# Patient Record
Sex: Female | Born: 1935 | Race: White | Hispanic: No | Marital: Married | Smoking: Former smoker
Health system: Southern US, Community
[De-identification: ages and names within clinical notes are randomized; demographics above are authoritative.]

## PROBLEM LIST (undated history)

## (undated) DIAGNOSIS — J302 Other seasonal allergic rhinitis: Secondary | ICD-10-CM

## (undated) DIAGNOSIS — Z9289 Personal history of other medical treatment: Secondary | ICD-10-CM

## (undated) DIAGNOSIS — F32A Depression, unspecified: Secondary | ICD-10-CM

## (undated) DIAGNOSIS — E785 Hyperlipidemia, unspecified: Secondary | ICD-10-CM

## (undated) DIAGNOSIS — I209 Angina pectoris, unspecified: Secondary | ICD-10-CM

## (undated) DIAGNOSIS — R002 Palpitations: Secondary | ICD-10-CM

## (undated) DIAGNOSIS — M25519 Pain in unspecified shoulder: Secondary | ICD-10-CM

## (undated) DIAGNOSIS — F419 Anxiety disorder, unspecified: Secondary | ICD-10-CM

## (undated) DIAGNOSIS — G8929 Other chronic pain: Secondary | ICD-10-CM

## (undated) DIAGNOSIS — F329 Major depressive disorder, single episode, unspecified: Secondary | ICD-10-CM

## (undated) DIAGNOSIS — I10 Essential (primary) hypertension: Secondary | ICD-10-CM

## (undated) DIAGNOSIS — R55 Syncope and collapse: Secondary | ICD-10-CM

## (undated) DIAGNOSIS — G309 Alzheimer's disease, unspecified: Secondary | ICD-10-CM

## (undated) DIAGNOSIS — S42309A Unspecified fracture of shaft of humerus, unspecified arm, initial encounter for closed fracture: Secondary | ICD-10-CM

## (undated) DIAGNOSIS — F0280 Dementia in other diseases classified elsewhere without behavioral disturbance: Secondary | ICD-10-CM

## (undated) DIAGNOSIS — R413 Other amnesia: Secondary | ICD-10-CM

## (undated) DIAGNOSIS — I509 Heart failure, unspecified: Secondary | ICD-10-CM

## (undated) DIAGNOSIS — M1990 Unspecified osteoarthritis, unspecified site: Secondary | ICD-10-CM

## (undated) DIAGNOSIS — N200 Calculus of kidney: Secondary | ICD-10-CM

## (undated) DIAGNOSIS — I499 Cardiac arrhythmia, unspecified: Secondary | ICD-10-CM

## (undated) DIAGNOSIS — Z7901 Long term (current) use of anticoagulants: Secondary | ICD-10-CM

## (undated) DIAGNOSIS — I4891 Unspecified atrial fibrillation: Secondary | ICD-10-CM

## (undated) DIAGNOSIS — R319 Hematuria, unspecified: Secondary | ICD-10-CM

## (undated) DIAGNOSIS — M8580 Other specified disorders of bone density and structure, unspecified site: Secondary | ICD-10-CM

## (undated) HISTORY — DX: Dementia in other diseases classified elsewhere without behavioral disturbance: F02.80

## (undated) HISTORY — DX: Other amnesia: R41.3

## (undated) HISTORY — PX: BLADDER SUSPENSION: SHX72

## (undated) HISTORY — DX: Long term (current) use of anticoagulants: Z79.01

## (undated) HISTORY — DX: Other chronic pain: G89.29

## (undated) HISTORY — DX: Unspecified atrial fibrillation: I48.91

## (undated) HISTORY — DX: Pain in unspecified shoulder: M25.519

## (undated) HISTORY — DX: Hyperlipidemia, unspecified: E78.5

## (undated) HISTORY — PX: URETERAL STENT PLACEMENT: SHX822

## (undated) HISTORY — DX: Palpitations: R00.2

## (undated) HISTORY — PX: APPENDECTOMY: SHX54

## (undated) HISTORY — DX: Alzheimer's disease, unspecified: G30.9

## (undated) HISTORY — DX: Depression, unspecified: F32.A

## (undated) HISTORY — DX: Major depressive disorder, single episode, unspecified: F32.9

---

## 1998-05-31 ENCOUNTER — Other Ambulatory Visit: Admission: RE | Admit: 1998-05-31 | Discharge: 1998-05-31 | Payer: Self-pay | Admitting: Cardiology

## 1998-09-27 ENCOUNTER — Ambulatory Visit (HOSPITAL_COMMUNITY): Admission: RE | Admit: 1998-09-27 | Discharge: 1998-09-27 | Payer: Self-pay | Admitting: Gastroenterology

## 1999-05-30 ENCOUNTER — Other Ambulatory Visit: Admission: RE | Admit: 1999-05-30 | Discharge: 1999-05-30 | Payer: Self-pay | Admitting: Obstetrics & Gynecology

## 1999-06-02 ENCOUNTER — Other Ambulatory Visit: Admission: RE | Admit: 1999-06-02 | Discharge: 1999-06-02 | Payer: Self-pay | Admitting: Urology

## 2000-07-20 HISTORY — PX: MENISCUS REPAIR: SHX5179

## 2000-07-29 ENCOUNTER — Ambulatory Visit (HOSPITAL_BASED_OUTPATIENT_CLINIC_OR_DEPARTMENT_OTHER): Admission: RE | Admit: 2000-07-29 | Discharge: 2000-07-29 | Payer: Self-pay | Admitting: Orthopedic Surgery

## 2000-08-30 ENCOUNTER — Other Ambulatory Visit: Admission: RE | Admit: 2000-08-30 | Discharge: 2000-08-30 | Payer: Self-pay | Admitting: Obstetrics & Gynecology

## 2001-10-03 ENCOUNTER — Encounter: Payer: Self-pay | Admitting: Obstetrics & Gynecology

## 2001-10-03 ENCOUNTER — Ambulatory Visit (HOSPITAL_COMMUNITY): Admission: RE | Admit: 2001-10-03 | Discharge: 2001-10-03 | Payer: Self-pay | Admitting: Obstetrics & Gynecology

## 2001-10-10 ENCOUNTER — Emergency Department (HOSPITAL_COMMUNITY): Admission: EM | Admit: 2001-10-10 | Discharge: 2001-10-10 | Payer: Self-pay | Admitting: *Deleted

## 2002-10-29 ENCOUNTER — Other Ambulatory Visit: Admission: RE | Admit: 2002-10-29 | Discharge: 2002-10-29 | Payer: Self-pay | Admitting: Obstetrics & Gynecology

## 2003-04-07 ENCOUNTER — Ambulatory Visit (HOSPITAL_COMMUNITY): Admission: RE | Admit: 2003-04-07 | Discharge: 2003-04-07 | Payer: Self-pay | Admitting: Family Medicine

## 2003-04-07 ENCOUNTER — Encounter: Payer: Self-pay | Admitting: Family Medicine

## 2004-11-19 HISTORY — PX: CARDIOVERSION: SHX1299

## 2005-01-24 ENCOUNTER — Encounter: Admission: RE | Admit: 2005-01-24 | Discharge: 2005-01-24 | Payer: Self-pay | Admitting: Cardiology

## 2005-02-02 ENCOUNTER — Ambulatory Visit (HOSPITAL_COMMUNITY): Admission: RE | Admit: 2005-02-02 | Discharge: 2005-02-02 | Payer: Self-pay | Admitting: Cardiology

## 2005-11-19 HISTORY — PX: OTHER SURGICAL HISTORY: SHX169

## 2007-05-12 ENCOUNTER — Encounter: Admission: RE | Admit: 2007-05-12 | Discharge: 2007-05-12 | Payer: Self-pay | Admitting: Urology

## 2007-05-26 ENCOUNTER — Ambulatory Visit (HOSPITAL_COMMUNITY): Admission: RE | Admit: 2007-05-26 | Discharge: 2007-05-26 | Payer: Self-pay | Admitting: Urology

## 2007-05-26 ENCOUNTER — Encounter (INDEPENDENT_AMBULATORY_CARE_PROVIDER_SITE_OTHER): Payer: Self-pay | Admitting: Diagnostic Radiology

## 2007-09-22 ENCOUNTER — Emergency Department (HOSPITAL_COMMUNITY): Admission: EM | Admit: 2007-09-22 | Discharge: 2007-09-23 | Payer: Self-pay | Admitting: Emergency Medicine

## 2007-10-20 HISTORY — PX: TOTAL SHOULDER ARTHROPLASTY: SHX126

## 2007-10-27 ENCOUNTER — Inpatient Hospital Stay (HOSPITAL_COMMUNITY): Admission: RE | Admit: 2007-10-27 | Discharge: 2007-11-06 | Payer: Self-pay | Admitting: Orthopedic Surgery

## 2007-10-30 ENCOUNTER — Ambulatory Visit: Payer: Self-pay | Admitting: Physical Medicine & Rehabilitation

## 2007-11-03 ENCOUNTER — Ambulatory Visit: Payer: Self-pay | Admitting: *Deleted

## 2007-11-03 ENCOUNTER — Encounter (INDEPENDENT_AMBULATORY_CARE_PROVIDER_SITE_OTHER): Payer: Self-pay | Admitting: Orthopedic Surgery

## 2007-11-20 ENCOUNTER — Observation Stay (HOSPITAL_COMMUNITY): Admission: EM | Admit: 2007-11-20 | Discharge: 2007-11-22 | Payer: Self-pay | Admitting: *Deleted

## 2008-02-05 ENCOUNTER — Emergency Department (HOSPITAL_COMMUNITY): Admission: EM | Admit: 2008-02-05 | Discharge: 2008-02-05 | Payer: Self-pay | Admitting: Emergency Medicine

## 2008-04-22 IMAGING — CR DG SHOULDER 2+V PORT*R*
1 series · 1 of 1 positions shown · non-contrast
Comparison: 09/22/07.

CLINICAL DATA: Fracture/
 PORTABLE RIGHT SHOULDER ? 1 VIEW ? 10/27/07:

[AP]
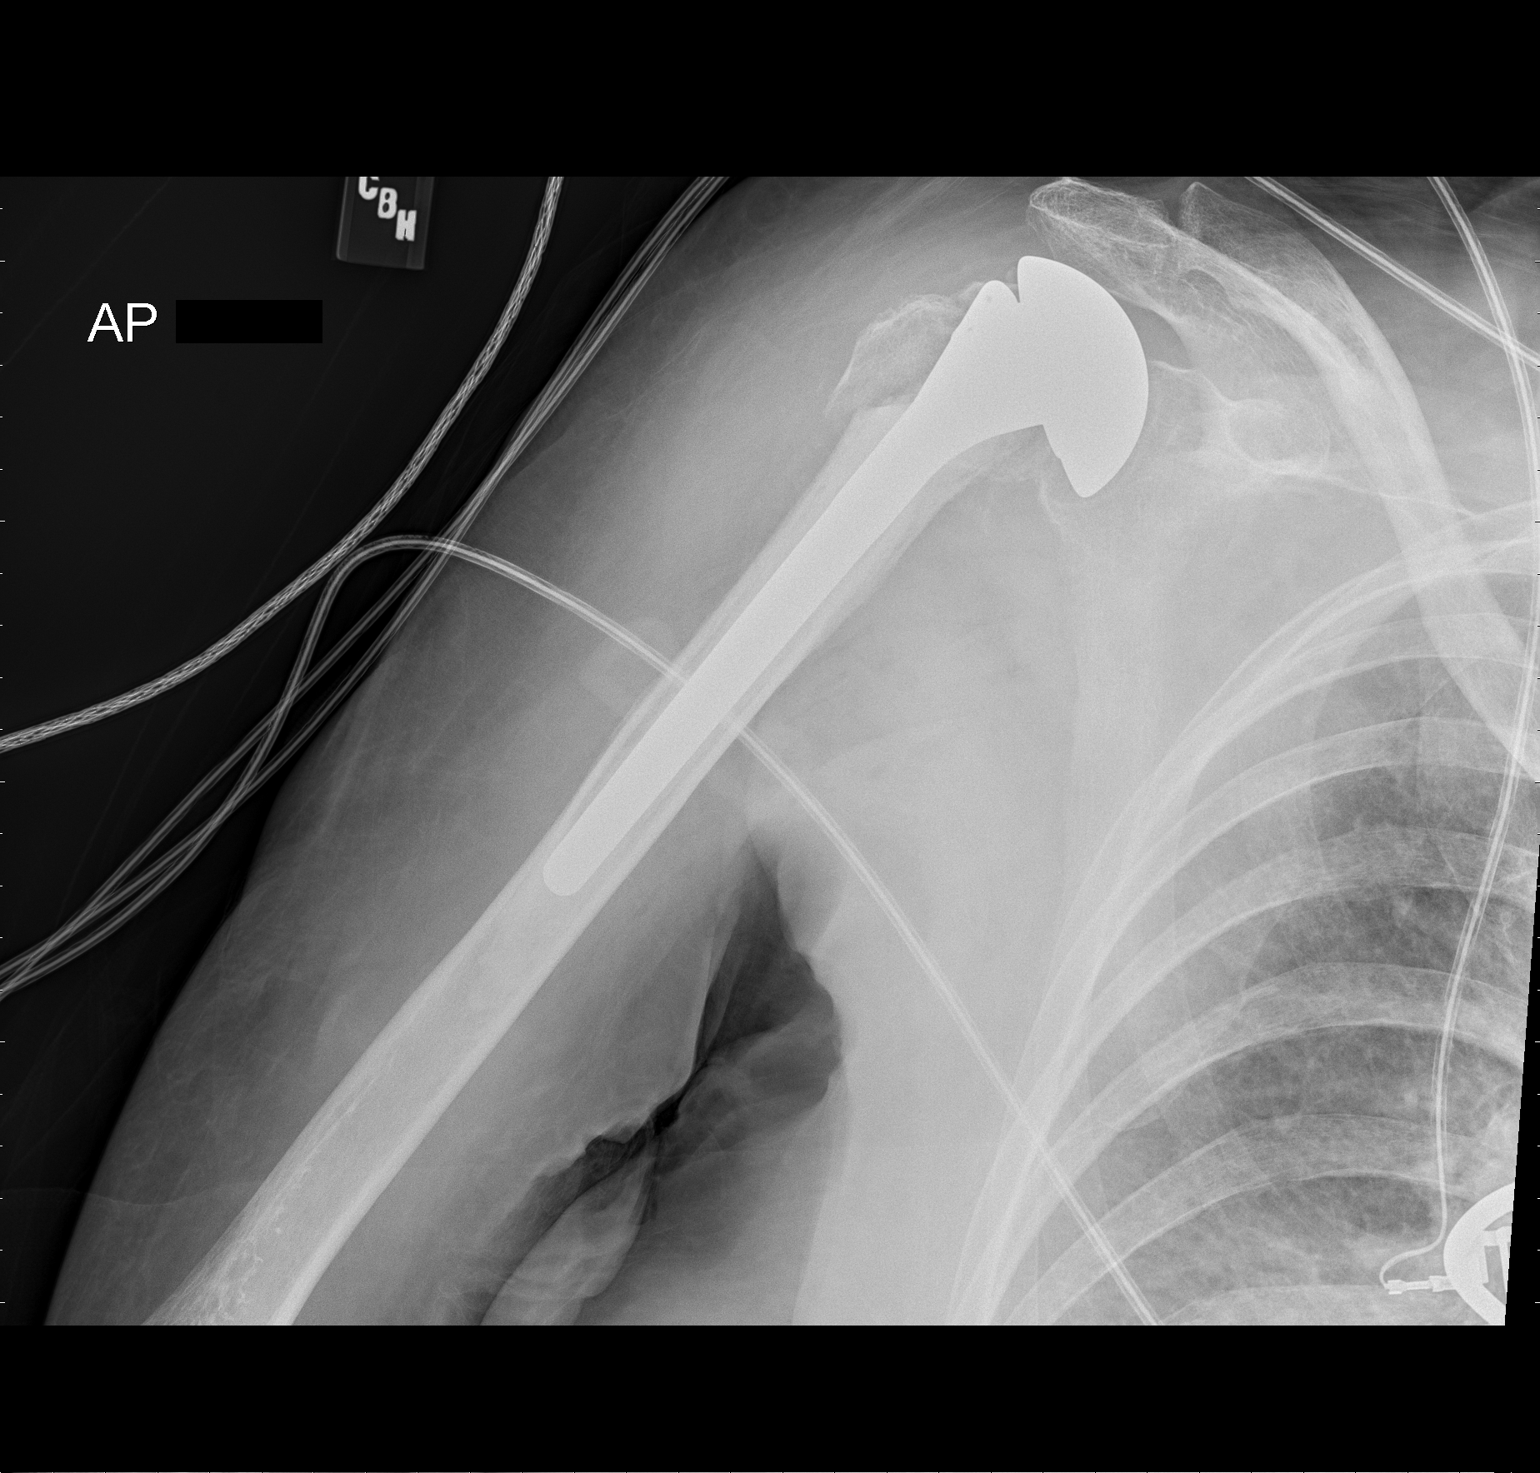

[1 of 1 positions shown; findings below may reference images not displayed]

FINDINGS: The patient has a new right shoulder arthroplasty.  No periprosthetic fracture is identified with fracture of the greater tuberosity again noted.  The device appears located on this AP view.  No evidence of post-procedural complication.
IMPRESSION: Right shoulder arthroplasty in place.

## 2009-11-19 HISTORY — PX: KNEE ARTHROSCOPY: SHX127

## 2010-06-28 ENCOUNTER — Ambulatory Visit: Payer: Self-pay | Admitting: Cardiology

## 2010-07-26 ENCOUNTER — Ambulatory Visit: Payer: Self-pay | Admitting: Cardiology

## 2010-08-28 ENCOUNTER — Ambulatory Visit: Payer: Self-pay | Admitting: Cardiology

## 2010-09-27 ENCOUNTER — Ambulatory Visit: Payer: Self-pay | Admitting: Cardiology

## 2010-10-26 ENCOUNTER — Ambulatory Visit: Payer: Self-pay | Admitting: Cardiology

## 2010-11-24 ENCOUNTER — Ambulatory Visit: Payer: Self-pay | Admitting: Cardiology

## 2010-12-21 ENCOUNTER — Encounter (INDEPENDENT_AMBULATORY_CARE_PROVIDER_SITE_OTHER): Payer: Medicare Other

## 2010-12-21 ENCOUNTER — Encounter: Payer: Self-pay | Admitting: Cardiology

## 2010-12-21 DIAGNOSIS — Z7901 Long term (current) use of anticoagulants: Secondary | ICD-10-CM

## 2010-12-21 DIAGNOSIS — I4891 Unspecified atrial fibrillation: Secondary | ICD-10-CM

## 2010-12-27 NOTE — Medication Information (Signed)
Summary: INR/2  Anticoagulant Therapy  Managed by: Bethena Midget, RN, BSN Referring MD: Patty Sermons MD Supervising MD: Jens Som MD, Arlys John Indication 1: Atrial Fibrillation Lab Used: [facility] [place] Site: [address] INR POC 3.0 INR RANGE 2.0-3.0  Dietary changes: no    Health status changes: no    Bleeding/hemorrhagic complications: no    Recent/future hospitalizations: no    Any changes in medication regimen? no    Recent/future dental: no  Any missed doses?: no       Is patient compliant with meds? yes      Comments: last INR at [facility] on 1/15 was 2.7. Pt takes 1 pill daily except 1/2 pill on 3 days.   Anticoagulation Management History:      The patient is taking warfarin and comes in today for a routine follow up visit.  Positive risk factors for bleeding include an age of 75 years or older.  The bleeding index is 'intermediate risk'.  Positive CHADS2 values include Age > 30 years old.  Anticoagulation responsible provider: Jens Som MD, Arlys John.  INR POC: 3.0.  Cuvette Lot#: [id number].  Exp: 11/2011.    Anticoagulation Management Assessment/Plan:      The next INR is due 01/18/2011.  Anticoagulation instructions were given to patient/daughter.  Results were reviewed/authorized by Bethena Midget, RN, BSN.  She was notified by Bethena Midget, RN, BSN.         Current Anticoagulation Instructions: INR 3.0 Continue 5mg s everyday except 2.5mg s on Tuesdays, Thursdays and Saturdays. Recheck in 4 weeks. Pt will call us if she wants to come here for checks or s/c at regular location at [facility]

## 2011-01-24 ENCOUNTER — Ambulatory Visit (INDEPENDENT_AMBULATORY_CARE_PROVIDER_SITE_OTHER): Payer: Medicare Other | Admitting: Cardiology

## 2011-01-24 DIAGNOSIS — Z7901 Long term (current) use of anticoagulants: Secondary | ICD-10-CM

## 2011-01-24 DIAGNOSIS — R079 Chest pain, unspecified: Secondary | ICD-10-CM

## 2011-01-24 DIAGNOSIS — I4891 Unspecified atrial fibrillation: Secondary | ICD-10-CM

## 2011-02-26 ENCOUNTER — Ambulatory Visit (INDEPENDENT_AMBULATORY_CARE_PROVIDER_SITE_OTHER): Payer: Medicare Other | Admitting: *Deleted

## 2011-02-26 DIAGNOSIS — I4891 Unspecified atrial fibrillation: Secondary | ICD-10-CM

## 2011-02-26 DIAGNOSIS — E7800 Pure hypercholesterolemia, unspecified: Secondary | ICD-10-CM

## 2011-02-26 DIAGNOSIS — Z7901 Long term (current) use of anticoagulants: Secondary | ICD-10-CM | POA: Insufficient documentation

## 2011-02-26 LAB — BASIC METABOLIC PANEL
BUN: 19 mg/dL (ref 6–23)
Creatinine, Ser: 0.9 mg/dL (ref 0.4–1.2)
GFR: 61.64 mL/min (ref >60.00)

## 2011-02-26 LAB — HEPATIC FUNCTION PANEL
ALT: 18 U/L (ref 0–35)
Albumin: 4.1 g/dL (ref 3.5–5.2)
Alkaline Phosphatase: 51 U/L (ref 39–117)
Total Protein: 6.7 g/dL (ref 6.0–8.3)

## 2011-02-26 LAB — LIPID PANEL
HDL: 74.2 mg/dL (ref >39.00)
LDL Cholesterol: 79 mg/dL (ref 0–99)
VLDL: 12.6 mg/dL (ref 0.0–40.0)

## 2011-02-28 ENCOUNTER — Telehealth: Payer: Self-pay | Admitting: *Deleted

## 2011-02-28 NOTE — Telephone Encounter (Signed)
Labs reported 

## 2011-03-09 ENCOUNTER — Ambulatory Visit (INDEPENDENT_AMBULATORY_CARE_PROVIDER_SITE_OTHER): Payer: Medicare Other | Admitting: *Deleted

## 2011-03-09 DIAGNOSIS — I4891 Unspecified atrial fibrillation: Secondary | ICD-10-CM

## 2011-03-09 DIAGNOSIS — Z7901 Long term (current) use of anticoagulants: Secondary | ICD-10-CM

## 2011-03-09 LAB — POCT INR: INR: 2.3

## 2011-03-22 ENCOUNTER — Encounter: Payer: Self-pay | Admitting: *Deleted

## 2011-03-23 ENCOUNTER — Encounter: Payer: Self-pay | Admitting: Cardiology

## 2011-03-23 ENCOUNTER — Ambulatory Visit (INDEPENDENT_AMBULATORY_CARE_PROVIDER_SITE_OTHER): Payer: Medicare Other | Admitting: Cardiology

## 2011-03-23 ENCOUNTER — Ambulatory Visit (INDEPENDENT_AMBULATORY_CARE_PROVIDER_SITE_OTHER): Payer: Medicare Other | Admitting: *Deleted

## 2011-03-23 VITALS — BP 112/70 | HR 80 | Wt 149.0 lb

## 2011-03-23 DIAGNOSIS — E7800 Pure hypercholesterolemia, unspecified: Secondary | ICD-10-CM | POA: Insufficient documentation

## 2011-03-23 DIAGNOSIS — I4510 Unspecified right bundle-branch block: Secondary | ICD-10-CM

## 2011-03-23 DIAGNOSIS — I4891 Unspecified atrial fibrillation: Secondary | ICD-10-CM

## 2011-03-23 DIAGNOSIS — R5381 Other malaise: Secondary | ICD-10-CM

## 2011-03-23 DIAGNOSIS — R5383 Other fatigue: Secondary | ICD-10-CM | POA: Insufficient documentation

## 2011-03-23 DIAGNOSIS — Z7901 Long term (current) use of anticoagulants: Secondary | ICD-10-CM

## 2011-03-23 LAB — CBC WITH DIFFERENTIAL/PLATELET
Eosinophils Absolute: 0.1 10*3/uL (ref 0.0–0.7)
Hemoglobin: 14.4 g/dL (ref 12.0–15.0)
Lymphocytes Relative: 28.9 % (ref 12.0–46.0)
Lymphs Abs: 1.4 10*3/uL (ref 0.7–4.0)
Monocytes Absolute: 0.5 10*3/uL (ref 0.1–1.0)
Monocytes Relative: 9.8 % (ref 3.0–12.0)
Neutro Abs: 2.9 10*3/uL (ref 1.4–7.7)
Neutrophils Relative %: 59.4 % (ref 43.0–77.0)
Platelets: 141 10*3/uL — ABNORMAL LOW (ref 150.0–400.0)
RBC: 4.62 Mil/uL (ref 3.87–5.11)
WBC: 4.9 10*3/uL (ref 4.5–10.5)

## 2011-03-23 LAB — TSH: TSH: 2.26 u[IU]/mL (ref 0.35–5.50)

## 2011-03-23 NOTE — Progress Notes (Signed)
Penny [name] Date of Birth:  [birth year] [facility] [address]   [address] [place], [place]  [address] [phone]           Fax   [phone]  History of Present Illness: This pleasant 77 year old woman is seen for a scheduled followup office visit.  We actually move her appointment up early because she was concerned about her symptoms of malaise and fatigue.  She has not had any localizing cause for the malaise and fatigue.  Her recent blood chemistries were okay but we did not check thyroid or CBC at that time and we will check those today for completeness.  She does not have a history of ischemic heart disease and she had a normal adenosine nuclear stress test on 01/28/08.  She had a two-dimensional echocardiogram on 04/13/09 which showed normal left ventricular systolic function with an ejection fraction of 55-60% and biatrial enlargement and mild aortic sclerosis and mild mitral regurgitation and moderate tricuspid regurgitation with normal pulmonary artery pressure.  Current Outpatient Prescriptions  Medication Sig Dispense Refill  . alendronate (FOSAMAX) 70 MG tablet Take 70 mg by mouth every 7 (seven) days. Take with a full glass of water on an empty stomach.       . Ascorbic Acid (VITAMIN C) 500 MG tablet Take 500 mg by mouth daily.        . calcium carbonate (OS-CAL) 600 MG TABS Take 600 mg by mouth 2 (two) times daily with a meal.        . Cholecalciferol (VITAMIN D) 2000 UNITS tablet Take 4,000 Units by mouth daily.       . diazepam (VALIUM) 5 MG tablet Take 5 mg by mouth as needed.        . meclizine (ANTIVERT) 25 MG tablet Take 25 mg by mouth as needed.        . metoprolol succinate (TOPROL-XL) 25 MG 24 hr tablet Take 12.5 mg by mouth daily.        . multivitamin (THERAGRAN) per tablet Take 1 tablet by mouth daily.        . Omega-3 Fatty Acids (FISH OIL PO) Take 400 mg by mouth daily.        [name] warfarin (COUMADIN) 5 MG tablet Take 5 mg by mouth. As  directed by the [facility]       . rosuvastatin (CRESTOR) 10 MG tablet Take 5 mg by mouth daily.          Allergies  Allergen Reactions  . Digoxin And Related   . Zocor (Simvastatin)   . Sulfa Antibiotics Rash    Patient Active Problem List  Diagnoses  . Atrial fibrillation  . Long term current use of anticoagulant  . Malaise and fatigue  . Right bundle branch block  . Hypercholesterolemia    History  Smoking status  . Former Smoker  Smokeless tobacco  . Not on file    History  Alcohol Use No    Family History  Problem Relation Age of Onset  . Heart disease Mother     Review of Systems: Constitutional: no fever chills diaphoresis or fatigue or change in weight.  Head and neck: no hearing loss, no epistaxis, no photophobia or visual disturbance. Respiratory: No cough, shortness of breath or wheezing. Cardiovascular: No chest pain peripheral edema, palpitations. Gastrointestinal: No abdominal distention, no abdominal pain, no change in bowel habits hematochezia or melena. Genitourinary: No dysuria, no frequency, no urgency, no nocturia.  Musculoskeletal:No arthralgias, no back pain, no gait disturbance or myalgias. Neurological: No dizziness, no headaches, no numbness, no seizures, no syncope, no weakness, no tremors. Hematologic: No lymphadenopathy, no easy bruising. Psychiatric: No confusion, no hallucinations, no sleep disturbance.    Physical Exam: Filed Vitals:   03/23/11 1103  BP: 112/70  Pulse: 80  The general appearance reveals a pleasant elderly woman in no acute distress.Pupils equal and reactive.   Extraocular Movements are full.  There is no scleral icterus.  The mouth and pharynx are normal.  The neck is supple.  The carotids reveal no bruits.  The jugular venous pressure is normal.  The thyroid is not enlarged.  There is no lymphadenopathy.The chest is clear to percussion and auscultation. There are no rales or rhonchi. Expansion of the chest is  symmetrical.  Heart reveals a soft apical systolic murmur.  The rhythm is irregular in atrial fibrillation.The abdomen is soft and nontender. Bowel sounds are normal. The liver and spleen are not enlarged. There Are no abdominal masses. There are no bruits.The pedal pulses are good.  There is no phlebitis or edema.  There is no cyanosis or clubbing.Strength is normal and symmetrical in all extremities.  There is no lateralizing weakness.  There are no sensory deficits.No muscle weakness or atrophy.  No muscle tenderness.The skin is warm and dry.  There is no rash.  EKG shows atrial fibrillation with controlled ventricular response and an old right bundle-branch block.  There are no ischemic changes.  Assessment / Plan: Continue present medication.  Await results of lab work.She will return in one month and in 2 months for protime and in 3 months for office visit and protime.

## 2011-03-23 NOTE — Assessment & Plan Note (Signed)
This patient has a long history of atrial fibrillation and is on Coumadin.  She has not been having any side effects from the Coumadin.  Her INR today is 2.2.  She has had some Exertional dyspnea and occasional left-sidedChest pain.  She does not have known coronary disease.  She had a normal nuclear stress test on 01/28/08.

## 2011-03-23 NOTE — Assessment & Plan Note (Signed)
The patient has been having leg cramps down around her ankle is bilaterally.  At the present time she is off her Crestor to see if the leg cramps will improve.  She has been off the Crestor for several weeks and so for there's been no real change.

## 2011-03-23 NOTE — Assessment & Plan Note (Signed)
Patient complains of lack of energy.  She feels tired and exhausted.  She has not been aware of any hematochezia or melena or anything to suggest anemia.  She's not having any fever or chills or night sweats per she does have occasional hot flashes.

## 2011-03-27 ENCOUNTER — Telehealth: Payer: Self-pay | Admitting: *Deleted

## 2011-03-27 NOTE — Telephone Encounter (Signed)
Lab results reported.

## 2011-04-03 NOTE — Op Note (Signed)
Penny [name], RAAP NO.:  [id number]   MEDICAL RECORD NO.:  [id number]          PATIENT TYPE:  INP   LOCATION:  5008                         FACILITY:  [facility]   PHYSICIAN:  Almedia Balls. [name], M.D. DATE OF BIRTH:  1929/07/24   DATE OF PROCEDURE:  10/27/2007  DATE OF DISCHARGE:                               OPERATIVE REPORT   PREOPERATIVE DIAGNOSIS:  Right shoulder displaced proximal humerus  fracture.   POSTOPERATIVE DIAGNOSES:  1. Right shoulder displaced proximal humerus fracture.  2. Chronic rotator cuff tear.   PROCEDURE PERFORMED:  Right shoulder hemiarthroplasty using DePuy Global  FX system with right shoulder rotator cuff repair.   ATTENDING SURGEON:  Penny [name], M.D.   ASSISTANT:  Konrad Felix Dixon, [place]   ANESTHESIA:  General anesthesia plus interscalene block anesthesia was  used.   ESTIMATED BLOOD LOSS:  200 mL.   FLUID REPLACEMENT:  1200 mL crystalloid and 500 mL Hespan.   INSTRUMENT COUNTS:  Correct.   COMPLICATIONS:  None.   ANTIBIOTICS:  Perioperative antibiotics were given.   INDICATIONS:  The patient is a 77 year old female with a history of fall  injuring her right shoulder.  The patient was injured about a month ago.  The patient initially had acceptable reduction of her proximal humerus  fracture.  The patient's over successive weeks lost reduction on the  proximal humerus, presents now with a displaced fracture requiring  operative reconstruction of her right shoulder having discussed with the  patient the need to reconstitute her proximal humeral anatomy of the  right shoulder.  She was consented for either an open reduction internal  fixation utilizing a nail plate device or a right shoulder  hemiarthroplasty.  Informed consent was obtained.   DESCRIPTION OF PROCEDURE:  After an adequate level of anesthesia  achieved, the patient was positioned in the modified beach-chair  position.  Grossly the patient's right shoulder  showed displacement of  her fracture with the humeral shaft anterior and tenting the skin.  Unfortunately even with the patient asleep, it appeared that this was a  fixed deformity and could not be manipulated with the patient asleep and  paralyzed.  It felt as if everything was moving as a unit.  Thus this  was likely healed at this point.  We then sterilely prepped and draped  the right shoulder to the wrist.  After sterile prep and drape we  entered the shoulder through an anterior deltopectoral approach.  We  started at the coracoid process and extended distally to the anterior  humeral shaft using a 10 blade scalpel.  Dissection carried sharply down  through subcutaneous tissue.  Cephalic vein was identified and taken  laterally with the deltoid muscle.  The pectoralis was taken medially.  The upper 0.5 cm pectoralis was released.  We encountered the anterior  humeral shaft, almost immediately and had to peel the deltoid off that  shaft as it had healed there.  There was also attritional ruptured noted  of the biceps tendon.  There was quite a bit of scarring and bleeding  noted during the approach.  Bleeding was controlled with Bovie  electrocautery.  The cephalic vein had also been scarred as well to the  distal humeral fragment.  During the removal of the deltoid off of the  humeral shaft, there was a fairly large branch of the cephalic vein that  was compromised and we had to go ahead and suture ligate the cephalic  vein proximally and distally.  At this point we took the conjoined  tendon medially.  We identified the proximal humeral fragment was healed  to the backside of the humeral shaft.  This had to be osteotomized using  a Cobb elevator and it was noted that following the osteotomy process,  the bone was quite soft and the shell of bone remaining for the proximal  humerus appeared to be quite thin.  My concern was that the purchase  from the SMP DePuy nail plate would be  poor in the proximal fragment  given the patient's 77 years of age and one month chronicity with this  injury it was felt that open reduction internal fixation would not be a  good option for [place] and we elected to proceed at this time with  hemiarthroplasty.  At this point we went ahead and identified the  proximal portion the biceps that had been attritionally torn and we  divided the soft tissue overlying the biceps groove.  We went ahead and  then osteotomized the lesser tuberosity off and then identified the  humeral head which was flipped 108 degrees and angled posteriorly,  placed a Crego elevator over the back of the articular cartilage to  identify the rotator cuff tendon insertion.  There appeared to be a  fairly large longitudinal split rotator cuff tear with some loss of  tendon in the supraspinatus area.  The infraspinatus and teres minor  appeared to be well attached to the posterior portion of the greater  tuberosity.  We went ahead and osteotomized the greater tuberosity.  We  basically had two pieces.  We had a small anterior piece with a split  longitudinal tear which was chronic in nature and then we had the  posterior piece with the infraspinatus and teres minor attached and went  ahead and performed a side-to-side rotator cuff repair using a #2  FiberWire suture effectively creating one large greater tuberosity piece  and rotator cuff tendon attachment.  We removed the humeral head and  sized to a size 44.  At this point we had a good look at the glenoid  face which appeared to be in good shape.  There was a posterior labral  tear which was fairly extensive and we went ahead and debrided that  sharply using a needle point Bovie careful to stay away from the  articular cartilage.  We then went ahead and did a biceps tenodesis of  the remaining 1 cm or so of biceps which was left attached to the  superior labrum.  Again this appears to have essentially torn because of   its lying over the top of the sharp end of the fractured humerus.  With  the glenoid in good shape and the remaining labrum stable, we went ahead  and looked for some extra posterior bone fragments which were found  attached to the posterior capsule, removed those sharply using Bovie and  at this point were ready to prepare the humerus.  We went ahead and used  hand reaming and reamed up to a size 10 with the Global FX system.  I  stuck the 12 reamer in.  This would not go down more than half way.  Thus we selected the 10 body and 44 x 18 head and I went ahead and  inserted that in the humeral shaft.  The patient felt fairly short with  contracture of the soft tissues but we were able to identify the  appropriate height for the humeral prosthesis which was going to be  three laser marks to the biceps groove bone with the anterior fin line  with that biceps groove.  Once we had done that we removed trial  components.  We did go ahead and irrigate the humeral shaft and placed a  dried sponge in the humeral shaft.  We next went ahead and selected our  real components and then placed total of three drill holes in the  humeral shaft.  One in the biceps floor the biceps groove and one medial  and one lateral to the biceps groove and placed two #2 FiberWire sutures  into this hole such that we had four suture strands coming out through  the holes in a mattress suture inside the humeral shaft.  These will be  used for humeral shaft to the tuberosity fixation.  We then placed  another suture around the prosthesis through the medial fin and then  went ahead and mixed our cement.  This was vacuum mixed DePuy one cement  and it was then mixed finally in the bowl and then rolled into a doughy  consistency and then placed down by hand the humeral shaft and then  packed in to pressurize.  We then inserted the humeral component with  the anterior fin aligned with the biceps groove to the third laser mark   and allowed the cement to harden.  Once this was hardened, we went ahead  and attached our humeral head component and impacted that into place.  This was 44 x 18 head and at this point we went ahead and placed our  available bone graft from the humeral head around the proximal humerus  both medial and lateral to the fin.  We placed the around-the-world  suture.  It was through the medial fin of the prosthesis, posterior to  the greater tuberosity and medial to the lesser tuberosity.  We had two  mattress sutures with modified W stitches in the lesser and greater  tuberosities respectively.  These were with the suture strands coming  out over the top of the bone portion, the tuberosity portion and the  sutures through the tendon.  We then repaired tuberosity to tuberosity  with the sutures bringing the tuberosities over the top of the bone  graft area.  We then went ahead and at this point tied our around-the-  world stitch and we placed our mattress sutures that were through the  shaft of the humerus we brought those sutures up through the biceps to  tenodese that and then also placed those sutures using a free needle  through the rotator cuff tendon and the subscap tendon.  We oversewed  the most distal portion of the rotator interval right where the  supraspinatus and subscapularis intersected to further stabilize the  area.  Once we had completed our repair, the entire tuberosity shaft  head construct moved as a unit with no impingement.  We thoroughly  irrigated and then closed with a layered closure using Vicryl and  subcuticular Monocryl.  Steri-Strips applied followed by a sterile  dressing.  The patient placed in  shoulder sling, having tolerated  surgery well.      Almedia Balls. [name], M.D.  Electronically Signed     SRN/MEDQ  D:  10/29/2007  T:  10/30/2007  Job:  [id number]   cc:   Molly Maduro A. Thurston Hole, M.D.

## 2011-04-03 NOTE — Discharge Summary (Signed)
Penny [name], Penny [name] NO.:  [id number]   MEDICAL RECORD NO.:  [id number]          PATIENT TYPE:  INP   LOCATION:  5008                         FACILITY:  [facility]   PHYSICIAN:  Almedia Balls. [name], M.D. DATE OF BIRTH:  1929-08-22   DATE OF ADMISSION:  10/27/2007  DATE OF DISCHARGE:  11/03/2007                               DISCHARGE SUMMARY   ADMISSION DIAGNOSIS:  Right proximal humerus fracture.   DISCHARGE DIAGNOSIS:  Right proximal humerus fracture status post  hemiarthroplasty, RSD, subtle wound infection to right elbow bursa.   BRIEF HISTORY:  The patient is a 77 year old female who sustained a fall  about 3-4 weeks prior to admission.  I was trying to treat her proximal  humerus fracture conservatively, but she had worsening symptoms and thus  elected to have surgical management.   PROCEDURE:  The patient had a right shoulder hemiarthroplasty on  October 27, 2007 by Dr. Malon Kindle.  Assistant was [employer].  No complications.  Estimated blood loss was minimal.   HOSPITAL COURSE:  The patient was admitted on October 27, 2007 for the  above-stated procedure which she tolerated well.  After adequate time in  the post-anesthesia care unit, she was transferred up to 5000.  The  patient was complaining about moderate pain with swelling to the right  upper extremity.  It was noted that she basically had some increase in  swelling and some signs of probable early RSD to that right upper  extremity.  We did discuss this was Dr. Oneita Kras, who is well  known for his treatment for RSD in the community, and he does agree that  this was going on prior to her admission and had been going on for  probably several weeks before surgery.  Due to her Coumadin, currently  she is not a candidate for stellate blocks and we are trying to treat it  with clonidine.  Unfortunately, clonidine is lower her blood pressure,  causing difficulty managing the signs and  symptoms.  The patient was  very slow to go with physical therapy.  She was able to complete some  activities, but thus a rehab bed was initiated for transfer.  The  patient and her family were in agreement with this plan.  She was kept  over the weekend for continued PT and OT, and to hopefully to get her to  a rehab facility today on November 03, 2007.  Currently, she is stable  and labs were on labs were within normal limits.   DISCHARGE/PLAN:  The patient will be transferred to rehab or a short-  term skilled nursing facility on November 03, 2007.   CONDITION:  Her condition is guarded.   DIET:  Regular.   ALLERGIES:  SULFA.   DISCHARGE MEDICATIONS:  1. Amiodarone 200 mg p.o. daily,  2. Ascorbic acid 250 mg p.o. daily.  3. Clonidine 0.1 mg 24-hour patch, but we are holding currently,  4. Lexapro 10 mg p.o. daily.  5. Ferrous sulfate 325 mg p.o. t.i.d.  6. Hydrocortisone topical applied t.i.d. to affected area.  7. Robaxin 500 mg p.o. q.8h.  8. Toprol 25 mg p.o. daily.  9. Zocor 20 mg p.o. daily.  10.Coumadin.  11.Valium 10 mg p.o. q.h.s. p.r.n.  12.Vicodin 1-2 tabs q.4-6h. p.r.n. pain.   FOLLOWUP:  The patient will follow back up with Dr. Malon Kindle in 2  weeks.      Thomas B. Durwin Nora, P.A.      Almedia Balls. [name], M.D.  Electronically Signed    TBD/MEDQ  D:  11/03/2007  T:  11/03/2007  Job:  [id number]

## 2011-04-03 NOTE — Consult Note (Signed)
NAME:  Penny [name], CLAVEL NO.:  [id number]   MEDICAL RECORD NO.:  [id number]          PATIENT TYPE:  INP   LOCATION:  2012                         FACILITY:  [facility]   PHYSICIAN:  Kari Baars, M.D.  DATE OF BIRTH:  1929-11-02   DATE OF CONSULTATION:  DATE OF DISCHARGE:                                 CONSULTATION   REFERRING PHYSICIANS:  Georga Hacking, M.D. and Cassell Clement,  M.D.   REASON FOR CONSULTATION:  Syncope with multiple new medications.  Please  assist with medications.   HISTORY OF PRESENT ILLNESS:  The patient is a 77 year old white female  with a history of atrial fibrillation on chronic anticoagulation, recent  nephrolithiasis treatment, and recent right shoulder fracture who  presented to the emergency department after slumping down while sitting  in a chair with loss of consciousness.  The patient has had a  complicated history recently including treatment for nephrolithiasis in  August 2008 at [facility].  At that  time she underwent bilateral ureteral stents which were removed 2 weeks  later.  She also had lithotripsy.  She had some postoperative fatigue  but recovered well from that until she fell at the beginning of December  and suffered a right shoulder fracture.  She states that she stripped on  the blacktop and had a mechanical fall and had right proximal humeral  fracture in 4 places.  This was treated conservatively initially by Dr.  Thurston Hole with persistent pain and development of RSV.  She was referred to  Dr. [name] for evaluation and underwent right hemiarthroplasty of the  EMS on October 27, 2007.  Postoperatively, she had excessive sedation  and difficulty awakening from the anesthesia.  Her hospitalization was  prolonged and complicated by a right olecranon pressure ulcer.  This was  treated with antibiotics.  She was discharged home and has been living  with her daughters.  She has been  more lethargic since discharge but has  been doing okay at home, eating and drinking okay.  Yesterday while  sitting in a chair, she slumped over and lost consciousness for about 2  minutes.  This was a witnessed event with no apparent seizure activity  or urinary or bowel incontinence.  Her daughter states that she may have  had some drooping of her left face.  She checked her pulse at that time  and it was irregular as it always is with rate in the 50s or 60s.   She awoke afterwards and was lethargic plus had improved overnight.  She  has had multiple recent medication changes including a decreased dose of  Toprol during her hospitalization.  Digoxin was recently added as well.  She has been on Neurontin for her right arm RSV which has been titrated  up to 300 mg t.i.d.  Her daughter states that she had a similar episode  last week at her other daughter's house which may have occurred right  after taking Neurontin.  In addition, she has had right upper extremity  sympathetic block on 2 occasions over the  past 3 weeks.  Her Coumadin  has been on hold for this procedure and she has been receiving Lovenox.   REVIEW OF SYSTEMS:  All systems reviewed on the patient are negative  except in the history of present illness the following exceptions:  Mild  headache.  No urinary symptoms, fever, chills or sweats, diarrhea.  Appetite is okay.  Appears no chest pain.   PAST MEDICAL HISTORY:  1. Atrial fibrillation with anticoagulation.  2. Nephrolithiasis status post bilateral ureteral stents (August 2008)      removed after 2 weeks.  3. Status post lithotripsy in August 2008 at [facility].  4. Hyperlipidemia.  5. Recent right proximal humeral fracture (December 2008) status post      right hemiarthroplasty (October 27, 2007) and right upper extremity      sympathetic block x2 in the past 3 weeks due to Reflex Sympathetic      Dystrophy .  6. Right olecranon pressure ulcer.  7.  Renal mass.  8. Postoperative anemia undergoing transfusion.  9. Status post total abdominal hysterectomy, bilateral salpingo-      oophorectomy in 1997.  10.Status post apneic events.  11.Status post knee surgery in 25-Apr-2000.   MEDICATIONS:  1. Amiodarone 200 mg, recently titrated to 100 mg daily.  2. Ascorbic acid 250 mg daily.  3. Lexapro 10 mg daily.  4. Toprol XL recently decreased from 25 to 12.5 mg daily.  5. Zocor 20 mg daily.  6. Coumadin on hold.  7. Lovenox 40 mg b.i.d..  8. Neurontin 300 mg t.i.d. recently increased.  9. Digoxin 0.125 mg daily recently started.   ALLERGIES/>  NO KNOWN DRUG ALLERGIES.   SOCIAL HISTORY:  She is married with 3 children and 5 grandchildren.  She is cared for by her daughter with whom she is staying.  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   FAMILY HISTORY:  Father died following a fall in 04/25/92.  Mother died of  heart disease in her 33s.   PHYSICAL EXAMINATION:  VITAL SIGNS:  Temperature 97.3, pulse 77,  respirations 18, oxygen saturation 96% on room air, blood pressure  95/66.  GENERAL:  She is fatigued, pale appearing in no acute distress.  HEENT:  Oropharynx is moist.  NECK:  Supple without lymphadenopathy, jugular venous distension or  carotid bruits.  HEART:  Irregularly irregular with no murmurs.  LUNGS:  Clear to auscultation bilaterally.  ABDOMEN:  Soft, nondistended, nontender with normoactive bowel sounds.  EXTREMITIES:  Right arm is in a sling with some mild acrocyanosis of the  hand.  She does have right hand tremor at rest.  Her right ankle is in a  brace.  She has a healed ulcer over her right olecranon bursa with  crusting and no erythema.  NEUROLOGIC:  No focal deficits.  Cranial nerves are intact.  No facial  droops are seen.  Right hand tremors as above.   LABORATORY STUDIES:  A CBC shows a white count of 6.3, hemoglobin 13.9,  platelets 169,000.  BMET significant for  sodium 141, calcium 4.2,  chloride 108, bicarbonate 22, BUN 10, creatinine 0.8, glucose 97.  Liver  function tests are normal.  TSH 2.9.  INR 1.  Also her Coumadin and  digoxin levels are 0.5 and urinalysis does show positive nitrites.   STUDIES:  Head CT shows atrophy with chronic microvascular changes and  no definite  acute abnormality .  The EKG shows atrial  fibrillation with  right bundle branch block, left anterior fascicular block (bifascicular  block) .   ASSESSMENT/FINDINGS:  __________ .      Kari Baars, M.D.  Electronically Signed     WS/MEDQ  D:  11/21/2007  T:  11/21/2007  Job:  [id number]   cc:   Cassell Clement, M.D.

## 2011-04-03 NOTE — Discharge Summary (Signed)
NAMEROSALITA, Penny [name] NO.:  [id number]   MEDICAL RECORD NO.:  [id number]          PATIENT TYPE:  INP   LOCATION:  2012                         FACILITY:  [facility]   PHYSICIAN:  Georga Hacking, M.D.DATE OF BIRTH:  1929-09-25   DATE OF ADMISSION:  11/20/2007  DATE OF DISCHARGE:  11/22/2007                               DISCHARGE SUMMARY   FINAL DIAGNOSES:  1. Unresponsiveness, probably medication related.      a.     Neurontin discontinued this admission.  2. Atrial fibrillation with slow response - amiodarone and Lanoxin      discontinued this admission.  3. Possible urinary tract infection treated with Cipro.  4. Recent shoulder injury with reflex sympathetic dystrophy.  5. Chronic anticoagulation therapy, currently on Lovenox.  6. History of kidney stones.  7. Hyperlipidemia under treatment.   CONSULTATIONS:  Kari Baars, M.D.   HISTORY:  A 77 year old female who has previous chronic atrial  fibrillation under rate control.  She had a right shoulder fracture  repaired surgically recently and has developed a reflex sympathetic  dystrophy.  She has been taken off of Coumadin and placed on Lovenox and  has been receiving sympathetic injections by Dr. Oneita Kras.  She  has also been started on Neurontin and has had this titrated up.  She  became more lethargic recently, has been staying between her two  daughters including one daughter who is a Company secretary.  She became  unresponsive today after taking her medications, was sitting and slumped  over and was assisted to the floor by her daughter.  She was  unresponsive and unable to speak.  Daughter did check her pulse and felt  her pulse was regular at the time but was unable to check blood pressure  and called 9-1-1.  She gradually became responsive but was still  lethargic when she was seen in the emergency room.  She denied any other  cardiac symptoms.  Please see the previously dictated history and  physical for remainder of the details.   HOSPITAL COURSE:  Laboratory data on admission showed a normal CBC,  normal PT and normal PTT.  D-dimer was 0.76 which was mildly high.  Sodium 141, potassium 4.2, chloride 108, CO2 22, glucose 97, BUN 10,  creatinine 0.79.  Cardiac enzymes were normal.  TSH was 0.9.  EKG showed  atrial fibrillation with controlled response.  Head CT was unremarkable.   The patient was admitted and she had no evidence of orthostasis.  Her  Neurontin was discontinued.  Dr. Clelia Croft saw her in consultation, wondered  whether she might have a urinary tract infection and she had multiple  organisms and treated with Cipro because of a recent urologic procedure.  He also suspected polypharmacy as a cause.  Dr. Elease Hashimoto saw her in Dr.  Yevonne Pax absence the next morning and stopped amiodarone.  She  continued to have a somewhat slow response and also stopped her digoxin.  She was seen by PT and OT who felt that she did not need further  inpatient services and she was discharged in  improved condition.  She is  to stop her Neurontin and digoxin.  She is also to not take pain  medicines or Valium on a regular basis, but may use them as needed.   DISCHARGE MEDICATIONS:  Her medications on discharge include extended  release metoprolol 25 mg daily, Lexapro 10 mg daily, Lovenox 40 mg twice  daily, Valium 5 mg as needed, hydrocodone as needed.   FOLLOWUP:  She is instructed to call Dr. Patty Sermons for followup in 1-2  weeks.  She is to see Dr. Clelia Croft in 2 weeks.  She is to take Cipro 500 mg  twice daily to complete a 3 day course.      Georga Hacking, M.D.  Electronically Signed     WST/MEDQ  D:  11/22/2007  T:  11/22/2007  Job:  [id number]   cc:   Cassell Clement, M.D.  Kari Baars, M.D.  Almedia Balls. [name], M.D.

## 2011-04-03 NOTE — H&P (Signed)
Penny [name], DULA NO.:  [id number]   MEDICAL RECORD NO.:  [id number]          PATIENT TYPE:  INP   LOCATION:  1825                         FACILITY:  [facility]   PHYSICIAN:  Georga Hacking, M.D.DATE OF BIRTH:  Oct 22, 1929   DATE OF ADMISSION:  11/20/2007  DATE OF DISCHARGE:                              HISTORY & PHYSICAL   REASON FOR ADMISSION:  Unresponsive.   HISTORY:  This 77 year old female has a previous history of chronic  atrial fibrillation.  She has a history of having had a cardioversion  several years ago, but went back into atrial fibrillation this summer.  She has been maintained on chronic atrial fibrillation with rate control  on Coumadin.  She had a right shoulder fracture and recently had repair  of the right shoulder by Dr. Malon Kindle.  She evidently had  significant lethargy following this and was slow to wake following  surgery and required a prolonged hospital stay.  She has evidently  developed reflex sympathetic dystrophy and has been treated by Dr. Oneita Kras.  She has been taken off Coumadin and been maintained on  Lovenox.  She has recently received sympathetic injections and had a  previous injection done just this past Tuesday.  She has been somewhat  more lethargic recently.  She has been staying between her two daughters  including one daughter, Augusto Gamble, who is a Company secretary.   She became unresponsive today after taking her medicines this morning  and she was sitting and then slumped over, and was assisted to the floor  by her daughter.  She was unresponsive, unable to speak.  Daughter did  check her pulse and felt that her pulse was regular at the time, was  unable to check the blood pressure, called 911.  Over a period of  several minutes, the patient gradually became responsive, but is still  somewhat lethargic.  She had recently been started on Neurontin the past  2 weeks and has had escalating doses of it.  She has never  had syncope  previously.  When she broke her shoulder, she tripped and fell on a  curb.  She has no history of coronary artery disease or chest pain and  denies any shortness of breath.   PAST HISTORY:  Negative for ulcers, diabetes or hypertension.  She does  have a previous history of hyperlipidemia that is under treatment  currently.   PAST SURGICAL HISTORY:  She has had a right total knee replacement,  hysterectomy.  She has had bladder tack up and has also had bilateral  ureteral stents for stones.   ALLERGIES:  Sulfa.   CURRENT MEDICATIONS:  Neurontin 300 t.i.d., digoxin 0.125 daily,  metoprolol 25 mg daily, amiodarone 100 mg daily,  Lexapro 10 mg daily.  She previously was on Valium 5 q.h.s., hydrocodone acetaminophen 375 mg  daily as well as Darvocet, but has not been using any of these recently.   SOCIAL HISTORY:  She is a nonsmoker, nondrinker.  Married, currently  staying with one of her daughters.   FAMILY HISTORY:  Noncontributory.  REVIEW OF SYSTEMS:  Her weight has been stable.  She has a previous  history of cataract surgery.  She says that she tends to keep her eyes  closed a lot.  She has no difficulty hearing.  She has not had any  difficulty swallowing.  Has chronic constipation.  No ulcers or GI  bleeding or dyspepsia.  She has had previous kidney stones and has had  some urinary frequency in the past.  She has had some mild chronic  arthritis.  No history of stroke or TIA.  Other than as noted above, the  remainder of review of systems is unremarkable.   PHYSICAL EXAMINATION:  GENERAL:  She is an elderly woman who is mildly  lethargic, lying on the stretcher with her eyes partially closed.  VITAL SIGNS:  Blood pressure 130/60, pulse 65 and irregular.  SKIN:  Warm and dry.  There is ecchymosis present around her shoulder as  well as her left fourth finger.  ENT:  EOMI.  PERLA.  Sinus clear.  Fundi not examined.  Pharynx  negative.  NECK:  Supple without  masses, JVD, thyromegaly, or bruits.  LUNGS:  Clear to A&P.  CARDIAC:  Normal S1 and S2.  No S3 or murmur.  ABDOMEN:  Soft, nontender.  EXTREMITIES:  Her right foot is in a splint.  There is trace peripheral  edema noted.   DATABASE:  Laboratory data shows normal CBC and chemistry panel.  EKG  shows a right bundle branch block and atrial fibrillation.   IMPRESSION:  1. Unresponsive.  It is atypical for cardiac causes.  It could be due      to low blood pressure or could be medication-induced.  2. Recent diagnosis of reflex sympathetic dystrophy receiving      sympathetic blocks as well as Neurontin.  3. Previous right shoulder surgery.  4. Atrial fibrillation, chronic, rate controlled.   RECOMMENDATIONS:  She will have orthostatic blood pressures.  She will  be admitted and will stop her Neurontin for the time being.  I will get  an internal medicine consult and ask Dr. [name] to reevaluate her.  She  will also have a PT and an OT consult.  Dr. Patty Sermons will follow.      Georga Hacking, M.D.  Electronically Signed     WST/MEDQ  D:  11/20/2007  T:  11/20/2007  Job:  [id number]   cc:   Almedia Balls. [name], M.D.  Cassell Clement, M.D.  Kari Baars, M.D.

## 2011-04-03 NOTE — Discharge Summary (Signed)
Penny [name], [name] NO.:  [id number]   MEDICAL RECORD NO.:  [id number]          PATIENT TYPE:  INP   LOCATION:  5008                         FACILITY:  [facility]   PHYSICIAN:  Almedia Balls. [name], M.D. DATE OF BIRTH:  08-07-1929   DATE OF ADMISSION:  10/27/2007  DATE OF DISCHARGE:  11/06/2007                               DISCHARGE SUMMARY   ADMISSION DIAGNOSES:  1. Right shoulder proximal humerus fracture, displaced.  2. Pressure ulcer right olecranon.  3. Pressure sore right olecranon process.   DISCHARGE DIAGNOSES:  1. Right shoulder displaced proximal humerus fracture.  2. Reflex sympathetic dystrophy right upper extremity  3. Pressure ulcer right olecranon.   PROCEDURE PERFORMED:  Right shoulder hemiarthroplasty using [employer]  FX system that was performed on October 27, 2007 by Dr. [name].   HISTORY OF PRESENT ILLNESS:  The patient is a 77 year old female who is  about a month out from a right proximal humerus fracture.  This is a  displaced fracture which was treated initially with closed treatment and  no surgery.  Unfortunately over the past several weeks, the fractures  displaced requiring operative treatment.  Dr. Hadassah Pais, who had  management the patient nonoperatively for 1 month, requested my  assistance in caring for this fracture.  The patient's pain was getting  worse and her function was worsening despite conservative means.  She  now presents for operative treatment. For further details on the  patient's past medical history and physical examination, please see the  medical record.   HOSPITAL COURSE:  The patient admitted to the orthopedic surgery service  and taken to surgery where on October 27, 2007 she had uncomplicated  right shoulder hemiarthroplasty.  Postoperatively the patient was noted  to have some redness around her olecranon process pressure sore.  This  was treated with IV antibiotics, IV Ancef until her date of  discharge  with resolution of her erythema and swelling. It was also treated  locally with triple antibiotic ointment and a Mepilex dressing which be  done until the wound is fully sealed. Additionally the patient was noted  to be developing worsening RSD symptoms in the right upper extremity  which were noticed 3 days after surgery. She did have a right extremity  Doppler ultrasound to rule out a blood clot.  She also has a diagnosis  of atrial fibrillation and is followed by Dr. Ronny Flurry for the A  fib and was placed on Coumadin.  However, the Coumadin was stopped prior  to discharge pending right upper extremity sympathetic blockade which  will be done by Dr. Oneita Kras. I have discussed this with her  anesthesiology and the hospital and they are unable to perform the  sympathetic blockade in the hospital.  Thus the patient was discharged  to home under the care of her family.  She was seen by PT and OT and up  and moving well about the hallways prior to leaving. Again she has a  fairly significant right upper extremity RSD which will be treated with  outpatient blocks.  I will  be managing her dystrophy as an outpatient.  We will be seeing her back in the office in 1 week for follow-up check.  She is discharged home on her preadmission medications as well as pain  medication and muscle relaxer.  She will be  applying the triple antibiotic ointment to the right elbow with a  Mepilex dressing daily. She does not have to do any dressing changes for  the right shoulder and can get that wet in the shower. I will be seeing  her back in the office in a week.  She will also be getting home health  PT and OT for assistance.      Almedia Balls. [name], M.D.  Electronically Signed     SRN/MEDQ  D:  11/05/2007  T:  11/06/2007  Job:  [id number]

## 2011-04-06 ENCOUNTER — Encounter: Payer: Medicare Other | Admitting: *Deleted

## 2011-04-06 NOTE — Op Note (Signed)
NAMECARLOTTA, Penny [name] NO.:  [id number]   MEDICAL RECORD NO.:  [id number]          PATIENT TYPE:  OIB   LOCATION:  2852                         FACILITY:  [facility]   PHYSICIAN:  Cassell Clement, M.D. DATE OF BIRTH:  Aug 07, 1929   DATE OF PROCEDURE:  02/02/2005  DATE OF DISCHARGE:                                 OPERATIVE REPORT   OPERATION:  DC cardioversion.   HISTORY:  This 77 year old woman has a history of recent onset of atrial  fibrillation.  She has failed to convert on outpatient measures and is  brought in for outpatient cardioversion at this time.   After intravenous Pentothal 100 mg IV anesthesia by Dr. Diamantina Monks,  the  patient was given a single 100 joule shock using the anterior posterior pads  and converted promptly to normal sinus rhythm.  She tolerated the procedure  well and there were no immediate post anesthetic complications.      TB/MEDQ  D:  02/02/2005  T:  02/02/2005  Job:  [id number]   cc:   Maren Beach, M.D.  [address] [address]  [place]  [place] [address]  Fax: [phone]

## 2011-04-06 NOTE — Op Note (Signed)
[place]  Patient:    Penny [name], Penny [name]                     MRN: [id number] Proc. Date: 07/29/00 Adm. Date:  [id number] Attending:  Twana First                           Operative Report  PREOPERATIVE DIAGNOSIS:  Left knee medial meniscus tear.  POSTOPERATIVE DIAGNOSES: 1. Left knee medial and lateral meniscal tears. 2. Left knee tricompartmental grade 3 chondromalacia.  PROCEDURE: 1. Left knee examination under anesthesia followed by arthroscopic partial    medial and lateral meniscectomies. 2. Left knee tricompartmental chondroplasty.  SURGEON:  Elana Alm. Thurston Hole, M.D.  ASSISTANT:  Arlys John D. Petrarca, P.A.-C.  ANESTHESIA:  Local and MAC.  OPERATIVE TIME:  30 minutes.  COMPLICATIONS:  None.  INDICATIONS FOR PROCEDURE:  Ms. Overbaugh is a 77 year old woman who has had six months of increasing left knee pain with signs and symptoms consistent with medial meniscus tear, that has failed conservative care and is now to undergo arthroscopy.  DESCRIPTION OF PROCEDURE:  Ms. Sobieski was brought to the operating room on July 29, 2000, after a block had been placed in the holding room.  She was placed on the operating table in the supine position.  Her left knee was examined under anesthesia, range of motion at 0-125 degrees, 1-2+ crepitation, , a stable ligamentous exam with normal patellar tracking.  The left leg was prepped with the use of sterile Betadine and draped using sterile technique. Originally through an inferolateral portal, the arthroscope with the pump attached was placed in through an inferomedial portal, and an arthroscopic probe was placed.  On initial inspection of the medial compartment, she was found to have 50-75% grade 3 chondromalacia of the medial femoral condyle and medial tibial plateau, which was thoroughly debrided down to a stable base and a stable rim.  Medial meniscus tear noted, posterior medial horn, of  which 30-40% was resected back to a stable rim.  Intercondylar notch inspected. Anterior and posterior cruciate ligaments were found to be normal.  Lateral compartment inspected.  Articular cartilage of the lateral femoral condyle showed a 30% grade 3 chondral defect, which was debrided.  The lateral tibial plateau showed mild grade 1 and 2 changes.  The lateral meniscus was probed, and she had a posterolateral corner tear, of which 30% was resected back to a stable rim.  Patellofemoral joint showed grade 3 chondromalacia over 75% of the femoral groove and patella, which was thoroughly debrided.  The patella tracked normally.  Moderate synovitis in the medial and lateral gutters was debrided. Otherwise, they were free of pathology.  After this was done, it was felt that all pathology had been satisfactorily addressed.  The instruments were removed.  The portals were closed with 3-0 nylon suture and injected with 0.25% Marcaine with epinephrine and 5 mg of morphine.  A sterile dressing was applied and the patient awakened and taken to the recovery room in stable condition.  FOLLOW-UP CARE:  Ms. Kram will be followed as an outpatient on Vicodin and Celebrex.  She will be back in the office in a week for sutures out and follow-up. DD:  07/29/00 TD:  07/29/00 Job: [id number]

## 2011-04-10 ENCOUNTER — Ambulatory Visit (INDEPENDENT_AMBULATORY_CARE_PROVIDER_SITE_OTHER): Payer: Medicare Other | Admitting: *Deleted

## 2011-04-10 DIAGNOSIS — Z7901 Long term (current) use of anticoagulants: Secondary | ICD-10-CM

## 2011-04-10 DIAGNOSIS — I4891 Unspecified atrial fibrillation: Secondary | ICD-10-CM

## 2011-04-13 ENCOUNTER — Telehealth: Payer: Self-pay | Admitting: Cardiology

## 2011-04-13 NOTE — Telephone Encounter (Signed)
Advised ok to take together per Dr. Patty Sermons.

## 2011-04-13 NOTE — Telephone Encounter (Signed)
PT HAD ANOTHER MD PUT HER ON CYMBALTA, PT IS ON COUMADIN. CALL BACK AT [phone].

## 2011-04-24 ENCOUNTER — Encounter: Payer: Medicare Other | Admitting: *Deleted

## 2011-04-25 ENCOUNTER — Other Ambulatory Visit: Payer: Self-pay | Admitting: Cardiology

## 2011-04-25 DIAGNOSIS — I4891 Unspecified atrial fibrillation: Secondary | ICD-10-CM

## 2011-04-25 MED ORDER — WARFARIN SODIUM 5 MG PO TABS: 5.0000 mg | ORAL_TABLET | Freq: Every day | ORAL | Status: DC

## 2011-04-25 NOTE — Telephone Encounter (Signed)
On the personal side and does not want to tell me why she  Wants Penny [name] to call her back!

## 2011-04-25 NOTE — Telephone Encounter (Signed)
Refilled coumadin per pt request

## 2011-05-01 ENCOUNTER — Encounter: Payer: Medicare Other | Admitting: *Deleted

## 2011-05-04 ENCOUNTER — Ambulatory Visit (INDEPENDENT_AMBULATORY_CARE_PROVIDER_SITE_OTHER): Payer: Medicare Other | Admitting: *Deleted

## 2011-05-04 DIAGNOSIS — I4891 Unspecified atrial fibrillation: Secondary | ICD-10-CM

## 2011-05-04 DIAGNOSIS — Z7901 Long term (current) use of anticoagulants: Secondary | ICD-10-CM

## 2011-05-07 ENCOUNTER — Other Ambulatory Visit: Payer: Self-pay | Admitting: Cardiology

## 2011-05-07 DIAGNOSIS — F419 Anxiety disorder, unspecified: Secondary | ICD-10-CM

## 2011-05-07 NOTE — Telephone Encounter (Signed)
Per patient request

## 2011-05-07 NOTE — Telephone Encounter (Signed)
Patient needs refill of Diazapam script [phone].

## 2011-05-12 MED ORDER — DIAZEPAM 5 MG PO TABS: 5.0000 mg | ORAL_TABLET | ORAL | Status: DC | PRN

## 2011-05-22 ENCOUNTER — Encounter: Payer: Medicare Other | Admitting: *Deleted

## 2011-05-28 ENCOUNTER — Other Ambulatory Visit: Payer: Self-pay | Admitting: Cardiology

## 2011-05-29 ENCOUNTER — Other Ambulatory Visit: Payer: Self-pay | Admitting: *Deleted

## 2011-05-29 MED ORDER — ROSUVASTATIN CALCIUM 10 MG PO TABS: 5.0000 mg | ORAL_TABLET | Freq: Every day | ORAL | Status: DC

## 2011-05-29 NOTE — Telephone Encounter (Signed)
Fax received from pharmacy. Refill completed. Jodette Gearl Baratta RN  

## 2011-05-31 ENCOUNTER — Ambulatory Visit (INDEPENDENT_AMBULATORY_CARE_PROVIDER_SITE_OTHER): Payer: Medicare Other | Admitting: *Deleted

## 2011-05-31 DIAGNOSIS — I4891 Unspecified atrial fibrillation: Secondary | ICD-10-CM

## 2011-05-31 DIAGNOSIS — Z7901 Long term (current) use of anticoagulants: Secondary | ICD-10-CM

## 2011-06-11 ENCOUNTER — Ambulatory Visit (INDEPENDENT_AMBULATORY_CARE_PROVIDER_SITE_OTHER): Payer: Medicare Other | Admitting: *Deleted

## 2011-06-11 DIAGNOSIS — I4891 Unspecified atrial fibrillation: Secondary | ICD-10-CM

## 2011-06-11 DIAGNOSIS — Z7901 Long term (current) use of anticoagulants: Secondary | ICD-10-CM

## 2011-06-11 LAB — POCT INR: INR: 2.3

## 2011-06-19 ENCOUNTER — Telehealth: Payer: Self-pay | Admitting: Cardiology

## 2011-06-19 NOTE — Telephone Encounter (Signed)
recvd call from [facility] at dr. Clelia Croft office requesting last ov and coumadin notes, they will start following patient coumadin.  Please fax to [phone]. Attn Dr Martha Clan.

## 2011-06-22 ENCOUNTER — Telehealth: Payer: Self-pay | Admitting: Cardiology

## 2011-06-22 NOTE — Telephone Encounter (Signed)
Resent

## 2011-06-22 NOTE — Telephone Encounter (Signed)
recvd fax from Selena Batten at [facility] requesting page 2 and 3 of 06/11/2011 anti coag visit.

## 2011-06-22 NOTE — Telephone Encounter (Signed)
Verified with Natasha Bence, gave request to Amy, no chart found, waiting to hear back

## 2011-06-25 NOTE — Telephone Encounter (Signed)
This information was faxed to PCP office.

## 2011-06-27 ENCOUNTER — Encounter: Payer: Medicare Other | Admitting: *Deleted

## 2011-06-27 ENCOUNTER — Encounter: Payer: Self-pay | Admitting: Cardiology

## 2011-06-27 ENCOUNTER — Ambulatory Visit (INDEPENDENT_AMBULATORY_CARE_PROVIDER_SITE_OTHER): Payer: Medicare Other | Admitting: Cardiology

## 2011-06-27 DIAGNOSIS — Z7901 Long term (current) use of anticoagulants: Secondary | ICD-10-CM

## 2011-06-27 DIAGNOSIS — I4891 Unspecified atrial fibrillation: Secondary | ICD-10-CM

## 2011-06-27 DIAGNOSIS — R42 Dizziness and giddiness: Secondary | ICD-10-CM

## 2011-06-27 NOTE — Assessment & Plan Note (Signed)
The patient has had no new symptoms referable to her atrial fibrillation.  She denies any symptoms to suggest TIA or stroke.  She's not having any symptoms of congestive heart failure

## 2011-06-27 NOTE — Assessment & Plan Note (Signed)
Patient denies any symptoms of the hematochezia or melena or blood loss from her anticoagulation.

## 2011-06-27 NOTE — Progress Notes (Signed)
Ashok Cordia Jia Date of Birth:  25-Feb-1929 [facility] / [facility] [address]   [address] [place], [place]  [address] [phone]           Fax   [phone]  HPI: This pleasant 77 year old woman is seen for a scheduled followup office visit.  She has a history of established atrial fibrillation.  He has been on long-term Coumadin.  Her last echocardiogram was 04/13/09 showing normal left ventricular systolic function and no wall motion abnormalities and biatrial enlargement.  She has mild mitral regurgitation and normal pulmonary artery pressure.  She does not have any history of ischemic heart disease.  She had a normal nuclear stress test on 01/28/08.  She does have a known right bundle-branch block on her EKG.  Her ejection fraction by nuclear scan was 61%.  Recently she's been experiencing some occasional dizziness.  No chest pain or angina.  No symptoms of congestive heart failure.  Current Outpatient Prescriptions  Medication Sig Dispense Refill  . Ascorbic Acid (VITAMIN C) 500 MG tablet Take 500 mg by mouth daily.        . calcium carbonate (OS-CAL) 600 MG TABS Take 600 mg by mouth 2 (two) times daily with a meal.        . Cholecalciferol (VITAMIN D) 2000 UNITS tablet Take 4,000 Units by mouth daily.       . diazepam (VALIUM) 5 MG tablet Take 1 tablet (5 mg total) by mouth as needed.  30 tablet  5  . meclizine (ANTIVERT) 25 MG tablet Take 25 mg by mouth as needed.        . metoprolol succinate (TOPROL-XL) 25 MG 24 hr tablet Take 12.5 mg by mouth daily.        . multivitamin (THERAGRAN) per tablet Take 1 tablet by mouth daily.        . Omega-3 Fatty Acids (FISH OIL PO) Take 400 mg by mouth daily.        . rosuvastatin (CRESTOR) 10 MG tablet Take 0.5 tablets (5 mg total) by mouth daily. Or as directed by physician  90 tablet  1  . warfarin (COUMADIN) 5 MG tablet Take 1 tablet (5 mg total) by mouth daily. As directed by the Coumadin Clinic  90 tablet  3    Allergies    Allergen Reactions  . Digoxin And Related   . Zocor (Simvastatin)   . Sulfa Antibiotics Rash    Patient Active Problem List  Diagnoses  . Atrial fibrillation  . Long term current use of anticoagulant  . Malaise and fatigue  . Right bundle branch block  . Hypercholesterolemia    History  Smoking status  . Former Smoker  Smokeless tobacco  . Not on file    History  Alcohol Use No    Family History  Problem Relation Age of Onset  . Heart disease Mother     Review of Systems: The patient denies any heat or cold intolerance.  No weight gain or weight loss.  The patient denies headaches or blurry vision.  There is no cough or sputum production.  The patient denies dizziness.  There is no hematuria or hematochezia.  The patient denies any muscle aches or arthritis.  The patient denies any rash.  The patient denies frequent falling or instability.  There is no history of depression or anxiety.  All other systems were reviewed and are negative.   Physical Exam: Filed Vitals:   06/27/11 1124  BP:  100/65  Pulse: 80  The general appearance feels a well-developed well-nourished woman in no distress.Pupils equal and reactive.   Extraocular Movements are full.  There is no scleral icterus.  The mouth and pharynx are normal.  The neck is supple.  The carotids reveal no bruits.  The jugular venous pressure is normal.  The thyroid is not enlarged.  There is no lymphadenopathy.  The chest is clear to percussion and auscultation.  Heart reveals no murmur gallop rub or click.  The abdomen is soft and nontender.  The extremities show no phlebitis or edema.  Neurologic exam is physiologic.  The patient does have limitation of motion of her right shoulder from previous surgical repair.        Assessment / Plan: Continue same medication.  Increase dietary salt to help support blood pressure better and decrease her occasional symptoms of dizziness.  Recheck in 3 months for followup office  visit

## 2011-07-09 ENCOUNTER — Encounter: Payer: Self-pay | Admitting: *Deleted

## 2011-07-10 ENCOUNTER — Emergency Department (HOSPITAL_COMMUNITY): Payer: Medicare Other

## 2011-07-10 ENCOUNTER — Inpatient Hospital Stay (HOSPITAL_COMMUNITY)
Admission: EM | Admit: 2011-07-10 | Discharge: 2011-07-11 | DRG: 690 | Disposition: A | Discharge status: Home / Self Care | Payer: Medicare Other | Source: Ambulatory Visit | Attending: Internal Medicine | Admitting: Internal Medicine

## 2011-07-10 DIAGNOSIS — I4891 Unspecified atrial fibrillation: Secondary | ICD-10-CM | POA: Diagnosis present

## 2011-07-10 DIAGNOSIS — R5381 Other malaise: Secondary | ICD-10-CM | POA: Diagnosis present

## 2011-07-10 DIAGNOSIS — Z882 Allergy status to sulfonamides status: Secondary | ICD-10-CM

## 2011-07-10 DIAGNOSIS — E7800 Pure hypercholesterolemia, unspecified: Secondary | ICD-10-CM | POA: Diagnosis present

## 2011-07-10 DIAGNOSIS — R0789 Other chest pain: Secondary | ICD-10-CM | POA: Diagnosis present

## 2011-07-10 DIAGNOSIS — N289 Disorder of kidney and ureter, unspecified: Secondary | ICD-10-CM | POA: Diagnosis present

## 2011-07-10 DIAGNOSIS — Z79899 Other long term (current) drug therapy: Secondary | ICD-10-CM

## 2011-07-10 DIAGNOSIS — E785 Hyperlipidemia, unspecified: Secondary | ICD-10-CM | POA: Diagnosis present

## 2011-07-10 DIAGNOSIS — F329 Major depressive disorder, single episode, unspecified: Secondary | ICD-10-CM | POA: Diagnosis present

## 2011-07-10 DIAGNOSIS — H8110 Benign paroxysmal vertigo, unspecified ear: Secondary | ICD-10-CM | POA: Diagnosis present

## 2011-07-10 DIAGNOSIS — I10 Essential (primary) hypertension: Secondary | ICD-10-CM | POA: Diagnosis present

## 2011-07-10 DIAGNOSIS — I4510 Unspecified right bundle-branch block: Secondary | ICD-10-CM | POA: Diagnosis present

## 2011-07-10 DIAGNOSIS — M810 Age-related osteoporosis without current pathological fracture: Secondary | ICD-10-CM | POA: Diagnosis present

## 2011-07-10 DIAGNOSIS — R350 Frequency of micturition: Secondary | ICD-10-CM | POA: Diagnosis present

## 2011-07-10 DIAGNOSIS — J309 Allergic rhinitis, unspecified: Secondary | ICD-10-CM | POA: Diagnosis present

## 2011-07-10 DIAGNOSIS — G3184 Mild cognitive impairment, so stated: Secondary | ICD-10-CM | POA: Diagnosis present

## 2011-07-10 DIAGNOSIS — Z96659 Presence of unspecified artificial knee joint: Secondary | ICD-10-CM

## 2011-07-10 DIAGNOSIS — Z7901 Long term (current) use of anticoagulants: Secondary | ICD-10-CM

## 2011-07-10 DIAGNOSIS — I517 Cardiomegaly: Secondary | ICD-10-CM | POA: Diagnosis present

## 2011-07-10 DIAGNOSIS — N390 Urinary tract infection, site not specified: Principal | ICD-10-CM | POA: Diagnosis present

## 2011-07-10 LAB — URINE MICROSCOPIC-ADD ON

## 2011-07-10 LAB — DIFFERENTIAL
Basophils Absolute: 0 10*3/uL (ref 0.0–0.1)
Basophils Relative: 0 % (ref 0–1)
Eosinophils Absolute: 0 10*3/uL (ref 0.0–0.7)
Eosinophils Relative: 0 % (ref 0–5)
Lymphocytes Relative: 11 % — ABNORMAL LOW (ref 12–46)
Monocytes Absolute: 0.6 10*3/uL (ref 0.1–1.0)

## 2011-07-10 LAB — GLUCOSE, CAPILLARY: Glucose-Capillary: 81 mg/dL (ref 70–99)

## 2011-07-10 LAB — URINALYSIS, ROUTINE W REFLEX MICROSCOPIC
Leukocytes, UA: NEGATIVE
Nitrite: NEGATIVE
Specific Gravity, Urine: 1.013 (ref 1.005–1.030)
Urobilinogen, UA: 1 mg/dL (ref 0.0–1.0)
pH: 7.5 (ref 5.0–8.0)

## 2011-07-10 LAB — CBC
MCHC: 35 g/dL (ref 30.0–36.0)
Platelets: 131 10*3/uL — ABNORMAL LOW (ref 150–400)
RDW: 13.5 % (ref 11.5–15.5)
WBC: 9 10*3/uL (ref 4.0–10.5)

## 2011-07-10 LAB — POCT I-STAT TROPONIN I: Troponin i, poc: 0.01 ng/mL (ref 0.00–0.08)

## 2011-07-10 LAB — POCT I-STAT, CHEM 8
BUN: 18 mg/dL (ref 6–23)
Calcium, Ion: 1.21 mmol/L (ref 1.12–1.32)
HCT: 39 % (ref 36.0–46.0)
Hemoglobin: 13.3 g/dL (ref 12.0–15.0)
Sodium: 144 mEq/L (ref 135–145)
TCO2: 28 mmol/L (ref 0–100)

## 2011-07-10 LAB — PROTIME-INR
INR: 1.79 — ABNORMAL HIGH (ref 0.00–1.49)
Prothrombin Time: 21.1 seconds — ABNORMAL HIGH (ref 11.6–15.2)

## 2011-07-11 ENCOUNTER — Other Ambulatory Visit (HOSPITAL_COMMUNITY): Payer: Self-pay

## 2011-07-11 ENCOUNTER — Observation Stay (HOSPITAL_COMMUNITY): Payer: Medicare Other

## 2011-07-11 LAB — COMPREHENSIVE METABOLIC PANEL
ALT: 125 U/L — ABNORMAL HIGH (ref 0–35)
AST: 322 U/L — ABNORMAL HIGH (ref 0–37)
Alkaline Phosphatase: 95 U/L (ref 39–117)
Calcium: 9.4 mg/dL (ref 8.4–10.5)
GFR calc Af Amer: >60 mL/min (ref >60)
Glucose, Bld: 79 mg/dL (ref 70–99)
Potassium: 3 mEq/L — ABNORMAL LOW (ref 3.5–5.1)
Sodium: 143 mEq/L (ref 135–145)
Total Protein: 5.9 g/dL — ABNORMAL LOW (ref 6.0–8.3)

## 2011-07-11 LAB — CBC
HCT: 35.9 % — ABNORMAL LOW (ref 36.0–46.0)
MCV: 89.5 fL (ref 78.0–100.0)
Platelets: 141 10*3/uL — ABNORMAL LOW (ref 150–400)
RBC: 4.01 MIL/uL (ref 3.87–5.11)
WBC: 4.9 10*3/uL (ref 4.0–10.5)

## 2011-07-11 LAB — CARDIAC PANEL(CRET KIN+CKTOT+MB+TROPI)
CK, MB: 2.4 ng/mL (ref 0.3–4.0)
CK, MB: 2.4 ng/mL (ref 0.3–4.0)
Relative Index: INVALID (ref 0.0–2.5)
Relative Index: INVALID (ref 0.0–2.5)
Relative Index: INVALID (ref 0.0–2.5)
Total CK: 56 U/L (ref 7–177)
Troponin I: <0.3 ng/mL (ref <0.30)

## 2011-07-11 LAB — PROTIME-INR: INR: 2.19 — ABNORMAL HIGH (ref 0.00–1.49)

## 2011-07-11 MED ORDER — IOHEXOL 300 MG/ML  SOLN: 80.0000 mL | Freq: Once | INTRAMUSCULAR | Status: DC | PRN

## 2011-07-11 MED ORDER — IOHEXOL 300 MG/ML  SOLN
80.0000 mL | Freq: Once | INTRAMUSCULAR | Status: AC | PRN
  Administered 2011-07-11: 80 mL via INTRAVENOUS

## 2011-07-17 LAB — CULTURE, BLOOD (ROUTINE X 2)
Culture  Setup Time: 201208221011
Culture: NO GROWTH

## 2011-07-19 ENCOUNTER — Encounter: Payer: Self-pay | Admitting: Cardiology

## 2011-07-19 ENCOUNTER — Ambulatory Visit (INDEPENDENT_AMBULATORY_CARE_PROVIDER_SITE_OTHER): Payer: Medicare Other | Admitting: Cardiology

## 2011-07-19 VITALS — BP 110/68 | HR 92 | Wt 139.0 lb

## 2011-07-19 DIAGNOSIS — R5381 Other malaise: Secondary | ICD-10-CM

## 2011-07-19 DIAGNOSIS — Z7901 Long term (current) use of anticoagulants: Secondary | ICD-10-CM

## 2011-07-19 DIAGNOSIS — I499 Cardiac arrhythmia, unspecified: Secondary | ICD-10-CM

## 2011-07-19 DIAGNOSIS — I4891 Unspecified atrial fibrillation: Secondary | ICD-10-CM

## 2011-07-19 NOTE — Patient Instructions (Signed)
We will order 2D echo. 

## 2011-07-19 NOTE — Assessment & Plan Note (Signed)
The patient has been on long-term Coumadin.  She has not been experiencing any evidence of occult blood loss.  Her hemoglobin is normal

## 2011-07-19 NOTE — Progress Notes (Signed)
Penny [name] Date of Birth:  07/28/1929 [facility] / [facility] [address]   [address] [place], [place]  [address] [phone]           Fax   [phone]  History of Present Illness: This 77 year old Caucasian female is seen as a work in office visit.  Her husband and her daughter who is also a Engineer, civil (consulting) at [employer] came with her today.  The patient had asked to be seen because of concern about her heart not feeling right.  She had recently been admitted to  on 07/11/11 with shaking chills but no fever and was found to have a urinary tract infection which was treated.  On admission her liver function studies were markedly elevated and her Crestor which she had been on for several years was stopped.  Followup blood work done at Dr. Alver Fisher office earlier this week showed return of liver function studies normal.  The patient did not have any symptoms to suggest gallbladder colic at the time of her presentation to [facility].  Her cardiac enzymes did not show any evidence of myocardial injury.  The patient has a history of chronic atrial fibrillation and has been on long-term Coumadin she also has a history of known right bundle-branch block which is also chronic.  The patient has had some problems with her memory and she is not clear on a lot of the details of her recent illnesses.  Current Outpatient Prescriptions  Medication Sig Dispense Refill  . Ascorbic Acid (VITAMIN C) 500 MG tablet Take 500 mg by mouth daily.        . calcium carbonate (OS-CAL) 600 MG TABS Take 600 mg by mouth 2 (two) times daily with a meal.        . Cholecalciferol (VITAMIN D) 2000 UNITS tablet Take 4,000 Units by mouth daily.       . Cyanocobalamin (VITAMIN B12 PO) Take by mouth. Taking 500 daily       . diazepam (VALIUM) 5 MG tablet Take 1 tablet (5 mg total) by mouth as needed.  30 tablet  5  . docusate sodium (COLACE) 100 MG capsule Take 100 mg by mouth 2 (two) times daily. As needed       .  donepezil (ARICEPT) 10 MG tablet Take 10 mg by mouth at bedtime as needed.        . loratadine (CLARITIN) 10 MG tablet Take 10 mg by mouth daily. As needed       . meclizine (ANTIVERT) 25 MG tablet Take 25 mg by mouth as needed.        . metoprolol succinate (TOPROL-XL) 25 MG 24 hr tablet Take 12.5 mg by mouth daily.        . multivitamin (THERAGRAN) per tablet Take 1 tablet by mouth daily.        . Omega-3 Fatty Acids (FISH OIL PO) Take 400 mg by mouth daily. Taking 1400 daily      . rosuvastatin (CRESTOR) 10 MG tablet Take 0.5 tablets (5 mg total) by mouth daily. Or as directed by physician  90 tablet  1  . warfarin (COUMADIN) 5 MG tablet Take 1 tablet (5 mg total) by mouth daily. As directed by the Coumadin Clinic  90 tablet  3    Allergies  Allergen Reactions  . Digoxin And Related   . Zocor (Simvastatin)   . Sulfa Antibiotics Rash    Patient Active Problem List  Diagnoses  . Atrial  fibrillation  . Long term current use of anticoagulant  . Malaise and fatigue  . Right bundle branch block  . Hypercholesterolemia  . Dizziness - light-headed    History  Smoking status  . Former Smoker  Smokeless tobacco  . Not on file    History  Alcohol Use No    Family History  Problem Relation Age of Onset  . Heart disease Mother     Review of Systems: Constitutional: no fever chills diaphoresis or fatigue or change in weight.  Head and neck: no hearing loss, no epistaxis, no photophobia or visual disturbance. Respiratory: No cough, shortness of breath or wheezing. Cardiovascular: No chest pain peripheral edema, palpitations. Gastrointestinal: No abdominal distention, no abdominal pain, no change in bowel habits hematochezia or melena. Genitourinary: No dysuria, no frequency, no urgency, no nocturia. Musculoskeletal:No arthralgias, no back pain, no gait disturbance or myalgias. Neurological: No dizziness, no headaches, no numbness, no seizures, no syncope, no weakness, no  tremors. Hematologic: No lymphadenopathy, no easy bruising. Psychiatric: No confusion, no hallucinations, no sleep disturbance.    Physical Exam: Filed Vitals:   07/19/11 1520  BP: 110/68  Pulse: 92  The general appearance reveals an elderly woman in no acute distress.  The skin is warm and dry.Pupils equal and reactive.   Extraocular Movements are full.  There is no scleral icterus.  The mouth and pharynx are normal.  The neck is supple.  The carotids reveal no bruits.  The jugular venous pressure is normal.  The thyroid is not enlarged.  There is no lymphadenopathy.  The chest is clear to percussion and auscultation. There are no rales or rhonchi. Expansion of the chest is symmetrical.  The precordium is quiet.  The first heart sound is normal.  The second heart sound is physiologically split.  There is no murmur gallop rub or click.  There is no abnormal lift or heave.The rhythm is irregular  The abdomen is soft and nontender. Bowel sounds are normal. The liver and spleen are not enlarged. There Are no abdominal masses. There are no bruits. The pedal pulses are good.  There is no phlebitis or edema.  There is no cyanosis or clubbing.  Strength is normal and symmetrical in all extremities.  There is no lateralizing weakness.  There are no sensory deficits.She does have some decline in recent memory    Assessment / Plan: At this point we will make any change in her medications.  She has been started back on her Crestor by Dr. Clelia Croft who will be rechecking her blood work again in September.  We are going to schedule an up-to-dateOf her echocardiogram.Recheck here at her regular visit or sooner p.r.n.

## 2011-07-19 NOTE — Assessment & Plan Note (Signed)
Today her heart rhythm by EKG is atrial fibrillation with a controlled ventricular response and a heart rate of 92.  She is on a small dose of Toprol 12.5 mg daily.  Because she has been complaining so much about malaise and fatigue and no energy I am inclined not to increase her blocker dose any further.  One could consider adding low dose digoxin to her regimen but in the past she had not done well on digoxin because of excessive bradycardia.

## 2011-07-19 NOTE — Assessment & Plan Note (Signed)
The patient has been complaining of malaise and fatigue and no energy.  Dr. Clelia Croft has worked this up.  The patient is not anemic.  Her hemoglobin at the time of her admission to [facility] last week was normal.  The family was asking whether her thyroid had been checked and I told Them that I was fairly sure that Dr. Clelia Croft would have checked at as part of his evaluation of her fatigue

## 2011-07-20 ENCOUNTER — Ambulatory Visit (HOSPITAL_COMMUNITY): Payer: Medicare Other | Attending: Cardiology | Admitting: Radiology

## 2011-07-20 DIAGNOSIS — R5383 Other fatigue: Secondary | ICD-10-CM | POA: Insufficient documentation

## 2011-07-20 DIAGNOSIS — I080 Rheumatic disorders of both mitral and aortic valves: Secondary | ICD-10-CM | POA: Insufficient documentation

## 2011-07-20 DIAGNOSIS — I379 Nonrheumatic pulmonary valve disorder, unspecified: Secondary | ICD-10-CM | POA: Insufficient documentation

## 2011-07-20 DIAGNOSIS — I079 Rheumatic tricuspid valve disease, unspecified: Secondary | ICD-10-CM | POA: Insufficient documentation

## 2011-07-20 DIAGNOSIS — I4891 Unspecified atrial fibrillation: Secondary | ICD-10-CM

## 2011-07-20 DIAGNOSIS — R42 Dizziness and giddiness: Secondary | ICD-10-CM | POA: Insufficient documentation

## 2011-07-20 DIAGNOSIS — R5381 Other malaise: Secondary | ICD-10-CM | POA: Insufficient documentation

## 2011-07-23 NOTE — H&P (Signed)
[name], [name]:  [id number]  MEDICAL RECORD NO.:  [id number]  LOCATION:  04-12-2028                         FACILITY:  [facility]  PHYSICIAN:  Penny Pounds, MD       DATE OF BIRTH:  Aug 18, 1929  DATE OF ADMISSION:  07/10/2011 DATE OF DISCHARGE:                             HISTORY & PHYSICAL   PRIMARY CARE PROVIDER:  Dr. Clelia Croft.  PRIMARY CARDIOLOGIST:  Cassell Clement, MD  CHIEF COMPLAINT:  Chest pain x2, freezing chills, and rigors.  HISTORY OF PRESENT ILLNESS:  This is an 77 year old female with complain of chest pain x1 yesterday lasting a few seconds in the middle of chest associated with cold, clammy, diaphoresis and nausea.  Chest pain recurred today with the same symptoms and she took an aspirin and vomited x1.  She had no hematemesis.  She called 911 and was brought to the ER.  Evaluation in the ER was normal.  Blood pressure was normal. Temp and saturations were normal.  Chest pain is gone and she reports feeling back to her baseline and wants to go home.  She actually is currently under several blankets.  Dr. Ethelda Chick called and asked me to admit her because she had shaking chills and rigors and was very ill, almost as if she is bacteremic.  She is certainly not septic.  IV fluids have been started and workup is ensued.  PAST MEDICAL HISTORY: 1. Allergic rhinitis. 2. AFib, on chronic Coumadin therapy. 3. Chronic fatigue. 4. Leg cramps. 5. Mild cognitive decline. 6. Hypernatremia. 7. Benign paroxysmal positional vertigo. 8. History major depression. 9. Hypertension. 10.Hyperlipidemia. 11.Osteoporosis. 12.History of shingles. 13.History of left kidney mass.  Biopsy negative in 04/13/07, stable on     CT. 14.History of right nephrolithiasis. 15.History of right shoulder fracture. 16.History of right total knee replacement. 17.History of hysterectomy. 18.History of bladder tack operation. 19.History of ureteral stents for stones. 20.Status  post lithotripsy in August 2008 at [facility]. 21.History of proximal humeral fracture, status post right     hemiarthroplasty in 13-Apr-2007. 22.History of right upper extremity sympathetic block x2 in the past     due to reflex sympathetic dystrophy. 23.History of right olecranon pressure ulcer.  SOCIAL HISTORY:  She is married.  Her husband is currently undergoing treatment for bladder cancer.  She has got three children and five grandchildren.  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  ALLERGIES:  SULFA.  MEDICATION LIST: 1. Centrum Silver 1 daily. 2. Toprol-XL 50 mg 1/2 in the morning. 3. Coumadin 5 mg 4 times per week, 2.5 mg 3 times per week. 4. Crestor 10 mg 1/2 daily. 5. Fosamax 70 mg weekly. 6. Benazepril 10 daily. 7. Vitamin D3 1000 daily. 8. Vitamin C daily. 9. Fish oil 1400 daily. 10.Meclizine 25 p.r.n. 11.Cymbalta 60 daily.  FAMILY HISTORY:  Father died following a fall in Apr 12, 1992.  Mother died of heart disease in her 1s.  REVIEW OF SYSTEMS:  Recent thick phlegm, seen Dr. Corinda Gubler and constant sinus complaints.  Anorexia for the last 2-3 days.  Nausea.  Please see HPI for details.  No other symptoms noted  or reported and full review of systems was obtained.  PHYSICAL EXAMINATION:  VITAL SIGNS:  Blood pressure 112/55 up to 121/67, heart rate 88-100, respiratory rate 18-22, saturating 96-99% on room air, and temperature 97.4-98.5. GENERAL:  Alert and oriented with some minimal confusion.  Oropharynx is dry. NECK:  No JVD. ABDOMEN:  Soft. CARDIAC:  Irregularly irregular. PULMONARY:  Clear to auscultation bilaterally. EXTREMITIES:  No edema.  EKG shows AFib, right bundle-branch block.  Chest x-ray, cardiomyopathy without congestive heart failure or pneumonia, chronic left lower lobe scar, 2-cm sclerotic focus of the manubrium.  Sodium 144, potassium 3.5, chloride 107, bicarb 28, BUN 18, creatinine 1.0,  and glucose 101.  White count 9.0, hemoglobin 13.7, and platelet count 131.  INR 1.79, troponin- I 0.01.  Urinalysis is negative dipstick, but 3-6 white cells, 3-6 red cells, and many bacteria on microscopy.  ASSESSMENT:  This is an elderly lady being admitted with a probable urinary tract infection, although the urine is not overtly positive with significant chills and rigors.  We are going to rule out bacteremia, but she also has chest pain and we are going to rule out myocardial infarction.  PLAN: 1. 23 hours of observation has been ordered. 2. IV fluids. 3. Antibiotics will include Rocephin. 4. Full cultures including urine and blood will be obtained. 5. Rule out MI with serial enzymes. 6. History of urine, bladder, and ureteral stent issues.  We will go     ahead and order renal ultrasound tomorrow. 7. We will get DVT prophylaxis.  Rule out myocardial infarction and     dose Lovenox and once we get her INR 2-3 will be in good shape. 8. Dr. Sherryll Burger to address this, whether we are going to do a CT scan of     her manubrium and will have a low threshold to consult Dr.     Patty Sermons if needed for further cardiac workup.  Outpatient workup     per Dr. Patty Sermons in the past was in 2010 with an echocardiogram     revealing EF 55-60% and moderate tricuspid regurgitation, but the     rest of the echocardiogram was relatively normal with normal     pulmonary pressures. 9. For her atrial fibrillation, she is rate controlled and certainly     not rhythm controlled.  She had been on the amiodarone and digoxin     in the past.  She is not currently on these medications.  She is     only on metoprolol.  She is also on Coumadin and we will attempt to     get her INR back to 2-3. 10.For her hypercholesterolemia, she will continue on Crestor. 11.For her history of malaise and fatigue, she is certainly not anemic     and Dr. Sherryll Burger and Dr. Patty Sermons have been attempting to work this up     as an  outpatient. 12.Mild cognitive decline.  She remains on Aricept and tolerating it     well. 13.Lower extremity muscle aches, no improvement once she stopped     Crestor and therefore she is back on it.     Penny Pounds, MD     JMR/MEDQ  D:  07/10/2011  T:  07/11/2011  Job:  [id number]  cc:   Dr. Ruthe Mannan, M.D.  Electronically Signed by Creola Corn MD on 07/23/2011 09:59:08 AM

## 2011-08-02 ENCOUNTER — Telehealth: Payer: Self-pay | Admitting: *Deleted

## 2011-08-02 DIAGNOSIS — R0989 Other specified symptoms and signs involving the circulatory and respiratory systems: Secondary | ICD-10-CM

## 2011-08-02 MED ORDER — LISINOPRIL 5 MG PO TABS: 5.0000 mg | ORAL_TABLET | Freq: Every day | ORAL | Status: DC

## 2011-08-02 NOTE — Telephone Encounter (Signed)
Message copied by Burnell Blanks on Thu Aug 02, 2011 12:41 PM ------      Message from: Cassell Clement      Created: Sun Jul 29, 2011  8:46 AM       Please report.  The echocardiogram shows that the ventricular ejection fraction is mildly depressed in the range of 45-50%.  To help strengthen her left ventricular function.  I want to add lisinopril 5 mg, one tablet daily, and she should return in one week for a followup basal metabolic panel.

## 2011-08-02 NOTE — Telephone Encounter (Signed)
Advised of results and sent to Dr Clelia Croft

## 2011-08-02 NOTE — Progress Notes (Signed)
Advised of results

## 2011-08-08 LAB — PROTIME-INR: Prothrombin Time: 13.5

## 2011-08-08 LAB — CULTURE, BLOOD (ROUTINE X 2): Culture: NO GROWTH

## 2011-08-08 LAB — URINALYSIS, ROUTINE W REFLEX MICROSCOPIC
Bilirubin Urine: NEGATIVE
Hgb urine dipstick: NEGATIVE
Ketones, ur: NEGATIVE
Nitrite: POSITIVE — AB
Specific Gravity, Urine: 1.018
pH: 6.5

## 2011-08-08 LAB — COMPREHENSIVE METABOLIC PANEL
AST: 27
BUN: 10
CO2: 22
Calcium: 9.2
Chloride: 108
Creatinine, Ser: 0.79
GFR calc non Af Amer: >60
Glucose, Bld: 97
Total Bilirubin: 0.9

## 2011-08-08 LAB — URINE MICROSCOPIC-ADD ON

## 2011-08-08 LAB — URINE CULTURE: Colony Count: >=100000

## 2011-08-08 LAB — CBC
HCT: 37.5
Platelets: 157
RBC: 4.11
RBC: 4.53
WBC: 4.6
WBC: 6.3

## 2011-08-08 LAB — CORTISOL: Cortisol, Plasma: 7.8

## 2011-08-08 LAB — I-STAT 8, (EC8 V) (CONVERTED LAB)
Acid-Base Excess: 1
BUN: 14
Bicarbonate: 28.1 — ABNORMAL HIGH
Chloride: 110
HCT: 45
Hemoglobin: 15.3 — ABNORMAL HIGH
Operator id: 151321
Sodium: 140
pCO2, Ven: 52.5 — ABNORMAL HIGH

## 2011-08-08 LAB — CARDIAC PANEL(CRET KIN+CKTOT+MB+TROPI)
CK, MB: 1.2
Relative Index: INVALID
Troponin I: 0.03

## 2011-08-08 LAB — DIFFERENTIAL
Eosinophils Absolute: 0.1
Eosinophils Relative: 1
Lymphocytes Relative: 16
Lymphs Abs: 1
Monocytes Relative: 12

## 2011-08-08 LAB — APTT: aPTT: 37

## 2011-08-08 LAB — BASIC METABOLIC PANEL
BUN: 8
CO2: 26
Chloride: 106
Potassium: 3.9

## 2011-08-08 LAB — D-DIMER, QUANTITATIVE: D-Dimer, Quant: 0.76 — ABNORMAL HIGH

## 2011-08-08 LAB — POCT I-STAT CREATININE: Creatinine, Ser: 0.9

## 2011-08-10 ENCOUNTER — Ambulatory Visit (INDEPENDENT_AMBULATORY_CARE_PROVIDER_SITE_OTHER): Payer: Medicare Other | Admitting: *Deleted

## 2011-08-10 DIAGNOSIS — I4891 Unspecified atrial fibrillation: Secondary | ICD-10-CM

## 2011-08-10 LAB — BASIC METABOLIC PANEL
BUN: 17 mg/dL (ref 6–23)
CO2: 27 mEq/L (ref 19–32)
Glucose, Bld: 101 mg/dL — ABNORMAL HIGH (ref 70–99)
Potassium: 3.7 mEq/L (ref 3.5–5.1)
Sodium: 145 mEq/L (ref 135–145)

## 2011-08-13 LAB — URINALYSIS, ROUTINE W REFLEX MICROSCOPIC
Bilirubin Urine: NEGATIVE
Hgb urine dipstick: NEGATIVE
Specific Gravity, Urine: 1.013
Urobilinogen, UA: 1

## 2011-08-13 LAB — POCT CARDIAC MARKERS
CKMB, poc: <1 — ABNORMAL LOW
Troponin i, poc: <0.05

## 2011-08-13 LAB — BASIC METABOLIC PANEL
BUN: 15
CO2: 28
Chloride: 105
Glucose, Bld: 89
Potassium: 4.3

## 2011-08-13 LAB — DIFFERENTIAL
Basophils Relative: 1
Eosinophils Absolute: 0
Eosinophils Relative: 1
Lymphs Abs: 1.4

## 2011-08-13 LAB — PROTIME-INR: Prothrombin Time: 17.9 — ABNORMAL HIGH

## 2011-08-13 LAB — CBC
HCT: 43.6
MCHC: 34.7
MCV: 88.6
Platelets: 179
RDW: 13.7

## 2011-08-13 LAB — URINE MICROSCOPIC-ADD ON

## 2011-08-15 ENCOUNTER — Telehealth: Payer: Self-pay | Admitting: *Deleted

## 2011-08-15 ENCOUNTER — Telehealth: Payer: Self-pay | Admitting: Cardiology

## 2011-08-15 NOTE — Telephone Encounter (Signed)
Pt is leaving for the afternoon.  Has questions about medication and labs. Please call her back.  Leave message on the answering machine.  Does she need to continue taking Lisinopril 5 mg and continue to have lab work done?

## 2011-08-15 NOTE — Telephone Encounter (Signed)
Spoke with patient re-medication

## 2011-08-15 NOTE — Telephone Encounter (Signed)
Message copied by Eugenia Pancoast on Wed Aug 15, 2011  1:54 PM ------      Message from: Cassell Clement      Created: Sun Aug 12, 2011  8:28 PM       The potassium is normal now.  Continue present Rx.

## 2011-08-15 NOTE — Telephone Encounter (Signed)
Advised labs ok

## 2011-08-15 NOTE — Telephone Encounter (Signed)
Please call her back she spoke with you about ten minutes

## 2011-08-19 NOTE — Discharge Summary (Signed)
NAMEPRINCELLA, JASKIEWICZ NO.:  [id number]  MEDICAL RECORD NO.:  [id number]  LOCATION:  2029                         FACILITY:  [facility]  PHYSICIAN:  Kari Baars, M.D.  DATE OF BIRTH:  1929-10-18  DATE OF ADMISSION:  07/10/2011 DATE OF DISCHARGE:  07/11/2011                              DISCHARGE SUMMARY   DISCHARGE DIAGNOSES: 1. Atypical chest pain. 2. Urinary tract infection. 3. Abnormal chest x-ray with manubrium/thoracic sclerosis, CT scan     negative. 4. Elevated liver tests. 5. Atrial fibrillation, on anticoagulation. 6. Anxiety disorder. 7. Hypertension. 8. Hyperlipidemia. 9. Osteoporosis. 10.History of shingles. 11.History of left kidney mass with a negative biopsy in 2008. 12.History of nephrolithiasis. 13.Right shoulder fracture status post right hemiarthroplasty (2008). 14.Status post right total knee replacement. 15.Status post hysterectomy. 16.Status post bladder tack. 17.History of lithotripsy (2008). 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 19.History of right olecranon pressure ulcer.  DISCHARGE MEDICATIONS: 1. Vitamin D 1000 units daily. 2. Multivitamin daily. 3. Fish oil 1000 mg daily. 4. Metoprolol 50 mg one half daily. 5. Coumadin 5 mg half tablet on Tuesday, Thursday, and Friday and one     on Monday, Wednesday, Saturday, and Sunday. 6. Crestor 10 mg at bedtime - she was instructed to hold this due to     elevated liver tests. 7. Aricept 10 mg at bedtime. 8. Zoloft 50 mg daily. 9. Cipro 500 mg b.i.d. x7 days.  HOSPITAL PROCEDURES: 1. Chest x-ray (July 10, 2011) cardiomegaly without CHF or     pneumonia.  Chronic left lower lobe scarring.  2-cm sclerotic focus     over the manubrium and thoracic spine, bone lesion not excluded.     Consider CT. 2. CT of the chest with contrast (July 11, 2011) density over the     mid upper chest on recent chest x-ray corresponds to     methylmethacrylate apparently placed in analysis institution for T4     vertebroplasty. 3. Abdominal ultrasound (July 11, 2011) focal gallbladder wall     thickening without evidence for pericholecystic fluid, stones, or     sonographic Murphy's sign may be related to adenomyomatosis.     Otherwise, normal.  HISTORY OF PRESENT ILLNESS:  For full details, please see dictated history and physical by Dr. Timothy Lasso.  Briefly, Ms. Hitch is an 77 year old white female with a history of hypertension, hyperlipidemia, and anxiety who presented to the emergency department with a complaint of chest pain.  She described this is a bleeding pain in the middle of her chest associated with a cold clammy diaphoresis and nausea.  She had recurrent symptoms associated with vomiting and was brought to the emergency department where EKG and cardiac enzymes were negative.  She remained chest pain free in the emergency department.  While there, she developed some shaking chills and rigors and so we were called for admission.  HOSPITAL COURSE:  The patient was admitted to a medical bed.  Urinalysis was consistent with a urinary tract infection, so she was placed empirically on Rocephin, which was changed to Cipro prior to discharge. She had negative cardiac  enzymes x3 ruling out myocardial infarction. She was tender over the sternum and given her abnormalities on chest x- ray, a CT was performed to rule out any bony process.  This was negative.  In addition, liver function tests on the morning after admission came back significantly elevated with a total bilirubin of 1.1, AST 322, ALT 125, alk phos 95.  Given this, an abdominal ultrasound was performed to rule out GI sources.  This did show some mild thickening of the gallbladder, but no apparent cholecystitis.  In addition, she had no tenderness over her right upper quadrant and so cholecystitis was considered less likely.  Her Crestor was held and  the liver tests will be monitored as an outpatient.  The patient remained chest pain free throughout the remainder of her hospitalization.  She had no further fever or chills.  Urine and blood cultures were obtained and were negative at the time of discharge, but still preliminary.  The patient was feeling better and was ready for discharge home.  DISCHARGE LABS:  CBC shows a white count of 4.9, hemoglobin 12.3, platelets 141, INR 2.19.  BMET significant for sodium 143, potassium 3.0, chloride 109, bicarb 26, BUN 16, creatinine 0.71, glucose 79, total bilirubin 1.1, alk phos 95, AST 322, ALT 125, albumin 3.3.  Cardiac enzymes negative x3.  Blood and urine cultures pending.  DISCHARGE INSTRUCTIONS:  The patient was instructed to call if she has increasing abdominal pain, chest pain, fever, chills, or sweats.  HOSPITAL FOLLOWUP:  She will follow up with Dr. Clelia Croft in 1 week at [facility].  INR will need to be checked at that time.  CONDITION ON DISCHARGE:  Improved.  DISPOSITION:  To home.  DISCHARGE DIET:  Cardiac prudent.     Kari Baars, M.D.     WS/MEDQ  D:  08/01/2011  T:  08/01/2011  Job:  [id number]  Electronically Signed by W. Buren Kos M.D. on 08/19/2011 12:41:25 PM

## 2011-08-24 LAB — BASIC METABOLIC PANEL
BUN: 14
Calcium: 9.5
Creatinine, Ser: 0.82
GFR calc non Af Amer: >60
Glucose, Bld: 85

## 2011-08-24 LAB — PROTIME-INR
INR: 1.5
INR: 2 — ABNORMAL HIGH
INR: 2.7 — ABNORMAL HIGH
Prothrombin Time: 18.4 — ABNORMAL HIGH
Prothrombin Time: 20.6 — ABNORMAL HIGH
Prothrombin Time: 23.6 — ABNORMAL HIGH
Prothrombin Time: 29.8 — ABNORMAL HIGH

## 2011-08-24 LAB — CBC
HCT: 33.9 — ABNORMAL LOW
Platelets: 295
RDW: 15.5
WBC: 6.6

## 2011-08-27 LAB — BASIC METABOLIC PANEL
BUN: 10
BUN: 11
BUN: 13
BUN: 15
BUN: 17
CO2: 26
CO2: 27
Calcium: 8.5
Calcium: 8.6
Calcium: 8.7
Chloride: 105
Chloride: 105
Creatinine, Ser: 0.65
Creatinine, Ser: 0.81
Creatinine, Ser: 0.87
Creatinine, Ser: 0.87
GFR calc Af Amer: >60
GFR calc non Af Amer: >60
GFR calc non Af Amer: >60
GFR calc non Af Amer: >60
Glucose, Bld: 115 — ABNORMAL HIGH
Glucose, Bld: 84
Glucose, Bld: 99
Potassium: 3.8
Potassium: 4
Sodium: 140

## 2011-08-27 LAB — CBC
HCT: 39.8
Hemoglobin: 11.1 — ABNORMAL LOW
MCHC: 33.7
MCHC: 33.8
MCHC: 34
MCV: 91
MCV: 91.1
MCV: 91.6
MCV: 92
Platelets: 154
Platelets: 156
Platelets: 197
RBC: 2.71 — ABNORMAL LOW
RDW: 15.5
RDW: 15.6 — ABNORMAL HIGH
RDW: 15.7 — ABNORMAL HIGH
WBC: 5.2

## 2011-08-27 LAB — CROSSMATCH

## 2011-08-27 LAB — PROTIME-INR
INR: 1.5
INR: 2.2 — ABNORMAL HIGH
Prothrombin Time: 16.9 — ABNORMAL HIGH
Prothrombin Time: 18.6 — ABNORMAL HIGH
Prothrombin Time: 23.7 — ABNORMAL HIGH
Prothrombin Time: 24.8 — ABNORMAL HIGH
Prothrombin Time: 25.9 — ABNORMAL HIGH
Prothrombin Time: 28.4 — ABNORMAL HIGH

## 2011-08-27 LAB — DIFFERENTIAL
Basophils Absolute: 0
Basophils Relative: 0
Eosinophils Absolute: 0.1 — ABNORMAL LOW
Eosinophils Relative: 2
Monocytes Absolute: 0.5
Neutro Abs: 4.7

## 2011-08-27 LAB — ABO/RH: ABO/RH(D): O POS

## 2011-08-27 LAB — APTT: aPTT: 31

## 2011-08-28 LAB — SAMPLE TO BLOOD BANK

## 2011-08-28 LAB — PROTIME-INR: INR: 3.5 — ABNORMAL HIGH

## 2011-08-30 ENCOUNTER — Telehealth: Payer: Self-pay | Admitting: Cardiology

## 2011-08-30 NOTE — Telephone Encounter (Signed)
Pt calling wanting to talk to nurse about recent medication pt was pt on. Please return pt call to discuss further.

## 2011-08-30 NOTE — Telephone Encounter (Signed)
Patient phoned wanting to know if she needed to continue Lisinopril that was recently prescribed.  Advised to continue all medications Rx'd by  Dr. Patty Sermons until seen in November and will discuss then.  Monitor blood pressure and if systolic below 100 call office

## 2011-09-04 LAB — CBC
Hemoglobin: 13.4
RBC: 4.76
WBC: 4.6

## 2011-09-04 LAB — PROTIME-INR
INR: 1.2
Prothrombin Time: 15.8 — ABNORMAL HIGH

## 2011-09-25 ENCOUNTER — Ambulatory Visit (INDEPENDENT_AMBULATORY_CARE_PROVIDER_SITE_OTHER): Payer: Medicare Other | Admitting: Cardiology

## 2011-09-25 ENCOUNTER — Telehealth: Payer: Self-pay | Admitting: Cardiology

## 2011-09-25 ENCOUNTER — Encounter: Payer: Self-pay | Admitting: Cardiology

## 2011-09-25 VITALS — BP 110/70 | HR 90 | Ht 65.5 in | Wt 134.8 lb

## 2011-09-25 DIAGNOSIS — F419 Anxiety disorder, unspecified: Secondary | ICD-10-CM

## 2011-09-25 DIAGNOSIS — E7800 Pure hypercholesterolemia, unspecified: Secondary | ICD-10-CM

## 2011-09-25 DIAGNOSIS — K4090 Unilateral inguinal hernia, without obstruction or gangrene, not specified as recurrent: Secondary | ICD-10-CM

## 2011-09-25 DIAGNOSIS — I4891 Unspecified atrial fibrillation: Secondary | ICD-10-CM

## 2011-09-25 MED ORDER — DIAZEPAM 5 MG PO TABS: 5.0000 mg | ORAL_TABLET | ORAL | Status: DC | PRN

## 2011-09-25 NOTE — Telephone Encounter (Signed)
Pt calling re visit today

## 2011-09-25 NOTE — Assessment & Plan Note (Signed)
The patient remains on Crestor 10 mg daily for hypercholesterolemia.  She is not experiencing any myalgias or side effects from the Crestor

## 2011-09-25 NOTE — Telephone Encounter (Signed)
Wanted to know if she needed to continue Lisinopril, advised yes

## 2011-09-25 NOTE — Assessment & Plan Note (Signed)
The patient has noted a bulge in the right inguinal area.  When she stands up or when she coughs the bulge becomes more prominent.  Examination reveals an egg-sized right inguinal hernia.  It is nontender.  We will observe it.  She is not interested in any surgery at this point.  If he becomes symptomatic we will need to revisit the idea of possible surgery.

## 2011-09-25 NOTE — Progress Notes (Signed)
Penny [name] Date of Birth:  Dec 16, 1928 [facility] [address]   [address] [place], [place]  [address] [phone]           Fax   [phone]  History of Present Illness: This pleasant 77 year old woman is seen for a scheduled followup office visit.  She has a past history of established atrial fibrillation.  He also has a history of right bundle branch block and hypercholesterolemia.  His last visit she has been doing well.  Not been experiencing any chest pain or shortness of breath.  She's had no thromboembolic episodes.  She's had no symptoms of CHF.  Current Outpatient Prescriptions  Medication Sig Dispense Refill  . Ascorbic Acid (VITAMIN C) 500 MG tablet Take 500 mg by mouth daily.        . calcium carbonate (OS-CAL) 600 MG TABS Take 600 mg by mouth 2 (two) times daily with a meal.        . diazepam (VALIUM) 5 MG tablet Take 1 tablet (5 mg total) by mouth as needed.  30 tablet  5  . docusate sodium (COLACE) 100 MG capsule Take 100 mg by mouth 2 (two) times daily. As needed       . donepezil (ARICEPT) 10 MG tablet Take 10 mg by mouth at bedtime as needed.        [name] lisinopril (PRINIVIL,ZESTRIL) 5 MG tablet Take 1 tablet (5 mg total) by mouth daily.  30 tablet  11  . loratadine (CLARITIN) 10 MG tablet Take 10 mg by mouth daily. As needed       . metoprolol succinate (TOPROL-XL) 25 MG 24 hr tablet Take 12.5 mg by mouth daily.        . multivitamin (THERAGRAN) per tablet Take 1 tablet by mouth daily.        . Omega-3 Fatty Acids (FISH OIL PO) Take 400 mg by mouth daily. Taking 1400 daily      . rosuvastatin (CRESTOR) 10 MG tablet Take 0.5 tablets (5 mg total) by mouth daily. Or as directed by physician  90 tablet  1  . warfarin (COUMADIN) 5 MG tablet Take 1 tablet (5 mg total) by mouth daily. As directed by the [facility]  90 tablet  3    Allergies  Allergen Reactions  . Digoxin And Related   . Zocor (Simvastatin)   . Sulfa Antibiotics Rash     Patient Active Problem List  Diagnoses  . Atrial fibrillation  . Long term current use of anticoagulant  . Malaise and fatigue  . Right bundle branch block  . Hypercholesterolemia  . Dizziness - light-headed    History  Smoking status  . Former Smoker  Smokeless tobacco  . Not on file    History  Alcohol Use No    Family History  Problem Relation Age of Onset  . Heart disease Mother     Review of Systems: Constitutional: no fever chills diaphoresis or fatigue or change in weight.  Head and neck: no hearing loss, no epistaxis, no photophobia or visual disturbance. Respiratory: No cough, shortness of breath or wheezing. Cardiovascular: No chest pain peripheral edema, palpitations. Gastrointestinal: No abdominal distention, no abdominal pain, no change in bowel habits hematochezia or melena. Genitourinary: No dysuria, no frequency, no urgency, no nocturia. Musculoskeletal:No arthralgias, no back pain, no gait disturbance or myalgias. Neurological: No dizziness, no headaches, no numbness, no seizures, no syncope, no weakness, no tremors. Hematologic: No lymphadenopathy, no easy  bruising. Psychiatric: No confusion, no hallucinations, no sleep disturbance.    Physical Exam: Filed Vitals:   09/25/11 0956  BP: 110/70  Pulse: 90   Gen. appearance reveals a well-developed well-nourished woman in no distress.Pupils equal and reactive.   Extraocular Movements are full.  There is no scleral icterus.  The mouth and pharynx are normal.  The neck is supple.  The carotids reveal no bruits.  The jugular venous pressure is normal.  The thyroid is not enlarged.  There is no lymphadenopathy.  The chest is clear to percussion and auscultation. There are no rales or rhonchi. Expansion of the chest is symmetrical.  The precordium is quiet.  The first heart sound is normal.  The second heart sound is physiologically split.  There is no murmur gallop rub or click.  There is no abnormal  lift or heave.  Rhythm is irregular in atrial fibrillation. The abdomen is soft and nontender. Bowel sounds are normal. The liver and spleen are not enlarged. There Are no abdominal masses. There are no bruits.  There is a small egg-shaped educible right inguinal hernia prominent when she stands up and when she coughs. The pedal pulses are good.  There is no phlebitis or edema.  There is no cyanosis or clubbing. Strength is normal and symmetrical in all extremities.  There is no lateralizing weakness.  There are no sensory deficits.  The skin is warm and dry.  There is no rash.       Assessment / Plan: Continue same medication.  Recheck in 3 months for office visit and EKG

## 2011-09-25 NOTE — Patient Instructions (Signed)
Refilled Valium to Penny [name] physician recommends that you continue on your current medications as directed. Please refer to the Current Medication list given to you today. Your physician recommends that you schedule a follow-up appointment in: 3 months with Lawson Fiscal or Dr Patty Sermons

## 2011-09-25 NOTE — Assessment & Plan Note (Signed)
The patient has established atrial fibrillation.  She's not been aware of any racing of her heart.  She is on Coumadin and this is managed through her primary care physician Dr. Clelia Croft.

## 2011-12-05 ENCOUNTER — Ambulatory Visit (INDEPENDENT_AMBULATORY_CARE_PROVIDER_SITE_OTHER): Payer: Medicare Other | Admitting: Cardiology

## 2011-12-05 ENCOUNTER — Encounter: Payer: Self-pay | Admitting: Cardiology

## 2011-12-05 VITALS — BP 100/70 | HR 66 | Ht 65.0 in | Wt 128.0 lb

## 2011-12-05 DIAGNOSIS — R42 Dizziness and giddiness: Secondary | ICD-10-CM

## 2011-12-05 DIAGNOSIS — E7800 Pure hypercholesterolemia, unspecified: Secondary | ICD-10-CM

## 2011-12-05 DIAGNOSIS — I4891 Unspecified atrial fibrillation: Secondary | ICD-10-CM

## 2011-12-05 MED ORDER — DIGOXIN 125 MCG PO TABS: ORAL_TABLET | ORAL | Status: DC

## 2011-12-05 NOTE — Assessment & Plan Note (Signed)
The patient has a tendency toward low blood pressure.  We are not able to push her beta blockers hard because of this.  We will add digoxin for additional rate slowing and continued low-dose beta blocker.

## 2011-12-05 NOTE — Assessment & Plan Note (Signed)
The patient complains of rapid heart action.  I checked her pulse myself at rest and it was 85.  Several years ago she was on digoxin for several months.  It was subsequently stopped up it would be worth trying again in low dose to see if it would help her sense of palpitations

## 2011-12-05 NOTE — Patient Instructions (Signed)
Will start digoxin .125 mg daily except none on Wednesday and Sunday Your physician wants you to follow-up in: 3 months You will receive a reminder letter in the mail two months in advance. If you don't receive a letter, please call our office to schedule the follow-up appointment.

## 2011-12-05 NOTE — Assessment & Plan Note (Signed)
The patient has a history of hypercholesterolemia.  She is not on statin therapy is on omega-3 fatty acids.  Her lipids were checked by her primary care provider Dr. Clelia Croft

## 2011-12-05 NOTE — Progress Notes (Signed)
Penny [name] Date of Birth:  03-29-1929 [name] [address] [place], [place]  [address] [phone]         Fax   [phone]  History of Present Illness: This pleasant 78 year old woman is seen for a scheduled followup office visit.  As a history of established atrial fibrillation.  She has a history of known right bundle branch block and a history of hypercholesterolemia.  She has been showing some evidence of decreased memory recently.  She complains of lack of energy.  She also complains that she feels that her pulse was racing even when she first wakes up in the morning.  Current Outpatient Prescriptions  Medication Sig Dispense Refill  . Ascorbic Acid (VITAMIN C) 500 MG tablet Take 500 mg by mouth daily.        . calcium carbonate (OS-CAL) 600 MG TABS Take 600 mg by mouth 2 (two) times daily with a meal.        . Cholecalciferol (VITAMIN D PO) Take 2,000 Units by mouth daily.      . Cyanocobalamin (VITAMIN B 12 PO) Take by mouth daily.      . diazepam (VALIUM) 5 MG tablet Take 1 tablet (5 mg total) by mouth as needed.  90 tablet  1  . docusate sodium (COLACE) 100 MG capsule Take 100 mg by mouth 2 (two) times daily. As needed       . donepezil (ARICEPT) 10 MG tablet Take 10 mg by mouth at bedtime as needed.        [name] lisinopril (PRINIVIL,ZESTRIL) 5 MG tablet Take 1 tablet (5 mg total) by mouth daily.  30 tablet  11  . meclizine (ANTIVERT) 25 MG tablet Take 25 mg by mouth 3 (three) times daily as needed. Taking as needed      . metoprolol succinate (TOPROL-XL) 25 MG 24 hr tablet Take 12.5 mg by mouth daily.        . multivitamin (THERAGRAN) per tablet Take 1 tablet by mouth daily.        . Omega-3 Fatty Acids (FISH OIL PO) Take 400 mg by mouth daily. Taking 1400 daily      . rosuvastatin (CRESTOR) 10 MG tablet Take 0.5 tablets (5 mg total) by mouth daily. Or as directed by physician  90 tablet  1  . warfarin (COUMADIN) 5 MG tablet Take 1 tablet (5 mg total)  by mouth daily. As directed by the [facility]  90 tablet  3  . digoxin (LANOXIN) 0.125 MG tablet Take 1 tablet daily except none on Wednesday and Sunday or as directed  30 tablet  11    Allergies  Allergen Reactions  . Zocor (Simvastatin)   . Sulfa Antibiotics Rash    Patient Active Problem List  Diagnoses  . Atrial fibrillation  . Long term current use of anticoagulant  . Malaise and fatigue  . Right bundle branch block  . Hypercholesterolemia  . Dizziness - light-headed  . Inguinal hernia    History  Smoking status  . Former Smoker  Smokeless tobacco  . Not on file    History  Alcohol Use No    Family History  Problem Relation Age of Onset  . Heart disease Mother     Review of Systems: Constitutional: no fever chills diaphoresis or fatigue or change in weight.  Head and neck: no hearing loss, no epistaxis, no photophobia or visual disturbance. Respiratory: No cough, shortness of breath or wheezing. Cardiovascular: No  chest pain peripheral edema, palpitations. Gastrointestinal: No abdominal distention, no abdominal pain, no change in bowel habits hematochezia or melena. Genitourinary: No dysuria, no frequency, no urgency, no nocturia. Musculoskeletal:No arthralgias, no back pain, no gait disturbance or myalgias. Neurological: No dizziness, no headaches, no numbness, no seizures, no syncope, no weakness, no tremors. Hematologic: No lymphadenopathy, no easy bruising. Psychiatric: No confusion, no hallucinations, no sleep disturbance.    Physical Exam: Filed Vitals:   12/05/11 1557  BP: 100/70  Pulse: 66   the general appearance reveals a well-developed well-nourished elderly woman in no distress.  She is somewhat confused about her medicines and her medical condition.  Her memory appears to be declining.Pupils equal and reactive.   Extraocular Movements are full.  There is no scleral icterus.  The mouth and pharynx are normal.  The neck is supple.  The  carotids reveal no bruits.  The jugular venous pressure is normal.  The thyroid is not enlarged.  There is no lymphadenopathy.  The chest is clear to percussion and auscultation. There are no rales or rhonchi. Expansion of the chest is symmetrical.  The precordium is quiet.  The first heart sound is normal.  The second heart sound is physiologically split.  There is no murmur gallop rub or click.  There is no abnormal lift or heave.  The pulse is irregular at 85 per  EKG  The abdomen is soft and nontender. Bowel sounds are normal. The liver and spleen are not enlarged. There Are no abdominal masses. There are no bruits.  The pedal pulses are good.  There is no phlebitis or edema.  There is no cyanosis or clubbing. Strength is normal and symmetrical in all extremities.  There is no lateralizing weakness.  There are no sensory deficits.     Assessment / Plan:  Continue same medication except add digoxin 0.125 mg daily 5 of 7 days skipping Wednesday and Sunday.  Recheck in 3 months for followup office visit and EKG

## 2011-12-26 ENCOUNTER — Ambulatory Visit: Payer: Self-pay | Admitting: Cardiology

## 2012-03-04 ENCOUNTER — Ambulatory Visit (INDEPENDENT_AMBULATORY_CARE_PROVIDER_SITE_OTHER): Payer: Medicare Other | Admitting: Cardiology

## 2012-03-04 ENCOUNTER — Encounter: Payer: Self-pay | Admitting: Cardiology

## 2012-03-04 VITALS — BP 110/70 | HR 89 | Ht 65.0 in | Wt 121.0 lb

## 2012-03-04 DIAGNOSIS — F419 Anxiety disorder, unspecified: Secondary | ICD-10-CM

## 2012-03-04 DIAGNOSIS — R634 Abnormal weight loss: Secondary | ICD-10-CM

## 2012-03-04 DIAGNOSIS — E7800 Pure hypercholesterolemia, unspecified: Secondary | ICD-10-CM

## 2012-03-04 DIAGNOSIS — I4891 Unspecified atrial fibrillation: Secondary | ICD-10-CM

## 2012-03-04 DIAGNOSIS — F411 Generalized anxiety disorder: Secondary | ICD-10-CM

## 2012-03-04 DIAGNOSIS — G4700 Insomnia, unspecified: Secondary | ICD-10-CM

## 2012-03-04 DIAGNOSIS — I4510 Unspecified right bundle-branch block: Secondary | ICD-10-CM

## 2012-03-04 MED ORDER — DIAZEPAM 5 MG PO TABS: 5.0000 mg | ORAL_TABLET | ORAL | Status: DC | PRN

## 2012-03-04 NOTE — Patient Instructions (Signed)
Your physician recommends that you continue on your current medications as directed. Please refer to the Current Medication list given to you today.  Your physician recommends that you schedule a follow-up appointment in: 4 months  

## 2012-03-04 NOTE — Assessment & Plan Note (Signed)
Her lipids are being monitored through Dr. Alver Fisher office.

## 2012-03-04 NOTE — Progress Notes (Signed)
Penny [name] Date of Birth:  November 20, 1928 [facility] [address] [place], [place]  [address] [phone]  Fax   [phone]  HPI: This pleasant 87 woman is seen for a scheduled followup office visit.  She has chronic atrial fibrillation.  He is on long-term Coumadin which is managed by Dr. Clelia Croft.  She has a history of an asymptomatic right bundle branch block.  Has had a history of some memory problems.  Since we last saw her she became a great-grandmother of twins. Current Outpatient Prescriptions  Medication Sig Dispense Refill  . Ascorbic Acid (VITAMIN C) 500 MG tablet Take 500 mg by mouth daily.        . calcium carbonate (OS-CAL) 600 MG TABS Take 600 mg by mouth 2 (two) times daily with a meal.        . Cholecalciferol (VITAMIN D PO) Take 2,000 Units by mouth daily.      . Cyanocobalamin (VITAMIN B 12 PO) Take by mouth daily.      . diazepam (VALIUM) 5 MG tablet Take 1 tablet (5 mg total) by mouth as needed.  90 tablet  1  . digoxin (LANOXIN) 0.125 MG tablet Take 1 tablet daily except none on Wednesday and Sunday or as directed  30 tablet  11  . docusate sodium (COLACE) 100 MG capsule Take 100 mg by mouth 2 (two) times daily. As needed       . donepezil (ARICEPT) 10 MG tablet Take 10 mg by mouth at bedtime as needed.        [name] lisinopril (PRINIVIL,ZESTRIL) 5 MG tablet Take 1 tablet (5 mg total) by mouth daily.  30 tablet  11  . meclizine (ANTIVERT) 25 MG tablet Take 25 mg by mouth 3 (three) times daily as needed. Taking as needed      . metoprolol succinate (TOPROL-XL) 25 MG 24 hr tablet Take 12.5 mg by mouth daily.        . multivitamin (THERAGRAN) per tablet Take 1 tablet by mouth daily.        . Omega-3 Fatty Acids (FISH OIL PO) Take 400 mg by mouth daily. Taking 1400 daily      . rosuvastatin (CRESTOR) 10 MG tablet Take 0.5 tablets (5 mg total) by mouth daily. Or as directed by physician  90 tablet  1  . warfarin (COUMADIN) 5 MG tablet Take 1 tablet (5 mg  total) by mouth daily. As directed by the [facility]  90 tablet  3    Allergies  Allergen Reactions  . Zocor (Simvastatin)   . Sulfa Antibiotics Rash    Patient Active Problem List  Diagnoses  . Atrial fibrillation  . Long term current use of anticoagulant  . Malaise and fatigue  . Right bundle branch block  . Hypercholesterolemia  . Dizziness - light-headed  . Inguinal hernia    History  Smoking status  . Former Smoker  Smokeless tobacco  . Not on file    History  Alcohol Use No    Family History  Problem Relation Age of Onset  . Heart disease Mother     Review of Systems: The patient denies any heat or cold intolerance.  No weight gain or weight loss.  The patient denies headaches or blurry vision.  There is no cough or sputum production.  The patient denies dizziness.  There is no hematuria or hematochezia.  The patient denies any muscle aches or arthritis.  The patient denies any rash.  The patient denies frequent falling or instability.  There is no history of depression or anxiety.  All other systems were reviewed and are negative.   Physical Exam: Filed Vitals:   03/04/12 1023  BP: 110/70  Pulse: 89   the general appearance reveals a well-developed well-nourished woman in no distress.  She does have some mild memory impairment and tends to repeat the same questions and statements.  Her husband was with her in the office today as usual. Pupils equal and reactive.   Extraocular Movements are full.  There is no scleral icterus.  The mouth and pharynx are normal.  The neck is supple.  The carotids reveal no bruits.  The jugular venous pressure is normal.  The thyroid is not enlarged.  There is no lymphadenopathy.  The chest is clear to percussion and auscultation. There are no rales or rhonchi. Expansion of the chest is symmetrical.  The precordium is quiet.  The first heart sound is normal.  The second heart sound is physiologically split.  There is no  murmur gallop rub or click.  There is no abnormal lift or heave.  It is irregular The abdomen is soft and nontender. Bowel sounds are normal. The liver and spleen are not enlarged. There Are no abdominal masses. There are no bruits.  The pedal pulses are good.  There is no phlebitis or edema.  There is no cyanosis or clubbing. The skin is warm and dry.  There is no rash.   EKG today shows atrial fibrillation with a ventricular response of 89 per minute.  She has low voltage QRS and right bundle branch block which is old.   Assessment / Plan:  Continue same medication.  Recheck in 4 months for followup office visit.  We refilled her Valium today.  She uses it before going to social functions and to church and other situations where she tends to get anxious.

## 2012-03-04 NOTE — Assessment & Plan Note (Signed)
The patient is a 7 pound since last visit.  She is not trying to lose weight.  She is clinically euthyroid.  Her husband will offer her some Ensure to keep her from losing more weight.  She denies any change in bowel habits etc.

## 2012-03-04 NOTE — Assessment & Plan Note (Signed)
Is not having any symptoms referable to her atrial fibrillation.  She is now on low-dose digoxin for additional rate control.  Her recent echocardiogram had shown an ejection fraction of 45-50% and she is now on lisinopril and appears to be tolerating it well.

## 2012-04-29 ENCOUNTER — Other Ambulatory Visit: Payer: Self-pay | Admitting: Cardiology

## 2012-04-30 ENCOUNTER — Telehealth: Payer: Self-pay | Admitting: Cardiology

## 2012-04-30 NOTE — Telephone Encounter (Signed)
Patient request return call at [phone] regarding coumadin

## 2012-04-30 NOTE — Telephone Encounter (Signed)
Discussed with patient and she will get from Dr Clelia Croft since he follows her protimes

## 2012-05-19 ENCOUNTER — Telehealth: Payer: Self-pay | Admitting: Cardiology

## 2012-05-19 ENCOUNTER — Encounter: Payer: Self-pay | Admitting: Cardiology

## 2012-05-19 ENCOUNTER — Telehealth: Payer: Self-pay | Admitting: *Deleted

## 2012-05-19 NOTE — Telephone Encounter (Signed)
Called pt with sooner appt. Due to DOD call from Dr. Clelia Croft to Dr. Ladona Ridgel today. Appt made for 05/28/12 with Dr. Patty Sermons. I did not cancel the 07/09/12 appt at this time. Mylo Red RN

## 2012-05-19 NOTE — Telephone Encounter (Signed)
Spoke with Dr Ladona Ridgel and he thinks ok to wait until next week

## 2012-05-19 NOTE — Telephone Encounter (Signed)
New Problem:    Patient called in wondering when she had her last EKG.  Please call back.

## 2012-05-19 NOTE — Telephone Encounter (Signed)
Faxed copy to Dr Clelia Croft, patient seeing him today for syncope over weekend. Feels ok today per patient

## 2012-05-28 ENCOUNTER — Encounter: Payer: Self-pay | Admitting: Cardiology

## 2012-05-28 ENCOUNTER — Ambulatory Visit (INDEPENDENT_AMBULATORY_CARE_PROVIDER_SITE_OTHER): Payer: Medicare Other | Admitting: Cardiology

## 2012-05-28 VITALS — BP 100/68 | HR 78 | Ht 65.0 in | Wt 121.0 lb

## 2012-05-28 DIAGNOSIS — F419 Anxiety disorder, unspecified: Secondary | ICD-10-CM

## 2012-05-28 DIAGNOSIS — F411 Generalized anxiety disorder: Secondary | ICD-10-CM

## 2012-05-28 DIAGNOSIS — R55 Syncope and collapse: Secondary | ICD-10-CM | POA: Insufficient documentation

## 2012-05-28 DIAGNOSIS — I4891 Unspecified atrial fibrillation: Secondary | ICD-10-CM

## 2012-05-28 MED ORDER — DIAZEPAM 5 MG PO TABS: 5.0000 mg | ORAL_TABLET | ORAL | Status: DC | PRN

## 2012-05-28 NOTE — Assessment & Plan Note (Signed)
Patient has not been aware of any chest pain.  She is not aware of any racing of her heart.  EKG today shows atrial fibrillation with a controlled ventricular rate of 77.

## 2012-05-28 NOTE — Assessment & Plan Note (Signed)
The patient continues to complain of being tired all the time.  She does have worsening dementia.  She tends to repeat herself over and over.  Her fatigue is probably multifactorial

## 2012-05-28 NOTE — Progress Notes (Signed)
Penny [name] Date of Birth:  01/14/1929 [facility] [address] [place], [place]  [address] [phone]         Fax   [phone]  History of Present Illness: This pleasant 78 year old woman is seen ahead of schedule because of a recent episode of syncope.  10 days ago while she was getting ready for church and washing her face she had sudden brief loss of consciousness.  There was no warning.  She did not seek medical attention that day.  He did suffer a minor bruise to her right forehead.  She went to see her primary care provider Dr. Clelia Croft the next day and the blood work was obtained.  Her blood pressure that day was low in the range of 94 systolic and for that reason her lisinopril was stopped.  Since then the patient has had no further episodes of dizziness or syncope.  Her blood pressure has been remaining low normal.  She complains of feeling weak and tired.  Lab work done at Dr. Alver Fisher office did not show any cause for her syncope.  She was not anemic.  Her electrolytes were stable.  Current Outpatient Prescriptions  Medication Sig Dispense Refill  . Ascorbic Acid (VITAMIN C) 500 MG tablet Take 500 mg by mouth daily.        . calcium carbonate (OS-CAL) 600 MG TABS Take 600 mg by mouth 2 (two) times daily with a meal.        . Cholecalciferol (VITAMIN D PO) Take 2,000 Units by mouth daily.      . Cyanocobalamin (VITAMIN B 12 PO) Take by mouth daily.      . diazepam (VALIUM) 5 MG tablet Take 1 tablet (5 mg total) by mouth as needed.  90 tablet  1  . digoxin (LANOXIN) 0.125 MG tablet Take 1 tablet daily except none on Wednesday and Sunday or as directed  30 tablet  11  . docusate sodium (COLACE) 100 MG capsule Take 100 mg by mouth 2 (two) times daily. As needed       . donepezil (ARICEPT) 10 MG tablet Take 10 mg by mouth at bedtime as needed.        . meclizine (ANTIVERT) 25 MG tablet Take 25 mg by mouth 3 (three) times daily as needed. Taking as needed      .  metoprolol succinate (TOPROL-XL) 25 MG 24 hr tablet Take 12.5 mg by mouth daily.        . multivitamin (THERAGRAN) per tablet Take 1 tablet by mouth daily.        . Omega-3 Fatty Acids (FISH OIL PO) Take 400 mg by mouth daily. Taking 1400 daily      . rosuvastatin (CRESTOR) 10 MG tablet Take 0.5 tablets (5 mg total) by mouth daily. Or as directed by physician  90 tablet  1  . warfarin (COUMADIN) 5 MG tablet Take 1 tablet (5 mg total) by mouth daily. As directed by the [facility]  90 tablet  3    Allergies  Allergen Reactions  . Zocor (Simvastatin)   . Sulfa Antibiotics Rash    Patient Active Problem List  Diagnosis  . Atrial fibrillation  . Long term current use of anticoagulant  . Malaise and fatigue  . Right bundle branch block  . Hypercholesterolemia  . Dizziness - light-headed  . Inguinal hernia  . Weight loss, unintentional  . Syncope and collapse    History  Smoking status  .  Former Smoker  Smokeless tobacco  . Not on file    History  Alcohol Use No    Family History  Problem Relation Age of Onset  . Heart disease Mother     Review of Systems: Constitutional: no fever chills diaphoresis or fatigue or change in weight.  Head and neck: no hearing loss, no epistaxis, no photophobia or visual disturbance. Respiratory: No cough, shortness of breath or wheezing. Cardiovascular: No chest pain peripheral edema, palpitations. Gastrointestinal: No abdominal distention, no abdominal pain, no change in bowel habits hematochezia or melena. Genitourinary: No dysuria, no frequency, no urgency, no nocturia. Musculoskeletal:No arthralgias, no back pain, no gait disturbance or myalgias. Neurological: No dizziness, no headaches, no numbness, no seizures, no syncope, no weakness, no tremors. Hematologic: No lymphadenopathy, no easy bruising. Psychiatric: No confusion, no hallucinations, no sleep disturbance.    Physical Exam: Filed Vitals:   05/28/12 1044  BP: 100/68   Pulse: 78   the general appearance reveals a well-developed well-nourished woman in no distress.  He has not lost any more weight since last visit.  Her weight is unchanged.Pupils equal and reactive.   Extraocular Movements are full.  There is no scleral icterus.  The mouth and pharynx are normal.  The neck is supple.  The carotids reveal no bruits.  The jugular venous pressure is normal.  The thyroid is not enlarged.  There is no lymphadenopathy.  The bruise on her forehead is resolving and there is no evidence of any laceration or swelling.The chest is clear to percussion and auscultation. There are no rales or rhonchi. Expansion of the chest is symmetrical.  Heart reveals an irregular rate without murmur gallop rub or click. The abdomen is soft and nontender. Bowel sounds are normal. The liver and spleen are not enlarged. There Are no abdominal masses. There are no bruits.  The pedal pulses are good.  There is no phlebitis or edema.  There is no cyanosis or clubbing. Strength is normal and symmetrical in all extremities.  There is no lateralizing weakness.  There are no sensory deficits.  Her repeating the same questions and statements is a manifestation of her dementia. The skin is warm and dry.  There is no rash.   EKG today shows atrial fibrillation with right bundle branch block and a controlled ventricular response.  No ischemic changes.     Assessment / Plan: She will continue her same medication.  I want her to drink more water and also to increase her dietary salt slightly so as to support a higher resting blood pressure.  If she has any further episodes of dizziness or syncope we will consider an event monitor.  Otherwise recheck her in one month for a followup office visit.

## 2012-05-28 NOTE — Patient Instructions (Addendum)
Increase your water intake  Increase your salt intake a little  If your symptoms recur, call back for possible 30 day monitor  Your physician recommends that you schedule a follow-up appointment in: 1 month

## 2012-05-28 NOTE — Assessment & Plan Note (Signed)
Her blood pressure here today was 100/68 sitting and I had her stand up and recheck it and there was no significant change.  We did not demonstrate any orthostatic drop today.  I suspect that her syncope may have been from the previous low blood pressure related to lisinopril which she is now off.

## 2012-06-04 ENCOUNTER — Other Ambulatory Visit: Payer: Self-pay | Admitting: Cardiology

## 2012-06-05 MED ORDER — ROSUVASTATIN CALCIUM 10 MG PO TABS: 5.0000 mg | ORAL_TABLET | Freq: Every day | ORAL | Status: DC

## 2012-06-05 NOTE — Telephone Encounter (Signed)
Refilled crestor 

## 2012-07-01 ENCOUNTER — Encounter: Payer: Self-pay | Admitting: Cardiology

## 2012-07-01 ENCOUNTER — Ambulatory Visit (INDEPENDENT_AMBULATORY_CARE_PROVIDER_SITE_OTHER): Payer: Medicare Other | Admitting: Cardiology

## 2012-07-01 VITALS — BP 110/68 | HR 87 | Ht 65.0 in | Wt 122.4 lb

## 2012-07-01 DIAGNOSIS — I4891 Unspecified atrial fibrillation: Secondary | ICD-10-CM

## 2012-07-01 DIAGNOSIS — I951 Orthostatic hypotension: Secondary | ICD-10-CM

## 2012-07-01 DIAGNOSIS — R634 Abnormal weight loss: Secondary | ICD-10-CM

## 2012-07-01 DIAGNOSIS — R55 Syncope and collapse: Secondary | ICD-10-CM

## 2012-07-01 NOTE — Assessment & Plan Note (Signed)
The patient has not been aware as much of malaise and fatigue this time.  She did have a recent full battery of blood work at Dr. Margart Sickles office in early July which did not show any cause 4 weakness such as anemia and her electrolytes at that time were also stable.

## 2012-07-01 NOTE — Patient Instructions (Addendum)
Your physician recommends that you continue on your current medications as directed. Please refer to the Current Medication list given to you today.  Your physician recommends that you schedule a follow-up appointment in: 4 month ov/ekg 

## 2012-07-01 NOTE — Assessment & Plan Note (Signed)
Her previous unintentional weight loss appears to have plateaued and in fact her weight is up 1 pound since last visit and her appetite is better.

## 2012-07-01 NOTE — Assessment & Plan Note (Signed)
No TIA symptoms. 

## 2012-07-01 NOTE — Assessment & Plan Note (Signed)
The patient has had no further episodes of severe dizziness or syncope.  I checked her standing blood pressure today myself in the left arm and it was 120/74.

## 2012-07-01 NOTE — Progress Notes (Signed)
Penny [name] Date of Birth:  1929/04/16 [facility] [address] [place], [place]  [address] [phone]         Fax   [phone]  History of Present Illness: This pleasant elderly woman is seen for a scheduled followup office visit.  She has a history of chronic atrial fibrillation. She also has a past history of syncope and collapse which occurred at about the beginning of July 2013.  She was found to have low blood pressure following that episode and her lisinopril was stopped.  Since then the patient has had no recurrent dizziness or syncope her blood pressure has been stable.  She denies any recent chest pain.  She has not been experiencing much palpitations or sensation of racing of her heart.  She has not had to take any sublingual nitroglycerin.  Her appetite is good and her weight is up 1 pound since last visit.  She is trying to get more exercise and has been doing some outdoor walking with her husband.  She has difficulty sleeping at night and takes a Valium at bedtime frequently.  Current Outpatient Prescriptions  Medication Sig Dispense Refill  . Ascorbic Acid (VITAMIN C) 500 MG tablet Take 500 mg by mouth daily.        . calcium carbonate (OS-CAL) 600 MG TABS Take 600 mg by mouth 2 (two) times daily with a meal.        . Cholecalciferol (VITAMIN D PO) Take 2,000 Units by mouth daily.      . CRESTOR 10 MG tablet TAKE ONE-HALF TABLET BY MOUTH EVERY DAY OR AS DIRECTED BY PHYSICAN  90 each  1  . Cyanocobalamin (VITAMIN B 12 PO) Take by mouth daily.      . diazepam (VALIUM) 5 MG tablet Take 1 tablet (5 mg total) by mouth as needed.  90 tablet  1  . digoxin (LANOXIN) 0.125 MG tablet Take 1 tablet daily except none on Wednesday and Sunday or as directed  30 tablet  11  . docusate sodium (COLACE) 100 MG capsule Take 100 mg by mouth 2 (two) times daily. As needed       . donepezil (ARICEPT) 10 MG tablet Take 10 mg by mouth at bedtime as needed.        . meclizine  (ANTIVERT) 25 MG tablet Take 25 mg by mouth 3 (three) times daily as needed. Taking as needed      . metoprolol succinate (TOPROL-XL) 25 MG 24 hr tablet Take 12.5 mg by mouth daily.        . multivitamin (THERAGRAN) per tablet Take 1 tablet by mouth daily.        . Omega-3 Fatty Acids (FISH OIL PO) Take 400 mg by mouth daily. Taking 1400 daily      . rosuvastatin (CRESTOR) 10 MG tablet Take 0.5 tablets (5 mg total) by mouth daily. Or as directed by physician  90 tablet  3  . warfarin (COUMADIN) 5 MG tablet Take 1 tablet (5 mg total) by mouth daily. As directed by the [facility]  90 tablet  3    Allergies  Allergen Reactions  . Zocor (Simvastatin)   . Sulfa Antibiotics Rash    Patient Active Problem List  Diagnosis  . Atrial fibrillation  . Long term current use of anticoagulant  . Malaise and fatigue  . Right bundle branch block  . Hypercholesterolemia  . Dizziness - light-headed  . Inguinal hernia  . Weight loss,  unintentional  . Syncope and collapse    History  Smoking status  . Former Smoker  Smokeless tobacco  . Not on file    History  Alcohol Use No    Family History  Problem Relation Age of Onset  . Heart disease Mother     Review of Systems: Constitutional: no fever chills diaphoresis or fatigue or change in weight.  Head and neck: no hearing loss, no epistaxis, no photophobia or visual disturbance. Respiratory: No cough, shortness of breath or wheezing. Cardiovascular: No chest pain peripheral edema, palpitations. Gastrointestinal: No abdominal distention, no abdominal pain, no change in bowel habits hematochezia or melena. Genitourinary: No dysuria, no frequency, no urgency, no nocturia. Musculoskeletal:No arthralgias, no back pain, no gait disturbance or myalgias. Neurological: No dizziness, no headaches, no numbness, no seizures, no syncope, no weakness, no tremors. Hematologic: No lymphadenopathy, no easy bruising. Psychiatric: No confusion, no  hallucinations, no sleep disturbance.    Physical Exam: Filed Vitals:   07/01/12 1438  BP: 110/68  Pulse: 87   the general appearance reveals a well-developed well-nourished elderly woman in no distress.  She does have dementia and tends to repeat herself during the office visit.Pupils equal and reactive.   Extraocular Movements are full.  There is no scleral icterus.  The mouth and pharynx are normal.  The neck is supple.  The carotids reveal no bruits.  The jugular venous pressure is normal.  The thyroid is not enlarged.  There is no lymphadenopathy.  The chest is clear to percussion and auscultation. There are no rales or rhonchi. Expansion of the chest is symmetrical.  Heart reveals an irregular rhythm and no murmur gallop rub or click. The abdomen is soft and nontender. Bowel sounds are normal. The liver and spleen are not enlarged. There Are no abdominal masses. There are no bruits.  The pedal pulses are good.  There is no phlebitis or edema.  There is no cyanosis or clubbing. Strength is normal and symmetrical in all extremities.  There is no lateralizing weakness.  There are no sensory deficits.  The skin is warm and dry.  There is no rash.    Assessment / Plan:  The patient is to continue same medication.  I encouraged her to continue to get outdoor walking exercise.  Recheck in 4 months for office visit and EKG.

## 2012-07-09 ENCOUNTER — Ambulatory Visit: Payer: Self-pay | Admitting: Cardiology

## 2012-08-12 ENCOUNTER — Other Ambulatory Visit: Payer: Self-pay | Admitting: Cardiology

## 2012-09-03 ENCOUNTER — Encounter: Payer: Self-pay | Admitting: Cardiology

## 2012-10-22 ENCOUNTER — Other Ambulatory Visit: Payer: Self-pay

## 2012-10-22 DIAGNOSIS — F419 Anxiety disorder, unspecified: Secondary | ICD-10-CM

## 2012-10-22 MED ORDER — DIAZEPAM 5 MG PO TABS: 5.0000 mg | ORAL_TABLET | ORAL | Status: DC | PRN

## 2012-10-28 ENCOUNTER — Ambulatory Visit (INDEPENDENT_AMBULATORY_CARE_PROVIDER_SITE_OTHER): Payer: Medicare Other | Admitting: Cardiology

## 2012-10-28 VITALS — BP 110/62 | HR 72 | Resp 18 | Ht 65.0 in | Wt 121.8 lb

## 2012-10-28 DIAGNOSIS — R55 Syncope and collapse: Secondary | ICD-10-CM

## 2012-10-28 DIAGNOSIS — I4891 Unspecified atrial fibrillation: Secondary | ICD-10-CM

## 2012-10-28 NOTE — Patient Instructions (Signed)
Your physician recommends that you continue on your current medications as directed. Please refer to the Current Medication list given to you today. Your physician wants you to follow-up in: 4 months You will receive a reminder letter in the mail two months in advance. If you don't receive a letter, please call our office to schedule the follow-up appointment.  

## 2012-10-28 NOTE — Assessment & Plan Note (Signed)
Patient denies any further episodes of dizziness syncope or collapsed

## 2012-10-28 NOTE — Progress Notes (Signed)
Penny [name] Date of Birth:  [birth year] [facility] [address] [place], [place]  [address] [phone]         Fax   [phone]  History of Present Illness: This pleasant elderly woman is seen for a scheduled followup office visit. She has a history of chronic atrial fibrillation. She also has a past history of syncope and collapse which occurred at about the beginning of July 2013. She was found to have low blood pressure following that episode and her lisinopril was stopped. Since then the patient has had no recurrent dizziness or syncope her blood pressure has been stable. She denies any recent chest pain. She has not been experiencing much palpitations or sensation of racing of her heart. She has not had to take any sublingual nitroglycerin. Her appetite is good and her weight is down 1 pound since last visit. She is trying to get more exercise and has been doing some outdoor walking with her husband. She has difficulty sleeping at night and takes a Valium at bedtime frequently.  She denies any new cardiac symptoms since last visit   Current Outpatient Prescriptions  Medication Sig Dispense Refill  . Ascorbic Acid (VITAMIN C) 500 MG tablet Take 500 mg by mouth daily.        . calcium carbonate (OS-CAL) 600 MG TABS Take 600 mg by mouth 2 (two) times daily with a meal.        . Cholecalciferol (VITAMIN D PO) Take 2,000 Units by mouth daily.      . Cyanocobalamin (VITAMIN B 12 PO) Take by mouth daily.      . diazepam (VALIUM) 5 MG tablet Take 1 tablet (5 mg total) by mouth as needed.  90 tablet  1  . digoxin (LANOXIN) 0.125 MG tablet Take 1 tablet daily except none on Wednesday and Sunday or as directed  30 tablet  11  . docusate sodium (COLACE) 100 MG capsule Take 100 mg by mouth 2 (two) times daily. As needed       . donepezil (ARICEPT) 10 MG tablet Take 10 mg by mouth at bedtime as needed.        . meclizine (ANTIVERT) 25 MG tablet Take 25 mg by mouth 3 (three)  times daily as needed. Taking as needed      . metoprolol succinate (TOPROL-XL) 25 MG 24 hr tablet Take 12.5 mg by mouth daily.        . multivitamin (THERAGRAN) per tablet Take 1 tablet by mouth daily.        . Omega-3 Fatty Acids (FISH OIL PO) Take 400 mg by mouth daily. Taking 1400 daily      . rosuvastatin (CRESTOR) 10 MG tablet Take 0.5 tablets (5 mg total) by mouth daily. Or as directed by physician  90 tablet  3  . warfarin (COUMADIN) 5 MG tablet Take 1 tablet (5 mg total) by mouth daily. As directed by the [facility]  90 tablet  3  . CRESTOR 10 MG tablet TAKE ONE-HALF TABLET BY MOUTH EVERY DAY OR AS DIRECTED BY PHYSICAN  90 each  1  . metoprolol tartrate (LOPRESSOR) 25 MG tablet TAKE ONE-HALF TABLET BY MOUTH EVERY DAY  90 tablet  2    Allergies  Allergen Reactions  . Zocor (Simvastatin)   . Sulfa Antibiotics Rash    Patient Active Problem List  Diagnosis  . Atrial fibrillation  . Long term current use of anticoagulant  . Malaise and fatigue  .  Right bundle branch block  . Hypercholesterolemia  . Dizziness - light-headed  . Inguinal hernia  . Weight loss, unintentional  . Syncope and collapse    History  Smoking status  . Former Smoker  Smokeless tobacco  . Not on file    History  Alcohol Use No    Family History  Problem Relation Age of Onset  . Heart disease Mother     Review of Systems: Constitutional: no fever chills diaphoresis or fatigue or change in weight.  Head and neck: no hearing loss, no epistaxis, no photophobia or visual disturbance. Respiratory: No cough, shortness of breath or wheezing. Cardiovascular: No chest pain peripheral edema, palpitations. Gastrointestinal: No abdominal distention, no abdominal pain, no change in bowel habits hematochezia or melena. Genitourinary: No dysuria, no frequency, no urgency, no nocturia. Musculoskeletal:No arthralgias, no back pain, no gait disturbance or myalgias. Neurological: No dizziness, no  headaches, no numbness, no seizures, no syncope, no weakness, no tremors. Hematologic: No lymphadenopathy, no easy bruising. Psychiatric: No confusion, no hallucinations, no sleep disturbance.    Physical Exam: Filed Vitals:   10/28/12 1417  BP: 110/62  Pulse: 72  Resp: 18   general appearance reveals a pleasant woman in no acute distress.  She does have some early dementia.The head and neck exam reveals pupils equal and reactive.  Extraocular movements are full.  There is no scleral icterus.  The mouth and pharynx are normal.  The neck is supple.  The carotids reveal no bruits.  The jugular venous pressure is normal.  The  thyroid is not enlarged.  There is no lymphadenopathy.  The chest is clear to percussion and auscultation.  There are no rales or rhonchi.  Expansion of the chest is symmetrical.  The rhythm is irregular in atrial fibrillation The precordium is quiet.  The first heart sound is normal.  The second heart sound is physiologically split.  There is no murmur gallop rub or click.  There is no abnormal lift or heave.  The abdomen is soft and nontender.  The bowel sounds are normal.  The liver and spleen are not enlarged.  There are no abdominal masses.  Does have a soft right inguinal hernia which is easily reducible and is nontender.  There are no abdominal bruits.  Extremities reveal good pedal pulses.  There is no phlebitis or edema.  There is no cyanosis or clubbing.  Strength is normal and symmetrical in all extremities.  There is no lateralizing weakness.  There are no sensory deficits.  The skin is warm and dry.  There is no rash.     Assessment / Plan: Continue on same medication.  Recheck in 4 months.

## 2012-10-28 NOTE — Assessment & Plan Note (Signed)
Energy level has improved since last visit

## 2012-10-28 NOTE — Assessment & Plan Note (Signed)
Patient has not had any awareness of increased palpitations.  No TIA symptoms.

## 2013-01-12 ENCOUNTER — Emergency Department (HOSPITAL_COMMUNITY): Payer: Medicare Other

## 2013-01-12 ENCOUNTER — Inpatient Hospital Stay (HOSPITAL_COMMUNITY)
Admission: EM | Admit: 2013-01-12 | Discharge: 2013-01-14 | DRG: 312 | Disposition: A | Discharge status: Home / Self Care | Payer: Medicare Other | Attending: Internal Medicine | Admitting: Internal Medicine

## 2013-01-12 ENCOUNTER — Encounter (HOSPITAL_COMMUNITY): Payer: Self-pay | Admitting: Emergency Medicine

## 2013-01-12 DIAGNOSIS — Z79899 Other long term (current) drug therapy: Secondary | ICD-10-CM

## 2013-01-12 DIAGNOSIS — Z96659 Presence of unspecified artificial knee joint: Secondary | ICD-10-CM

## 2013-01-12 DIAGNOSIS — Z96619 Presence of unspecified artificial shoulder joint: Secondary | ICD-10-CM

## 2013-01-12 DIAGNOSIS — N390 Urinary tract infection, site not specified: Secondary | ICD-10-CM | POA: Diagnosis present

## 2013-01-12 DIAGNOSIS — I498 Other specified cardiac arrhythmias: Secondary | ICD-10-CM | POA: Diagnosis present

## 2013-01-12 DIAGNOSIS — Z7901 Long term (current) use of anticoagulants: Secondary | ICD-10-CM

## 2013-01-12 DIAGNOSIS — E785 Hyperlipidemia, unspecified: Secondary | ICD-10-CM | POA: Diagnosis present

## 2013-01-12 DIAGNOSIS — F411 Generalized anxiety disorder: Secondary | ICD-10-CM | POA: Diagnosis present

## 2013-01-12 DIAGNOSIS — F329 Major depressive disorder, single episode, unspecified: Secondary | ICD-10-CM | POA: Diagnosis present

## 2013-01-12 DIAGNOSIS — I10 Essential (primary) hypertension: Secondary | ICD-10-CM | POA: Diagnosis present

## 2013-01-12 DIAGNOSIS — R42 Dizziness and giddiness: Secondary | ICD-10-CM

## 2013-01-12 DIAGNOSIS — I4891 Unspecified atrial fibrillation: Secondary | ICD-10-CM | POA: Diagnosis present

## 2013-01-12 DIAGNOSIS — B9620 Unspecified Escherichia coli [E. coli] as the cause of diseases classified elsewhere: Secondary | ICD-10-CM | POA: Diagnosis present

## 2013-01-12 DIAGNOSIS — M810 Age-related osteoporosis without current pathological fracture: Secondary | ICD-10-CM | POA: Diagnosis present

## 2013-01-12 DIAGNOSIS — F3289 Other specified depressive episodes: Secondary | ICD-10-CM | POA: Diagnosis present

## 2013-01-12 DIAGNOSIS — Z8249 Family history of ischemic heart disease and other diseases of the circulatory system: Secondary | ICD-10-CM

## 2013-01-12 DIAGNOSIS — Z87891 Personal history of nicotine dependence: Secondary | ICD-10-CM

## 2013-01-12 DIAGNOSIS — F0390 Unspecified dementia without behavioral disturbance: Secondary | ICD-10-CM | POA: Diagnosis present

## 2013-01-12 DIAGNOSIS — R001 Bradycardia, unspecified: Secondary | ICD-10-CM | POA: Diagnosis present

## 2013-01-12 DIAGNOSIS — Z882 Allergy status to sulfonamides status: Secondary | ICD-10-CM

## 2013-01-12 DIAGNOSIS — R55 Syncope and collapse: Principal | ICD-10-CM | POA: Diagnosis present

## 2013-01-12 LAB — CBC WITH DIFFERENTIAL/PLATELET
HCT: 38.3 % (ref 36.0–46.0)
Hemoglobin: 13.3 g/dL (ref 12.0–15.0)
Lymphocytes Relative: 9 % — ABNORMAL LOW (ref 12–46)
Monocytes Absolute: 0.6 10*3/uL (ref 0.1–1.0)
Monocytes Relative: 6 % (ref 3–12)
Neutro Abs: 9 10*3/uL — ABNORMAL HIGH (ref 1.7–7.7)
Neutrophils Relative %: 84 % — ABNORMAL HIGH (ref 43–77)
RBC: 4.42 MIL/uL (ref 3.87–5.11)
WBC: 10.7 10*3/uL — ABNORMAL HIGH (ref 4.0–10.5)

## 2013-01-12 LAB — URINALYSIS, ROUTINE W REFLEX MICROSCOPIC
Bilirubin Urine: NEGATIVE
Ketones, ur: NEGATIVE mg/dL
Nitrite: NEGATIVE
Specific Gravity, Urine: 1.014 (ref 1.005–1.030)
pH: 7 (ref 5.0–8.0)

## 2013-01-12 LAB — URINE MICROSCOPIC-ADD ON

## 2013-01-12 LAB — LACTIC ACID, PLASMA: Lactic Acid, Venous: 2.6 mmol/L — ABNORMAL HIGH (ref 0.5–2.2)

## 2013-01-12 LAB — APTT: aPTT: 39 seconds — ABNORMAL HIGH (ref 24–37)

## 2013-01-12 LAB — PROTIME-INR: INR: 1.75 — ABNORMAL HIGH (ref 0.00–1.49)

## 2013-01-12 NOTE — ED Notes (Addendum)
Patient at [facility], had a brief syncopal episode, then had nausea and multiple vomiting episodes.  Patient still complaining of nausea, diaphoretic with EMS.  VS stable enroute to ED.  Patient is having some abdominal pains at this time.  Zofran given by EMS.

## 2013-01-12 NOTE — ED Notes (Addendum)
Patient is laying in bed under a bear hugger warmer due to her low temperature.  Family is present with patient.  Patient's family stated the reason for her visit was due to her "passing out" right after eating supper.  According to family patient has a history of falling and fainting spells.  Pt stated this past Saturday she fell in the bathroom and hit the right side of her head. No bruising is noted.  Patient is alert and oriented and able to answer questions.

## 2013-01-13 ENCOUNTER — Emergency Department (HOSPITAL_COMMUNITY): Payer: Medicare Other

## 2013-01-13 ENCOUNTER — Encounter (HOSPITAL_COMMUNITY): Payer: Self-pay | Admitting: Emergency Medicine

## 2013-01-13 DIAGNOSIS — N390 Urinary tract infection, site not specified: Secondary | ICD-10-CM | POA: Diagnosis present

## 2013-01-13 DIAGNOSIS — I059 Rheumatic mitral valve disease, unspecified: Secondary | ICD-10-CM

## 2013-01-13 DIAGNOSIS — R42 Dizziness and giddiness: Secondary | ICD-10-CM

## 2013-01-13 DIAGNOSIS — R55 Syncope and collapse: Principal | ICD-10-CM

## 2013-01-13 DIAGNOSIS — I4891 Unspecified atrial fibrillation: Secondary | ICD-10-CM

## 2013-01-13 LAB — COMPREHENSIVE METABOLIC PANEL
AST: 23 U/L (ref 0–37)
Albumin: 3.1 g/dL — ABNORMAL LOW (ref 3.5–5.2)
Alkaline Phosphatase: 76 U/L (ref 39–117)
BUN: 13 mg/dL (ref 6–23)
CO2: 25 mEq/L (ref 19–32)
Chloride: 105 mEq/L (ref 96–112)
Creatinine, Ser: 0.76 mg/dL (ref 0.50–1.10)
GFR calc non Af Amer: 77 mL/min — ABNORMAL LOW (ref >90)
Potassium: 3.9 mEq/L (ref 3.5–5.1)
Total Bilirubin: 0.5 mg/dL (ref 0.3–1.2)

## 2013-01-13 LAB — TROPONIN I: Troponin I: <0.3 ng/mL (ref <0.30)

## 2013-01-13 LAB — GRAM STAIN

## 2013-01-13 MED ORDER — WARFARIN SODIUM 2.5 MG PO TABS
2.5000 mg | ORAL_TABLET | Freq: Every day | ORAL | Status: DC
  Filled 2013-01-13: qty 1

## 2013-01-13 MED ORDER — DEXTROSE 5 % IV SOLN
1.0000 g | Freq: Once | INTRAVENOUS | Status: AC
  Administered 2013-01-13: 1 g via INTRAVENOUS
  Filled 2013-01-13: qty 10

## 2013-01-13 MED ORDER — ASPIRIN 81 MG PO CHEW
CHEWABLE_TABLET | ORAL | Status: AC
  Filled 2013-01-13: qty 1

## 2013-01-13 MED ORDER — ACETAMINOPHEN 650 MG RE SUPP: 650.0000 mg | Freq: Four times a day (QID) | RECTAL | Status: DC | PRN

## 2013-01-13 MED ORDER — ACETAMINOPHEN 325 MG PO TABS: 650.0000 mg | ORAL_TABLET | Freq: Four times a day (QID) | ORAL | Status: DC | PRN

## 2013-01-13 MED ORDER — ATORVASTATIN CALCIUM 20 MG PO TABS
20.0000 mg | ORAL_TABLET | Freq: Every day | ORAL | Status: DC
  Administered 2013-01-13: 20 mg via ORAL
  Filled 2013-01-13 (×3): qty 1

## 2013-01-13 MED ORDER — SODIUM CHLORIDE 0.9 % IJ SOLN
3.0000 mL | Freq: Two times a day (BID) | INTRAMUSCULAR | Status: DC
  Administered 2013-01-13 – 2013-01-14 (×2): 3 mL via INTRAVENOUS

## 2013-01-13 MED ORDER — WARFARIN - PHARMACIST DOSING INPATIENT
Freq: Every day | Status: DC
  Administered 2013-01-13: 18:00:00

## 2013-01-13 MED ORDER — SODIUM CHLORIDE 0.9 % IV SOLN: INTRAVENOUS | Status: DC

## 2013-01-13 MED ORDER — ONDANSETRON HCL 4 MG PO TABS
4.0000 mg | ORAL_TABLET | Freq: Once | ORAL | Status: AC
  Administered 2013-01-13: 4 mg via ORAL
  Filled 2013-01-13: qty 1

## 2013-01-13 MED ORDER — ZOLPIDEM TARTRATE 5 MG PO TABS: 5.0000 mg | ORAL_TABLET | Freq: Every evening | ORAL | Status: DC | PRN

## 2013-01-13 MED ORDER — DIGOXIN 125 MCG PO TABS
0.1250 mg | ORAL_TABLET | ORAL | Status: DC
  Administered 2013-01-14: 0.125 mg via ORAL
  Filled 2013-01-13: qty 1

## 2013-01-13 MED ORDER — DONEPEZIL HCL 10 MG PO TABS
10.0000 mg | ORAL_TABLET | Freq: Every day | ORAL | Status: DC
  Administered 2013-01-13: 10 mg via ORAL
  Filled 2013-01-13 (×3): qty 1

## 2013-01-13 MED ORDER — PROMETHAZINE HCL 25 MG PO TABS: 12.5000 mg | ORAL_TABLET | Freq: Four times a day (QID) | ORAL | Status: DC | PRN

## 2013-01-13 MED ORDER — WARFARIN SODIUM 5 MG PO TABS
5.0000 mg | ORAL_TABLET | Freq: Once | ORAL | Status: AC
  Administered 2013-01-13: 5 mg via ORAL
  Filled 2013-01-13: qty 1

## 2013-01-13 MED ORDER — MULTIVITAMINS PO TABS: 1.0000 | ORAL_TABLET | Freq: Every day | ORAL | Status: DC

## 2013-01-13 MED ORDER — METOPROLOL TARTRATE 12.5 MG HALF TABLET
12.5000 mg | ORAL_TABLET | Freq: Every morning | ORAL | Status: DC
  Administered 2013-01-13 – 2013-01-14 (×2): 12.5 mg via ORAL
  Filled 2013-01-13 (×3): qty 1

## 2013-01-13 MED ORDER — BISACODYL 5 MG PO TBEC: 5.0000 mg | DELAYED_RELEASE_TABLET | Freq: Every day | ORAL | Status: DC | PRN

## 2013-01-13 MED ORDER — DOCUSATE SODIUM 100 MG PO CAPS: 100.0000 mg | ORAL_CAPSULE | Freq: Two times a day (BID) | ORAL | Status: DC | PRN

## 2013-01-13 MED ORDER — WARFARIN - PHYSICIAN DOSING INPATIENT: Freq: Every day | Status: DC

## 2013-01-13 MED ORDER — DEXTROSE 5 % IV SOLN
1.0000 g | INTRAVENOUS | Status: DC
  Administered 2013-01-13: 1 g via INTRAVENOUS
  Filled 2013-01-13 (×2): qty 10

## 2013-01-13 MED ORDER — ALUM & MAG HYDROXIDE-SIMETH 200-200-20 MG/5ML PO SUSP: 30.0000 mL | Freq: Four times a day (QID) | ORAL | Status: DC | PRN

## 2013-01-13 MED ORDER — DIAZEPAM 2 MG PO TABS
2.0000 mg | ORAL_TABLET | Freq: Once | ORAL | Status: AC
  Administered 2013-01-13: 2 mg via ORAL
  Filled 2013-01-13: qty 1

## 2013-01-13 MED ORDER — DIGOXIN 125 MCG PO TABS
0.1250 mg | ORAL_TABLET | ORAL | Status: DC
  Administered 2013-01-13: 0.125 mg via ORAL
  Filled 2013-01-13 (×2): qty 1

## 2013-01-13 MED ORDER — ASPIRIN EC 81 MG PO TBEC
81.0000 mg | DELAYED_RELEASE_TABLET | Freq: Every day | ORAL | Status: DC
  Administered 2013-01-13 – 2013-01-14 (×2): 81 mg via ORAL
  Filled 2013-01-13 (×3): qty 1

## 2013-01-13 MED ORDER — ADULT MULTIVITAMIN W/MINERALS CH
1.0000 | ORAL_TABLET | Freq: Every day | ORAL | Status: DC
  Administered 2013-01-13 – 2013-01-14 (×2): 1 via ORAL
  Filled 2013-01-13 (×2): qty 1

## 2013-01-13 MED ORDER — HYDROCODONE-ACETAMINOPHEN 5-325 MG PO TABS: 1.0000 | ORAL_TABLET | ORAL | Status: DC | PRN

## 2013-01-13 MED ORDER — SODIUM CHLORIDE 0.9 % IV SOLN
INTRAVENOUS | Status: DC
  Administered 2013-01-13 (×2): via INTRAVENOUS

## 2013-01-13 MED ORDER — DIAZEPAM 5 MG PO TABS
2.5000 mg | ORAL_TABLET | Freq: Two times a day (BID) | ORAL | Status: DC | PRN
  Administered 2013-01-13: 2.5 mg via ORAL
  Filled 2013-01-13: qty 1

## 2013-01-13 NOTE — Progress Notes (Signed)
Pt admitted to 4702 from ER, denies any pain or discomfort at this time, A fib on monitor, no distress noticed. Pt oriented to the unit and to her room and encouraged to call for assistance to get OOB to prevent any fall or injury while in the hospital. Daughter at the bedside.

## 2013-01-13 NOTE — Progress Notes (Signed)
Utilization Review Completed.   Adael Culbreath, RN, BSN Nurse Case Manager  [phone]  

## 2013-01-13 NOTE — Progress Notes (Signed)
VASCULAR LAB PRELIMINARY  PRELIMINARY  PRELIMINARY  PRELIMINARY  Carotid duplex has been completed.    Preliminary report: no significant ICA stenosis or plaque noted bilaterally.   Vertebral artery flow is antegrade.  Kristena Wilhelmi, RVT 01/13/2013, 3:22 PM

## 2013-01-13 NOTE — ED Provider Notes (Signed)
  I performed a history and physical examination of Penny [name] and discussed her management with Dr. Piedad Climes.  I agree with the history, physical, assessment, and plan of care, with the following exceptions: None  I saw the ECG, relevant labs and studies - I agree with the interpretation.   Elyse Jarvis, MD 01/13/13 [phone]

## 2013-01-13 NOTE — H&P (Signed)
PCP:   Kari Baars, MD   Chief Complaint:  Passed out  HPI: Penny [name] is an 79 year old white female with a history of atrial fibrillation on anticoagulation, mild dementia, and hypertension who presented to the emergency department with an episode of loss of consciousness. Patient states that she's been dealing with an upper respiratory infection for the past 3-4 days which was getting better. She was not taking any medications for this.  She went to dinner with her husband at [employer]. They were getting ready to leave after eating and she stated she felt dizzy. She continued to sit and then slumped over. Her husband states that she was unresponsive for a short period of time before returning to normal level of consciousness. There was no loss of bladder or bowel continence. No apparent seizure activity. She denies any chest pain, palpitations, nausea, or vomiting with this episode. She does have permanent atrial fibrillation and is on AV nodal agents for rate control but no other history of dysrhythmia or syncope. Her last echocardiogram in 8/12 showed an ejection fraction of 45-50%.  She is followed by Dr. Patty Sermons is her cardiologist.  Given the loss of consciousness, EMS was called she was brought to the emergency department where head CT was negative. EKG shows atrial fibrillation with intermittent pause.  Cardiac enzymes negative. Urinalysis significant for probable urinary tract infection.  Review of Systems:  Review of Systems - all systems reviewed with the patient are negative except in history of present illness. She denies any fever chills or sweats. No recent nausea, vomiting, diarrhea.  Her daughter feels that her memory is getting worse despite Aricept. She reports generalized fatigue.    Past Medical History: Atrial fibrillation, on anticoagulation.  Hypertension Hyperlipidemia Anxiety Disorder/Depression Mild Dementia Osteoporosis with history of right shoulder  fracture status post right hemiarthroplasty (2008) History of shingles History of left kidney mass with a negative biopsy in 2008 History of nephrolithiasis History of right olecranon pressure ulcer   Past Surgical History  Status post right total knee replacement.  Status post hysterectomy.  Status post bladder tack.  History of lithotripsy (2008).  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    Medications: Prior to Admission medications   Medication Sig Start Date End Date Taking? Authorizing Provider  Ascorbic Acid (VITAMIN C) 500 MG tablet Take 500 mg by mouth daily.     Yes Historical Provider, MD  cholecalciferol (VITAMIN D) 1000 UNITS tablet Take 2,000 Units by mouth daily.   Yes Historical Provider, MD  Cyanocobalamin (VITAMIN B 12 PO) Take 1 tablet by mouth daily.    Yes Historical Provider, MD  diazepam (VALIUM) 5 MG tablet Take 2.5 mg by mouth every 12 (twelve) hours as needed for anxiety or sleep.   Yes Historical Provider, MD  digoxin (LANOXIN) 0.125 MG tablet Take 0.125 mg by mouth See admin instructions. Every day except none on Sunday and Wednesday   Yes Historical Provider, MD  docusate sodium (COLACE) 100 MG capsule Take 100 mg by mouth 2 (two) times daily as needed for constipation.    Yes Historical Provider, MD  donepezil (ARICEPT) 10 MG tablet Take 10 mg by mouth at bedtime.    Yes Historical Provider, MD  meclizine (ANTIVERT) 25 MG tablet Take 25 mg by mouth 3 (three) times daily as needed for dizziness.    Yes Historical Provider, MD  metoprolol tartrate (LOPRESSOR) 25 MG tablet Take 12.5 mg by mouth every  morning.   Yes Historical Provider, MD  multivitamin [facility]) per tablet Take 1 tablet by mouth daily.     Yes Historical Provider, MD  Omega-3 Fatty Acids (FISH OIL PO) Take 1,400 mg by mouth daily.    Yes Historical Provider, MD  rosuvastatin (CRESTOR) 10 MG tablet Take 5 mg by mouth daily. 06/05/12  Yes Cassell Clement, MD  warfarin (COUMADIN) 2.5 MG tablet Take 2.5 mg by mouth daily.   Yes Historical Provider, MD    Allergies:   Allergies  Allergen Reactions  . Zocor (Simvastatin)   . Sulfa Antibiotics Rash    Social History: She is married. She has three children and five  grandchildren. 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 Mother died of  heart disease in her 59s.   Physical Exam: Filed Vitals:   01/13/13 0030 01/13/13 0200 01/13/13 0221 01/13/13 0313  BP: 106/52 98/64  113/71  Pulse: 82 86  81  Temp:   97.8 F (36.6 C) 97.5 F (36.4 C)  TempSrc:    Oral  Resp: 19 19  20   Height:    5\' 6"  (1.676 m)  Weight:    54.341 kg (119 lb 12.8 oz)  SpO2: 94% 96%  99%   General appearance: alert and no distress Head: Normocephalic, without obvious abnormality, atraumatic Eyes: conjunctivae/corneas clear. PERRL, EOM's intact.  Nose: Nares normal. Septum midline. Mucosa normal. No drainage or sinus tenderness. Throat: lips, mucosa, and tongue normal; teeth and gums normal Neck: no adenopathy, no carotid bruit, no JVD and thyroid not enlarged, symmetric, no tenderness/mass/nodules Resp: clear to auscultation bilaterally Cardio: irregularly irregular rhythm GI: soft, non-tender; bowel sounds normal; no masses,  no organomegaly Extremities: extremities normal, atraumatic, no cyanosis or edema; decreased range of motion right shoulder is chronic Pulses: 2+ and symmetric Lymph nodes: Cervical adenopathy: no cervical lymphadenopathy Neurologic: Alert and oriented X 3, normal strength and tone. .     Labs on Admission:   Recent Labs  01/12/13 [id number]  NA 142  K 3.9  CL 105  CO2 25  GLUCOSE 111*  BUN 13  CREATININE 0.76  CALCIUM 9.8    Recent Labs  01/12/13 Apr 13, 2302  AST 23  ALT 7  ALKPHOS 76  BILITOT 0.5  PROT 6.6  ALBUMIN 3.1*   No results found for this basename:  LIPASE, AMYLASE,  in the last 72 hours  Recent Labs  01/12/13 04/13/02  WBC 10.7*  NEUTROABS 9.0*  HGB 13.3  HCT 38.3  MCV 86.7  PLT 183    Recent Labs  01/12/13 2304 01/13/13 0140  TROPONINI <0.30 <0.30   Lab Results  Component Value Date   INR 1.75* 01/12/2013   INR 2.19* 07/11/2011   INR 1.79* 07/10/2011   Digoxin Level 0.7 Urinalysis- TNTC WBCs, 3-6 RBCs; Negative Nitrites  Radiological Exams on Admission: Dg Chest 1 View  01/12/2013  *RADIOLOGY REPORT*  Clinical Data: Syncope and hypothermia.  CHEST - 1 VIEW  Comparison: 07/10/2011 chest radiograph  Findings: Cardiomegaly identified. There is no evidence of focal airspace disease, pulmonary edema, suspicious pulmonary nodule/mass, pleural effusion, or pneumothorax. No acute bony abnormalities are identified. Right shoulder hemiarthroplasty again noted.  IMPRESSION: Cardiomegaly without evidence of acute cardiopulmonary disease.   Original Report Authenticated By: Harmon Pier, M.D.    Ct Head Wo Contrast  01/13/2013  *RADIOLOGY REPORT*  Clinical Data: Breech syncopal episode followed  by nausea and vomiting.  Fall.  CT HEAD WITHOUT CONTRAST  Technique:  Contiguous axial images were obtained from the base of the skull through the vertex without contrast.  Comparison: 11/20/2007  Findings: Diffuse cerebral atrophy.  Mild ventricular dilatation consistent with central atrophy.  Patchy low attenuation changes in the deep white matter consistent with small vessel ischemic change. No mass effect or midline shift.  No abnormal extra-axial fluid collections.  Gray-white matter junctions are distinct.  Basal cisterns are not effaced.  No evidence of acute intracranial hemorrhage.  Vascular calcifications.  No depressed skull fractures.  Small air-fluid levels in the maxillary antra bilaterally.  Mastoid air cells are not opacified.  IMPRESSION: No acute intracranial abnormalities.  Air-fluid levels demonstrated in the maxillary antra.   Original  Report Authenticated By: Burman Nieves, M.D.    EKG- personally reviewed- Afib, rate; 2 pauses noted Telemetry- Afib; no events  Assessment/Plan Principal Problem: 1. Syncope and collapse- we'll admit for 24-hour observation with telemetry to evaluate for dysrhythmia. Obtain orthostatics though she has already received some fluids. Her blood pressure was slightly low on admission and may be playing a role. She did have 2 pauses on her EKG which may signify some underlying risk of bradycardia/SSS.  Will discuss with cardiology. Will obtain a echocardiogram and carotid Dopplers. No additional neurologic evaluation warranted at this time.  We'll continue with gentle hydration   Active Problems: 2. Atrial fibrillation- continue metoprolol and digoxin for rate control. Coumadin per pharmacy. INR was slightly subtherapeutic and is managed by [facility] as outpatient. 3. UTI (urinary tract infection)- continue Rocephin for now. Will transition to oral Cipro prior to discharge 4. Dementia- continue Aricept. 5. Disposition- anticipate discharge tomorrow if stable with negative evaluation.   Labresha Mellor,W DOUGLAS 01/13/2013, 7:14 AM

## 2013-01-13 NOTE — ED Provider Notes (Signed)
History     CSN: [id number]  Arrival date & time 01/12/13  2052   First MD Initiated Contact with Patient 01/12/13 2105      Chief Complaint  Patient presents with  . Loss of Consciousness  . Nausea    HPI Penny [name] is a 79 y.o. female who presented to the ED for concern of a syncopal episode and AMS.  Patient with history of undiagnosed dementia and has been slowly deteriorating over last few years.  Today, patient eating at [employer] with husband and after eating, patient complained of "[place] feeling," and then didn't look well over next 5-10 minutes.  Then passed out.  Husband reports that she slumped over in her chair and went unresponsive.  She never actually fell to ground.  Then slowly responded over 5 minutes before coming to baseline.  EMS called and patient at baseline when they arrived.  No other symptoms prior.  No fevers.  No cough.  No changes in urination.  No other symptoms.  Past Medical History  Diagnosis Date  . Long-term (current) use of anticoagulants   . Chronic shoulder pain   . Heart palpitations     rare  . Memory problem   . Depression   . Atrial fibrillation   . Hyperlipidemia   . Dyslipidemia     Past Surgical History  Procedure Laterality Date  . Total knee arthroplasty      right  . Partial hysterectomy    . Ureteral stent placement      bilateral / for stenosis    Family History  Problem Relation Age of Onset  . Heart disease Mother     History  Substance Use Topics  . Smoking status: Former Games developer  . Smokeless tobacco: Not on file  . Alcohol Use: No    OB History   Grav Para Term Preterm Abortions TAB SAB Ect Mult Living                  Review of Systems  Constitutional: Negative for fever and chills.  HENT: Negative for congestion, rhinorrhea, neck pain and neck stiffness.   Respiratory: Negative for cough and shortness of breath.   Cardiovascular: Negative for chest pain.  Gastrointestinal: Negative for  nausea, vomiting, abdominal pain, diarrhea and abdominal distention.  Endocrine: Negative for polyuria.  Genitourinary: Negative for dysuria.  Skin: Negative for rash.  Neurological: Positive for syncope. Negative for tremors, seizures, speech difficulty, weakness, light-headedness, numbness and headaches.  Psychiatric/Behavioral: Negative.   All other systems reviewed and are negative.    Allergies  Zocor and Sulfa antibiotics  Home Medications   Current Outpatient Rx  Name  Route  Sig  Dispense  Refill  . Ascorbic Acid (VITAMIN C) 500 MG tablet   Oral   Take 500 mg by mouth daily.           . cholecalciferol (VITAMIN D) 1000 UNITS tablet   Oral   Take 2,000 Units by mouth daily.         . Cyanocobalamin (VITAMIN B 12 PO)   Oral   Take 1 tablet by mouth daily.          . diazepam (VALIUM) 5 MG tablet   Oral   Take 2.5 mg by mouth every 12 (twelve) hours as needed for anxiety or sleep.         [name] digoxin (LANOXIN) 0.125 MG tablet   Oral   Take 0.125 mg by mouth See  admin instructions. Every day except none on Sunday and Wednesday         . docusate sodium (COLACE) 100 MG capsule   Oral   Take 100 mg by mouth 2 (two) times daily as needed for constipation.          [name] donepezil (ARICEPT) 10 MG tablet   Oral   Take 10 mg by mouth at bedtime.          . meclizine (ANTIVERT) 25 MG tablet   Oral   Take 25 mg by mouth 3 (three) times daily as needed for dizziness.          . metoprolol tartrate (LOPRESSOR) 25 MG tablet   Oral   Take 12.5 mg by mouth every morning.         . multivitamin (THERAGRAN) per tablet   Oral   Take 1 tablet by mouth daily.           . Omega-3 Fatty Acids (FISH OIL PO)   Oral   Take 1,400 mg by mouth daily.          . rosuvastatin (CRESTOR) 10 MG tablet   Oral   Take 5 mg by mouth daily.         [name] warfarin (COUMADIN) 2.5 MG tablet   Oral   Take 2.5 mg by mouth daily.           BP 106/52  Pulse 82   Temp(Src) 98 F (36.7 C) (Oral)  Resp 19  SpO2 94%  Physical Exam  Nursing note and vitals reviewed. Constitutional: She is oriented to person, place, and time. She appears well-developed and well-nourished. No distress.  HENT:  Head: Normocephalic and atraumatic.  Right Ear: External ear normal.  Left Ear: External ear normal.  Nose: Nose normal.  Mouth/Throat: Oropharynx is clear and moist. No oropharyngeal exudate.  Eyes: EOM are normal. Pupils are equal, round, and reactive to light.  Neck: Normal range of motion. Neck supple. No tracheal deviation present.  Cardiovascular: Normal rate.   Pulmonary/Chest: Effort normal and breath sounds normal. No stridor. No respiratory distress. She has no wheezes. She has no rales.  Abdominal: Soft. She exhibits no distension. There is no tenderness. There is no rebound.  Musculoskeletal: Normal range of motion.  Neurological: She is alert and oriented to person, place, and time. She has normal strength and normal reflexes. No cranial nerve deficit or sensory deficit. She displays a negative Romberg sign. Coordination and gait normal. GCS eye subscore is 4. GCS verbal subscore is 4. GCS motor subscore is 6.  Skin: Skin is warm and dry. She is not diaphoretic.    ED Course  Procedures (including critical care time)  Labs Reviewed  CBC WITH DIFFERENTIAL - Abnormal; Notable for the following:    WBC 10.7 (*)    Neutrophils Relative 84 (*)    Neutro Abs 9.0 (*)    Lymphocytes Relative 9 (*)    All other components within normal limits  COMPREHENSIVE METABOLIC PANEL - Abnormal; Notable for the following:    Glucose, Bld 111 (*)    Albumin 3.1 (*)    GFR calc non Af Amer 77 (*)    GFR calc Af Amer 89 (*)    All other components within normal limits  LACTIC ACID, PLASMA - Abnormal; Notable for the following:    Lactic Acid, Venous 2.6 (*)    All other components within normal limits  URINALYSIS, ROUTINE W REFLEX MICROSCOPIC -  Abnormal;  Notable for the following:    APPearance TURBID (*)    Hgb urine dipstick SMALL (*)    Protein, ur 100 (*)    Leukocytes, UA LARGE (*)    All other components within normal limits  PROTIME-INR - Abnormal; Notable for the following:    Prothrombin Time 19.8 (*)    INR 1.75 (*)    All other components within normal limits  APTT - Abnormal; Notable for the following:    aPTT 39 (*)    All other components within normal limits  URINE MICROSCOPIC-ADD ON - Abnormal; Notable for the following:    Squamous Epithelial / LPF FEW (*)    Bacteria, UA MANY (*)    All other components within normal limits  GRAM STAIN  URINE CULTURE  CULTURE, BLOOD (ROUTINE X 2)  CULTURE, BLOOD (ROUTINE X 2)  TROPONIN I   Dg Chest 1 View  01/12/2013  *RADIOLOGY REPORT*  Clinical Data: Syncope and hypothermia.  CHEST - 1 VIEW  Comparison: 07/10/2011 chest radiograph  Findings: Cardiomegaly identified. There is no evidence of focal airspace disease, pulmonary edema, suspicious pulmonary nodule/mass, pleural effusion, or pneumothorax. No acute bony abnormalities are identified. Right shoulder hemiarthroplasty again noted.  IMPRESSION: Cardiomegaly without evidence of acute cardiopulmonary disease.   Original Report Authenticated By: Harmon Pier, M.D.    Ct Head Wo Contrast  01/13/2013  *RADIOLOGY REPORT*  Clinical Data: Breech syncopal episode followed by nausea and vomiting.  Fall.  CT HEAD WITHOUT CONTRAST  Technique:  Contiguous axial images were obtained from the base of the skull through the vertex without contrast.  Comparison: 11/20/2007  Findings: Diffuse cerebral atrophy.  Mild ventricular dilatation consistent with central atrophy.  Patchy low attenuation changes in the deep white matter consistent with small vessel ischemic change. No mass effect or midline shift.  No abnormal extra-axial fluid collections.  Gray-white matter junctions are distinct.  Basal cisterns are not effaced.  No evidence of acute  intracranial hemorrhage.  Vascular calcifications.  No depressed skull fractures.  Small air-fluid levels in the maxillary antra bilaterally.  Mastoid air cells are not opacified.  IMPRESSION: No acute intracranial abnormalities.  Air-fluid levels demonstrated in the maxillary antra.   Original Report Authenticated By: Burman Nieves, M.D.     Date: 01/13/2013  Rate: 67  Rhythm: atrial fibrillation  QRS Axis: normal  Intervals: normal  ST/T Wave abnormalities: normal  Conduction Disutrbances:right bundle branch block  Narrative Interpretation: atrial fibrillation, RBBB  Old EKG Reviewed: unchanged    1. Syncope   2. UTI (lower urinary tract infection)       MDM  Penny [name] is a 79 y.o. female with history of atrial fibrillation on coumadin and likely dementia who presented to the ED after syncopal episode.  Patient with normal neuro exam.  GCS 14 (disoriented).  Patient initially hypothermic with temp of 35.8 rectally.  Rewarming initiated and workup performed.  Workup positive for UTI.  Rocephin given.  Patient with history of kidney stones and ureteral stents, but no stents in currently and no evidence of stone on exam.  Patient with history of recent mechanical fall.  Head bleed ruled out given anticoagulation on coumadin with CT scan.  After all results in, PCP consulted for admission.  Patient admitted to telemetry in stable condition.  No other acute concerns while in ED.  Patient safe for tele upon transfer.      Arloa Koh, MD 01/13/13 [phone]

## 2013-01-13 NOTE — Progress Notes (Signed)
*  PRELIMINARY RESULTS* Echocardiogram 2D Echocardiogram has been performed.  Penny [name] 01/13/2013, 4:56 PM

## 2013-01-13 NOTE — Consult Note (Signed)
CARDIOLOGY CONSULT NOTE   Patient ID: Penny [name] MRN: [id number] DOB/AGE: 1929/01/08 79 y.o.  Admit date: 01/12/2013  Primary Physician   Kari Baars, MD Primary Cardiologist   Janee Ureste Reason for Consultation   Syncope  JYN:[id number] D Newburn is a 79 y.o. female with a history of CAD, Atrial Fibrillation, mild dementia and hypertension who presented to the ED on 2/24 after an episode of loss of consciousness. Mrs. Penny [name] and her husband were preparing to leave [employer] when she complained of feeling dizzy and lightheaded. Per her husband, she was sitting down when she began to experience these symptoms. He states that she then dropped her head and he had a difficult time getting her attention and then her eyes closed. Per her husband, this episode lasted approximately 10 minutes and EMS was called. She had returned to her baseline upon their arrival. According to her daughter, Penny [name] was hypothermic in the ED with a rectal temp of 35.8C and was in A-fib with a rate of 50's and normotensive. A head CT was performed as Penny [name] stated that she fell a few days ago and was negative. During this brief episode of LOC, she denies bladder/bowel incontinence, seizure activity, chest pain, palpitations, SOB, and nausea but endorsed some vomiting. Family states that a similar episode happened 1 year ago after she fell and hit her head. We have been asked to see the patient as pauses were noted on her EKG. Upon review of all available strips, no pauses > 2 seconds and no prolonged HR < 55 noted.   Past Medical History  Diagnosis Date  . Long-term (current) use of anticoagulants   . Chronic shoulder pain   . Heart palpitations     rare  . Memory problem   . Depression   . Atrial fibrillation   . Hyperlipidemia   . Dyslipidemia      Past Surgical History  Procedure Laterality Date  . Total knee arthroplasty      right  . Partial hysterectomy    . Ureteral stent placement     bilateral / for stenosis    Allergies  Allergen Reactions  . Zocor (Simvastatin)   . Sulfa Antibiotics Rash    I have reviewed the patient's current medications . aspirin EC  81 mg Oral Daily  . atorvastatin  20 mg Oral q1800  . cefTRIAXone (ROCEPHIN)  IV  1 g Intravenous Q24H  . digoxin  0.125 mg Oral Custom  . donepezil  10 mg Oral QHS  . metoprolol tartrate  12.5 mg Oral q morning - 10a  . multivitamin with minerals  1 tablet Oral Daily  . ondansetron  4 mg Oral Once  . sodium chloride  3 mL Intravenous Q12H  . warfarin  5 mg Oral ONCE-1800  . Warfarin - Pharmacist Dosing Inpatient   Does not apply q1800   . sodium chloride 75 mL/hr at 01/13/13 0356   acetaminophen, acetaminophen, alum & mag hydroxide-simeth, bisacodyl, diazepam, docusate sodium, HYDROcodone-acetaminophen, zolpidem  Prior to Admission medications   Medication Sig Start Date End Date Taking? Authorizing Provider  Ascorbic Acid (VITAMIN C) 500 MG tablet Take 500 mg by mouth daily.     Yes Historical Provider, MD  cholecalciferol (VITAMIN D) 1000 UNITS tablet Take 2,000 Units by mouth daily.   Yes Historical Provider, MD  Cyanocobalamin (VITAMIN B 12 PO) Take 1 tablet by mouth daily.    Yes Historical Provider, MD  diazepam (VALIUM) 5 MG tablet  Take 2.5 mg by mouth every 12 (twelve) hours as needed for anxiety or sleep.   Yes Historical Provider, MD  digoxin (LANOXIN) 0.125 MG tablet Take 0.125 mg by mouth See admin instructions. Every day except none on Sunday and Wednesday   Yes Historical Provider, MD  docusate sodium (COLACE) 100 MG capsule Take 100 mg by mouth 2 (two) times daily as needed for constipation.    Yes Historical Provider, MD  donepezil (ARICEPT) 10 MG tablet Take 10 mg by mouth at bedtime.    Yes Historical Provider, MD  meclizine (ANTIVERT) 25 MG tablet Take 25 mg by mouth 3 (three) times daily as needed for dizziness.    Yes Historical Provider, MD  metoprolol tartrate (LOPRESSOR) 25 MG tablet  Take 12.5 mg by mouth every morning.   Yes Historical Provider, MD  multivitamin (THERAGRAN) per tablet Take 1 tablet by mouth daily.     Yes Historical Provider, MD  Omega-3 Fatty Acids (FISH OIL PO) Take 1,400 mg by mouth daily.    Yes Historical Provider, MD  rosuvastatin (CRESTOR) 10 MG tablet Take 5 mg by mouth daily. 06/05/12  Yes  , MD  warfarin (COUMADIN) 2.5 MG tablet Take 2.5 mg by mouth daily.   Yes Historical Provider, MD     History   Social History  . Marital Status: Married    Spouse Name: N/A    Number of Children: N/A  . Years of Education: N/A   Occupational History  . Not on file.   Social History Main Topics  . Smoking status: Former Smoker  . Smokeless tobacco: Not on file  . Alcohol Use: No  . Drug Use: No  . Sexually Active: Not on file   Other Topics Concern  . Not on file   Social History Narrative   Social History::   She is married. She has three children and five     grandchildren. 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 Father Deceased     19 93 / after a fall  . Mother Deceased 28's    heart disease   Family History  Problem Relation Age of Onset  . Heart disease Mother      ROS:  Full 14 point review of systems complete and found to be negative unless listed above.  Physical Exam: Blood pressure 113/71, pulse 81, temperature 97.5 F (36.4 C), temperature source Oral, resp. rate 20, height 5\' 6"  (1.676 m), weight 119 lb 12.8 oz (54.341 kg), SpO2 99.00%.  General: Well developed, well nourished, female in no acute distress Head: Eyes PERRLA, No xanthomas.   Normocephalic and atraumatic, oropharynx without edema or exudate. Dentition: good Lungs: Clear, diminished in bilateral bases. Heart:  Heart irregular, S1, S2. No murmur, rubs or gallops. Pulses are 1+ extrem.   Neck: No carotid bruits. No lymphadenopathy.  JVD not  elevated. Abdomen: Bowel sounds present, abdomen soft and non-tender without masses or hernias noted. Msk:  No spine or cva tenderness. R. Arm slightly weaker than L. Arm 2/2 shoulder repair, No other weakness noted, no joint deformities or effusions. Extremities: No clubbing or cyanosis. No    edema.  Neuro: Alert and oriented X 3. No focal deficits noted. Psych:  Good affect, responds appropriately Skin: No rashes or lesions noted.  Labs:   Lab Results  Component Value Date  WBC 10.7* 01/12/2013   HGB 13.3 01/12/2013   HCT 38.3 01/12/2013   MCV 86.7 01/12/2013   PLT 183 01/12/2013    Recent Labs  01/12/13 2303  INR 1.75*     Recent Labs Lab 01/12/13 2303  NA 142  K 3.9  CL 105  CO2 25  BUN 13  CREATININE 0.76  CALCIUM 9.8  PROT 6.6  BILITOT 0.5  ALKPHOS 76  ALT 7  AST 23  GLUCOSE 111*     Recent Labs  01/12/13 2304 01/13/13 0140 01/13/13 0810  TROPONINI <0.30 <0.30 <0.30   Echo: 07/20/2011 - Left ventricle: The cavity size was normal. Wall thickness was normal. Systolic function was mildly reduced. The estimated ejection fraction was in the range of 45% to 50%. Diffuse hypokinesis. - Aortic valve: Trivial regurgitation. - Left atrium: The atrium was moderately dilated. - Right ventricle: The cavity size was moderately dilated. Systolic function was mildly reduced. - Right atrium: The atrium was moderately to severely dilated. - Tricuspid valve: Severe regurgitation.    ECG:  01/12/13- A. Fib with rate of 67. RBBB. Appears unchanged from 10/28/12 EKG  Radiology:  Dg Chest 1 View 01/12/2013  *RADIOLOGY REPORT*  Clinical Data: Syncope and hypothermia.  CHEST - 1 VIEW  Comparison: 07/10/2011 chest radiograph  Findings: Cardiomegaly identified. There is no evidence of focal airspace disease, pulmonary edema, suspicious pulmonary nodule/mass, pleural effusion, or pneumothorax. No acute bony abnormalities are identified. Right shoulder hemiarthroplasty  again noted.  IMPRESSION: Cardiomegaly without evidence of acute cardiopulmonary disease.   Original Report Authenticated By: Harmon Pier, M.D.    Ct Head Wo Contrast 01/13/2013  *RADIOLOGY REPORT*  Clinical Data: Breech syncopal episode followed by nausea and vomiting.  Fall.  CT HEAD WITHOUT CONTRAST  Technique:  Contiguous axial images were obtained from the base of the skull through the vertex without contrast.  Comparison: 11/20/2007  Findings: Diffuse cerebral atrophy.  Mild ventricular dilatation consistent with central atrophy.  Patchy low attenuation changes in the deep white matter consistent with small vessel ischemic change. No mass effect or midline shift.  No abnormal extra-axial fluid collections.  Gray-white matter junctions are distinct.  Basal cisterns are not effaced.  No evidence of acute intracranial hemorrhage.  Vascular calcifications.  No depressed skull fractures.  Small air-fluid levels in the maxillary antra bilaterally.  Mastoid air cells are not opacified.  IMPRESSION: No acute intracranial abnormalities.  Air-fluid levels demonstrated in the maxillary antra.   Original Report Authenticated By: Burman Nieves, M.D.     ASSESSMENT AND PLAN:   The patient was seen today by Dr Patty Sermons,  the patient evaluated and the data reviewed.   1. Atrial Fibrillation: Rate 58-86, normotensive. Continue home regimen of Digoxin and Metoprolol for rate control. INR was found to be slightly subtherapeutic at 1.75. Will defer to primary MD/pharmacy for Coumadin management.  ECHO from 07/02/11 showed an EF 45-50%. Carotid dopplers and ECHO ordered; Awaiting results. No pauses > 2 seconds noted on tele. Troponins negative x2 thus far.  2. UTI: Rocephin administered while in the ED. Management per primary team.  3. Dementia: Management per primary team. Per family, she has become progressively weaker and may be falling at home putting her at risk for bleeding with Coumadin therapy. This should be  discussed prior to discharge.   Principal Problem:   Syncope and collapse Active Problems:   Atrial fibrillation   UTI (urinary tract infection)   Signed: Theodore Demark, PA 01/13/2013, 10:55 AM Co-Sign  MD Patient was seen with Theodore Demark PA.  The patient is known to me.  The patient has long history of atrial fibrillation.  She has progressive dementia.  The etiology of her episode at the conclusion of last evening's meal at the [employer] is not clear.  Her husband stated that she was unresponsive for perhaps 10 or 15 minutes but then when EMS arrived she awoke with a start and wondered what was going on.  She was not hypotensive at that time but her heart rate was in the 50s.  Since admission to the hospital she has had no further episodes and telemetry has shown atrial fibrillation with a slow ventricular response but no significant pauses greater than 2 seconds.  She denies any chest pain or increased dyspnea.  Physical examination is unremarkable except for the dementia and the atrial fibrillation. Plan will be to check echocardiogram this admission.  We will recommend reducing her digoxin to just 3 days a week to allow a faster heart rate.  Following discharge we will arrange for her to have an event monitor at home to look further for causes of her syncopal episode.

## 2013-01-13 NOTE — Progress Notes (Signed)
ANTICOAGULATION CONSULT NOTE - Initial Consult  Pharmacy Consult for coumadin Indication: atrial fibrillation  Allergies  Allergen Reactions  . Zocor (Simvastatin)   . Sulfa Antibiotics Rash    Patient Measurements: Height: 5\' 6"  (167.6 cm) Weight: 119 lb 12.8 oz (54.341 kg) IBW/kg (Calculated) : 59.3  Vital Signs: Temp: 97.5 F (36.4 C) (02/25 0313) Temp src: Oral (02/25 0313) BP: 113/71 mmHg (02/25 0313) Pulse Rate: 81 (02/25 0313)  Labs:  Recent Labs  01/12/13 2303 01/12/13 2304 01/13/13 0140  HGB 13.3  --   --   HCT 38.3  --   --   PLT 183  --   --   APTT 39*  --   --   LABPROT 19.8*  --   --   INR 1.75*  --   --   CREATININE 0.76  --   --   TROPONINI  --  <0.30 <0.30    Estimated Creatinine Clearance: 47.3 ml/min (by C-G formula based on Cr of 0.76).   Medical History: Past Medical History  Diagnosis Date  . Long-term (current) use of anticoagulants   . Chronic shoulder pain   . Heart palpitations     rare  . Memory problem   . Depression   . Atrial fibrillation   . Hyperlipidemia   . Dyslipidemia     Medications:  Prescriptions prior to admission  Medication Sig Dispense Refill  . Ascorbic Acid (VITAMIN C) 500 MG tablet Take 500 mg by mouth daily.        . cholecalciferol (VITAMIN D) 1000 UNITS tablet Take 2,000 Units by mouth daily.      . Cyanocobalamin (VITAMIN B 12 PO) Take 1 tablet by mouth daily.       . diazepam (VALIUM) 5 MG tablet Take 2.5 mg by mouth every 12 (twelve) hours as needed for anxiety or sleep.      [name] digoxin (LANOXIN) 0.125 MG tablet Take 0.125 mg by mouth See admin instructions. Every day except none on Sunday and Wednesday      . docusate sodium (COLACE) 100 MG capsule Take 100 mg by mouth 2 (two) times daily as needed for constipation.       [name] donepezil (ARICEPT) 10 MG tablet Take 10 mg by mouth at bedtime.       . meclizine (ANTIVERT) 25 MG tablet Take 25 mg by mouth 3 (three) times daily as needed for dizziness.        . metoprolol tartrate (LOPRESSOR) 25 MG tablet Take 12.5 mg by mouth every morning.      . multivitamin (THERAGRAN) per tablet Take 1 tablet by mouth daily.        . Omega-3 Fatty Acids (FISH OIL PO) Take 1,400 mg by mouth daily.       . rosuvastatin (CRESTOR) 10 MG tablet Take 5 mg by mouth daily.      [name] warfarin (COUMADIN) 2.5 MG tablet Take 2.5 mg by mouth daily.       Scheduled:  . aspirin EC  81 mg Oral Daily  . atorvastatin  20 mg Oral q1800  . [COMPLETED] cefTRIAXone (ROCEPHIN)  IV  1 g Intravenous Once  . cefTRIAXone (ROCEPHIN)  IV  1 g Intravenous Q24H  . [COMPLETED] diazepam  2 mg Oral Once  . digoxin  0.125 mg Oral Custom  . donepezil  10 mg Oral QHS  . metoprolol tartrate  12.5 mg Oral q morning - 10a  . multivitamin with minerals  1  tablet Oral Daily  . ondansetron  4 mg Oral Once  . sodium chloride  3 mL Intravenous Q12H  . [DISCONTINUED] sodium chloride   Intravenous STAT  . [DISCONTINUED] multivitamin  1 tablet Oral Daily  . [DISCONTINUED] warfarin  2.5 mg Oral q1800  . [DISCONTINUED] Warfarin - Physician Dosing Inpatient   Does not apply q1800    Assessment: 79 y.o. female who presents with with an episode of loss of consciousness. Patient on coumadin PTA and admit INR (01/12/13) was 1.75.  Patient noted on antibiotics for UTI.  Home coumadin dose: 2.5mg /day  Goal of Therapy:  INR 2-3 Monitor platelets by anticoagulation protocol: Yes   Plan:  -Coumadin 5mg  x1 today -Daily PT/INR  Harland German, Pharm D 01/13/2013 8:43 AM

## 2013-01-14 ENCOUNTER — Other Ambulatory Visit: Payer: Self-pay | Admitting: *Deleted

## 2013-01-14 ENCOUNTER — Ambulatory Visit (INDEPENDENT_AMBULATORY_CARE_PROVIDER_SITE_OTHER): Payer: Medicare Other

## 2013-01-14 DIAGNOSIS — R001 Bradycardia, unspecified: Secondary | ICD-10-CM | POA: Diagnosis present

## 2013-01-14 DIAGNOSIS — R55 Syncope and collapse: Secondary | ICD-10-CM

## 2013-01-14 LAB — BASIC METABOLIC PANEL
BUN: 11 mg/dL (ref 6–23)
CO2: 22 mEq/L (ref 19–32)
Calcium: 7.9 mg/dL — ABNORMAL LOW (ref 8.4–10.5)
Creatinine, Ser: 0.64 mg/dL (ref 0.50–1.10)
Glucose, Bld: 80 mg/dL (ref 70–99)
Sodium: 142 mEq/L (ref 135–145)

## 2013-01-14 LAB — CBC
HCT: 31.2 % — ABNORMAL LOW (ref 36.0–46.0)
Hemoglobin: 10.4 g/dL — ABNORMAL LOW (ref 12.0–15.0)
MCH: 29.8 pg (ref 26.0–34.0)
MCV: 89.4 fL (ref 78.0–100.0)
RBC: 3.49 MIL/uL — ABNORMAL LOW (ref 3.87–5.11)

## 2013-01-14 MED ORDER — CIPROFLOXACIN HCL 250 MG PO TABS: 250.0000 mg | ORAL_TABLET | Freq: Two times a day (BID) | ORAL | Status: AC

## 2013-01-14 MED ORDER — POTASSIUM CHLORIDE CRYS ER 20 MEQ PO TBCR
40.0000 meq | EXTENDED_RELEASE_TABLET | Freq: Once | ORAL | Status: AC
  Administered 2013-01-14: 40 meq via ORAL
  Filled 2013-01-14: qty 2

## 2013-01-14 MED ORDER — DIGOXIN 125 MCG PO TABS: ORAL_TABLET | ORAL | Status: DC

## 2013-01-14 NOTE — Discharge Summary (Signed)
DISCHARGE SUMMARY  Penny [name]  MR#: [id number]  DOB:January 10, 1929  Date of Admission: 01/12/2013 Date of Discharge: 01/14/2013  Attending Physician:Ciji Boston,W DOUGLAS  Patient's YQM:VHQI,O Riley Lam, MD  Consults: Cassell Clement, MD- [facility]  Discharge Diagnoses: Principal Problem:   Syncope and collapse Active Problems:   Atrial fibrillation   UTI (urinary tract infection)   Bradycardia  Past Medical History: Atrial fibrillation, on anticoagulation.  Hypertension  Hyperlipidemia  Anxiety Disorder/Depression  Mild Dementia  Osteoporosis with history of right shoulder fracture status post right hemiarthroplasty (2008)  History of shingles  History of left kidney mass with a negative biopsy in 2008  History of nephrolithiasis  History of right olecranon pressure ulcer   Past Surgical History  Status post right total knee replacement.  Status post hysterectomy.  Status post bladder tack.  History of lithotripsy (2008).  History of right upper extremity sympathetic block due to reflex sympathetic dystrophy.   Discharge Medications:   Medication List    TAKE these medications       cholecalciferol 1000 UNITS tablet  Commonly known as:  VITAMIN D  Take 2,000 Units by mouth daily.     ciprofloxacin 250 MG tablet  Commonly known as:  CIPRO  Take 1 tablet (250 mg total) by mouth 2 (two) times daily.     diazepam 5 MG tablet  Commonly known as:  VALIUM  Take 2.5 mg by mouth every 12 (twelve) hours as needed for anxiety or sleep.     digoxin 0.125 MG tablet  Commonly known as:  LANOXIN  1 pill on Monday, Wednesday, and Friday     docusate sodium 100 MG capsule  Commonly known as:  COLACE  Take 100 mg by mouth 2 (two) times daily as needed for constipation.     donepezil 10 MG tablet  Commonly known as:  ARICEPT  Take 10 mg by mouth at bedtime.     FISH OIL PO  Take 1,400 mg by mouth daily.     meclizine 25 MG tablet  Commonly known as:  ANTIVERT   Take 25 mg by mouth 3 (three) times daily as needed for dizziness.     metoprolol tartrate 25 MG tablet  Commonly known as:  LOPRESSOR  Take 12.5 mg by mouth every morning.     multivitamin per tablet  Take 1 tablet by mouth daily.     rosuvastatin 10 MG tablet  Commonly known as:  CRESTOR  Take 5 mg by mouth daily.     VITAMIN B 12 PO  Take 1 tablet by mouth daily.     vitamin C 500 MG tablet  Commonly known as:  ASCORBIC ACID  Take 500 mg by mouth daily.     warfarin 2.5 MG tablet  Commonly known as:  COUMADIN  Take 2.5 mg by mouth daily.        Hospital Procedures: Dg Chest 1 View  01/12/2013  *RADIOLOGY REPORT*  Clinical Data: Syncope and hypothermia.  CHEST - 1 VIEW  Comparison: 07/10/2011 chest radiograph  Findings: Cardiomegaly identified. There is no evidence of focal airspace disease, pulmonary edema, suspicious pulmonary nodule/mass, pleural effusion, or pneumothorax. No acute bony abnormalities are identified. Right shoulder hemiarthroplasty again noted.  IMPRESSION: Cardiomegaly without evidence of acute cardiopulmonary disease.   Original Report Authenticated By: Harmon Pier, M.D.    Ct Head Wo Contrast  01/13/2013  *RADIOLOGY REPORT*  Clinical Data: Breech syncopal episode followed by nausea and vomiting.  Fall.  CT HEAD WITHOUT CONTRAST  Technique:  Contiguous axial images were obtained from the base of the skull through the vertex without contrast.  Comparison: 11/20/2007  Findings: Diffuse cerebral atrophy.  Mild ventricular dilatation consistent with central atrophy.  Patchy low attenuation changes in the deep white matter consistent with small vessel ischemic change. No mass effect or midline shift.  No abnormal extra-axial fluid collections.  Gray-white matter junctions are distinct.  Basal cisterns are not effaced.  No evidence of acute intracranial hemorrhage.  Vascular calcifications.  No depressed skull fractures.  Small air-fluid levels in the maxillary antra  bilaterally.  Mastoid air cells are not opacified.  IMPRESSION: No acute intracranial abnormalities.  Air-fluid levels demonstrated in the maxillary antra.   Original Report Authenticated By: Burman Nieves, M.D.    Echocardiogram (01/13/13)- - Left ventricle: The cavity size was normal. Wall thickness was normal. Systolic function was normal. The estimated ejection fraction was in the range of 55% to 60%. Wall motion was normal; there were no regional wall motion abnormalities. - Aortic valve: Trivial regurgitation. - Mitral valve: Mild regurgitation. - Left atrium: The atrium was mildly dilated. - Right ventricle: The cavity size was mildly dilated. - Right atrium: The atrium was severely dilated. - Tricuspid valve: Severe regurgitation. - Pulmonary arteries: Systolic pressure was mildly Increased.  Carotid Duplex (01/13/13)- no significant ICA stenosis or plaque noted bilaterally. Vertebral artery flow is antegrade  Telemetry- Afib with slow ventricular response (rates 40s-70); no other events   History of Present Illness: Penny [name] is an 79 year old white female with a history of atrial fibrillation on anticoagulation, mild dementia, and hypertension who presented to the emergency department with an episode of loss of consciousness. Patient states that she's been dealing with an upper respiratory infection for the past 3-4 days which was getting better. She was not taking any medications for this. She went to dinner with her husband at [employer]. They were getting ready to leave after eating and she stated she felt dizzy. She continued to sit and then slumped over. Her husband states that she was unresponsive for a short period of time before returning to normal level of consciousness. There was no loss of bladder or bowel continence. No apparent seizure activity. She denies any chest pain, palpitations, nausea, or vomiting with this episode. She does have permanent atrial  fibrillation and is on AV nodal agents for rate control but no other history of dysrhythmia or syncope. Her last echocardiogram in 8/12 showed an ejection fraction of 45-50%. She is followed by Dr. Patty Sermons is her cardiologist. Given the loss of consciousness, EMS was called she was brought to the emergency department where head CT was negative. EKG shows atrial fibrillation with intermittent pause. Cardiac enzymes negative. Urinalysis significant for probable urinary tract infection.   Hospital Course: Penny [name] was admitted to a telemetry bed.  She was placed on gentle IV fluid hydration and antibiotics for her urinary tract infection. An echocardiogram and carotid Dopplers were performed for further evaluation of underlying cause of syncope. These were unrevealing with results as above showing a preserved ejection fraction with no significant valvular abnormalities and no significant carotid artery stenosis. Cardiology was consulted given her known atrial fibrillation with slow ventricular response. Her heart rate on admission was in the 50s. The recommended decreasing her digoxin to Monday, Wednesday, and Friday. Telemetry did show rates as low as the 40s while at rest; however, she was asymptomatic with this.  She has not had any further episodes of lightheadedness, dizziness,  near syncope, or syncope. Cardiac enzymes were negative. At this time, pending final cardiology review she is stable for discharge home with close outpatient followup. Arrangements will be made to the cardiology office to have her wear an event monitor. She will continue on Cipro to complete one week of antibiotics for a urinary tract infection. Urine cultures are growing greater than 100,000 gram-negative rods with final identification pending at this time.  It is not clear that this had any role in her acute presentation and she has no urinary symptoms.    Day of Discharge Exam BP 104/56  Pulse 64  Temp(Src) 97.5 F (36.4  C) (Oral)  Resp 16  Ht 5\' 6"  (1.676 m)  Wt 55.475 kg (122 lb 4.8 oz)  BMI 19.75 kg/m2  SpO2 97%  Physical Exam: General appearance: alert and no distress Eyes: no scleral icterus Throat: oropharynx moist without erythema Resp: clear to auscultation bilaterally Cardio: irregularly irregular rhythm GI: soft, non-tender; bowel sounds normal; no masses,  no organomegaly Extremities: no clubbing, cyanosis or edema Neurologic: no focal deficits  Discharge Labs:  Recent Labs  01/12/13 2303 01/14/13 0610  NA 142 142  K 3.9 3.3*  CL 105 113*  CO2 25 22  GLUCOSE 111* 80  BUN 13 11  CREATININE 0.76 0.64  CALCIUM 9.8 7.9*    Recent Labs  01/12/13 2303  AST 23  ALT 7  ALKPHOS 76  BILITOT 0.5  PROT 6.6  ALBUMIN 3.1*    Recent Labs  01/12/13 2303 01/14/13 0610  WBC 10.7* 4.3  NEUTROABS 9.0*  --   HGB 13.3 10.4*  HCT 38.3 31.2*  MCV 86.7 89.4  PLT 183 137*   Lab Results  Component Value Date   INR 1.93* 01/14/2013   INR 1.75* 01/12/2013   INR 2.19* 07/11/2011    Recent Labs  01/13/13 0810 01/13/13 1713 01/13/13 1856  TROPONINI <0.30 <0.30 <0.30    Discharge instructions:     Discharge Orders   Future Appointments Provider Department Dept Phone   02/23/2013 10:45 AM Cassell Clement, MD [facility] Main Office [place]) [phone]   Future Orders Complete By Expires     Diet - low sodium heart healthy  As directed     Discharge instructions  As directed     Comments:      If you feel lightheaded sit or lie down in a safe place.  Fall Precautions.  Call if you have lightheadedness, dizziness, or shortness of breath.  Have INR checked at Dr. Yevonne Pax office within 1 week    Driving Restrictions  As directed     Comments:      Do not drive until follow-up with physician    Increase activity slowly  As directed        Disposition: to home with her husband  Follow-up Appts: Follow-up with Dr. Clelia Croft at [facility] in 1 week.   Follow-up with Dr. Patty Sermons for INR check and Event Monitor within 1 week.  Condition on Discharge: stable  Tests Needing Follow-up: urine/blood cultures  CODE STATUS: FULL CODE  Time with discharge activities: 35 minutes  Signed: Elion Hocker,W DOUGLAS 01/14/2013, 7:53 AM

## 2013-01-14 NOTE — Progress Notes (Signed)
   Subjective:  Patient feels well. Will be discharged today. Telemetry shows atrial fib with controlled VR.  No symptoms of dizziness.  Objective:  Vital Signs in the last 24 hours: Temp:  [97.5 F (36.4 C)-97.7 F (36.5 C)] 97.5 F (36.4 C) (02/26 0500) Pulse Rate:  [64-83] 64 (02/26 0500) Resp:  [16-20] 16 (02/26 0500) BP: (98-116)/(56-76) 104/56 mmHg (02/26 0500) SpO2:  [95 %-97 %] 97 % (02/26 0500) Weight:  [122 lb 4.8 oz (55.475 kg)] 122 lb 4.8 oz (55.475 kg) (02/26 0500)  Intake/Output from previous day: 02/25 0701 - 02/26 0700 In: 440 [P.O.:440] Out: 250 [Urine:250] Intake/Output from this shift: Total I/O In: 240 [P.O.:240] Out: -   . aspirin EC  81 mg Oral Daily  . atorvastatin  20 mg Oral q1800  . cefTRIAXone (ROCEPHIN)  IV  1 g Intravenous Q24H  . digoxin  0.125 mg Oral Custom  . donepezil  10 mg Oral QHS  . metoprolol tartrate  12.5 mg Oral q morning - 10a  . multivitamin with minerals  1 tablet Oral Daily  . potassium chloride  40 mEq Oral Once  . sodium chloride  3 mL Intravenous Q12H  . Warfarin - Pharmacist Dosing Inpatient   Does not apply q1800      Physical Exam: The patient appears to be in no distress. General: Well developed, well nourished, female in no acute distress  Head: Eyes PERRLA, No xanthomas. Normocephalic and atraumatic, oropharynx without edema or exudate. Dentition: good  Lungs: Clear, diminished in bilateral bases.  Heart: Heart irregular, S1, S2. No murmur, rubs or gallops. Pulses are 1+ extrem.  Neck: No carotid bruits. No lymphadenopathy. JVD not elevated.  Abdomen: Bowel sounds present, abdomen soft and non-tender without masses or hernias noted.  Msk: No spine or cva tenderness. R. Arm slightly weaker than L. Arm 2/2 shoulder repair, No other weakness noted, no joint deformities or effusions.  Extremities: No clubbing or cyanosis. No    Lab Results:  Recent Labs  01/12/13 2303 01/14/13 0610  WBC 10.7* 4.3  HGB 13.3  10.4*  PLT 183 137*    Recent Labs  01/12/13 2303 01/14/13 0610  NA 142 142  K 3.9 3.3*  CL 105 113*  CO2 25 22  GLUCOSE 111* 80  BUN 13 11  CREATININE 0.76 0.64    Recent Labs  01/13/13 1713 01/13/13 1856  TROPONINI <0.30 <0.30   Hepatic Function Panel  Recent Labs  01/12/13 2303  PROT 6.6  ALBUMIN 3.1*  AST 23  ALT 7  ALKPHOS 76  BILITOT 0.5   No results found for this basename: CHOL,  in the last 72 hours No results found for this basename: PROTIME,  in the last 72 hours  Imaging: Imaging results have been reviewed  Cardiac Studies: Telemetry shows AF with controlled VR Assessment/Plan:  1. Syncope uncertain etiology 2. Chronic atrial fib 3. Dementia  Plan: Agree with plans to discharge today.  She will stop by our office and Regis Bill LPN will place event monitor today. She will be seen back in our office one week for followup OV.   LOS: 2 days    Cassell Clement 01/14/2013, 10:03 AM

## 2013-01-14 NOTE — Progress Notes (Signed)
DC IV, DC Tele, DC Home Discharge instructions and home medications discussed with patient and patient's husband. Patient and husband denied any questions or concerns at this time. Patient leaving unit via wheelchair and appears in no acute distress. 

## 2013-01-15 LAB — URINE CULTURE

## 2013-01-15 NOTE — Progress Notes (Signed)
Regis Bill placed a 30 event monitor on patient

## 2013-01-19 LAB — CULTURE, BLOOD (ROUTINE X 2): Culture: NO GROWTH

## 2013-01-21 ENCOUNTER — Telehealth: Payer: Self-pay | Admitting: Cardiology

## 2013-01-21 NOTE — Telephone Encounter (Signed)
New problem    Aware that Penny [name] is off today   Patient was discharged on  last Wednesday from hospital have a couple of questions    1.how long will she be on the monitor  2. Coumadin check was done by Dr. Clelia Croft on yesterday .  Results was 1.4

## 2013-01-21 NOTE — Telephone Encounter (Signed)
Answered pt's questions.

## 2013-02-09 ENCOUNTER — Other Ambulatory Visit: Payer: Self-pay | Admitting: *Deleted

## 2013-02-09 DIAGNOSIS — F419 Anxiety disorder, unspecified: Secondary | ICD-10-CM

## 2013-02-09 MED ORDER — DIAZEPAM 5 MG PO TABS: 2.5000 mg | ORAL_TABLET | Freq: Two times a day (BID) | ORAL | Status: DC | PRN

## 2013-02-09 NOTE — Telephone Encounter (Signed)
Pt needs a refill on Diazepam. Will send to [facility] nurse

## 2013-02-18 ENCOUNTER — Telehealth: Payer: Self-pay | Admitting: *Deleted

## 2013-02-18 NOTE — Telephone Encounter (Signed)
Advised husband, afib but no significant arrythmia. Will discuss further at appointment next week

## 2013-02-23 ENCOUNTER — Ambulatory Visit (INDEPENDENT_AMBULATORY_CARE_PROVIDER_SITE_OTHER): Payer: Medicare Other | Admitting: Cardiology

## 2013-02-23 ENCOUNTER — Encounter: Payer: Self-pay | Admitting: Cardiology

## 2013-02-23 VITALS — BP 124/76 | HR 75 | Ht 65.5 in | Wt 118.8 lb

## 2013-02-23 DIAGNOSIS — R634 Abnormal weight loss: Secondary | ICD-10-CM

## 2013-02-23 DIAGNOSIS — I4891 Unspecified atrial fibrillation: Secondary | ICD-10-CM

## 2013-02-23 NOTE — Assessment & Plan Note (Signed)
The patient has lost another 3 pounds since last visit.  Her husband states she eats like a bird.  Her husband is trying to get her to drink a can of Ensure or boost daily and I encouraged her to do that.

## 2013-02-23 NOTE — Assessment & Plan Note (Signed)
The patient has not had any recent episodes of lightheadedness or dizziness

## 2013-02-23 NOTE — Progress Notes (Signed)
Penny [name] Date of Birth:  1929/06/07 [facility] [address] [place], [place]  [address] [phone]         Fax   [phone]  History of Present Illness: This pleasant elderly woman is seen for a scheduled followup office visit. She has a history of chronic atrial fibrillation. She also has a past history of syncope and collapse which occurred at about the beginning of July 2013. She was found to have low blood pressure following that episode and her lisinopril was stopped.  Subsequently the patient did well until 01/12/13 when she had another episode of syncope at a restaurant and was hospitalized briefly.  She subsequently wore a 30 day event monitor.  The event monitor did not show any cause for her syncope.  She was in constant atrial fibrillation with a controlled ventricular response and no excessive bradycardia or tachycardia was seen.  The patient reports that she has been feeling better.  Her daughter who is a Engineer, civil (consulting) also states that she thinks that her mother has been doing better since we cut her back on her digoxin to just a small dose of 0.125 Monday Wednesday and Friday only.  Current Outpatient Prescriptions  Medication Sig Dispense Refill  . Ascorbic Acid (VITAMIN C) 500 MG tablet Take 500 mg by mouth daily.        . cholecalciferol (VITAMIN D) 1000 UNITS tablet Take 2,000 Units by mouth daily.      . Cyanocobalamin (VITAMIN B 12 PO) Take 1 tablet by mouth daily.       . diazepam (VALIUM) 5 MG tablet Take 0.5 tablets (2.5 mg total) by mouth every 12 (twelve) hours as needed.  30 tablet  5  . digoxin (LANOXIN) 0.125 MG tablet 1 pill on Monday, Wednesday, and Friday  30 tablet  6  . docusate sodium (COLACE) 100 MG capsule Take 100 mg by mouth 2 (two) times daily as needed for constipation.       [name] donepezil (ARICEPT) 10 MG tablet Take 10 mg by mouth at bedtime.       . meclizine (ANTIVERT) 25 MG tablet Take 25 mg by mouth 3 (three) times daily as needed  for dizziness.       . metoprolol tartrate (LOPRESSOR) 25 MG tablet Take 12.5 mg by mouth every morning.      . multivitamin (THERAGRAN) per tablet Take 1 tablet by mouth daily.        . Omega-3 Fatty Acids (FISH OIL PO) Take 1,400 mg by mouth daily.       . rosuvastatin (CRESTOR) 10 MG tablet Take 5 mg by mouth daily.      [name] warfarin (COUMADIN) 2.5 MG tablet Take 2.5 mg by mouth daily.       No current facility-administered medications for this visit.    Allergies  Allergen Reactions  . Zocor (Simvastatin)   . Sulfa Antibiotics Rash    Patient Active Problem List  Diagnosis  . Atrial fibrillation  . Long term current use of anticoagulant  . Malaise and fatigue  . Right bundle branch block  . Hypercholesterolemia  . Dizziness - light-headed  . Inguinal hernia  . Weight loss, unintentional  . Syncope and collapse  . UTI (urinary tract infection)  . Bradycardia    History  Smoking status  . Former Smoker  Smokeless tobacco  . Not on file    History  Alcohol Use No    Family History  Problem  Relation Age of Onset  . Heart disease Mother     Review of Systems: Constitutional: no fever chills diaphoresis or fatigue or change in weight.  Head and neck: no hearing loss, no epistaxis, no photophobia or visual disturbance. Respiratory: No cough, shortness of breath or wheezing. Cardiovascular: No chest pain peripheral edema, palpitations. Gastrointestinal: No abdominal distention, no abdominal pain, no change in bowel habits hematochezia or melena. Genitourinary: No dysuria, no frequency, no urgency, no nocturia. Musculoskeletal:No arthralgias, no back pain, no gait disturbance or myalgias. Neurological: No dizziness, no headaches, no numbness, no seizures, no syncope, no weakness, no tremors. Hematologic: No lymphadenopathy, no easy bruising. Psychiatric: No confusion, no hallucinations, no sleep disturbance.    Physical Exam: Filed Vitals:   02/23/13 1114  BP:  124/76  Pulse: 75   the general appearance reveals a well-developed well-nourished woman in no distress.  She does have dementia and tends to repeat questions.The head and neck exam reveals pupils equal and reactive.  Extraocular movements are full.  There is no scleral icterus.  The mouth and pharynx are normal.  The neck is supple.  The carotids reveal no bruits.  The jugular venous pressure is normal.  The  thyroid is not enlarged.  There is no lymphadenopathy.  The chest is clear to percussion and auscultation.  There are no rales or rhonchi.  Expansion of the chest is symmetrical.  The precordium is quiet.  The pulse is irregularly irregular The first heart sound is normal.  The second heart sound is physiologically split.  There is no murmur gallop rub or click.  There is no abnormal lift or heave.  The abdomen is soft and nontender.  The bowel sounds are normal.  The liver and spleen are not enlarged.  There are no abdominal masses.  There are no abdominal bruits.  Extremities reveal good pedal pulses.  There is no phlebitis or edema.  There is no cyanosis or clubbing.  Strength is normal and symmetrical in all extremities.  The right arm and shoulder are weak following prior shoulder surgery.  There are no sensory deficits.  The skin is warm and dry.  There is no rash.  We reviewed the 30 day event monitor with the patient and her husband today.  It showed permanent atrial fibrillation but otherwise unremarkable.   Assessment / Plan: The patient is to continue her same medication.  Recheck in 4 months for followup office visit and EKG.

## 2013-02-23 NOTE — Assessment & Plan Note (Signed)
The patient is in chronic established permanent atrial fibrillation.  She is on warfarin managed by Dr. Clelia Croft.  She has not had any TIA or stroke symptoms

## 2013-02-23 NOTE — Patient Instructions (Addendum)
Your physician recommends that you continue on your current medications as directed. Please refer to the Current Medication list given to you today.  Your physician wants you to follow-up in: 4 month ov/ekg  You will receive a reminder letter in the mail two months in advance. If you don't receive a letter, please call our office to schedule the follow-up appointment.  

## 2013-03-10 ENCOUNTER — Encounter: Payer: Self-pay | Admitting: Cardiology

## 2013-03-11 ENCOUNTER — Encounter: Payer: Self-pay | Admitting: Cardiovascular Disease

## 2013-03-11 ENCOUNTER — Encounter: Payer: Self-pay | Admitting: Cardiology

## 2013-04-07 ENCOUNTER — Telehealth: Payer: Self-pay | Admitting: *Deleted

## 2013-04-07 NOTE — Telephone Encounter (Signed)
Left message to call back  

## 2013-04-07 NOTE — Telephone Encounter (Signed)
Patient left message on refill line for [facility] to call her back.   Micki Riley, CMA

## 2013-04-09 NOTE — Telephone Encounter (Signed)
Left another message, if she needs anything to call back. Unsure why patient called

## 2013-05-14 ENCOUNTER — Other Ambulatory Visit: Payer: Self-pay | Admitting: Urology

## 2013-05-14 ENCOUNTER — Other Ambulatory Visit: Payer: Self-pay | Admitting: Radiology

## 2013-05-14 DIAGNOSIS — N200 Calculus of kidney: Secondary | ICD-10-CM

## 2013-05-20 ENCOUNTER — Telehealth: Payer: Self-pay | Admitting: Cardiology

## 2013-05-20 NOTE — Telephone Encounter (Signed)
New problem  Pt husband said wife is going to have surgery and he wants to speak with you regarding her medication.

## 2013-05-20 NOTE — Telephone Encounter (Signed)
Left message to call back  

## 2013-05-21 NOTE — Telephone Encounter (Signed)
Follow-up:    Patient's husband called in returning your call.  Please call back.

## 2013-05-21 NOTE — Telephone Encounter (Signed)
Discussed with  Dr. Patty Sermons and ok to change. Advised husband

## 2013-05-21 NOTE — Telephone Encounter (Signed)
PCP wants to change her from Warfarin to Xarelto secondary to INR fluctuating. Will forward to  Dr. Patty Sermons for review

## 2013-05-26 ENCOUNTER — Encounter (HOSPITAL_COMMUNITY): Payer: Self-pay | Admitting: Pharmacy Technician

## 2013-05-26 NOTE — Progress Notes (Signed)
EKG 01/13/13 on EPIC, Chest x-ray 01/12/13 on EPIC

## 2013-05-26 NOTE — Patient Instructions (Addendum)
22 Penny [name]  05/26/2013   Your procedure is scheduled on: 06/04/13  Report to [facility] at 7:30 AM.  Call this number if you have problems the morning of surgery [phone]: [phone]   Remember:   Do not eat food or drink liquids After Midnight.     Take these medicines the morning of surgery with A SIP OF WATER: lopressor   Do not wear jewelry, make-up or nail polish.  Do not wear lotions, powders, or perfumes. You may wear deodorant.  Do not shave 48 hours prior to surgery. Men may shave face and neck.  Do not bring valuables to the hospital.  Contacts, dentures or bridgework may not be worn into surgery.  Leave suitcase in the car. After surgery it may be brought to your room.  For patients admitted to the hospital, checkout time is 11:00 AM the day of discharge.    Please read over the following fact sheets that you were given:  blood fact sheet Birdie Sons, RN  pre op nurse call if needed [phone]    FAILURE TO FOLLOW THESE INSTRUCTIONS MAY RESULT IN CANCELLATION OF YOUR SURGERY   Patient Signature: ___________________________________________

## 2013-05-27 ENCOUNTER — Encounter (HOSPITAL_COMMUNITY)
Admission: RE | Admit: 2013-05-27 | Discharge: 2013-05-27 | Disposition: A | Discharge status: Home / Self Care | Payer: Medicare Other | Source: Ambulatory Visit | Attending: Urology | Admitting: Urology

## 2013-05-27 ENCOUNTER — Encounter (HOSPITAL_COMMUNITY): Payer: Self-pay

## 2013-05-27 DIAGNOSIS — Z01812 Encounter for preprocedural laboratory examination: Secondary | ICD-10-CM | POA: Insufficient documentation

## 2013-05-27 DIAGNOSIS — N200 Calculus of kidney: Secondary | ICD-10-CM | POA: Insufficient documentation

## 2013-05-27 HISTORY — DX: Anxiety disorder, unspecified: F41.9

## 2013-05-27 HISTORY — DX: Other seasonal allergic rhinitis: J30.2

## 2013-05-27 LAB — CBC
HCT: 42.5 % (ref 36.0–46.0)
MCH: 29.2 pg (ref 26.0–34.0)
MCV: 91.4 fL (ref 78.0–100.0)
RBC: 4.65 MIL/uL (ref 3.87–5.11)
WBC: 5.1 10*3/uL (ref 4.0–10.5)

## 2013-05-27 LAB — BASIC METABOLIC PANEL
BUN: 24 mg/dL — ABNORMAL HIGH (ref 6–23)
CO2: 31 mEq/L (ref 19–32)
Chloride: 107 mEq/L (ref 96–112)
Creatinine, Ser: 0.92 mg/dL (ref 0.50–1.10)

## 2013-05-27 LAB — ABO/RH: ABO/RH(D): O POS

## 2013-05-28 ENCOUNTER — Encounter (HOSPITAL_COMMUNITY): Payer: Self-pay | Admitting: Pharmacy Technician

## 2013-06-01 ENCOUNTER — Other Ambulatory Visit: Payer: Self-pay | Admitting: Radiology

## 2013-06-03 ENCOUNTER — Other Ambulatory Visit: Payer: Self-pay | Admitting: Radiology

## 2013-06-04 ENCOUNTER — Encounter (HOSPITAL_COMMUNITY): Payer: Self-pay | Admitting: *Deleted

## 2013-06-04 ENCOUNTER — Ambulatory Visit (HOSPITAL_COMMUNITY): Payer: Medicare Other | Admitting: Anesthesiology

## 2013-06-04 ENCOUNTER — Inpatient Hospital Stay (HOSPITAL_COMMUNITY)
Admission: RE | Admit: 2013-06-04 | Discharge: 2013-06-07 | DRG: 661 | Disposition: A | Discharge status: Home / Self Care | Payer: Medicare Other | Source: Ambulatory Visit | Attending: Urology | Admitting: Urology

## 2013-06-04 ENCOUNTER — Encounter (HOSPITAL_COMMUNITY): Payer: Self-pay

## 2013-06-04 ENCOUNTER — Encounter (HOSPITAL_COMMUNITY): Payer: Self-pay | Admitting: Anesthesiology

## 2013-06-04 ENCOUNTER — Ambulatory Visit (HOSPITAL_COMMUNITY)
Admission: RE | Admit: 2013-06-04 | Discharge: 2013-06-04 | Disposition: A | Discharge status: Home / Self Care | Payer: Medicare Other | Source: Ambulatory Visit | Attending: Urology | Admitting: Urology

## 2013-06-04 ENCOUNTER — Encounter (HOSPITAL_COMMUNITY)
Admission: RE | Disposition: A | Discharge status: Home / Self Care | Payer: Self-pay | Source: Ambulatory Visit | Attending: Urology

## 2013-06-04 ENCOUNTER — Ambulatory Visit (HOSPITAL_COMMUNITY): Payer: Medicare Other

## 2013-06-04 VITALS — BP 120/57 | HR 67 | Temp 97.6°F | Resp 7 | Ht 65.0 in | Wt 120.0 lb

## 2013-06-04 DIAGNOSIS — N200 Calculus of kidney: Secondary | ICD-10-CM

## 2013-06-04 DIAGNOSIS — Z87442 Personal history of urinary calculi: Secondary | ICD-10-CM

## 2013-06-04 DIAGNOSIS — Z79899 Other long term (current) drug therapy: Secondary | ICD-10-CM

## 2013-06-04 DIAGNOSIS — I4891 Unspecified atrial fibrillation: Secondary | ICD-10-CM | POA: Diagnosis present

## 2013-06-04 DIAGNOSIS — F3289 Other specified depressive episodes: Secondary | ICD-10-CM | POA: Diagnosis present

## 2013-06-04 DIAGNOSIS — N390 Urinary tract infection, site not specified: Secondary | ICD-10-CM

## 2013-06-04 DIAGNOSIS — Z7901 Long term (current) use of anticoagulants: Secondary | ICD-10-CM

## 2013-06-04 DIAGNOSIS — E785 Hyperlipidemia, unspecified: Secondary | ICD-10-CM | POA: Diagnosis present

## 2013-06-04 DIAGNOSIS — F329 Major depressive disorder, single episode, unspecified: Secondary | ICD-10-CM | POA: Diagnosis present

## 2013-06-04 DIAGNOSIS — F411 Generalized anxiety disorder: Secondary | ICD-10-CM | POA: Diagnosis present

## 2013-06-04 HISTORY — PX: NEPHROLITHOTOMY: SHX5134

## 2013-06-04 LAB — PROTIME-INR
INR: 1.14 (ref 0.00–1.49)
Prothrombin Time: 14.4 seconds (ref 11.6–15.2)

## 2013-06-04 LAB — BASIC METABOLIC PANEL
BUN: 18 mg/dL (ref 6–23)
GFR calc Af Amer: >90 mL/min (ref >90)
GFR calc non Af Amer: 78 mL/min — ABNORMAL LOW (ref >90)
Potassium: 3.4 mEq/L — ABNORMAL LOW (ref 3.5–5.1)
Sodium: 140 mEq/L (ref 135–145)

## 2013-06-04 SURGERY — NEPHROLITHOTOMY PERCUTANEOUS
Anesthesia: General | Laterality: Right | Wound class: Clean Contaminated

## 2013-06-04 MED ORDER — FENTANYL CITRATE 0.05 MG/ML IJ SOLN
INTRAMUSCULAR | Status: AC
  Filled 2013-06-04: qty 6

## 2013-06-04 MED ORDER — ONDANSETRON HCL 4 MG/2ML IJ SOLN
4.0000 mg | Freq: Four times a day (QID) | INTRAMUSCULAR | Status: DC | PRN
  Administered 2013-06-04 – 2013-06-06 (×3): 4 mg via INTRAVENOUS
  Filled 2013-06-04 (×3): qty 2

## 2013-06-04 MED ORDER — CIPROFLOXACIN IN D5W 400 MG/200ML IV SOLN
INTRAVENOUS | Status: AC
  Filled 2013-06-04: qty 200

## 2013-06-04 MED ORDER — SODIUM CHLORIDE 0.9 % IV SOLN
INTRAVENOUS | Status: DC
  Administered 2013-06-04: 07:00:00 via INTRAVENOUS

## 2013-06-04 MED ORDER — OXYCODONE HCL 5 MG PO TABS
5.0000 mg | ORAL_TABLET | ORAL | Status: DC | PRN
  Administered 2013-06-05 – 2013-06-07 (×4): 5 mg via ORAL
  Filled 2013-06-04 (×4): qty 1

## 2013-06-04 MED ORDER — DEXTROSE-NACL 5-0.45 % IV SOLN
INTRAVENOUS | Status: DC
  Administered 2013-06-04 – 2013-06-06 (×5): via INTRAVENOUS

## 2013-06-04 MED ORDER — DEXAMETHASONE SODIUM PHOSPHATE 10 MG/ML IJ SOLN
INTRAMUSCULAR | Status: DC | PRN
  Administered 2013-06-04: 10 mg via INTRAVENOUS

## 2013-06-04 MED ORDER — MECLIZINE HCL 25 MG PO TABS
25.0000 mg | ORAL_TABLET | Freq: Three times a day (TID) | ORAL | Status: DC | PRN
  Filled 2013-06-04: qty 1

## 2013-06-04 MED ORDER — EPHEDRINE SULFATE 50 MG/ML IJ SOLN
INTRAMUSCULAR | Status: DC | PRN
  Administered 2013-06-04: 5 mg via INTRAVENOUS

## 2013-06-04 MED ORDER — DONEPEZIL HCL 10 MG PO TABS
10.0000 mg | ORAL_TABLET | Freq: Every day | ORAL | Status: DC
  Administered 2013-06-04 – 2013-06-06 (×3): 10 mg via ORAL
  Filled 2013-06-04 (×4): qty 1

## 2013-06-04 MED ORDER — CIPROFLOXACIN IN D5W 400 MG/200ML IV SOLN
400.0000 mg | INTRAVENOUS | Status: AC
  Administered 2013-06-04: 400 mg via INTRAVENOUS

## 2013-06-04 MED ORDER — FENTANYL CITRATE 0.05 MG/ML IJ SOLN: 25.0000 ug | INTRAMUSCULAR | Status: DC | PRN

## 2013-06-04 MED ORDER — IOHEXOL 300 MG/ML  SOLN
INTRAMUSCULAR | Status: DC | PRN
  Administered 2013-06-04: 30 mL

## 2013-06-04 MED ORDER — DIAZEPAM 5 MG PO TABS
2.5000 mg | ORAL_TABLET | Freq: Two times a day (BID) | ORAL | Status: DC | PRN
  Administered 2013-06-04: 2.5 mg via ORAL
  Administered 2013-06-05: 5 mg via ORAL
  Filled 2013-06-04 (×2): qty 1

## 2013-06-04 MED ORDER — DOCUSATE SODIUM 100 MG PO CAPS
100.0000 mg | ORAL_CAPSULE | Freq: Two times a day (BID) | ORAL | Status: DC
  Administered 2013-06-04 – 2013-06-07 (×6): 100 mg via ORAL
  Filled 2013-06-04 (×7): qty 1

## 2013-06-04 MED ORDER — CIPROFLOXACIN HCL 250 MG PO TABS: 250.0000 mg | ORAL_TABLET | Freq: Two times a day (BID) | ORAL | Status: DC

## 2013-06-04 MED ORDER — IOHEXOL 300 MG/ML  SOLN
INTRAMUSCULAR | Status: AC
  Filled 2013-06-04: qty 2

## 2013-06-04 MED ORDER — PHENYLEPHRINE HCL 10 MG/ML IJ SOLN
INTRAMUSCULAR | Status: DC | PRN
  Administered 2013-06-04: 80 ug via INTRAVENOUS

## 2013-06-04 MED ORDER — DIGOXIN 125 MCG PO TABS
0.1250 mg | ORAL_TABLET | ORAL | Status: DC
  Administered 2013-06-05: 0.125 mg via ORAL
  Filled 2013-06-04 (×2): qty 1

## 2013-06-04 MED ORDER — CEFAZOLIN SODIUM-DEXTROSE 2-3 GM-% IV SOLR
INTRAVENOUS | Status: AC
  Filled 2013-06-04: qty 50

## 2013-06-04 MED ORDER — MIDAZOLAM HCL 2 MG/2ML IJ SOLN
INTRAMUSCULAR | Status: AC
  Filled 2013-06-04: qty 6

## 2013-06-04 MED ORDER — MORPHINE SULFATE 2 MG/ML IJ SOLN
1.0000 mg | INTRAMUSCULAR | Status: DC | PRN
  Administered 2013-06-04: 1 mg via INTRAVENOUS
  Administered 2013-06-05 (×2): 2 mg via INTRAVENOUS
  Administered 2013-06-05: 1 mg via INTRAVENOUS
  Administered 2013-06-06 (×2): 2 mg via INTRAVENOUS
  Filled 2013-06-04 (×6): qty 1

## 2013-06-04 MED ORDER — ONDANSETRON HCL 4 MG/2ML IJ SOLN
INTRAMUSCULAR | Status: AC
  Filled 2013-06-04: qty 2

## 2013-06-04 MED ORDER — MORPHINE SULFATE 10 MG/ML IJ SOLN
1.0000 mg | INTRAMUSCULAR | Status: DC | PRN
  Administered 2013-06-04: 1 mg via INTRAVENOUS
  Filled 2013-06-04: qty 1

## 2013-06-04 MED ORDER — LACTATED RINGERS IV SOLN
INTRAVENOUS | Status: DC
  Administered 2013-06-04 (×2): via INTRAVENOUS

## 2013-06-04 MED ORDER — SODIUM CHLORIDE 0.9 % IR SOLN
Status: DC | PRN
  Administered 2013-06-04: 6000 mL

## 2013-06-04 MED ORDER — FENTANYL CITRATE 0.05 MG/ML IJ SOLN
INTRAMUSCULAR | Status: AC | PRN
  Administered 2013-06-04: 25 ug via INTRAVENOUS
  Administered 2013-06-04 (×2): 50 ug via INTRAVENOUS

## 2013-06-04 MED ORDER — IOHEXOL 300 MG/ML  SOLN
10.0000 mL | Freq: Once | INTRAMUSCULAR | Status: AC | PRN
  Administered 2013-06-04: 1 mL

## 2013-06-04 MED ORDER — ONDANSETRON HCL 4 MG/2ML IJ SOLN
4.0000 mg | Freq: Once | INTRAMUSCULAR | Status: AC
  Administered 2013-06-04: 4 mg via INTRAVENOUS

## 2013-06-04 MED ORDER — MIDAZOLAM HCL 2 MG/2ML IJ SOLN
INTRAMUSCULAR | Status: AC | PRN
  Administered 2013-06-04: 1 mg via INTRAVENOUS
  Administered 2013-06-04: 0.5 mg via INTRAVENOUS
  Administered 2013-06-04: 1 mg via INTRAVENOUS

## 2013-06-04 MED ORDER — LACTATED RINGERS IV SOLN: INTRAVENOUS | Status: DC

## 2013-06-04 MED ORDER — CEFAZOLIN SODIUM-DEXTROSE 2-3 GM-% IV SOLR
2.0000 g | INTRAVENOUS | Status: AC
  Administered 2013-06-04: 2 g via INTRAVENOUS

## 2013-06-04 MED ORDER — METOPROLOL TARTRATE 12.5 MG HALF TABLET
12.5000 mg | ORAL_TABLET | Freq: Once | ORAL | Status: AC
  Administered 2013-06-04: 12.5 mg via ORAL
  Filled 2013-06-04: qty 1

## 2013-06-04 MED ORDER — METOPROLOL TARTRATE 12.5 MG HALF TABLET
12.5000 mg | ORAL_TABLET | Freq: Every morning | ORAL | Status: DC
  Administered 2013-06-04 – 2013-06-07 (×3): 12.5 mg via ORAL
  Filled 2013-06-04 (×4): qty 1

## 2013-06-04 MED ORDER — PROPOFOL 10 MG/ML IV BOLUS
INTRAVENOUS | Status: DC | PRN
  Administered 2013-06-04: 40 mg via INTRAVENOUS

## 2013-06-04 MED ORDER — LIDOCAINE HCL (CARDIAC) 20 MG/ML IV SOLN
INTRAVENOUS | Status: DC | PRN
  Administered 2013-06-04: 100 mg via INTRAVENOUS

## 2013-06-04 MED ORDER — CIPROFLOXACIN HCL 500 MG PO TABS
500.0000 mg | ORAL_TABLET | Freq: Two times a day (BID) | ORAL | Status: DC
  Administered 2013-06-04 – 2013-06-07 (×6): 500 mg via ORAL
  Filled 2013-06-04 (×8): qty 1

## 2013-06-04 SURGICAL SUPPLY — 45 items
BAG URINE DRAINAGE (UROLOGICAL SUPPLIES) ×2 IMPLANT
BASKET ZERO TIP NITINOL 2.4FR (BASKET) IMPLANT
BENZOIN TINCTURE PRP APPL 2/3 (GAUZE/BANDAGES/DRESSINGS) ×4 IMPLANT
BLADE SURG 15 STRL LF DISP TIS (BLADE) ×1 IMPLANT
BLADE SURG 15 STRL SS (BLADE) ×1
CARTRIDGE STONEBREAK CO2 KIDNE (ELECTROSURGICAL) ×2 IMPLANT
CATH FOLEY 2W COUNCIL 20FR 5CC (CATHETERS) IMPLANT
CATH ROBINSON RED A/P 20FR (CATHETERS) IMPLANT
CATH ULTRATHANE 14FR (STENTS) ×2 IMPLANT
CATH X-FORCE N30 NEPHROSTOMY (TUBING) ×2 IMPLANT
CLOTH BEACON ORANGE TIMEOUT ST (SAFETY) ×2 IMPLANT
COVER SURGICAL LIGHT HANDLE (MISCELLANEOUS) ×2 IMPLANT
DRAPE C-ARM 42X120 X-RAY (DRAPES) ×2 IMPLANT
DRAPE CAMERA CLOSED 9X96 (DRAPES) ×2 IMPLANT
DRAPE LINGEMAN PERC (DRAPES) ×2 IMPLANT
DRAPE SURG IRRIG POUCH 19X23 (DRAPES) ×2 IMPLANT
DRSG TEGADERM 8X12 (GAUZE/BANDAGES/DRESSINGS) IMPLANT
GLOVE BIOGEL M 8.0 STRL (GLOVE) ×2 IMPLANT
GOWN STRL REIN XL XLG (GOWN DISPOSABLE) ×2 IMPLANT
GUIDEWIRE AMPLAZ .035X145 (WIRE) ×2 IMPLANT
KIT BASIN OR (CUSTOM PROCEDURE TRAY) ×2 IMPLANT
LASER FIBER DISP (UROLOGICAL SUPPLIES) IMPLANT
LASER FIBER DISP 1000U (UROLOGICAL SUPPLIES) IMPLANT
MANIFOLD NEPTUNE II (INSTRUMENTS) ×2 IMPLANT
NS IRRIG 1000ML POUR BTL (IV SOLUTION) IMPLANT
PACK BASIC VI WITH GOWN DISP (CUSTOM PROCEDURE TRAY) ×2 IMPLANT
PAD ABD 7.5X8 STRL (GAUZE/BANDAGES/DRESSINGS) ×4 IMPLANT
PROBE KIDNEY STONEBRKR 2.0X425 (ELECTROSURGICAL) ×2 IMPLANT
PROBE LITHOCLAST ULTRA 3.8X403 (UROLOGICAL SUPPLIES) IMPLANT
PROBE PNEUMATIC 1.0MMX570MM (UROLOGICAL SUPPLIES) ×2 IMPLANT
SET IRRIG Y TYPE TUR BLADDER L (SET/KITS/TRAYS/PACK) ×2 IMPLANT
SET WARMING FLUID IRRIGATION (MISCELLANEOUS) IMPLANT
SHEATH PEELAWAY SET 9 (SHEATH) ×2 IMPLANT
SPONGE GAUZE 4X4 12PLY (GAUZE/BANDAGES/DRESSINGS) ×2 IMPLANT
SPONGE LAP 4X18 X RAY DECT (DISPOSABLE) ×2 IMPLANT
STONE CATCHER W/TUBE ADAPTER (UROLOGICAL SUPPLIES) ×2 IMPLANT
SUT SILK 2 0 30  PSL (SUTURE) ×1
SUT SILK 2 0 30 PSL (SUTURE) ×1 IMPLANT
SYR 20CC LL (SYRINGE) ×4 IMPLANT
SYRINGE 10CC LL (SYRINGE) ×2 IMPLANT
TRAY FOLEY BAG SILVER LF 14FR (CATHETERS) ×2 IMPLANT
TRAY FOLEY CATH 14FRSI W/METER (CATHETERS) ×2 IMPLANT
TUBE CONNECTING VINYL 14FR 30C (MISCELLANEOUS) ×2 IMPLANT
TUBING CONNECTING 10 (TUBING) ×6 IMPLANT
WATER STERILE IRR 1500ML POUR (IV SOLUTION) IMPLANT

## 2013-06-04 NOTE — H&P (Signed)
  H&P  Chief Complaint: Kidney stone  History of Present Illness: Penny [name] is a 79 y.o. year old female with a symptomatic, enlarging right upper ureteral stone. 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     Past Medical History  Diagnosis Date  . Long-term (current) use of anticoagulants   . Chronic shoulder pain   . Heart palpitations     rare  . Memory problem   . Depression   . Atrial fibrillation   . Hyperlipidemia   . Dyslipidemia   . Seasonal allergies   . Anxiety   . History of kidney stones     Past Surgical History  Procedure Laterality Date  . Partial hysterectomy    . Ureteral stent placement      bilateral / for stenosis  . Removal of growth  2007    growth under kidney removed and possibly kindey stones removed  . Knee arthroscopy Right   . Total shoulder arthroplasty Right 2009    Home Medications:  No prescriptions prior to admission    Allergies:  Allergies  Allergen Reactions  . Zocor (Simvastatin)   . Sulfa Antibiotics Rash    Family History  Problem Relation Age of Onset  . Heart disease Mother     Social History:  reports that she quit smoking about 40 years ago. Her smoking use included Cigarettes. She smoked 0.00 packs per day for 5 years. She has never used smokeless tobacco. She reports that she does not drink alcohol or use illicit drugs.  ROS: A complete review of systems was performed.  All systems are negative except for pertinent findings as noted.  Physical Exam:  Vital signs in last 24 hours:   General:  Alert and oriented, No acute distress HEENT: Normocephalic, atraumatic Neck: No JVD or lymphadenopathy Cardiovascular: Regular rate and rhythm Lungs: Clear bilaterally Abdomen: Soft, nontender, nondistended, no abdominal masses Back: No CVA tenderness Extremities: No edema Neurologic: Grossly intact. She does have  memory loss/cognitive dysfunction.  Laboratory Data:  No results found for this or any previous visit (from the past 24 hour(s)). No results found for this or any previous visit (from the past 240 hour(s)). Creatinine: No results found for this basename: CREATININE,  in the last 168 hours  Radiologic Imaging: No results found.  Impression/Assessment:  19 mm stone in (lower duplicated) right upper ureter.  Plan:  Percutaneous right nephrolithotomy  Chelsea Aus 06/04/2013, 6:35 AM  Bertram Millard. Torry Istre MD

## 2013-06-04 NOTE — Anesthesia Preprocedure Evaluation (Addendum)
Anesthesia Evaluation  Patient identified by MRN, date of birth, ID band Patient awake    Reviewed: Allergy & Precautions, H&P , NPO status , Patient's Chart, lab work & pertinent test results  Airway Mallampati: II TM Distance: >3 FB Neck ROM: Full    Dental  (+) Teeth Intact and Dental Advisory Given   Pulmonary neg pulmonary ROS,  breath sounds clear to auscultation        Cardiovascular hypertension, Pt. on home beta blockers negative cardio ROS  + dysrhythmias (Chronic atrial fibrillation) Atrial Fibrillation Rhythm:Regular Rate:Normal     Neuro/Psych Anxiety Depression Hx of syncope; dementia with memory issues negative neurological ROS  negative psych ROS   GI/Hepatic negative GI ROS, Neg liver ROS,   Endo/Other  negative endocrine ROS  Renal/GU negative Renal ROSRight renal calculus  negative genitourinary   Musculoskeletal negative musculoskeletal ROS (+)   Abdominal   Peds negative pediatric ROS (+)  Hematology negative hematology ROS (+)   Anesthesia Other Findings   Reproductive/Obstetrics                         Anesthesia Physical Anesthesia Plan  ASA: III  Anesthesia Plan: General   Post-op Pain Management:    Induction: Intravenous  Airway Management Planned: Oral ETT  Additional Equipment:   Intra-op Plan:   Post-operative Plan: Extubation in OR  Informed Consent: I have reviewed the patients History and Physical, chart, labs and discussed the procedure including the risks, benefits and alternatives for the proposed anesthesia with the patient or authorized representative who has indicated his/her understanding and acceptance.   Dental advisory given  Plan Discussed with: CRNA  Anesthesia Plan Comments:         Anesthesia Quick Evaluation

## 2013-06-04 NOTE — Op Note (Signed)
Preoperative diagnosis: 19 mm right renal stone  Postoperative diagnosis: Same   Procedure: Percutaneous nephrolithotomy of 19 mm right renal stone    Surgeon: Bertram Millard. Ryosuke Ericksen, M.D.   Anesthesia: Gen.   Complications: None  Specimen(s): Stone fragments to family  Drain(s): 14 French Cook pigtail catheter, 18 French Foley catheter  Indications: 79 year-old female with a symptomatic 19 mm right UPJ stone. We have been following this, but it has shown progressive growth and now has hematuria with her anticoagulant. She presents for percutaneous management of this.    Technique and findings: The patient had Dr. Rachel Moulds placed the percutaneous access in her right kidney in the radiology suite. She was identified and properly marked in the holding area and received preoperative IV antibiotics. She was taken to the operating room where general anesthetic was administered with endotracheal apparatus. Foley catheter was placed, and then she was placed in the prone position on the operating table. All extremities were properly tucked and padded. The arms were tucked by her size rather than extended due to prior right shoulder surgery. Her right flank was prepped and draped. Proper timeout was performed.  I then advanced a guidewire through the Kumpe catheter which had been placed by Dr. Elmon Kirschner. The guidewire was advanced into her bladder fluoroscopically. I placed a second guidewire alongside this using a peel-away access sheath. I then used the working guidewire to dilate the patient's nephrostomy tract using a NephroMax balloon with fluoroscopic guidance. I used a 24 [place] access sheath with was placed fluoroscopically in the pelvis to access the stone. The stone was seen, but clipped up in the upper pole. I then grasped and brought up to the pelvis, where it was fragmented with the Stonebraker, and all fragments extracted. Even the tiny fragments were easily removed. Careful inspection with both  the scope and fluoroscopically revealed no evidence stone fragments remaining. I then removed the nephroscope, and over top of the working guidewire placed a 14 French Cook pigtail catheter. The guidewire was removed. Fluoroscopy was used to assist with placing the tip of the pigtail catheter, which was and curled with the string and locked. Nephrogram was then performed. This revealed no significant extravasation, as well as ready antegrade movement of the contrast down the ureter. At this point the sheath in the safety guidewire were removed. 2-0 silk was used to close the skin with vertical mattress sutures. The suture was also used to secure the [place] catheter to the skin.  Dressing the [facility] catheter was then placed to dependent drainage. The patient tolerated procedure well. Estimated blood loss was under 100 cc. He was taken to the PACU in stable condition. s were then placed.

## 2013-06-04 NOTE — Transfer of Care (Signed)
Immediate Anesthesia Transfer of Care Note  Patient: Penny [name]  Procedure(s) Performed: Procedure(s): NEPHROLITHOTOMY PERCUTANEOUS (Right)  Patient Location: PACU  Anesthesia Type:General  Level of Consciousness: sedated  Airway & Oxygen Therapy: Patient Spontanous Breathing and Patient connected to face mask oxygen  Post-op Assessment: Report given to PACU RN and Post -op Vital signs reviewed and stable  Post vital signs: Reviewed and stable  Complications: No apparent anesthesia complications

## 2013-06-04 NOTE — H&P (Signed)
Penny [name] is an 79 y.o. female.   Chief Complaint: right kidney stone HPI: Patient with history of hematuria and enlarging right UPJ stone in a duplicated right ureter presents today for right nephroureteral cath placement prior to nephrolithotomy.  Past Medical History  Diagnosis Date  . Long-term (current) use of anticoagulants   . Chronic shoulder pain   . Heart palpitations     rare  . Memory problem   . Depression   . Atrial fibrillation   . Hyperlipidemia   . Dyslipidemia   . Seasonal allergies   . Anxiety   . History of kidney stones     Past Surgical History  Procedure Laterality Date  . Partial hysterectomy    . Ureteral stent placement      bilateral / for stenosis  . Removal of growth  2007    growth under kidney removed and possibly kindey stones removed  . Knee arthroscopy Right   . Total shoulder arthroplasty Right 2009    Family History  Problem Relation Age of Onset  . Heart disease Mother    Social History:  reports that she quit smoking about 40 years ago. Her smoking use included Cigarettes. She smoked 0.00 packs per day for 5 years. She has never used smokeless tobacco. She reports that she does not drink alcohol or use illicit drugs.  Allergies:  Allergies  Allergen Reactions  . Zocor (Simvastatin)   . Sulfa Antibiotics Rash    Medications Prior to Admission  Medication Sig Dispense Refill  . diazepam (VALIUM) 5 MG tablet Take 2.5-5 mg by mouth every 12 (twelve) hours as needed for anxiety.      . digoxin (LANOXIN) 0.125 MG tablet Take 0.125 mg by mouth 3 (three) times a week. 1 pill on Monday, Wednesday, and Friday      . donepezil (ARICEPT) 10 MG tablet Take 10 mg by mouth at bedtime.       . metoprolol tartrate (LOPRESSOR) 25 MG tablet Take 12.5 mg by mouth every morning.      . rosuvastatin (CRESTOR) 5 MG tablet Take 5 mg by mouth at bedtime.      . Ascorbic Acid (VITAMIN C) 500 MG tablet Take 500 mg by mouth daily.        .  Cholecalciferol (VITAMIN D-3) 5000 UNITS TABS Take 5,000 Units by mouth daily.      . meclizine (ANTIVERT) 25 MG tablet Take 25 mg by mouth 3 (three) times daily as needed for dizziness.       . Multiple Vitamin (MULTIVITAMIN WITH MINERALS) TABS Take 1 tablet by mouth daily.      . Omega-3 Fatty Acids (FISH OIL PO) Take 1 capsule by mouth daily.       . Rivaroxaban (XARELTO) 20 MG TABS Take 20 mg by mouth daily with supper.        Results for orders placed during the hospital encounter of 06/04/13 (from the past 48 hour(s))  PROTIME-INR     Status: None   Collection Time    06/04/13  7:08 AM      Result Value Range   Prothrombin Time 14.4  11.6 - 15.2 seconds   INR 1.14  0.00 - 1.49   No results found.  Review of Systems  Constitutional: Negative for fever and chills.  Respiratory: Negative for cough and shortness of breath.   Cardiovascular: Negative for chest pain.  Gastrointestinal: Negative for nausea, vomiting and abdominal pain.  Genitourinary: Positive for  hematuria and flank pain.  Musculoskeletal: Positive for back pain.  Neurological: Negative for headaches.       Mild cognitive dysfunction  Endo/Heme/Allergies: Does not bruise/bleed easily.    Blood pressure 109/63, pulse 80, temperature 97.6 F (36.4 C), temperature source Oral, resp. rate 16, height 5\' 5"  (1.651 m), weight 120 lb (54.432 kg), SpO2 100.00%. Physical Exam  Constitutional: She is oriented to person, place, and time. She appears well-developed and well-nourished.  Cardiovascular:  irreg ireg  Respiratory: Effort normal and breath sounds normal.  GI: Soft. Bowel sounds are normal. There is no tenderness.  Mild rt CVA tenderness  Musculoskeletal: Normal range of motion. She exhibits no edema.  Neurological: She is alert and oriented to person, place, and time.     Assessment/Plan Pt with hx hematuria, enlarging rt UPJ stone in duplicated rt ureter. Plan is for rt nephroureteral cath placement prior  to nephrolithotomy. Details/risks of procedure d/w pt/family with their understanding and consent.  Kayleann Mccaffery,D KEVIN 06/04/2013, 8:15 AM

## 2013-06-04 NOTE — Anesthesia Postprocedure Evaluation (Signed)
Anesthesia Post Note  Patient: Penny [name]  Procedure(s) Performed: Procedure(s) (LRB): NEPHROLITHOTOMY PERCUTANEOUS (Right)  Anesthesia type: General  Patient location: PACU  Post pain: Pain level controlled  Post assessment: Post-op Vital signs reviewed  Last Vitals:  Filed Vitals:   06/04/13 1445  BP: 114/67  Pulse: 69  Temp: 36.4 C  Resp: 15    Post vital signs: Reviewed  Level of consciousness: sedated  Complications: No apparent anesthesia complications

## 2013-06-04 NOTE — Procedures (Signed)
Successful placement of right sided 5 Fr catheter for PNL access.  Tip of catheter is within the urinary bladder.  No immediate complications.    

## 2013-06-05 ENCOUNTER — Encounter (HOSPITAL_COMMUNITY): Payer: Self-pay | Admitting: Urology

## 2013-06-05 LAB — HEMOGLOBIN AND HEMATOCRIT, BLOOD: HCT: 33.2 % — ABNORMAL LOW (ref 36.0–46.0)

## 2013-06-05 MED ORDER — CIPROFLOXACIN HCL 500 MG PO TABS: 500.0000 mg | ORAL_TABLET | Freq: Two times a day (BID) | ORAL | Status: DC

## 2013-06-05 MED ORDER — HYDROCODONE-ACETAMINOPHEN 5-325 MG PO TABS: 1.0000 | ORAL_TABLET | ORAL | Status: DC | PRN

## 2013-06-05 NOTE — Progress Notes (Signed)
Assumed care of patient. Agree with previous RN assessment. Will continue to monitor. Vincenta Steffey L RN 

## 2013-06-05 NOTE — Progress Notes (Addendum)
Patient c/o pain in the right lower flank.  RN noticed upon an assessment of the right flank that there was a 10cm x 12 cm area of swollen tissue without discoloration. Patient did not want any pain medication at this time but the patient was repositioned in the bed and emotional support was offered to the patient. Will continue to monitor patient.

## 2013-06-05 NOTE — Progress Notes (Signed)
1 Day Post-Op Subjective: Patient reports that she is not having significant pain.  Objective: Vital signs in last 24 hours: Temp:  [93.9 F (34.4 C)-98.3 F (36.8 C)] 98.1 F (36.7 C) (07/18 0202) Pulse Rate:  [59-88] 77 (07/18 0202) Resp:  [4-16] 16 (07/18 0202) BP: (99-135)/(51-74) 113/58 mmHg (07/18 0202) SpO2:  [95 %-100 %] 97 % (07/18 0202) Weight:  [54.432 kg (120 lb)] 54.432 kg (120 lb) (07/17 1507)  Intake/Output from previous day: 07/17 0701 - 07/18 0700 In: 2271.3 [P.O.:120; I.V.:2131.3] Out: 655 [Urine:655] Intake/Output this shift: Total I/O In: 440 [P.O.:120; I.V.:300; Other:20] Out: 425 [Urine:425]  Physical Exam:  Constitutional: Vital signs reviewed. WD WN in NAD   Eyes: PERRL, No scleral icterus.   Cardiovascular: RRR Pulmonary/Chest: Normal effort Abdominal: Soft. Non-tender, non-distended, bowel sounds are normal, no masses, organomegaly, or guarding present.  Genitourinary: Extremities: No cyanosis or edema   Urine in the catheter bag is slightly pink. Her nephrostomy bag is red, but without clots. Lab Results:  Recent Labs  06/05/13 0510  HGB 11.0*  HCT 33.2*   BMET  Recent Labs  06/04/13 0810  NA 140  K 3.4*  CL 107  CO2 24  GLUCOSE 79  BUN 18  CREATININE 0.72  CALCIUM 9.3    Recent Labs  06/04/13 0708  INR 1.14   No results found for this basename: LABURIN,  in the last 72 hours Results for orders placed during the hospital encounter of 01/12/13  CULTURE, BLOOD (ROUTINE X 2)     Status: None   Collection Time    01/12/13 10:55 PM      Result Value Range Status   Specimen Description BLOOD RIGHT ARM   Final   Special Requests BOTTLES DRAWN AEROBIC AND ANAEROBIC 8CC   Final   Culture  Setup Time 01/13/2013 09:00   Final   Culture NO GROWTH 5 DAYS   Final   Report Status 01/19/2013 FINAL   Final  CULTURE, BLOOD (ROUTINE X 2)     Status: None   Collection Time    01/12/13 11:05 PM      Result Value Range Status   Specimen Description BLOOD LEFT ARM   Final   Special Requests BOTTLES DRAWN AEROBIC ONLY [facility]   Final   Culture  Setup Time 01/13/2013 09:01   Final   Culture NO GROWTH 5 DAYS   Final   Report Status 01/19/2013 FINAL   Final  URINE CULTURE     Status: None   Collection Time    01/12/13 11:27 PM      Result Value Range Status   Specimen Description URINE, CATHETERIZED   Final   Special Requests NONE   Final   Culture  Setup Time 01/13/2013 09:02   Final   Colony Count >=100,000 COLONIES/ML   Final   Culture KLEBSIELLA PNEUMONIAE   Final   Report Status 01/15/2013 FINAL   Final   Organism ID, Bacteria KLEBSIELLA PNEUMONIAE   Final  GRAM STAIN     Status: None   Collection Time    01/12/13 11:28 PM      Result Value Range Status   Specimen Description URINE, CATHETERIZED   Final   Special Requests NONE   Final   Gram Stain     Final   Value: CYTOSPIN SAMPLE     WBC PRESENT,BOTH PMN AND MONONUCLEAR     GRAM NEGATIVE RODS   Report Status 01/13/2013 FINAL   Final  Studies/Results: Ir US Guide Bx Asp/drain  06/04/2013   *RADIOLOGY REPORT*  Indication: Renal stones, access for right-sided percutaneous nephrolithotomy.  1.  FLUOROSCOPIC GUIDANCE FOR PUNCTURE OF THE RIGHT RENAL COLLECTING SYSTEM. 2. RIGHT PERCUTANEOUS NEPHROSTOMY TUBE PLACEMENT  Comparison: CT of the abdomen and pelvis - 04/06/2013  Intravenous medications: Fentanyl 125 mcg IV; Versed 2.5 mg IV; Ciprofloxacin 400 mg IV; The antibiotic was administered in an appropriate time frame prior to skin puncture.  Total Moderate Sedation Time: 35 minutes.  Contrast: 50 mL Omnipaque-300 (administered into the collecting system)  Fluoroscopy Time: 12 minutes, 6 seconds.  Complications: None immediate  Procedure:  Informed written consent was obtained from the patient after a discussion of the risks, benefits, and alternatives to treatment. The right flank region was prepped with Betadine in a sterile fashion, and a sterile drape was  applied covering the operative field.  A sterile gown and sterile gloves were used for the procedure.  A timeout was performed prior to the initiation of the procedure.  A pre procedural spot fluoroscopic image was obtained of the upper abdomen.  Ultrasound scanning performed of the kidney was negative for significant hydronephrosis.  As such, the stone overlying the expected location of the inferior aspect of the duplicated caudal right-sided renal moiety was targeted fluoroscopically with a 21 gauge Chiba needle.  Access to the superior aspect of the duplicated right-sided ureter was confirmed with advancement of a nine tract wire to the level of the urinary bladder. As such, the needle was exchanged for the inner 3 French catheter from an Accustick set and contrast injection confirmed access.   A small amount of air was injected into the collecting system to help delineate a posterior calyx.  A posterior inferior calyx was targeted with a second 21 gauge Chiba needle.  Access to the calyx was confirmed with advancement of a Nitrex wire into the collecting system.  An Accustick set was utilized to dilate the tract and was subsequently exchanged for a Kumpe catheter over a Bentson wire. The Kumpe catheter was advanced down the ureter and into the urinary bladder.  Postprocedural spot radiographs were obtained in various obliquities and the catheter was sutured to the skin.  The catheter was capped and a dressing was placed.  The patient tolerated the procedure well without immediate postprocedural complication.  Findings:  Pre procedural spot radiographic images demonstrates grossly unchanged appearance of the approximately 1.4 x 0.8 cm stone overlying the inferior aspect of the known duplicated caudal right- sided renal moiety.  Ultrasound scanning was negative for significant hydronephrosis and as such, the stone was targeted fluoroscopically allowing access to the collecting system.  With the collecting system  now opacified, a posterior inferior calyx was targeted fluoroscopically ultimately allowing placement of a Kumpe catheter through this calyx with tip terminating within the urinary bladder.  IMPRESSION:  1.  Successful fluoroscopic guided placement of a right sided 5 [place] Kumpe catheter to the level of the urinary bladder to be utilized during impending nephrolithotomy.  2. 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  Above findings discussed with Dr. Retta Diones at the time of procedure completion.   Original Report Authenticated By: Tacey Ruiz, MD   Dg C-arm 1-60 Min-no Report  06/04/2013   CLINICAL DATA: kidney stones   C-ARM 1-60 MINUTES  Fluoroscopy was utilized by the requesting physician.  No radiographic  interpretation.    Ir [place] Abts Cath Perc Right  06/04/2013   *RADIOLOGY REPORT*  Indication: Renal stones, access for right-sided percutaneous nephrolithotomy.  1.  FLUOROSCOPIC GUIDANCE FOR PUNCTURE OF THE RIGHT RENAL COLLECTING SYSTEM. 2. RIGHT PERCUTANEOUS NEPHROSTOMY TUBE PLACEMENT  Comparison: CT of the abdomen and pelvis - 04/06/2013  Intravenous medications: Fentanyl 125 mcg IV; Versed 2.5 mg IV; Ciprofloxacin 400 mg IV; The antibiotic was administered in an appropriate time frame prior to skin puncture.  Total Moderate Sedation Time: 35 minutes.  Contrast: 50 mL Omnipaque-300 (administered into the collecting system)  Fluoroscopy Time: 12 minutes, 6 seconds.  Complications: None immediate  Procedure:  Informed written consent was obtained from the patient after a discussion of the risks, benefits, and alternatives to treatment. The right flank region was prepped with Betadine in a sterile fashion, and a sterile drape was applied covering the operative field.  A sterile gown and sterile gloves were used for the procedure.  A timeout was performed prior to the initiation of the procedure.  A pre procedural spot fluoroscopic image  was obtained of the upper abdomen.  Ultrasound scanning performed of the kidney was negative for significant hydronephrosis.  As such, the stone overlying the expected location of the inferior aspect of the duplicated caudal right-sided renal moiety was targeted fluoroscopically with a 21 gauge Chiba needle.  Access to the superior aspect of the duplicated right-sided ureter was confirmed with advancement of a nine tract wire to the level of the urinary bladder. As such, the needle was exchanged for the inner 3 French catheter from an Accustick set and contrast injection confirmed access.   A small amount of air was injected into the collecting system to help delineate a posterior calyx.  A posterior inferior calyx was targeted with a second 21 gauge Chiba needle.  Access to the calyx was confirmed with advancement of a Nitrex wire into the collecting system.  An Accustick set was utilized to dilate the tract and was subsequently exchanged for a Kumpe catheter over a Bentson wire. The Kumpe catheter was advanced down the ureter and into the urinary bladder.  Postprocedural spot radiographs were obtained in various obliquities and the catheter was sutured to the skin.  The catheter was capped and a dressing was placed.  The patient tolerated the procedure well without immediate postprocedural complication.  Findings:  Pre procedural spot radiographic images demonstrates grossly unchanged appearance of the approximately 1.4 x 0.8 cm stone overlying the inferior aspect of the known duplicated caudal right- sided renal moiety.  Ultrasound scanning was negative for significant hydronephrosis and as such, the stone was targeted fluoroscopically allowing access to the collecting system.  With the collecting system now opacified, a posterior inferior calyx was targeted fluoroscopically ultimately allowing placement of a Kumpe catheter through this calyx with tip terminating within the urinary bladder.  IMPRESSION:  1.   Successful fluoroscopic guided placement of a right sided 5 [place] Kumpe catheter to the level of the urinary bladder to be utilized during impending nephrolithotomy.  2. 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  Above findings discussed with Dr. Retta Diones at the time of procedure completion.   Original Report Authenticated By: Tacey Ruiz, MD    Assessment/Plan:   Postoperative day #1 right percutaneous nephrolithotomy. She seems to be doing well. Catheter was discontinued, her nephrostomy tube was clamped. I will possibly remove it later on today if she tolerates it. Possible discharge later.   LOS: 1 day   Armend Hochstatter  M 06/05/2013, 6:43 AM

## 2013-06-06 ENCOUNTER — Inpatient Hospital Stay (HOSPITAL_COMMUNITY): Payer: Medicare Other

## 2013-06-06 LAB — CBC
HCT: 35.6 % — ABNORMAL LOW (ref 36.0–46.0)
Hemoglobin: 11.6 g/dL — ABNORMAL LOW (ref 12.0–15.0)
MCH: 30.1 pg (ref 26.0–34.0)
MCHC: 32.6 g/dL (ref 30.0–36.0)
MCV: 92.2 fL (ref 78.0–100.0)
Platelets: 108 10*3/uL — ABNORMAL LOW (ref 150–400)
RBC: 3.86 MIL/uL — ABNORMAL LOW (ref 3.87–5.11)
RDW: 13.7 % (ref 11.5–15.5)
WBC: 8.2 10*3/uL (ref 4.0–10.5)

## 2013-06-06 MED ORDER — MORPHINE BOLUS VIA INFUSION: 2.0000 mg | INTRAVENOUS | Status: DC | PRN

## 2013-06-06 NOTE — Progress Notes (Signed)
The painful right flank was a little erythemic. The area did not feel as dense this am when the patient's skin was palpated. Will continue to monitor patient.

## 2013-06-06 NOTE — Progress Notes (Signed)
Pt voided "small amount" that did not get collected in the collection hat.  Bladder scanned post void revealed high amount of .  Pt denies urge to void at this moment.  States she has been voiding small amounts every 30 to 60 minutes and feels frequent urge to void.  RE-instructed to attempt to collect urine in hat.  Hat is in commode.

## 2013-06-06 NOTE — Progress Notes (Signed)
Patient ID: Penny [name], female   DOB: 1929-11-04, 79 y.o.   MRN: [id number] 2 Days Post-Op Subjective: Patient is complaining of pain in her right flank region. It is moderately severe. It is not associated with any abdominal discomfort. She is not nauseated.  Objective: Vital signs in last 24 hours: Temp:  [98 F (36.7 C)-98.3 F (36.8 C)] 98 F (36.7 C) (07/18 2035) Pulse Rate:  [73-86] 86 (07/18 2035) Resp:  [16-18] 18 (07/18 2035) BP: (101-130)/(55-64) 130/64 mmHg (07/18 2035) SpO2:  [95 %-100 %] 100 % (07/18 2035)  Intake/Output from previous day: 07/18 0701 - 07/19 0700 In: 1680 [P.O.:480; I.V.:1200] Out: 250 [Urine:250] Intake/Output this shift:    Past Medical History  Diagnosis Date  . Long-term (current) use of anticoagulants   . Chronic shoulder pain   . Heart palpitations     rare  . Memory problem   . Depression   . Atrial fibrillation   . Hyperlipidemia   . Dyslipidemia   . Seasonal allergies   . Anxiety   . History of kidney stones    Current Facility-Administered Medications  Medication Dose Route Frequency Provider Last Rate Last Dose  . ciprofloxacin (CIPRO) tablet 500 mg  500 mg Oral BID Marcine Matar, MD   500 mg at 06/05/13 2031  . dextrose 5 %-0.45 % sodium chloride infusion   Intravenous Continuous Marcine Matar, MD 75 mL/hr at 06/06/13 0601    . diazepam (VALIUM) tablet 2.5-5 mg  2.5-5 mg Oral Q12H PRN Marcine Matar, MD   5 mg at 06/05/13 2139  . digoxin (LANOXIN) tablet 0.125 mg  0.125 mg Oral 3 times weekly Marcine Matar, MD   0.125 mg at 06/05/13 1191  . docusate sodium (COLACE) capsule 100 mg  100 mg Oral BID Marcine Matar, MD   100 mg at 06/05/13 2138  . donepezil (ARICEPT) tablet 10 mg  10 mg Oral QHS Marcine Matar, MD   10 mg at 06/05/13 2139  . meclizine (ANTIVERT) tablet 25 mg  25 mg Oral TID PRN Marcine Matar, MD      . metoprolol tartrate (LOPRESSOR) tablet 12.5 mg  12.5 mg Oral q morning - 10a Marcine Matar, MD   12.5 mg at 06/04/13 2234  . morphine 2 MG/ML injection 1-2 mg  1-2 mg Intravenous Q2H PRN Marcine Matar, MD   2 mg at 06/06/13 0601  . ondansetron (ZOFRAN) injection 4 mg  4 mg Intravenous Q6H PRN Marcine Matar, MD   4 mg at 06/04/13 2234  . oxyCODONE (Oxy IR/ROXICODONE) immediate release tablet 5 mg  5 mg Oral Q4H PRN Marcine Matar, MD   5 mg at 06/06/13 0256    Physical Exam:  General: Patient is in mild to moderate distress Lungs: Normal respiratory effort, chest expands symmetrically. GI: The abdomen is soft and nontender without mass. Right flank: Her nephrostomy site has a bag in place that has scant drainage. There appears to be tenderness and some discoloration in the dependent portion of the flank laterally consistent with possible hematoma/ecchymoses.    Lab Results:  Recent Labs  06/05/13 0510  HGB 11.0*  HCT 33.2*   BMET  Recent Labs  06/04/13 0810  NA 140  K 3.4*  CL 107  CO2 24  GLUCOSE 79  BUN 18  CREATININE 0.72  CALCIUM 9.3    Recent Labs  06/04/13 0708  INR 1.14   No results found for this basename: LABURIN,  in the last 72 hours Results  for orders placed during the hospital encounter of 01/12/13  CULTURE, BLOOD (ROUTINE X 2)     Status: None   Collection Time    01/12/13 10:55 PM      Result Value Range Status   Specimen Description BLOOD RIGHT ARM   Final   Special Requests BOTTLES DRAWN AEROBIC AND ANAEROBIC 8CC   Final   Culture  Setup Time 01/13/2013 09:00   Final   Culture NO GROWTH 5 DAYS   Final   Report Status 01/19/2013 FINAL   Final  CULTURE, BLOOD (ROUTINE X 2)     Status: None   Collection Time    01/12/13 11:05 PM      Result Value Range Status   Specimen Description BLOOD LEFT ARM   Final   Special Requests BOTTLES DRAWN AEROBIC ONLY [facility]   Final   Culture  Setup Time 01/13/2013 09:01   Final   Culture NO GROWTH 5 DAYS   Final   Report Status 01/19/2013 FINAL   Final  URINE CULTURE     Status:  None   Collection Time    01/12/13 11:27 PM      Result Value Range Status   Specimen Description URINE, CATHETERIZED   Final   Special Requests NONE   Final   Culture  Setup Time 01/13/2013 09:02   Final   Colony Count >=100,000 COLONIES/ML   Final   Culture KLEBSIELLA PNEUMONIAE   Final   Report Status 01/15/2013 FINAL   Final   Organism ID, Bacteria KLEBSIELLA PNEUMONIAE   Final  GRAM STAIN     Status: None   Collection Time    01/12/13 11:28 PM      Result Value Range Status   Specimen Description URINE, CATHETERIZED   Final   Special Requests NONE   Final   Gram Stain     Final   Value: CYTOSPIN SAMPLE     WBC PRESENT,BOTH PMN AND MONONUCLEAR     GRAM NEGATIVE RODS   Report Status 01/13/2013 FINAL   Final    Studies/Results: @RISRSLT24 @  Assessment/Plan: She has developed worsened right flank pain after her nephrostomy tube was removed yesterday. She does have what appears to be early ecchymoses involving her right flank as well. Her blood pressure is down slightly but she is not tachycardic. Her urine is bloody without clots. I am going to check a CBC and obtain a CT scan of her abdomen. She will not be going home today. This daughter today and gave her an update on her condition and my plan.  1. CBC. 2. CT scan of the abdomen without contrast. 3. She will be discharged today.  Kellina Dreese C 06/06/2013, 6:01 AM

## 2013-06-07 NOTE — Discharge Summary (Signed)
Physician Discharge Summary  Patient ID: CHAKARA BOGNAR MRN: [id number] DOB/AGE: Jul 11, 1929 79 y.o.  Admit date: 06/04/2013 Discharge date: 06/07/2013  Admission Diagnoses: Right renal calculus  Discharge Diagnoses:  Right renal calculus   Discharged Condition: good  Hospital Course: She was admitted and underwent a right percutaneous nephrolithotomy. The following day her nephrostomy tube was clamped and then removed. She did have leakage from her nephrostomy site and therefore a bag was applied. The leakage diminished over her hospital course. While in the hospital she was found to have some discoloration of her flank and was having increased flank pain so I checked a CBC which revealed no significant fall in her hemoglobin and no elevation of her white blood cell count as well as a CT scan to check for a perinephric hematoma that there was no significant fluid collection in the perinephric space although there did appear to be some edema fluid and possibly some blood in her flank region. I noted on her CT that her duplicated collecting system on the right-hand side did reveal some dilatation but I reviewed her antegrade nephrostogram images from the nephrostomy tube placement procedure and it appears she has some degree of chronic dilatation of the ureters. Her pain resolved after 24 hours and she was therefore felt ready for discharge.  Significant Diagnostic Studies: CT scan as above  Treatments: Right percutaneous nephrolithotomy  Discharge Exam: Blood pressure 118/68, pulse 97, temperature 98.4 F (36.9 C), temperature source Oral, resp. rate 20, height 5\' 5"  (1.651 m), weight 54.432 kg (120 lb), SpO2 98.00%. She was noted to be alert and cooperative and in no distress. Her abdomen was soft and nontender. The flank revealed the area of discoloration had not progressed over a 24-hour period. There is no sign of infection.  Disposition: 01-Home or Self Care  Discharge Orders   Future  Appointments Provider Department Dept Phone   06/24/2013 2:45 PM Cassell Clement, MD [facility] Main Office [place]) [phone]   Future Orders Complete By Expires     Discharge patient  As directed         Medication List    STOP taking these medications       XARELTO 20 MG Tabs  Generic drug:  Rivaroxaban      TAKE these medications       ciprofloxacin 250 MG tablet  Commonly known as:  CIPRO  Take 1 tablet (250 mg total) by mouth 2 (two) times daily.     ciprofloxacin 500 MG tablet  Commonly known as:  CIPRO  Take 1 tablet (500 mg total) by mouth 2 (two) times daily.     diazepam 5 MG tablet  Commonly known as:  VALIUM  Take 2.5-5 mg by mouth every 12 (twelve) hours as needed for anxiety.     digoxin 0.125 MG tablet  Commonly known as:  LANOXIN  Take 0.125 mg by mouth 3 (three) times a week. 1 pill on Monday, Wednesday, and Friday     donepezil 10 MG tablet  Commonly known as:  ARICEPT  Take 10 mg by mouth at bedtime.     FISH OIL PO  Take 1 capsule by mouth daily.     HYDROcodone-acetaminophen 5-325 MG per tablet  Commonly known as:  NORCO  Take 1-2 tablets by mouth every 4 (four) hours as needed for pain.     meclizine 25 MG tablet  Commonly known as:  ANTIVERT  Take 25 mg by mouth 3 (three) times daily as  needed for dizziness.     metoprolol tartrate 25 MG tablet  Commonly known as:  LOPRESSOR  Take 12.5 mg by mouth every morning.     multivitamin with minerals Tabs  Take 1 tablet by mouth daily.     rosuvastatin 5 MG tablet  Commonly known as:  CRESTOR  Take 5 mg by mouth at bedtime.     vitamin C 500 MG tablet  Commonly known as:  ASCORBIC ACID  Take 500 mg by mouth daily.     Vitamin D-3 5000 UNITS Tabs  Take 5,000 Units by mouth daily.           Follow-up Information   Follow up with Chelsea Aus, MD On 06/05/2013. (06/25/2013 @ 10:15 with Reginia Forts, NP)    Contact information:   [address] [place]  [place] [address] [phone]       Signed: [place]ett [place] 06/07/2013, 6:35 AM

## 2013-06-07 NOTE — Progress Notes (Signed)
Anxious after urgency & incontience of urine episode, pt cleaned & pt back in bed, started c/o mild chest pain, rated pain 3-4 out of 10, vss; EKG done, since not on telemetry monitor anymore, & has hx of A-fib, EKG done, but before EKG completed, pt stated that chest pain gone, & it wasn't that bad. EKG shows Afib w/RVR rate of 106 also has a bundle branch block,this is consistant with old findings, pt otherwise asymptomatic. Will con't to monitor gave po prn at 01:45am for mild discomfort, rechecked pt in an hour at 02:45am & pt was sleeping,& no distress noted, will con't to monitor.

## 2013-06-07 NOTE — Care Management Note (Signed)
    Page 1 of 1   06/07/2013     12:51:34 PM   CARE MANAGEMENT NOTE 06/07/2013  Patient:  Penny [name]   Account Number:  [id number]  Date Initiated:  06/05/2013  Documentation initiated by:  Ezekiel Ina  Subjective/Objective Assessment:   pt admitted for renal stone removal     Action/Plan:   from home   Anticipated DC Date:  06/07/2013   Anticipated DC Plan:  HOME/SELF CARE      DC Planning Services  CM consult      Choice offered to / List presented to:             Status of service:  Completed, signed off Medicare Important Message given?  NA - LOS <3 / Initial given by admissions (If response is "NO", the following Medicare IM given date fields will be blank) Date Medicare IM given:   Date Additional Medicare IM given:    Discharge Disposition:  HOME/SELF CARE  Per UR Regulation:  Reviewed for med. necessity/level of care/duration of stay  If discussed at Long Length of Stay Meetings, dates discussed:    Comments:  06/07/13 Brison Fiumara RN,BSN NCM [phone] D/C HOME NO ORDERS OR NEEDS.  06/05/13 MMcGibboney, RN, BSN Chart reviewed.

## 2013-06-24 ENCOUNTER — Encounter: Payer: Self-pay | Admitting: Cardiology

## 2013-06-24 ENCOUNTER — Ambulatory Visit (INDEPENDENT_AMBULATORY_CARE_PROVIDER_SITE_OTHER): Payer: Medicare Other | Admitting: Cardiology

## 2013-06-24 VITALS — BP 120/72 | HR 84 | Ht 65.5 in | Wt 118.6 lb

## 2013-06-24 DIAGNOSIS — R634 Abnormal weight loss: Secondary | ICD-10-CM

## 2013-06-24 DIAGNOSIS — I4891 Unspecified atrial fibrillation: Secondary | ICD-10-CM

## 2013-06-24 NOTE — Progress Notes (Signed)
Penny [name] Date of Birth:  1929-09-21 [facility] [address] [place], [place]  [address] [phone]         Fax   [phone]  History of Present Illness: This pleasant elderly woman is seen for a scheduled followup office visit. She has a history of chronic atrial fibrillation. She also has a past history of syncope and collapse which occurred at about the beginning of July 2013. She was found to have low blood pressure following that episode and her lisinopril was stopped. Subsequently the patient did well until 01/12/13 when she had another episode of syncope at a restaurant and was hospitalized briefly. She subsequently wore a 30 day event monitor. The event monitor did not show any cause for her syncope. She was in constant atrial fibrillation with a controlled ventricular response and no excessive bradycardia or tachycardia was seen. The patient reports that she has been feeling better. Her daughter who is a Engineer, civil (consulting) also states that she thinks that her mother has been doing better since we cut her back on her digoxin to just a small dose of 0.125 Monday Wednesday and Friday only.  Since we last saw her she was hospitalized with a kidney stone which was removed on 06/04/13.  She spent 3 nights in the hospital postop.  Her anticoagulation was held appropriately prior to the surgery.  She is no longer on Coumadin.  Her PCP switched her to Xarelto and I agree with that decision.  Current Outpatient Prescriptions  Medication Sig Dispense Refill  . Ascorbic Acid (VITAMIN C) 500 MG tablet Take 500 mg by mouth daily.        . Cholecalciferol (VITAMIN D-3) 5000 UNITS TABS Take 5,000 Units by mouth daily.      . diazepam (VALIUM) 5 MG tablet Take 2.5-5 mg by mouth every 12 (twelve) hours as needed for anxiety.      . digoxin (LANOXIN) 0.125 MG tablet Take 0.125 mg by mouth 3 (three) times a week. 1 pill on Monday, Wednesday, and Friday      . donepezil (ARICEPT) 10 MG tablet  Take 10 mg by mouth at bedtime.       . meclizine (ANTIVERT) 25 MG tablet Take 25 mg by mouth 3 (three) times daily as needed for dizziness.       . metoprolol tartrate (LOPRESSOR) 25 MG tablet Take 12.5 mg by mouth every morning.      . Multiple Vitamin (MULTIVITAMIN WITH MINERALS) TABS Take 1 tablet by mouth daily.      . Omega-3 Fatty Acids (FISH OIL PO) Take 1 capsule by mouth daily.       . rosuvastatin (CRESTOR) 5 MG tablet Take 5 mg by mouth at bedtime.      . Rivaroxaban (XARELTO) 20 MG TABS tablet Take 1 tablet (20 mg total) by mouth daily.  30 tablet     No current facility-administered medications for this visit.    Allergies  Allergen Reactions  . Zocor (Simvastatin)   . Sulfa Antibiotics Rash    Patient Active Problem List   Diagnosis Date Noted  . Atrial fibrillation 02/26/2011    Priority: High  . Malaise and fatigue 03/23/2011    Priority: Medium  . Bradycardia 01/14/2013  . UTI (urinary tract infection) 01/13/2013    Class: Acute  . Syncope and collapse 05/28/2012  . Weight loss, unintentional 03/04/2012  . Inguinal hernia 09/25/2011  . Dizziness - light-headed 06/27/2011  . Right bundle branch  block 03/23/2011  . Hypercholesterolemia 03/23/2011  . Long term current use of anticoagulant 02/26/2011    History  Smoking status  . Former Smoker -- 5 years  . Types: Cigarettes  . Quit date: 11/19/1972  Smokeless tobacco  . Never Used    History  Alcohol Use No    Family History  Problem Relation Age of Onset  . Heart disease Mother     Review of Systems: Constitutional: no fever chills diaphoresis or fatigue or change in weight.  Head and neck: no hearing loss, no epistaxis, no photophobia or visual disturbance. Respiratory: No cough, shortness of breath or wheezing. Cardiovascular: No chest pain peripheral edema, palpitations. Gastrointestinal: No abdominal distention, no abdominal pain, no change in bowel habits hematochezia or  melena. Genitourinary: No dysuria, no frequency, no urgency, no nocturia. Musculoskeletal:No arthralgias, no back pain, no gait disturbance or myalgias. Neurological: No dizziness, no headaches, no numbness, no seizures, no syncope, no weakness, no tremors. Hematologic: No lymphadenopathy, no easy bruising. Psychiatric: No confusion, no hallucinations, no sleep disturbance.    Physical Exam: Filed Vitals:   06/24/13 1451  BP: 120/72  Pulse: 84   the general appearance reveals a well-developed well-nourished woman in no distress.The head and neck exam reveals pupils equal and reactive.  Extraocular movements are full.  There is no scleral icterus.  The mouth and pharynx are normal.  The neck is supple.  The carotids reveal no bruits.  The jugular venous pressure is normal.  The  thyroid is not enlarged.  There is no lymphadenopathy.  The chest is clear to percussion and auscultation.  There are no rales or rhonchi.  Expansion of the chest is symmetrical.  The precordium is quiet.  The pulse is irregularly irregular The first heart sound is normal.  The second heart sound is physiologically split.  There is a grade 1/6 apical systolic murmur.  No diastolic murmur.  No gallop.  There is no abnormal lift or heave.  The abdomen is soft and nontender.  The bowel sounds are normal.  The liver and spleen are not enlarged.  There are no abdominal masses.  There are no abdominal bruits.  Extremities reveal good pedal pulses.  There is no phlebitis or edema.  There is no cyanosis or clubbing.  Strength is normal and symmetrical in all extremities.  There is no lateralizing weakness.  There are no sensory deficits.  The skin is warm and dry.  There is no rash.  She has problems with her memory.  EKG from the hospital was reviewed and showed no acute changes..  Assessment / Plan:  Continue on same medication.  Recheck in 4 months for office visit and EKG.

## 2013-06-24 NOTE — Assessment & Plan Note (Signed)
She has had no further weight loss.  Her weight is stable at 118 pounds and she states that her appetite is stable

## 2013-06-24 NOTE — Patient Instructions (Addendum)
Your physician recommends that you continue on your current medications as directed. Please refer to the Current Medication list given to you today.   Your physician recommends that you schedule a follow-up appointment in: 4 months / ekg

## 2013-06-24 NOTE — Assessment & Plan Note (Signed)
The patient has had no TIA symptoms.  She is not having any problems from the Xarelto

## 2013-06-24 NOTE — Assessment & Plan Note (Signed)
She denies any recent symptoms of lightheadedness or dizziness

## 2013-09-04 ENCOUNTER — Other Ambulatory Visit: Payer: Self-pay | Admitting: *Deleted

## 2013-09-04 MED ORDER — ROSUVASTATIN CALCIUM 5 MG PO TABS: 5.0000 mg | ORAL_TABLET | Freq: Every day | ORAL | Status: DC

## 2013-10-26 ENCOUNTER — Ambulatory Visit (INDEPENDENT_AMBULATORY_CARE_PROVIDER_SITE_OTHER): Payer: Medicare Other | Admitting: Cardiology

## 2013-10-26 ENCOUNTER — Encounter: Payer: Self-pay | Admitting: Cardiology

## 2013-10-26 VITALS — BP 118/80 | HR 86 | Ht 65.5 in | Wt 121.8 lb

## 2013-10-26 DIAGNOSIS — R634 Abnormal weight loss: Secondary | ICD-10-CM

## 2013-10-26 DIAGNOSIS — R55 Syncope and collapse: Secondary | ICD-10-CM

## 2013-10-26 DIAGNOSIS — I4510 Unspecified right bundle-branch block: Secondary | ICD-10-CM

## 2013-10-26 DIAGNOSIS — I4891 Unspecified atrial fibrillation: Secondary | ICD-10-CM

## 2013-10-26 NOTE — Assessment & Plan Note (Signed)
The patient has not had any further episodes of syncope. 

## 2013-10-26 NOTE — Assessment & Plan Note (Signed)
The patient's appetite has improved somewhat and she is actually gained 3 pounds since last visit.

## 2013-10-26 NOTE — Patient Instructions (Signed)
Your physician recommends that you continue on your current medications as directed. Please refer to the Current Medication list given to you today.  Your physician wants you to follow-up in: 6 month ov/ekg You will receive a reminder letter in the mail two months in advance. If you don't receive a letter, please call our office to schedule the follow-up appointment.  

## 2013-10-26 NOTE — Progress Notes (Signed)
Penny [name] Date of Birth:  Apr 03, 1929 [address] [place], [place]  [address] [phone]         Fax   [phone]  History of Present Illness: This pleasant elderly woman is seen for a scheduled followup office visit. She has a history of chronic atrial fibrillation. She also has a past history of syncope and collapse which occurred at about the beginning of July 2013. She was found to have low blood pressure following that episode and her lisinopril was stopped. Subsequently the patient did well until 01/12/13 when she had another episode of syncope at a restaurant and was hospitalized briefly. She subsequently wore a 30 day event monitor. The event monitor did not show any cause for her syncope. She was in constant atrial fibrillation with a controlled ventricular response and no excessive bradycardia or tachycardia was seen. The patient reports that she has been feeling better. Her daughter who is a Engineer, civil (consulting) also states that she thinks that her mother has been doing better since we cut her back on her digoxin to just a small dose of 0.125 Monday Wednesday and Friday only.  Since we last saw her she was hospitalized with a kidney stone which was removed on 06/04/13.  She spent 3 nights in the hospital postop.  Her anticoagulation was held appropriately prior to the surgery.  She is no longer on Coumadin.  Her PCP switched her to Xarelto and I agree with that decision.  She has recently been diagnosed with osteoporosis and is being treated for this by Dr. Clelia Croft. Current Outpatient Prescriptions  Medication Sig Dispense Refill  . Ascorbic Acid (VITAMIN C) 500 MG tablet Take 500 mg by mouth daily.        . Cholecalciferol (VITAMIN D-3) 5000 UNITS TABS Take 5,000 Units by mouth daily.      . diazepam (VALIUM) 5 MG tablet Take 2.5-5 mg by mouth every 12 (twelve) hours as needed for anxiety.      . digoxin (LANOXIN) 0.125 MG tablet Take 0.125 mg by mouth 3 (three) times a week. 1 pill  on Monday, Wednesday, and Friday      . donepezil (ARICEPT) 10 MG tablet Take 10 mg by mouth at bedtime.       . meclizine (ANTIVERT) 25 MG tablet Take 25 mg by mouth 3 (three) times daily as needed for dizziness.       . metoprolol tartrate (LOPRESSOR) 25 MG tablet Take 12.5 mg by mouth every morning.      . Multiple Vitamin (MULTIVITAMIN WITH MINERALS) TABS Take 1 tablet by mouth daily.      . Omega-3 Fatty Acids (FISH OIL PO) Take 1 capsule by mouth daily.       . Rivaroxaban (XARELTO) 20 MG TABS tablet Take 1 tablet (20 mg total) by mouth daily.  30 tablet    . rosuvastatin (CRESTOR) 5 MG tablet Take 1 tablet (5 mg total) by mouth at bedtime.  90 tablet  0   No current facility-administered medications for this visit.    Allergies  Allergen Reactions  . Zocor [Simvastatin]   . Sulfa Antibiotics Rash    Patient Active Problem List   Diagnosis Date Noted  . Atrial fibrillation 02/26/2011    Priority: High  . Malaise and fatigue 03/23/2011    Priority: Medium  . Bradycardia 01/14/2013  . UTI (urinary tract infection) 01/13/2013    Class: Acute  . Syncope and collapse 05/28/2012  . Weight  loss, unintentional 03/04/2012  . Inguinal hernia 09/25/2011  . Dizziness - light-headed 06/27/2011  . Right bundle branch block 03/23/2011  . Hypercholesterolemia 03/23/2011  . Long term current use of anticoagulant 02/26/2011    History  Smoking status  . Former Smoker -- 5 years  . Types: Cigarettes  . Quit date: 11/19/1972  Smokeless tobacco  . Never Used    History  Alcohol Use No    Family History  Problem Relation Age of Onset  . Heart disease Mother     Review of Systems: Constitutional: no fever chills diaphoresis or fatigue or change in weight.  Head and neck: no hearing loss, no epistaxis, no photophobia or visual disturbance. Respiratory: No cough, shortness of breath or wheezing. Cardiovascular: No chest pain peripheral edema, palpitations. Gastrointestinal: No  abdominal distention, no abdominal pain, no change in bowel habits hematochezia or melena. Genitourinary: No dysuria, no frequency, no urgency, no nocturia. Musculoskeletal:No arthralgias, no back pain, no gait disturbance or myalgias. Neurological: No dizziness, no headaches, no numbness, no seizures, no syncope, no weakness, no tremors. Hematologic: No lymphadenopathy, no easy bruising. Psychiatric: No confusion, no hallucinations, no sleep disturbance.    Physical Exam: Filed Vitals:   10/26/13 1606  BP: 118/80  Pulse: 86   the general appearance reveals a well-developed well-nourished woman in no distress.The head and neck exam reveals pupils equal and reactive.  Extraocular movements are full.  There is no scleral icterus.  The mouth and pharynx are normal.  The neck is supple.  The carotids reveal no bruits.  The jugular venous pressure is normal.  The  thyroid is not enlarged.  There is no lymphadenopathy.  The chest is clear to percussion and auscultation.  There are no rales or rhonchi.  Expansion of the chest is symmetrical.  The precordium is quiet.  The pulse is irregularly irregular The first heart sound is normal.  The second heart sound is physiologically split.  There is a grade 1/6 apical systolic murmur.  No diastolic murmur.  No gallop.  There is no abnormal lift or heave.  The abdomen is soft and nontender.  The bowel sounds are normal.  The liver and spleen are not enlarged.  There are no abdominal masses.  There are no abdominal bruits.  Extremities reveal good pedal pulses.  There is no phlebitis or edema.  There is no cyanosis or clubbing.  Strength is normal and symmetrical in all extremities.  There is no lateralizing weakness.  There are no sensory deficits.  The skin is warm and dry.  There is no rash.  She has problems with her memory.  EKG shows atrial fibrillation with heart rate of 86.  Right bundle branch block is old.  No ischemic changes.  Assessment /  Plan:  Continue on same medication.  Recheck in 6 months for office visit and EKG.

## 2013-10-26 NOTE — Assessment & Plan Note (Signed)
The patient has not had any TIA symptoms.  She denies any chest pain or shortness of breath with ordinary activity.[name]

## 2013-11-10 ENCOUNTER — Ambulatory Visit (HOSPITAL_COMMUNITY)
Admission: RE | Admit: 2013-11-10 | Discharge: 2013-11-10 | Disposition: A | Discharge status: Home / Self Care | Payer: Medicare Other | Source: Ambulatory Visit | Attending: Internal Medicine | Admitting: Internal Medicine

## 2013-11-10 ENCOUNTER — Other Ambulatory Visit (HOSPITAL_COMMUNITY): Payer: Self-pay | Admitting: Internal Medicine

## 2013-11-10 ENCOUNTER — Encounter (HOSPITAL_COMMUNITY): Payer: Self-pay

## 2013-11-10 DIAGNOSIS — M810 Age-related osteoporosis without current pathological fracture: Secondary | ICD-10-CM | POA: Insufficient documentation

## 2013-11-10 MED ORDER — SODIUM CHLORIDE 0.9 % IV SOLN
Freq: Once | INTRAVENOUS | Status: AC
  Administered 2013-11-10: 20 mL/h via INTRAVENOUS

## 2013-11-10 MED ORDER — ZOLEDRONIC ACID 5 MG/100ML IV SOLN
5.0000 mg | Freq: Once | INTRAVENOUS | Status: AC
  Administered 2013-11-10: 5 mg via INTRAVENOUS
  Filled 2013-11-10: qty 100

## 2013-11-10 NOTE — Progress Notes (Signed)
Pt's daughter has informed me that she noticed her mom had gross hematuria in her urine  upon arrival to short stay. Pt has no complaints and is without fever.  Notified Dr. Alver Fisher office and spoke with Lannette Donath at office.  They said to proceed with Reclast infusion.

## 2013-12-10 ENCOUNTER — Ambulatory Visit: Payer: Self-pay | Admitting: Cardiology

## 2013-12-10 DIAGNOSIS — I4891 Unspecified atrial fibrillation: Secondary | ICD-10-CM

## 2014-02-09 ENCOUNTER — Other Ambulatory Visit: Payer: Self-pay | Admitting: Cardiology

## 2014-03-02 ENCOUNTER — Other Ambulatory Visit: Payer: Self-pay | Admitting: Cardiology

## 2014-03-17 ENCOUNTER — Encounter (HOSPITAL_COMMUNITY): Payer: Self-pay | Admitting: Emergency Medicine

## 2014-03-17 ENCOUNTER — Emergency Department (HOSPITAL_COMMUNITY)
Admission: EM | Admit: 2014-03-17 | Discharge: 2014-03-17 | Disposition: A | Discharge status: Home / Self Care | Payer: Medicare Other | Source: Home / Self Care | Attending: Family Medicine | Admitting: Family Medicine

## 2014-03-17 DIAGNOSIS — N390 Urinary tract infection, site not specified: Secondary | ICD-10-CM

## 2014-03-17 LAB — POCT URINALYSIS DIP (DEVICE)
GLUCOSE, UA: NEGATIVE mg/dL
KETONES UR: NEGATIVE mg/dL
Nitrite: NEGATIVE
Protein, ur: >=300 mg/dL — AB
Specific Gravity, Urine: 1.02 (ref 1.005–1.030)
Urobilinogen, UA: 2 mg/dL — ABNORMAL HIGH (ref 0.0–1.0)
pH: 8.5 — ABNORMAL HIGH (ref 5.0–8.0)

## 2014-03-17 MED ORDER — ONDANSETRON 4 MG PO TBDP
4.0000 mg | ORAL_TABLET | Freq: Once | ORAL | Status: AC
  Administered 2014-03-17: 4 mg via ORAL

## 2014-03-17 MED ORDER — ONDANSETRON HCL 4 MG PO TABS: 4.0000 mg | ORAL_TABLET | Freq: Three times a day (TID) | ORAL | Status: DC | PRN

## 2014-03-17 MED ORDER — CEPHALEXIN 500 MG PO CAPS: 500.0000 mg | ORAL_CAPSULE | Freq: Four times a day (QID) | ORAL | Status: DC

## 2014-03-17 MED ORDER — ONDANSETRON 4 MG PO TBDP
ORAL_TABLET | ORAL | Status: AC
  Filled 2014-03-17: qty 1

## 2014-03-17 NOTE — ED Notes (Signed)
See physicians note; Dr. Georgina Snell in w/pt Pt c/o feeling fatigue, nauseas onset 1.5 days Sx also include urinary freq/urgency Alert w/no signs of acute distress.

## 2014-03-17 NOTE — ED Provider Notes (Signed)
Penny [name] is a 80 y.o. female who presents to Urgent Care today for fatigue. Patient has had one and a half days of fatigue dry heating urinary frequency and urgency. She is eating and drinking less than normal. No diarrhea or constipation. No abdominal pain cough congestion runny nose or shortness of breath. No medications tried yet.   Past Medical History  Diagnosis Date  . Long-term (current) use of anticoagulants   . Chronic shoulder pain   . Heart palpitations     rare  . Memory problem   . Depression   . Atrial fibrillation   . Hyperlipidemia   . Dyslipidemia   . Seasonal allergies   . Anxiety   . History of kidney stones    History  Substance Use Topics  . Smoking status: Former Smoker -- 5 years    Types: Cigarettes    Quit date: 11/19/1972  . Smokeless tobacco: Never Used  . Alcohol Use: No   ROS as above Medications: No current facility-administered medications for this encounter.   Current Outpatient Prescriptions  Medication Sig Dispense Refill  . Ascorbic Acid (VITAMIN C) 500 MG tablet Take 500 mg by mouth daily.        . cephALEXin (KEFLEX) 500 MG capsule Take 1 capsule (500 mg total) by mouth 4 (four) times daily.  28 capsule  0  . Cholecalciferol (VITAMIN D-3) 5000 UNITS TABS Take 5,000 Units by mouth daily.      . CRESTOR 5 MG tablet take 1 tablet by mouth at bedtime  90 tablet  0  . diazepam (VALIUM) 5 MG tablet Take 2.5-5 mg by mouth every 12 (twelve) hours as needed for anxiety.      . digoxin (LANOXIN) 0.125 MG tablet Take 0.125 mg by mouth 3 (three) times a week. 1 pill on Monday, Wednesday, and Friday      . donepezil (ARICEPT) 10 MG tablet Take 10 mg by mouth at bedtime.       . meclizine (ANTIVERT) 25 MG tablet Take 25 mg by mouth 3 (three) times daily as needed for dizziness.       . metoprolol tartrate (LOPRESSOR) 25 MG tablet Take 12.5 mg by mouth every morning.      . metoprolol tartrate (LOPRESSOR) 25 MG tablet TAKE ONE-HALF TABLET BY MOUTH  EVERY DAY  90 tablet  0  . Multiple Vitamin (MULTIVITAMIN WITH MINERALS) TABS Take 1 tablet by mouth daily.      . Omega-3 Fatty Acids (FISH OIL PO) Take 1 capsule by mouth daily.       . ondansetron (ZOFRAN) 4 MG tablet Take 1 tablet (4 mg total) by mouth every 8 (eight) hours as needed for nausea or vomiting.  12 tablet  0  . Rivaroxaban (XARELTO) 20 MG TABS tablet Take 1 tablet (20 mg total) by mouth daily.  30 tablet      Exam:  BP 132/70  Pulse 93  Temp(Src) 97 F (36.1 C) (Oral)  Resp 24  SpO2 95% Filed Vitals:   03/17/14 1645 03/17/14 1658 03/17/14 1700 03/17/14 1704  BP: 127/59 124/74 123/74 132/70  Pulse: 95 81 88 93  Temp: 97 F (36.1 C)     TempSrc: Oral     Resp: 24     SpO2: 95%       Gen: Well NAD nontoxic appearing HEENT: EOMI,  MMM Lungs: Normal work of breathing. CTABL Heart: RRR no MRG Abd: NABS, Soft. NT, ND no CV angle tenderness  to percussion Exts: Brisk capillary refill, warm and well perfused.   Results for orders placed during the hospital encounter of 03/17/14 (from the past 24 hour(s))  POCT URINALYSIS DIP (DEVICE)     Status: Abnormal   Collection Time    03/17/14  4:53 PM      Result Value Ref Range   Glucose, UA NEGATIVE  NEGATIVE mg/dL   Bilirubin Urine SMALL (*) NEGATIVE   Ketones, ur NEGATIVE  NEGATIVE mg/dL   Specific Gravity, Urine 1.020  1.005 - 1.030   Hgb urine dipstick LARGE (*) NEGATIVE   pH 8.5 (*) 5.0 - 8.0   Protein, ur >=300 (*) NEGATIVE mg/dL   Urobilinogen, UA 2.0 (*) 0.0 - 1.0 mg/dL   Nitrite NEGATIVE  NEGATIVE   Leukocytes, UA SMALL (*) NEGATIVE   No results found.  Assessment and Plan: 80 y.o. female with urinary tract infection. Plan to treat with Zofran and Keflex. Urine culture pending. Encourage oral hydration. Followup with primary care provider.  Discussed warning signs or symptoms. Please see discharge instructions. Patient expresses understanding.    Gregor Hams, MD 03/17/14 [phone]

## 2014-03-17 NOTE — Discharge Instructions (Signed)
Thank you for coming in today. Drink plenty of fluids.  Come back as needed.  Take the keflex antibiotic 4x daily for 1 week.  Use zofran as needed for vomting.  If your belly pain worsens, or you have high fever, bad vomiting, blood in your stool or black tarry stool go to the Emergency Room.   Urinary Tract Infection Urinary tract infections (UTIs) can develop anywhere along your urinary tract. Your urinary tract is your body's drainage system for removing wastes and extra water. Your urinary tract includes two kidneys, two ureters, a bladder, and a urethra. Your kidneys are a pair of bean-shaped organs. Each kidney is about the size of your fist. They are located below your ribs, one on each side of your spine. CAUSES Infections are caused by microbes, which are microscopic organisms, including fungi, viruses, and bacteria. These organisms are so small that they can only be seen through a microscope. Bacteria are the microbes that most commonly cause UTIs. SYMPTOMS  Symptoms of UTIs may vary by age and gender of the patient and by the location of the infection. Symptoms in young women typically include a frequent and intense urge to urinate and a painful, burning feeling in the bladder or urethra during urination. Older women and men are more likely to be tired, shaky, and weak and have muscle aches and abdominal pain. A fever may mean the infection is in your kidneys. Other symptoms of a kidney infection include pain in your back or sides below the ribs, nausea, and vomiting. DIAGNOSIS To diagnose a UTI, your caregiver will ask you about your symptoms. Your caregiver also will ask to provide a urine sample. The urine sample will be tested for bacteria and white blood cells. White blood cells are made by your body to help fight infection. TREATMENT  Typically, UTIs can be treated with medication. Because most UTIs are caused by a bacterial infection, they usually can be treated with the use of  antibiotics. The choice of antibiotic and length of treatment depend on your symptoms and the type of bacteria causing your infection. HOME CARE INSTRUCTIONS  If you were prescribed antibiotics, take them exactly as your caregiver instructs you. Finish the medication even if you feel better after you have only taken some of the medication.  Drink enough water and fluids to keep your urine clear or pale yellow.  Avoid caffeine, tea, and carbonated beverages. They tend to irritate your bladder.  Empty your bladder often. Avoid holding urine for long periods of time.  Empty your bladder before and after sexual intercourse.  After a bowel movement, women should cleanse from front to back. Use each tissue only once. SEEK MEDICAL CARE IF:   You have back pain.  You develop a fever.  Your symptoms do not begin to resolve within 3 days. SEEK IMMEDIATE MEDICAL CARE IF:   You have severe back pain or lower abdominal pain.  You develop chills.  You have nausea or vomiting.  You have continued burning or discomfort with urination. MAKE SURE YOU:   Understand these instructions.  Will watch your condition.  Will get help right away if you are not doing well or get worse. Document Released: 08/15/2005 Document Revised: 05/06/2012 Document Reviewed: 12/14/2011 [facility] Patient Information 2014 [place].

## 2014-03-19 LAB — URINE CULTURE: Colony Count: >=100000

## 2014-03-19 NOTE — ED Notes (Signed)
Urine culture: >100,000 colonies Klebsiella pneumoniae.  Pt. adequately treated with Keflex. Hanley Seamen Laine Giovanetti 03/19/2014

## 2014-03-20 ENCOUNTER — Telehealth: Payer: Self-pay | Admitting: Physician Assistant

## 2014-03-20 NOTE — Telephone Encounter (Signed)
Patient called requesting a refill on her diazepam.   She was advised that this cannot be refilled over the phone.   Dr. Brigitte Pulse filled the original prescription and she is to call his office on Monday for a refill. The patient indicated understanding of this plan and agreed to it.

## 2014-03-31 ENCOUNTER — Encounter (HOSPITAL_COMMUNITY): Payer: Self-pay | Admitting: Emergency Medicine

## 2014-03-31 ENCOUNTER — Emergency Department (HOSPITAL_COMMUNITY): Payer: Medicare Other

## 2014-03-31 ENCOUNTER — Inpatient Hospital Stay (HOSPITAL_COMMUNITY)
Admission: EM | Admit: 2014-03-31 | Discharge: 2014-04-02 | DRG: 563 | Disposition: A | Discharge status: Skilled Nursing Facility | Payer: Medicare Other | Attending: Internal Medicine | Admitting: Internal Medicine

## 2014-03-31 DIAGNOSIS — R269 Unspecified abnormalities of gait and mobility: Secondary | ICD-10-CM | POA: Diagnosis present

## 2014-03-31 DIAGNOSIS — M25519 Pain in unspecified shoulder: Secondary | ICD-10-CM | POA: Diagnosis present

## 2014-03-31 DIAGNOSIS — Z7901 Long term (current) use of anticoagulants: Secondary | ICD-10-CM

## 2014-03-31 DIAGNOSIS — F0280 Dementia in other diseases classified elsewhere without behavioral disturbance: Secondary | ICD-10-CM | POA: Diagnosis present

## 2014-03-31 DIAGNOSIS — E785 Hyperlipidemia, unspecified: Secondary | ICD-10-CM | POA: Diagnosis present

## 2014-03-31 DIAGNOSIS — G309 Alzheimer's disease, unspecified: Secondary | ICD-10-CM | POA: Diagnosis present

## 2014-03-31 DIAGNOSIS — F0390 Unspecified dementia without behavioral disturbance: Secondary | ICD-10-CM | POA: Diagnosis present

## 2014-03-31 DIAGNOSIS — I10 Essential (primary) hypertension: Secondary | ICD-10-CM | POA: Diagnosis present

## 2014-03-31 DIAGNOSIS — W19XXXA Unspecified fall, initial encounter: Secondary | ICD-10-CM | POA: Diagnosis present

## 2014-03-31 DIAGNOSIS — G8929 Other chronic pain: Secondary | ICD-10-CM | POA: Diagnosis present

## 2014-03-31 DIAGNOSIS — I4891 Unspecified atrial fibrillation: Secondary | ICD-10-CM | POA: Diagnosis present

## 2014-03-31 DIAGNOSIS — IMO0002 Reserved for concepts with insufficient information to code with codable children: Secondary | ICD-10-CM | POA: Diagnosis present

## 2014-03-31 DIAGNOSIS — S42309A Unspecified fracture of shaft of humerus, unspecified arm, initial encounter for closed fracture: Secondary | ICD-10-CM

## 2014-03-31 DIAGNOSIS — E46 Unspecified protein-calorie malnutrition: Secondary | ICD-10-CM | POA: Diagnosis present

## 2014-03-31 DIAGNOSIS — R55 Syncope and collapse: Secondary | ICD-10-CM | POA: Diagnosis present

## 2014-03-31 DIAGNOSIS — I8390 Asymptomatic varicose veins of unspecified lower extremity: Secondary | ICD-10-CM | POA: Diagnosis present

## 2014-03-31 DIAGNOSIS — Z87891 Personal history of nicotine dependence: Secondary | ICD-10-CM

## 2014-03-31 DIAGNOSIS — I959 Hypotension, unspecified: Secondary | ICD-10-CM | POA: Diagnosis present

## 2014-03-31 DIAGNOSIS — I509 Heart failure, unspecified: Secondary | ICD-10-CM | POA: Diagnosis present

## 2014-03-31 DIAGNOSIS — B9620 Unspecified Escherichia coli [E. coli] as the cause of diseases classified elsewhere: Secondary | ICD-10-CM | POA: Diagnosis present

## 2014-03-31 DIAGNOSIS — Z79899 Other long term (current) drug therapy: Secondary | ICD-10-CM

## 2014-03-31 DIAGNOSIS — R4181 Age-related cognitive decline: Secondary | ICD-10-CM | POA: Diagnosis present

## 2014-03-31 DIAGNOSIS — N390 Urinary tract infection, site not specified: Secondary | ICD-10-CM | POA: Diagnosis present

## 2014-03-31 DIAGNOSIS — S42213A Unspecified displaced fracture of surgical neck of unspecified humerus, initial encounter for closed fracture: Principal | ICD-10-CM | POA: Diagnosis present

## 2014-03-31 HISTORY — DX: Unspecified fracture of shaft of humerus, unspecified arm, initial encounter for closed fracture: S42.309A

## 2014-03-31 HISTORY — DX: Heart failure, unspecified: I50.9

## 2014-03-31 HISTORY — DX: Syncope and collapse: R55

## 2014-03-31 HISTORY — DX: Calculus of kidney: N20.0

## 2014-03-31 HISTORY — DX: Unspecified osteoarthritis, unspecified site: M19.90

## 2014-03-31 HISTORY — DX: Angina pectoris, unspecified: I20.9

## 2014-03-31 HISTORY — DX: Essential (primary) hypertension: I10

## 2014-03-31 HISTORY — DX: Dementia in other diseases classified elsewhere, unspecified severity, without behavioral disturbance, psychotic disturbance, mood disturbance, and anxiety: F02.80

## 2014-03-31 HISTORY — DX: Other specified disorders of bone density and structure, unspecified site: M85.80

## 2014-03-31 HISTORY — DX: Hematuria, unspecified: R31.9

## 2014-03-31 LAB — CBC WITH DIFFERENTIAL/PLATELET
Basophils Absolute: 0 10*3/uL (ref 0.0–0.1)
Basophils Relative: 0 % (ref 0–1)
Eosinophils Absolute: 0 10*3/uL (ref 0.0–0.7)
Eosinophils Relative: 1 % (ref 0–5)
HCT: 36.9 % (ref 36.0–46.0)
Hemoglobin: 11.9 g/dL — ABNORMAL LOW (ref 12.0–15.0)
Lymphocytes Relative: 14 % (ref 12–46)
Lymphs Abs: 0.8 10*3/uL (ref 0.7–4.0)
MCH: 29.5 pg (ref 26.0–34.0)
MCHC: 32.2 g/dL (ref 30.0–36.0)
MCV: 91.3 fL (ref 78.0–100.0)
Monocytes Absolute: 0.5 10*3/uL (ref 0.1–1.0)
Monocytes Relative: 10 % (ref 3–12)
Neutro Abs: 4.3 10*3/uL (ref 1.7–7.7)
Neutrophils Relative %: 75 % (ref 43–77)
Platelets: 114 10*3/uL — ABNORMAL LOW (ref 150–400)
RBC: 4.04 MIL/uL (ref 3.87–5.11)
RDW: 13.6 % (ref 11.5–15.5)
WBC: 5.7 10*3/uL (ref 4.0–10.5)

## 2014-03-31 LAB — URINALYSIS, ROUTINE W REFLEX MICROSCOPIC
Bilirubin Urine: NEGATIVE
Glucose, UA: NEGATIVE mg/dL
Ketones, ur: 15 mg/dL — AB
Nitrite: NEGATIVE
Protein, ur: 100 mg/dL — AB
Specific Gravity, Urine: 1.023 (ref 1.005–1.030)
Urobilinogen, UA: 1 mg/dL (ref 0.0–1.0)
pH: 6 (ref 5.0–8.0)

## 2014-03-31 LAB — BASIC METABOLIC PANEL
BUN: 20 mg/dL (ref 6–23)
CO2: 24 mEq/L (ref 19–32)
Calcium: 9.3 mg/dL (ref 8.4–10.5)
Chloride: 105 mEq/L (ref 96–112)
Creatinine, Ser: 0.78 mg/dL (ref 0.50–1.10)
GFR calc Af Amer: 87 mL/min — ABNORMAL LOW (ref >90)
GFR calc non Af Amer: 75 mL/min — ABNORMAL LOW (ref >90)
Glucose, Bld: 93 mg/dL (ref 70–99)
Potassium: 3.7 mEq/L (ref 3.7–5.3)
Sodium: 143 mEq/L (ref 137–147)

## 2014-03-31 LAB — URINE MICROSCOPIC-ADD ON

## 2014-03-31 LAB — MAGNESIUM: Magnesium: 1.9 mg/dL (ref 1.5–2.5)

## 2014-03-31 LAB — DIGOXIN LEVEL: Digoxin Level: <0.3 ng/mL — ABNORMAL LOW (ref 0.8–2.0)

## 2014-03-31 LAB — TROPONIN I: Troponin I: <0.3 ng/mL (ref <0.30)

## 2014-03-31 MED ORDER — DEXTROSE 5 % IV SOLN: 1.0000 g | INTRAVENOUS | Status: DC

## 2014-03-31 MED ORDER — VITAMIN C 500 MG PO TABS
500.0000 mg | ORAL_TABLET | Freq: Every day | ORAL | Status: DC
  Administered 2014-03-31 – 2014-04-02 (×3): 500 mg via ORAL
  Filled 2014-03-31 (×3): qty 1

## 2014-03-31 MED ORDER — ONDANSETRON HCL 4 MG PO TABS: 4.0000 mg | ORAL_TABLET | Freq: Three times a day (TID) | ORAL | Status: DC | PRN

## 2014-03-31 MED ORDER — ADULT MULTIVITAMIN W/MINERALS CH
1.0000 | ORAL_TABLET | Freq: Every day | ORAL | Status: DC
  Administered 2014-03-31 – 2014-04-02 (×3): 1 via ORAL
  Filled 2014-03-31 (×3): qty 1

## 2014-03-31 MED ORDER — HYDROCODONE-ACETAMINOPHEN 5-325 MG PO TABS
1.0000 | ORAL_TABLET | ORAL | Status: DC | PRN
  Administered 2014-03-31 – 2014-04-01 (×2): 1 via ORAL
  Administered 2014-04-01: 2 via ORAL
  Administered 2014-04-02: 1 via ORAL
  Filled 2014-03-31 (×2): qty 1
  Filled 2014-03-31: qty 2
  Filled 2014-03-31: qty 1

## 2014-03-31 MED ORDER — DIAZEPAM 5 MG PO TABS
2.5000 mg | ORAL_TABLET | Freq: Two times a day (BID) | ORAL | Status: DC | PRN
  Administered 2014-03-31: 2.5 mg via ORAL
  Administered 2014-04-01: 5 mg via ORAL
  Filled 2014-03-31 (×2): qty 1

## 2014-03-31 MED ORDER — ADULT MULTIVITAMIN W/MINERALS CH: 1.0000 | ORAL_TABLET | Freq: Every day | ORAL | Status: DC

## 2014-03-31 MED ORDER — DIGOXIN 125 MCG PO TABS
0.1250 mg | ORAL_TABLET | ORAL | Status: DC
  Administered 2014-03-31 – 2014-04-02 (×2): 0.125 mg via ORAL
  Filled 2014-03-31 (×3): qty 1

## 2014-03-31 MED ORDER — VITAMIN D-3 125 MCG (5000 UT) PO TABS: 5000.0000 [IU] | ORAL_TABLET | Freq: Every day | ORAL | Status: DC

## 2014-03-31 MED ORDER — DONEPEZIL HCL 10 MG PO TABS
10.0000 mg | ORAL_TABLET | Freq: Every day | ORAL | Status: DC
  Administered 2014-03-31 – 2014-04-01 (×2): 10 mg via ORAL
  Filled 2014-03-31 (×3): qty 1

## 2014-03-31 MED ORDER — ATORVASTATIN CALCIUM 10 MG PO TABS
10.0000 mg | ORAL_TABLET | Freq: Every day | ORAL | Status: DC
  Administered 2014-04-01: 10 mg via ORAL
  Filled 2014-03-31 (×2): qty 1

## 2014-03-31 MED ORDER — METOPROLOL TARTRATE 12.5 MG HALF TABLET
12.5000 mg | ORAL_TABLET | Freq: Every morning | ORAL | Status: DC
  Administered 2014-04-01 – 2014-04-02 (×2): 12.5 mg via ORAL
  Filled 2014-03-31 (×2): qty 1

## 2014-03-31 MED ORDER — VITAMIN D3 25 MCG (1000 UNIT) PO TABS
5000.0000 [IU] | ORAL_TABLET | Freq: Every day | ORAL | Status: DC
  Administered 2014-03-31 – 2014-04-02 (×3): 5000 [IU] via ORAL
  Filled 2014-03-31 (×3): qty 5

## 2014-03-31 MED ORDER — SODIUM CHLORIDE 0.9 % IJ SOLN
3.0000 mL | Freq: Two times a day (BID) | INTRAMUSCULAR | Status: DC
  Administered 2014-03-31 – 2014-04-01 (×2): 3 mL via INTRAVENOUS

## 2014-03-31 MED ORDER — ACETAMINOPHEN 325 MG PO TABS
650.0000 mg | ORAL_TABLET | Freq: Four times a day (QID) | ORAL | Status: DC | PRN
  Administered 2014-04-02: 650 mg via ORAL
  Filled 2014-03-31: qty 2

## 2014-03-31 MED ORDER — ACETAMINOPHEN 650 MG RE SUPP: 650.0000 mg | Freq: Four times a day (QID) | RECTAL | Status: DC | PRN

## 2014-03-31 MED ORDER — DEXTROSE 5 % IV SOLN
1.0000 g | INTRAVENOUS | Status: DC
  Administered 2014-04-01 – 2014-04-02 (×2): 1 g via INTRAVENOUS
  Filled 2014-03-31 (×3): qty 10

## 2014-03-31 MED ORDER — POTASSIUM CHLORIDE IN NACL 20-0.9 MEQ/L-% IV SOLN
INTRAVENOUS | Status: DC
  Administered 2014-03-31 – 2014-04-02 (×5): via INTRAVENOUS
  Filled 2014-03-31 (×9): qty 1000

## 2014-03-31 MED ORDER — DIGOXIN 125 MCG PO TABS: 0.1250 mg | ORAL_TABLET | ORAL | Status: DC

## 2014-03-31 MED ORDER — DEXTROSE 5 % IV SOLN
1.0000 g | Freq: Once | INTRAVENOUS | Status: AC
  Administered 2014-03-31: 1 g via INTRAVENOUS
  Filled 2014-03-31: qty 10

## 2014-03-31 MED ORDER — RIVAROXABAN 20 MG PO TABS
20.0000 mg | ORAL_TABLET | Freq: Every day | ORAL | Status: DC
  Administered 2014-03-31 – 2014-04-01 (×2): 20 mg via ORAL
  Filled 2014-03-31 (×3): qty 1

## 2014-03-31 NOTE — Progress Notes (Signed)
Orthopedic Tech Progress Note Patient Details:  Penny [name] 1929-11-03 [id number]  Ortho Devices Type of Ortho Device: Arm sling Ortho Device/Splint Location: left upper extremity Ortho Device/Splint Interventions: Application  Arm sling is applied to the left upper extremity. Wear and care are gone over verbally. Patients daughter understands. They will contact the nursing staff with any further questions or concerns.  Ashok Cordia 03/31/2014, 3:57 PM

## 2014-03-31 NOTE — ED Notes (Signed)
Ortho paged. 

## 2014-03-31 NOTE — ED Notes (Signed)
[facility] called x2. States Dr. Aletha Halim be paged to this RN

## 2014-03-31 NOTE — H&P (Addendum)
Penny [name] is an 80 y.o. female.   Chief Complaint: I fell and broke my left arm HPI:  The patient is an 80 year old Caucasian woman with several medical problems who had an unwitnessed fall earlier today onto her left shoulder. She had immediate pain and was transported to the emergency room where a proximal left humerus fracture was diagnosed. She is also felt to have syncope causing her fall as she has little recall of the events around the fall. She has not had palpitations, although she has chronic atrial fibrillation. She has not had any recent chest discomfort or shortness of breath. About 2 weeks ago she had a urinary tract infection was treated with antibiotics, and since that time she's had very little food and fluid and gradually worsening fatigue. She has a history of syncope in the past with workup by her cardiologist unremarkable. She has chronic atrial fibrillation since at least 2012 with failed cardioversion, for which she is on beta blocker treatment and Xarelto for stroke prevention. Her medical history is also significant for mild cognitive deficits, osteoarthritis, kidney stones, and a fall with right shoulder fracture in 2009 leading to total shoulder arthroplasty. Past Medical History  Diagnosis Date  . Long-term (current) use of anticoagulants   . Chronic shoulder pain   . Heart palpitations     rare  . Memory problem   . Depression   . Atrial fibrillation   . Hyperlipidemia   . Dyslipidemia   . Seasonal allergies   . Anxiety   . History of kidney stones   . Hypertension   . CHF (congestive heart failure)   . Atrial fibrillation     Medications Prior to Admission  Medication Sig Dispense Refill  . Ascorbic Acid (VITAMIN C) 500 MG tablet Take 500 mg by mouth daily.        . Cholecalciferol (VITAMIN D-3) 5000 UNITS TABS Take 5,000 Units by mouth daily.      . CRESTOR 5 MG tablet take 1 tablet by mouth at bedtime  90 tablet  0  . diazepam (VALIUM) 5 MG tablet Take  2.5-5 mg by mouth every 12 (twelve) hours as needed for anxiety.      . digoxin (LANOXIN) 0.125 MG tablet Take 0.125 mg by mouth 3 (three) times a week. 1 pill on Monday, Wednesday, and Friday      . donepezil (ARICEPT) 10 MG tablet Take 10 mg by mouth at bedtime.       . meclizine (ANTIVERT) 25 MG tablet Take 25 mg by mouth 3 (three) times daily as needed for dizziness.       . metoprolol tartrate (LOPRESSOR) 25 MG tablet Take 12.5 mg by mouth every morning.      . Multiple Vitamin (MULTIVITAMIN WITH MINERALS) TABS Take 1 tablet by mouth daily.      . Omega-3 Fatty Acids (FISH OIL PO) Take 1 capsule by mouth daily.       . ondansetron (ZOFRAN) 4 MG tablet Take 1 tablet (4 mg total) by mouth every 8 (eight) hours as needed for nausea or vomiting.  12 tablet  0  . Rivaroxaban (XARELTO) 20 MG TABS tablet Take 1 tablet (20 mg total) by mouth daily.  30 tablet      ADDITIONAL HOME MEDICATIONS: No additional medications  PHYSICIANS INVOLVED IN CARE: Carmie Kanner (primary care), Darlin Coco (cardiology), Esmond Plants [facility] orthopedics), Franchot Gallo (urology)  Past Surgical History  Procedure Laterality Date  . Partial hysterectomy    .  Ureteral stent placement      bilateral / for stenosis  . Removal of growth  2007    growth under kidney removed and possibly kindey stones removed  . Knee arthroscopy Right   . Total shoulder arthroplasty Right 2009  . Nephrolithotomy Right 06/04/2013    Procedure: NEPHROLITHOTOMY PERCUTANEOUS;  Surgeon: Franchot Gallo, MD;  Location: [facility];  Service: Urology;  Laterality: Right;    Family History  Problem Relation Age of Onset  . Heart disease Mother      Social History:  reports that she quit smoking about 41 years ago. Her smoking use included Cigarettes. She smoked 0.00 packs per day for 5 years. She has never used smokeless tobacco. She reports that she does not drink alcohol or use illicit drugs.  Allergies:  Allergies   Allergen Reactions  . Zocor [Simvastatin]   . Sulfa Antibiotics Rash     ROS: arthritis, fainting of seizures, heart palpitation and Mild cognitive deficit, nephrolithiasis, osteoarthritis, gait instability, atrial fibrillation, protein calorie malnutrition  PHYSICAL EXAM: Blood pressure 127/64, pulse 88, temperature 98 F (36.7 C), temperature source Oral, resp. rate 22, height 5' 4"  (1.626 m), weight 56.972 kg (125 lb 9.6 oz), SpO2 97.00%. In general, the patient is a frail elderly white woman who was in no apparent distress while lying partially upright in bed with her left upper extremity in a sling. HEENT exam was within normal limits, neck was supple without jugular venous distention or carotid bruit, chest was clear to auscultation, heart had an irregularly irregular rhythm without significant murmur or gallop, abdomen had normal bowel sounds and no hepatosplenomegaly or tenderness, left upper extremity was in a sling and her left hand had normal circulation and light touch sensation. She was alert and oriented x2, cranial nerves II through XII are normal, she was able to move all extremities somewhat. She had bilateral 1+ leg edema and prominent varicose veins.  Results for orders placed during the hospital encounter of 03/31/14 (from the past 48 hour(s))  TROPONIN I     Status: None   Collection Time    03/31/14 12:19 PM      Result Value Ref Range   Troponin I <0.30  <0.30 ng/mL   Comment:            Due to the release kinetics of cTnI,     a negative result within the first hours     of the onset of symptoms does not rule out     myocardial infarction with certainty.     If myocardial infarction is still suspected,     repeat the test at appropriate intervals.  MAGNESIUM     Status: None   Collection Time    03/31/14 12:19 PM      Result Value Ref Range   Magnesium 1.9  1.5 - 2.5 mg/dL  DIGOXIN LEVEL     Status: Abnormal   Collection Time    03/31/14 12:19 PM       Result Value Ref Range   Digoxin Level <0.3 (*) 0.8 - 2.0 ng/mL  CBC WITH DIFFERENTIAL     Status: Abnormal   Collection Time    03/31/14 12:19 PM      Result Value Ref Range   WBC 5.7  4.0 - 10.5 K/uL   RBC 4.04  3.87 - 5.11 MIL/uL   Hemoglobin 11.9 (*) 12.0 - 15.0 g/dL   HCT 36.9  36.0 - 46.0 %   MCV 91.3  78.0 - 100.0 fL   MCH 29.5  26.0 - 34.0 pg   MCHC 32.2  30.0 - 36.0 g/dL   RDW 13.6  11.5 - 15.5 %   Platelets 114 (*) 150 - 400 K/uL   Comment: REPEATED TO VERIFY     SPECIMEN CHECKED FOR CLOTS     PLATELET COUNT CONFIRMED BY SMEAR   Neutrophils Relative % 75  43 - 77 %   Neutro Abs 4.3  1.7 - 7.7 K/uL   Lymphocytes Relative 14  12 - 46 %   Lymphs Abs 0.8  0.7 - 4.0 K/uL   Monocytes Relative 10  3 - 12 %   Monocytes Absolute 0.5  0.1 - 1.0 K/uL   Eosinophils Relative 1  0 - 5 %   Eosinophils Absolute 0.0  0.0 - 0.7 K/uL   Basophils Relative 0  0 - 1 %   Basophils Absolute 0.0  0.0 - 0.1 K/uL  BASIC METABOLIC PANEL     Status: Abnormal   Collection Time    03/31/14 12:19 PM      Result Value Ref Range   Sodium 143  137 - 147 mEq/L   Potassium 3.7  3.7 - 5.3 mEq/L   Chloride 105  96 - 112 mEq/L   CO2 24  19 - 32 mEq/L   Glucose, Bld 93  70 - 99 mg/dL   BUN 20  6 - 23 mg/dL   Creatinine, Ser 0.78  0.50 - 1.10 mg/dL   Calcium 9.3  8.4 - 10.5 mg/dL   GFR calc non Af Amer 75 (*) >90 mL/min   GFR calc Af Amer 87 (*) >90 mL/min   Comment: (NOTE)     The eGFR has been calculated using the CKD EPI equation.     This calculation has not been validated in all clinical situations.     eGFR's persistently <90 mL/min signify possible Chronic Kidney     Disease.  URINALYSIS, ROUTINE W REFLEX MICROSCOPIC     Status: Abnormal   Collection Time    03/31/14  1:17 PM      Result Value Ref Range   Color, Urine AMBER (*) YELLOW   Comment: BIOCHEMICALS MAY BE AFFECTED BY COLOR   APPearance CLOUDY (*) CLEAR   Specific Gravity, Urine 1.023  1.005 - 1.030   pH 6.0  5.0 - 8.0    Glucose, UA NEGATIVE  NEGATIVE mg/dL   Hgb urine dipstick LARGE (*) NEGATIVE   Bilirubin Urine NEGATIVE  NEGATIVE   Ketones, ur 15 (*) NEGATIVE mg/dL   Protein, ur 100 (*) NEGATIVE mg/dL   Urobilinogen, UA 1.0  0.0 - 1.0 mg/dL   Nitrite NEGATIVE  NEGATIVE   Leukocytes, UA MODERATE (*) NEGATIVE  URINE MICROSCOPIC-ADD ON     Status: Abnormal   Collection Time    03/31/14  1:17 PM      Result Value Ref Range   Squamous Epithelial / LPF RARE  RARE   WBC, UA 21-50  <3 WBC/hpf   RBC / HPF TOO NUMEROUS TO COUNT  <3 RBC/hpf   Bacteria, UA MANY (*) RARE   Urine-Other FEW YEAST     Dg Ribs Unilateral W/chest Left  03/31/2014   CLINICAL DATA:  Fall.  EXAM: LEFT RIBS AND CHEST - 3+ VIEW  COMPARISON:  06/06/2013  FINDINGS: Normal heart size. No pleural effusion or edema. Lungs are hyperinflated but clear. Previous right shoulder arthroplasty. At comminuted fracture involving the proximal left humerus  is noted. No displaced rib fractures are identified.  IMPRESSION: 1. Left humerus fracture. 2. No rib fractures identified.   Electronically Signed   By: Kerby Moors M.D.   On: 03/31/2014 13:03   Ct Head Wo Contrast  03/31/2014   CLINICAL DATA:  Fall and passed out.  EXAM: CT HEAD WITHOUT CONTRAST  TECHNIQUE: Contiguous axial images were obtained from the base of the skull through the vertex without contrast.  COMPARISON:  01/13/2013  FINDINGS: There is stable cerebral atrophy. Stable low-density in the periventricular and subcortical white matter suggesting chronic changes. No evidence for acute hemorrhage, mass lesion, midline shift, hydrocephalus or large infarct. No acute bone abnormality. Stable lucent areas within the calvarium.  IMPRESSION: No acute intracranial abnormality.  Atrophy and evidence for chronic small vessel ischemic changes.   Electronically Signed   By: Markus Daft M.D.   On: 03/31/2014 14:49   Dg Shoulder Left  03/31/2014   CLINICAL DATA:  Fall.  Left shoulder pain  EXAM: LEFT  SHOULDER - 2+ VIEW  COMPARISON:  None.  FINDINGS: The bones are diffusely osteopenic. There is a comminuted fracture involving the proximal humerus at the level of the surgical neck. Mild varus angulation of the distal fracture fragments noted.  IMPRESSION: 1. Comminuted fracture involves the proximal left humerus.   Electronically Signed   By: Kerby Moors M.D.   On: 03/31/2014 13:00     Assessment/Plan #1 Syncope: Likely vasovagal and brought on by recent poor food and fluid intake with hypotension. It's possible her syncope was from an intermittent dangerous cardiac rhythm, so we will check telemetry. We'll also administer IV fluids overnight. #2 Left Humerus Fracture: She has a comminuted fracture of the proximal aspect of the humerus. We will request an orthopedics consultation for this as it could require surgery. #3 Atrial Fibrillation: Stable with a controlled ventricular rate and stroke protection with Xarelto. #4 Gait Instability: Likely moderately severe and we will have a physical therapist evaluate her ability to safely ambulate with her left upper extremity immobilized. She may need skilled nursing facility rehabilitation. #5 Bacteriuria: possible asymptomatic colonization and rocephin was started in the ER, which will be continued pending culture results.   Leanna Battles 03/31/2014, 8:39 PM

## 2014-03-31 NOTE — ED Notes (Signed)
Per [facility]- pt had an unwitnessed fall today. Pt was found on her left shoulder. Pt reports pain there. Pt was weak adn diaphoretic when ptar arrived. Pt received 132ml of NS and pressure increased from 80/40 to 112 palpated. CBG was 79. Pt is on blood thinners and has hx of a fib

## 2014-03-31 NOTE — ED Provider Notes (Signed)
CSN: [id number]     Arrival date & time 03/31/14  1114 History   First MD Initiated Contact with Patient 03/31/14 1125     Chief Complaint  Patient presents with  . Fall  . Hypotension     (Consider location/radiation/quality/duration/timing/severity/associated sxs/prior Treatment) HPI  80 year old female coming from home after an unwitnessed fall. Patient is unsure exactly why she fell. She was found face down with her left arm twisted underneath her. She currently complaining of left shoulder pain. She did strike her head has abrasion to afford it, but denies any headache or neck pain. She is on xarelto for history atrial fibrillation. Pt 's speech seems slow to son/husband at bedside but other more or less at her baseline.   Past Medical History  Diagnosis Date  . Long-term (current) use of anticoagulants   . Chronic shoulder pain   . Heart palpitations     rare  . Memory problem   . Depression   . Atrial fibrillation   . Hyperlipidemia   . Dyslipidemia   . Seasonal allergies   . Anxiety   . History of kidney stones    Past Surgical History  Procedure Laterality Date  . Partial hysterectomy    . Ureteral stent placement      bilateral / for stenosis  . Removal of growth  2007    growth under kidney removed and possibly kindey stones removed  . Knee arthroscopy Right   . Total shoulder arthroplasty Right 2009  . Nephrolithotomy Right 06/04/2013    Procedure: NEPHROLITHOTOMY PERCUTANEOUS;  Surgeon: Franchot Gallo, MD;  Location: [facility];  Service: Urology;  Laterality: Right;   Family History  Problem Relation Age of Onset  . Heart disease Mother    History  Substance Use Topics  . Smoking status: Former Smoker -- 5 years    Types: Cigarettes    Quit date: 11/19/1972  . Smokeless tobacco: Never Used  . Alcohol Use: No   OB History   Grav Para Term Preterm Abortions TAB SAB Ect Mult Living                 Review of Systems  All systems reviewed and  negative, other than as noted in HPI.   Allergies  Zocor and Sulfa antibiotics  Home Medications   Prior to Admission medications   Medication Sig Start Date End Date Taking? Authorizing Provider  Ascorbic Acid (VITAMIN C) 500 MG tablet Take 500 mg by mouth daily.     Yes Historical Provider, MD  Cholecalciferol (VITAMIN D-3) 5000 UNITS TABS Take 5,000 Units by mouth daily.   Yes Historical Provider, MD  CRESTOR 5 MG tablet take 1 tablet by mouth at bedtime 03/02/14  Yes Darlin Coco, MD  diazepam (VALIUM) 5 MG tablet Take 2.5-5 mg by mouth every 12 (twelve) hours as needed for anxiety.   Yes Historical Provider, MD  digoxin (LANOXIN) 0.125 MG tablet Take 0.125 mg by mouth 3 (three) times a week. 1 pill on Monday, Wednesday, and Friday 01/14/13  Yes Marton Redwood, MD  donepezil (ARICEPT) 10 MG tablet Take 10 mg by mouth at bedtime.    Yes Historical Provider, MD  meclizine (ANTIVERT) 25 MG tablet Take 25 mg by mouth 3 (three) times daily as needed for dizziness.    Yes Historical Provider, MD  metoprolol tartrate (LOPRESSOR) 25 MG tablet Take 12.5 mg by mouth every morning.   Yes Historical Provider, MD  Multiple Vitamin (MULTIVITAMIN WITH MINERALS) TABS Take  1 tablet by mouth daily.   Yes Historical Provider, MD  Omega-3 Fatty Acids (FISH OIL PO) Take 1 capsule by mouth daily.    Yes Historical Provider, MD  ondansetron (ZOFRAN) 4 MG tablet Take 1 tablet (4 mg total) by mouth every 8 (eight) hours as needed for nausea or vomiting. 03/17/14  Yes Gregor Hams, MD  Rivaroxaban (XARELTO) 20 MG TABS tablet Take 1 tablet (20 mg total) by mouth daily. 06/24/13  Yes Darlin Coco, MD   BP 97/68  Pulse 80  Temp(Src) 97.7 F (36.5 C) (Oral)  Resp 20  SpO2 99% Physical Exam  Nursing note and vitals reviewed. Constitutional: She appears well-developed and well-nourished. No distress.  HENT:  Head: Normocephalic.  Abrasion to forehead and nose.no bony tenderness.   Eyes: Conjunctivae are  normal. Right eye exhibits no discharge. Left eye exhibits no discharge.  Neck: Neck supple.  Cardiovascular: Normal rate and normal heart sounds.  Exam reveals no gallop and no friction rub.   No murmur heard. irreg irreg  Pulmonary/Chest: Effort normal and breath sounds normal. No respiratory distress.  Abdominal: Soft. She exhibits no distension. There is no tenderness.  Musculoskeletal: She exhibits no edema and no tenderness.  No midline spinal tenderness. exquisite tenderness L anterior shoulder/proximal humerus. NVI distally. Tenderness L chest wall.   Neurological: She is alert. No cranial nerve deficit. Coordination normal.  Skin: Skin is warm and dry.  Psychiatric: She has a normal mood and affect. Her behavior is normal. Thought content normal.    ED Course  Procedures (including critical care time) Labs Review Labs Reviewed  DIGOXIN LEVEL - Abnormal; Notable for the following:    Digoxin Level <0.3 (*)    All other components within normal limits  CBC WITH DIFFERENTIAL - Abnormal; Notable for the following:    Hemoglobin 11.9 (*)    All other components within normal limits  BASIC METABOLIC PANEL - Abnormal; Notable for the following:    GFR calc non Af Amer 75 (*)    GFR calc Af Amer 87 (*)    All other components within normal limits  TROPONIN I  MAGNESIUM  URINALYSIS, ROUTINE W REFLEX MICROSCOPIC    Imaging Review Dg Ribs Unilateral W/chest Left  03/31/2014   CLINICAL DATA:  Fall.  EXAM: LEFT RIBS AND CHEST - 3+ VIEW  COMPARISON:  06/06/2013  FINDINGS: Normal heart size. No pleural effusion or edema. Lungs are hyperinflated but clear. Previous right shoulder arthroplasty. At comminuted fracture involving the proximal left humerus is noted. No displaced rib fractures are identified.  IMPRESSION: 1. Left humerus fracture. 2. No rib fractures identified.   Electronically Signed   By: Kerby Moors M.D.   On: 03/31/2014 13:03   Dg Shoulder Left  03/31/2014   CLINICAL  DATA:  Fall.  Left shoulder pain  EXAM: LEFT SHOULDER - 2+ VIEW  COMPARISON:  None.  FINDINGS: The bones are diffusely osteopenic. There is a comminuted fracture involving the proximal humerus at the level of the surgical neck. Mild varus angulation of the distal fracture fragments noted.  IMPRESSION: 1. Comminuted fracture involves the proximal left humerus.   Electronically Signed   By: Kerby Moors M.D.   On: 03/31/2014 13:00     EKG Interpretation   Date/Time:  Wednesday Mar 31 2014 11:19:00 EDT Ventricular Rate:  132 PR Interval:    QRS Duration: 138 QT Interval:  349 QTC Calculation: 517 R Axis:   -18 Text Interpretation:  Atrial fibrillation  Paired ventricular premature  complexes Right bundle branch block Confirmed by Wilson Singer  MD, Farley ([id number])  on 03/31/2014 4:26:56 PM      MDM   Final diagnoses:  Syncope  UTI (urinary tract infection)  Humeral surgical neck fracture       Virgel Manifold, MD 03/31/14 1627

## 2014-03-31 NOTE — ED Notes (Signed)
Contacted flow manager, [facility] paged regarding admission orders for pt. Pt and family updated.

## 2014-03-31 NOTE — ED Notes (Signed)
MD at bedside. 

## 2014-03-31 NOTE — ED Notes (Signed)
Pt noted to have some reddness to forehead.

## 2014-04-01 LAB — CBC
HCT: 30.4 % — ABNORMAL LOW (ref 36.0–46.0)
Hemoglobin: 10.1 g/dL — ABNORMAL LOW (ref 12.0–15.0)
MCH: 29.5 pg (ref 26.0–34.0)
MCHC: 33.2 g/dL (ref 30.0–36.0)
MCV: 88.9 fL (ref 78.0–100.0)
Platelets: 122 10*3/uL — ABNORMAL LOW (ref 150–400)
RBC: 3.42 MIL/uL — ABNORMAL LOW (ref 3.87–5.11)
RDW: 13.6 % (ref 11.5–15.5)
WBC: 5.6 10*3/uL (ref 4.0–10.5)

## 2014-04-01 LAB — COMPREHENSIVE METABOLIC PANEL
ALK PHOS: 47 U/L (ref 39–117)
ALT: 6 U/L (ref 0–35)
AST: 24 U/L (ref 0–37)
Albumin: 2.6 g/dL — ABNORMAL LOW (ref 3.5–5.2)
BUN: 19 mg/dL (ref 6–23)
CO2: 22 meq/L (ref 19–32)
Calcium: 8.8 mg/dL (ref 8.4–10.5)
Chloride: 107 mEq/L (ref 96–112)
Creatinine, Ser: 0.66 mg/dL (ref 0.50–1.10)
GFR, EST NON AFRICAN AMERICAN: 79 mL/min — AB (ref >90)
GLUCOSE: 99 mg/dL (ref 70–99)
POTASSIUM: 3.6 meq/L — AB (ref 3.7–5.3)
SODIUM: 142 meq/L (ref 137–147)
Total Bilirubin: 0.6 mg/dL (ref 0.3–1.2)
Total Protein: 5.6 g/dL — ABNORMAL LOW (ref 6.0–8.3)

## 2014-04-01 MED ORDER — ENSURE COMPLETE PO LIQD
237.0000 mL | ORAL | Status: DC
  Administered 2014-04-02: 237 mL via ORAL

## 2014-04-01 NOTE — Evaluation (Signed)
Occupational Therapy Evaluation Patient Details Name: Penny [name] MRN: [id number] DOB: [birth year] Today's Date: 04/01/2014    History of Present Illness Pt admitted after syncopal episode resulting in fall with L proximal humerus fx, awaiting ortho consult.  Pt with hx of afib, recent UTI, dementia, fx of R shoulder.  Daughter reports pt has had many syncopal episodes for various reasons.   Clinical Impression   Pt presents with impaired balance, generalized weakness, impaired memory, and dependence in bathing dressing and toileting.  Prior to admission, pt could perform personal care, despite her longstanding R shoulder ROM deficits.  Instructed pt and her supportive husband in shoulder precautions, sling use and ADL keeping R shoulder immobile.  Spoke to daughter on phone who agrees that pt will need SNF level care upon discharge.  Will follow acutely.    Follow Up Recommendations  SNF;Supervision/Assistance - 24 hour    Equipment Recommendations       Recommendations for Other Services       Precautions / Restrictions Precautions Precautions: Shoulder;Fall Type of Shoulder Precautions: sling Shoulder Interventions: Shoulder sling/immobilizer Precaution Booklet Issued: Yes (comment) Precaution Comments: shoulder protocol handout reviewed with pt and husband. Required Braces or Orthoses: Sling      Mobility Bed Mobility Overal bed mobility:  (pt at EOB upon OTs arrival)                Transfers Overall transfer level: Needs assistance   Transfers: Sit to/from Stand Sit to Stand: Min assist         General transfer comment: verbal cues for hand placement and assist to rise    Balance                                            ADL Overall ADL's : Needs assistance/impaired Eating/Feeding: Set up;Sitting   Grooming: Wash/dry hands;Wash/dry face;Set up;Sitting   Upper Body Bathing: Moderate assistance;Sitting   Lower Body Bathing: Total  assistance;Sit to/from stand   Upper Body Dressing : Maximal assistance;Sitting   Lower Body Dressing: Total assistance;Sit to/from stand   Toilet Transfer: Minimal assistance;Stand-pivot;BSC   Toileting- Clothing Manipulation and Hygiene: Total assistance;Sit to/from stand       Functional mobility during ADLs: Minimal assistance;Cueing for safety (Right hand held) General ADL Comments: Instructed husband and pt in hemitechniques for bathing and dressing.  Pt is likely to be quite dependent because of R shoulder issues,     Vision                     Perception     Praxis      Pertinent Vitals/Pain No pain reported, see PT note for orthostatics     Hand Dominance Right   Extremity/Trunk Assessment Upper Extremity Assessment Upper Extremity Assessment: RUE deficits/detail;LUE deficits/detail RUE Deficits / Details: hx of shoulder fx, no functional use, elbow to hand WFL RUE Coordination: decreased gross motor LUE Deficits / Details: L shoulder immobilized due to fx, elbow to hand WFL LUE: Unable to fully assess due to immobilization LUE Coordination: decreased gross motor   Lower Extremity Assessment Lower Extremity Assessment: Defer to PT evaluation       Communication Communication Communication: No difficulties   Cognition Arousal/Alertness: Awake/alert Behavior During Therapy: WFL for tasks assessed/performed Overall Cognitive Status: History of cognitive impairments - at baseline       Memory:  Decreased recall of precautions;Decreased short-term memory             General Comments       Exercises       Shoulder Instructions      Home Living Family/patient expects to be discharged to:: Private residence Living Arrangements: Spouse/significant other Available Help at Discharge: Family;Available 24 hours/day Type of Home: House Home Access: Stairs to enter [employer] of Steps: 3 Entrance Stairs-Rails: Right;Left Home  Layout: One level     Bathroom Shower/Tub: Occupational psychologist: Standard     Home Equipment: Cane - single point;Bedside commode          Prior Functioning/Environment Level of Independence: Needs assistance    ADL's / Homemaking Assistance Needed: husband does housekeeping and meal prep, pt can sponge bathe, dress, toilet and feed herself        OT Diagnosis: Generalized weakness;Cognitive deficits   OT Problem List: Decreased strength;Decreased activity tolerance;Decreased range of motion;Impaired balance (sitting and/or standing);Decreased coordination;Decreased cognition;Decreased safety awareness;Decreased knowledge of precautions;Impaired UE functional use   OT Treatment/Interventions: Self-care/ADL training;Therapeutic activities;Patient/family education    OT Goals(Current goals can be found in the care plan section) Acute Rehab OT Goals Patient Stated Goal: drive the golf cart for her husband OT Goal Formulation: With patient Time For Goal Achievement: 04/08/14 Potential to Achieve Goals: Good ADL Goals Pt Will Perform Grooming: with min assist;standing Pt Will Transfer to Toilet: with min assist;ambulating;bedside commode (over toilet) Additional ADL Goal #1: Husband will donn and doff sling independently. Additional ADL Goal #2: Pt and husband will be knowledgeable in hemitechniques for bathing and dressing.  OT Frequency: Min 2X/week   Barriers to D/C:            Co-evaluation              End of Session Nurse Communication: Mobility status;Precautions  Activity Tolerance: Treatment limited secondary to medical complications (Comment) (pt with orthostasis, see PT note) Patient left: in chair;with call bell/phone within reach;with family/visitor present;with chair alarm set   Time: 0258-5277 OT Time Calculation (min): 38 min Charges:  OT General Charges $OT Visit: 1 Procedure OT Evaluation $Initial OT Evaluation Tier I: 1  Procedure OT Treatments $Self Care/Home Management : 8-22 mins G-Codes:    Haze Boyden Penny [name] April 28, 2014, 1:46 PM [phone]

## 2014-04-01 NOTE — Care Management Note (Addendum)
  Page 1 of 1   04/01/2014     2:47:56 PM CARE MANAGEMENT NOTE 04/01/2014  Patient:  Penny [name], Penny [name]   Account Number:  [id number]  Date Initiated:  04/01/2014  Documentation initiated by:  [facility]  Subjective/Objective Assessment:   80 yo s/p unwitnessed fall  with complaints of immediate left shoulder pain.  Patient found down by her husband.     Action/Plan:   medical workup and pain control; IV abx; IV fluids. //Access for [facility] needs vs. SNF placement   Anticipated DC Date:  04/05/2014   Anticipated DC Plan:  SKILLED NURSING FACILITY  In-house referral  Clinical Social Worker      DC Planning Services  CM consult      Choice offered to / List presented to:             Status of service:  In process, will continue to follow Medicare Important Message given?   (If response is "NO", the following Medicare IM given date fields will be blank) Date Medicare IM given:   Date Additional Medicare IM given:    Discharge Disposition:    Per UR Regulation:  Reviewed for med. necessity/level of care/duration of stay  If discussed at [place] of Stay Meetings, dates discussed:    Comments:  04/01/14 [place], RN, BSN, [place] [phone] Spoke to pt and daughter at bedside regarding discharge planning.  Daughter requesting SNF placement at [facility], as pt has been there before for rehab.  States pt can not go home in this condition and dad (pt husband) can not take care of her.  PT and OT consults in; [place] notified.  NCM will continue to follow.

## 2014-04-01 NOTE — Consult Note (Signed)
Reason for Consult:Broken left shoulder Referring Physician: Amylynn Fano is an 80 y.o. female.  HPI: 80 yo s/p unwitnessed fall yesterday with complaints of immediate left shoulder pain.  Patient found down by her husband.  Patient unable to use the shoulder pr arm due to pain.  Patient is not aware of events surrounding fall, syncope vs trip.  Pt admitted by Dr Brigitte Pulse for medical workup and pain control.  Past Medical History  Diagnosis Date  . Long-term (current) use of anticoagulants   . Chronic shoulder pain   . Heart palpitations     rare  . Memory problem     "a little short term and long term memory loss" (03/31/2014)  . Depression   . Hyperlipidemia   . Dyslipidemia   . Seasonal allergies   . Anxiety   . Hypertension   . CHF (congestive heart failure)   . Atrial fibrillation   . Humerus fracture 03/31/2014    "fell and broke left arm"  . Kidney stones   . Anginal pain   . Blood in urine     "chronic" (03/31/2014)  . Arthritis     "joints" (03/31/2014)  . Osteopenia     "severe" (03/31/2014)  . Syncope and collapse 03/31/2014    Past Surgical History  Procedure Laterality Date  . Ureteral stent placement Bilateral     for stenosis; "they've been removed"  . Removal of growth  2007    growth under kidney removed and possibly kindey stones removed  . Knee arthroscopy Right 2011  . Total shoulder arthroplasty Right 10/2007  . Nephrolithotomy Right 06/04/2013    Procedure: NEPHROLITHOTOMY PERCUTANEOUS;  Surgeon: Franchot Gallo, MD;  Location: [facility];  Service: Urology;  Laterality: Right;  . Appendectomy  ~ 1953  . Abdominal hysterectomy    . Bladder suspension      w/hysterectomy  . Cardioversion  2006  . Meniscus repair Left 07/2000    Family History  Problem Relation Age of Onset  . Heart disease Mother     Social History:  reports that she quit smoking about 32 years ago. Her smoking use included Cigarettes. She has a 1.25 pack-year smoking history.  She has never used smokeless tobacco. She reports that she does not drink alcohol or use illicit drugs.  Allergies:  Allergies  Allergen Reactions  . Zocor [Simvastatin]   . Sulfa Antibiotics Rash    Medications: I have reviewed the patient's current medications.  Results for orders placed during the hospital encounter of 03/31/14 (from the past 48 hour(s))  TROPONIN I     Status: None   Collection Time    03/31/14 12:19 PM      Result Value Ref Range   Troponin I <0.30  <0.30 ng/mL   Comment:            Due to the release kinetics of cTnI,     a negative result within the first hours     of the onset of symptoms does not rule out     myocardial infarction with certainty.     If myocardial infarction is still suspected,     repeat the test at appropriate intervals.  MAGNESIUM     Status: None   Collection Time    03/31/14 12:19 PM      Result Value Ref Range   Magnesium 1.9  1.5 - 2.5 mg/dL  DIGOXIN LEVEL     Status: Abnormal   Collection Time  03/31/14 12:19 PM      Result Value Ref Range   Digoxin Level <0.3 (*) 0.8 - 2.0 ng/mL  CBC WITH DIFFERENTIAL     Status: Abnormal   Collection Time    03/31/14 12:19 PM      Result Value Ref Range   WBC 5.7  4.0 - 10.5 K/uL   RBC 4.04  3.87 - 5.11 MIL/uL   Hemoglobin 11.9 (*) 12.0 - 15.0 g/dL   HCT 36.9  36.0 - 46.0 %   MCV 91.3  78.0 - 100.0 fL   MCH 29.5  26.0 - 34.0 pg   MCHC 32.2  30.0 - 36.0 g/dL   RDW 13.6  11.5 - 15.5 %   Platelets 114 (*) 150 - 400 K/uL   Comment: REPEATED TO VERIFY     SPECIMEN CHECKED FOR CLOTS     PLATELET COUNT CONFIRMED BY SMEAR   Neutrophils Relative % 75  43 - 77 %   Neutro Abs 4.3  1.7 - 7.7 K/uL   Lymphocytes Relative 14  12 - 46 %   Lymphs Abs 0.8  0.7 - 4.0 K/uL   Monocytes Relative 10  3 - 12 %   Monocytes Absolute 0.5  0.1 - 1.0 K/uL   Eosinophils Relative 1  0 - 5 %   Eosinophils Absolute 0.0  0.0 - 0.7 K/uL   Basophils Relative 0  0 - 1 %   Basophils Absolute 0.0  0.0 - 0.1  K/uL  BASIC METABOLIC PANEL     Status: Abnormal   Collection Time    03/31/14 12:19 PM      Result Value Ref Range   Sodium 143  137 - 147 mEq/L   Potassium 3.7  3.7 - 5.3 mEq/L   Chloride 105  96 - 112 mEq/L   CO2 24  19 - 32 mEq/L   Glucose, Bld 93  70 - 99 mg/dL   BUN 20  6 - 23 mg/dL   Creatinine, Ser 0.78  0.50 - 1.10 mg/dL   Calcium 9.3  8.4 - 10.5 mg/dL   GFR calc non Af Amer 75 (*) >90 mL/min   GFR calc Af Amer 87 (*) >90 mL/min   Comment: (NOTE)     The eGFR has been calculated using the CKD EPI equation.     This calculation has not been validated in all clinical situations.     eGFR's persistently <90 mL/min signify possible Chronic Kidney     Disease.  URINALYSIS, ROUTINE W REFLEX MICROSCOPIC     Status: Abnormal   Collection Time    03/31/14  1:17 PM      Result Value Ref Range   Color, Urine AMBER (*) YELLOW   Comment: BIOCHEMICALS MAY BE AFFECTED BY COLOR   APPearance CLOUDY (*) CLEAR   Specific Gravity, Urine 1.023  1.005 - 1.030   pH 6.0  5.0 - 8.0   Glucose, UA NEGATIVE  NEGATIVE mg/dL   Hgb urine dipstick LARGE (*) NEGATIVE   Bilirubin Urine NEGATIVE  NEGATIVE   Ketones, ur 15 (*) NEGATIVE mg/dL   Protein, ur 100 (*) NEGATIVE mg/dL   Urobilinogen, UA 1.0  0.0 - 1.0 mg/dL   Nitrite NEGATIVE  NEGATIVE   Leukocytes, UA MODERATE (*) NEGATIVE  URINE MICROSCOPIC-ADD ON     Status: Abnormal   Collection Time    03/31/14  1:17 PM      Result Value Ref Range   Squamous Epithelial / LPF  RARE  RARE   WBC, UA 21-50  <3 WBC/hpf   RBC / HPF TOO NUMEROUS TO COUNT  <3 RBC/hpf   Bacteria, UA MANY (*) RARE   Urine-Other FEW YEAST    COMPREHENSIVE METABOLIC PANEL     Status: Abnormal   Collection Time    04/01/14  4:38 AM      Result Value Ref Range   Sodium 142  137 - 147 mEq/L   Potassium 3.6 (*) 3.7 - 5.3 mEq/L   Chloride 107  96 - 112 mEq/L   CO2 22  19 - 32 mEq/L   Glucose, Bld 99  70 - 99 mg/dL   BUN 19  6 - 23 mg/dL   Creatinine, Ser 0.66  0.50 - 1.10  mg/dL   Calcium 8.8  8.4 - 10.5 mg/dL   Total Protein 5.6 (*) 6.0 - 8.3 g/dL   Albumin 2.6 (*) 3.5 - 5.2 g/dL   AST 24  0 - 37 U/L   ALT 6  0 - 35 U/L   Alkaline Phosphatase 47  39 - 117 U/L   Total Bilirubin 0.6  0.3 - 1.2 mg/dL   GFR calc non Af Amer 79 (*) >90 mL/min   GFR calc Af Amer >90  >90 mL/min   Comment: (NOTE)     The eGFR has been calculated using the CKD EPI equation.     This calculation has not been validated in all clinical situations.     eGFR's persistently <90 mL/min signify possible Chronic Kidney     Disease.  CBC     Status: Abnormal   Collection Time    04/01/14  4:38 AM      Result Value Ref Range   WBC 5.6  4.0 - 10.5 K/uL   RBC 3.42 (*) 3.87 - 5.11 MIL/uL   Hemoglobin 10.1 (*) 12.0 - 15.0 g/dL   HCT 30.4 (*) 36.0 - 46.0 %   MCV 88.9  78.0 - 100.0 fL   MCH 29.5  26.0 - 34.0 pg   MCHC 33.2  30.0 - 36.0 g/dL   RDW 13.6  11.5 - 15.5 %   Platelets 122 (*) 150 - 400 K/uL    Dg Ribs Unilateral W/chest Left  03/31/2014   CLINICAL DATA:  Fall.  EXAM: LEFT RIBS AND CHEST - 3+ VIEW  COMPARISON:  06/06/2013  FINDINGS: Normal heart size. No pleural effusion or edema. Lungs are hyperinflated but clear. Previous right shoulder arthroplasty. At comminuted fracture involving the proximal left humerus is noted. No displaced rib fractures are identified.  IMPRESSION: 1. Left humerus fracture. 2. No rib fractures identified.   Electronically Signed   By: Kerby Moors M.D.   On: 03/31/2014 13:03   Ct Head Wo Contrast  03/31/2014   CLINICAL DATA:  Fall and passed out.  EXAM: CT HEAD WITHOUT CONTRAST  TECHNIQUE: Contiguous axial images were obtained from the base of the skull through the vertex without contrast.  COMPARISON:  01/13/2013  FINDINGS: There is stable cerebral atrophy. Stable low-density in the periventricular and subcortical white matter suggesting chronic changes. No evidence for acute hemorrhage, mass lesion, midline shift, hydrocephalus or large infarct. No  acute bone abnormality. Stable lucent areas within the calvarium.  IMPRESSION: No acute intracranial abnormality.  Atrophy and evidence for chronic small vessel ischemic changes.   Electronically Signed   By: Markus Daft M.D.   On: 03/31/2014 14:49   Dg Shoulder Left  03/31/2014   CLINICAL DATA:  Fall.  Left shoulder pain  EXAM: LEFT SHOULDER - 2+ VIEW  COMPARISON:  None.  FINDINGS: The bones are diffusely osteopenic. There is a comminuted fracture involving the proximal humerus at the level of the surgical neck. Mild varus angulation of the distal fracture fragments noted.  IMPRESSION: 1. Comminuted fracture involves the proximal left humerus.   Electronically Signed   By: Kerby Moors M.D.   On: 03/31/2014 13:00    ROS Blood pressure 116/48, pulse 99, temperature 97.6 F (36.4 C), temperature source Oral, resp. rate 18, height 5' 4"  (1.626 m), weight 56.972 kg (125 lb 9.6 oz), SpO2 97.00%. Physical Exam  Awake, alert, sitting up in a chair in no apparent distress, right shoulder with atrophy of the deltoid and limited shoulder and arm function.  Left shoulder swollen and tender, unable to assess ROM due to fracture,  Elbow and wrist nontender and not swollen.  NVI left UE  Assessment/Plan: Comminuted, minimally displaced left proximal humerus fracture with acceptable alignment.  Plan non surgical care for this fracture with use of sling while up.  Keep arm positioned at her side(hug self position.)  Will not be able to do any ROM or therapy with the shoulder for four weeks. I would like to see Ms Hildebrandt back in the office in 2 weeks for follow up xrays.  This injury will significantly impact her balance and her independence especially given her long standing limitations with the right shoulder.  Thanks for allowing [place] to see her.  Augustin Schooling 04/01/2014, 1:39 PM  [phone]

## 2014-04-01 NOTE — Progress Notes (Signed)
INITIAL NUTRITION ASSESSMENT  DOCUMENTATION CODES Per approved criteria  -Not Applicable   INTERVENTION: Provide Magic Cup ice cream once daily Provide Ensure Complete once daily Encourage PO intake  NUTRITION DIAGNOSIS: Inadequate oral intake related to varied appetite as evidenced by pt/family report and moderate muscle wasting.  Goal: Pt to meet >/= 90% of their estimated nutrition needs   Monitor:  PO intake, weight trend, labs  Reason for Assessment: Malnutrition Screening Tool  80 y.o. female  Admitting Dx: Syncope and collapse  ASSESSMENT: 80 yo s/p unwitnessed fall yesterday with complaints of immediate left shoulder pain. Patient found down by her husband. Patient unable to use the shoulder pr arm due to pain. Patient is not aware of events surrounding fall, syncope vs trip.  Per H&P, about 2 weeks ago pt had a urinary tract infection was treated with antibiotics, and since that time she's had very little food and fluid and gradually worsening .  Pt confused at time of visit. Family at bedside assisted in providing pt history. Per husband pt doesn't typically eat well and her appetite varies day to day. Husband thinks pt has lost some weight but, unsure how much. Weight history shows pt's weight has been stable. Pt with moderate wasting per physical exam. Husband reports he purchased pt some Boost nutritional supplements but, pt has not been drinking them. Pt interested in trying Magic Cup and Ensure- RD to order. Per nursing notes pt ate 65-85% of meals today. Encouraged PO intake. Encouraged use of nutrition supplements on days when pt doesn't eat well, to promote healing and improve strength.   Nutrition Focused Physical Exam:  Subcutaneous Fat:  Orbital Region: wnl Upper Arm Region: mild wasting Thoracic and Lumbar Region: NA  Muscle:  Temple Region: moderate wasting Clavicle Bone Region: moderate wasting Clavicle and Acromion Bone Region: mild  wasting Scapular Bone Region: NA Dorsal Hand: moderate wasting Patellar Region: moderate wasting Anterior Thigh Region: moderate wasting Posterior Calf Region: wnl  Edema: none noted  Height: Ht Readings from Last 1 Encounters:  03/31/14 5\' 4"  (1.626 m)    Weight: Wt Readings from Last 1 Encounters:  03/31/14 125 lb 9.6 oz (56.972 kg)    Ideal Body Weight: 120 lbs  % Ideal Body Weight: 104%  Wt Readings from Last 10 Encounters:  03/31/14 125 lb 9.6 oz (56.972 kg)  10/26/13 121 lb 12.8 oz (55.248 kg)  06/24/13 118 lb 9.6 oz (53.797 kg)  06/04/13 120 lb (54.432 kg)  06/04/13 120 lb (54.432 kg)  06/04/13 120 lb (54.432 kg)  05/27/13 120 lb (54.432 kg)  02/23/13 118 lb 12.8 oz (53.887 kg)  01/14/13 122 lb 4.8 oz (55.475 kg)  10/28/12 121 lb 12.8 oz (55.248 kg)    Usual Body Weight: approximately 120 lbs (per weight history)  % Usual Body Weight: 104%  BMI:  Body mass index is 21.55 kg/(m^2).  Estimated Nutritional Needs: Kcal: 1400-1600 Protein: 65-75 grams Fluid: 1.4-1.6 L/day  Skin: intact  Diet Order: General  EDUCATION NEEDS: -No education needs identified at this time   Intake/Output Summary (Last 24 hours) at 04/01/14 1615 Last data filed at 04/01/14 1531  Gross per 24 hour  Intake   2210 ml  Output     50 ml  Net   2160 ml    Last BM: 5/12   Labs:   Recent Labs Lab 03/31/14 1219 04/01/14 0438  NA 143 142  K 3.7 3.6*  CL 105 107  CO2 24 22  BUN  20 19  CREATININE 0.78 0.66  CALCIUM 9.3 8.8  MG 1.9  --   GLUCOSE 93 99    CBG (last 3)  No results found for this basename: GLUCAP,  in the last 72 hours  Scheduled Meds: . atorvastatin  10 mg Oral q1800  . cefTRIAXone (ROCEPHIN)  IV  1 g Intravenous Q24H  . cholecalciferol  5,000 Units Oral Daily  . digoxin  0.125 mg Oral Once per day on Mon Wed Fri  . donepezil  10 mg Oral QHS  . metoprolol tartrate  12.5 mg Oral q morning - 10a  . multivitamin with minerals  1 tablet Oral Daily   . rivaroxaban  20 mg Oral Q supper  . sodium chloride  3 mL Intravenous Q12H  . vitamin C  500 mg Oral Daily    Continuous Infusions: . 0.9 % NaCl with KCl 20 mEq / L 100 mL/hr at 04/01/14 1200    Past Medical History  Diagnosis Date  . Long-term (current) use of anticoagulants   . Chronic shoulder pain   . Heart palpitations     rare  . Memory problem     "a little short term and long term memory loss" (03/31/2014)  . Depression   . Hyperlipidemia   . Dyslipidemia   . Seasonal allergies   . Anxiety   . Hypertension   . CHF (congestive heart failure)   . Atrial fibrillation   . Humerus fracture 03/31/2014    "fell and broke left arm"  . Kidney stones   . Anginal pain   . Blood in urine     "chronic" (03/31/2014)  . Arthritis     "joints" (03/31/2014)  . Osteopenia     "severe" (03/31/2014)  . Syncope and collapse 03/31/2014    Past Surgical History  Procedure Laterality Date  . Ureteral stent placement Bilateral     for stenosis; "they've been removed"  . Removal of growth  2007    growth under kidney removed and possibly kindey stones removed  . Knee arthroscopy Right 2011  . Total shoulder arthroplasty Right 10/2007  . Nephrolithotomy Right 06/04/2013    Procedure: NEPHROLITHOTOMY PERCUTANEOUS;  Surgeon: Franchot Gallo, MD;  Location: [facility];  Service: Urology;  Laterality: Right;  . Appendectomy  ~ 1953  . Abdominal hysterectomy    . Bladder suspension      w/hysterectomy  . Cardioversion  2006  . Meniscus repair Left 07/2000    Pryor Ochoa RD, LDN Inpatient Clinical Dietitian Pager: [phone] After Hours Pager: [phone]

## 2014-04-01 NOTE — Evaluation (Addendum)
Physical Therapy Evaluation Patient Details Name: Penny [name] MRN: [id number] DOB: August 06, 1929 Today's Date: 04/01/2014   History of Present Illness  Pt admitted after syncopal episode resulting in fall with L proximal humerus fx; ortho consult states no plans for surgery.  Pt with hx of afib, recent UTI, dementia, pt fell 2009 with R shoulder fx requiring replacement.  Daughter reports pt has had many syncopal episodes for various reasons.  Clinical Impression  Pt admitted with fall and Lt shoulder fracture. Pt currently with functional limitations due to the deficits listed below (see PT Problem List).  Pt will benefit from skilled PT to increase their independence and safety with mobility to allow discharge to the venue listed below. Per OT conversation with pt's daughter, family feels strongly that husband cannot manage pt at home now that pt has no use of LUE and very limited RUE use (from prior surgery).       Follow Up Recommendations SNF;Supervision/Assistance - 24 hour    Equipment Recommendations  None recommended by PT    Recommendations for Other Services       Precautions / Restrictions Precautions Precautions: Shoulder;Fall Type of Shoulder Precautions: sling Shoulder Interventions: Shoulder sling/immobilizer Precaution Booklet Issued: Yes (comment) Precaution Comments: shoulder protocol handout reviewed with pt and husband. Required Braces or Orthoses: Sling Restrictions Weight Bearing Restrictions: Yes LUE Weight Bearing: Non weight bearing Other Position/Activity Restrictions: No MD order, but assumed due to fx      Mobility  Bed Mobility Overal bed mobility: Needs Assistance Bed Mobility: Supine to Sit     Supine to sit: Mod assist;HOB elevated     General bed mobility comments: HOB elevated for comfort and incr ease to move to EOB; pt required step-by-step cues and assist to raise torso and scoot to EOB in sitting  Transfers Overall transfer level:  Needs assistance Equipment used: 1 person hand held assist Transfers: Sit to/from [employer] Transfers Sit to Stand: Min assist Stand pivot transfers: Min assist       General transfer comment: verbal cues for hand placement and assist to rise  Ambulation/Gait Ambulation/Gait assistance: Min assist Ambulation Distance (Feet): 3 Feet Assistive device: 1 person hand held assist Gait Pattern/deviations: Step-through pattern;Decreased stride length;Trunk flexed     General Gait Details: limited distance due to hypotension upon standing (although denied dizziness); pt reaching for environmental support with LUE and required cues not to use LUE  Stairs            Wheelchair Mobility    Modified Rankin (Stroke Patients Only)       Balance Overall balance assessment: Needs assistance Sitting-balance support: No upper extremity supported;Feet supported Sitting balance-Leahy Scale: Fair Sitting balance - Comments: EOB at total of 10 minutes, preparing for transfer    Standing balance support: Single extremity supported Standing balance-Leahy Scale: Poor Standing balance comment: flexed posture; reaching for support                             Pertinent Vitals/Pain Pain Lt shoulder with any mobility (while UE in sling); pt unable to rate; grimaces, but once still denies pain +orthostasis with activity See vitals flow sheet.     Home Living Family/patient expects to be discharged to:: Private residence Living Arrangements: Spouse/significant other Available Help at Discharge: Family;Available 24 hours/day Type of Home: House Home Access: Stairs to enter Entrance Stairs-Rails: Psychiatric nurse of Steps: 3 Home Layout: One level Home  Equipment: Cane - single point;Bedside commode      Prior Function Level of Independence: Needs assistance   Gait / Transfers Assistance Needed: has been independent with walking; last fall was  2009  ADL's / Homemaking Assistance Needed: husband does housekeeping and meal prep, pt can sponge bathe, dress, toilet and feed herself        Hand Dominance   Dominant Hand: Right    Extremity/Trunk Assessment   Upper Extremity Assessment: Defer to OT evaluation RUE Deficits / Details: hx of shoulder fx, no functional use, elbow to hand WFL     LUE Deficits / Details: L shoulder immobilized due to fx, elbow to hand [facility]   Lower Extremity Assessment: Generalized weakness      Cervical / Trunk Assessment: Normal  Communication   Communication: No difficulties  Cognition Arousal/Alertness: Awake/alert Behavior During Therapy: WFL for tasks assessed/performed Overall Cognitive Status: History of cognitive impairments - at baseline       Memory: Decreased recall of precautions;Decreased short-term memory              General Comments General comments (skin integrity, edema, etc.): husband present throughout session; he is unsure if they will be able to manage at home    Exercises        Assessment/Plan    PT Assessment Patient needs continued PT services  PT Diagnosis Difficulty walking;Acute pain   PT Problem List Decreased strength;Decreased activity tolerance;Decreased balance;Decreased mobility;Decreased cognition;Decreased knowledge of use of DME;Decreased safety awareness;Decreased knowledge of precautions;Cardiopulmonary status limiting activity;Pain  PT Treatment Interventions DME instruction;Stair training;Gait training;Functional mobility training;Therapeutic activities;Therapeutic exercise;Balance training;Cognitive remediation;Patient/family education   PT Goals (Current goals can be found in the Care Plan section) Acute Rehab PT Goals Patient Stated Goal: drive the golf cart for her husband Time For Goal Achievement: 04/15/14 Potential to Achieve Goals: Good    Frequency Min 2X/week   Barriers to discharge Decreased caregiver support husband  also elderly and ?can provide necessary assist    Co-evaluation PT/OT/SLP Co-Evaluation/Treatment: Yes Reason for Co-Treatment: For patient/therapist safety PT goals addressed during session: Mobility/safety with mobility;Balance         End of Session Equipment Utilized During Treatment: Gait belt;Other (comment) (LUE sling) Activity Tolerance: Treatment limited secondary to medical complications (Comment) (orthostatic hypotension) Patient left: in chair;with call bell/phone within reach;with chair alarm set;with family/visitor present Nurse Communication: Mobility status;Other (comment) (orthostasis)         Time: 8338-2505 PT Time Calculation (min): 61 min   Charges:   PT Evaluation $Initial PT Evaluation Tier I: 1 Procedure PT Treatments $Therapeutic Activity: 23-37 mins   PT G CodesJeanie Cooks Alycia Cooperwood 2014-04-17, 2:51 PM Pager [phone]

## 2014-04-01 NOTE — Progress Notes (Signed)
Subjective: Events reviewed- although she does not remember the fall she has Dementia with short-term memory deficits.  Does not recall chest pain, palpitations.  Has been weak recently since UTI treated at Urgent Care.  Pain controlled this am with left arm sling.   Objective: Vital signs in last 24 hours: Temp:  [97.7 F (36.5 C)-98 F (36.7 C)] 97.9 F (36.6 C) (05/14 0642) Pulse Rate:  [67-100] 100 (05/14 0642) Resp:  [14-26] 17 (05/14 0642) BP: (97-135)/(54-85) 110/55 mmHg (05/14 0642) SpO2:  [92 %-100 %] 96 % (05/14 0642) Weight:  [56.972 kg (125 lb 9.6 oz)] 56.972 kg (125 lb 9.6 oz) (05/13 1911) Weight change:     CBG (last 3)  No results found for this basename: GLUCAP,  in the last 72 hours  Intake/Output from previous day: 05/13 0701 - 05/14 0700 In: 830 [P.O.:240; I.V.:590] Out: 50 [Urine:50] Intake/Output this shift:    General appearance: alert and no distress Eyes: no scleral icterus Throat: oropharynx moist without erythema Resp: clear to auscultation bilaterally Cardio: irregularly irregular rhythm GI: soft, non-tender; bowel sounds normal; no masses,  no organomegaly Extremities: no clubbing, cyanosis or edema; left arm sling; pulses intact, hand warm   Lab Results:  Recent Labs  03/31/14 1219 04/01/14 0438  NA 143 142  K 3.7 3.6*  CL 105 107  CO2 24 22  GLUCOSE 93 99  BUN 20 19  CREATININE 0.78 0.66  CALCIUM 9.3 8.8  MG 1.9  --     Recent Labs  04/01/14 0438  AST 24  ALT 6  ALKPHOS 47  BILITOT 0.6  PROT 5.6*  ALBUMIN 2.6*    Recent Labs  03/31/14 1219 04/01/14 0438  WBC 5.7 5.6  NEUTROABS 4.3  --   HGB 11.9* 10.1*  HCT 36.9 30.4*  MCV 91.3 88.9  PLT 114* 122*   Lab Results  Component Value Date   INR 1.14 06/04/2013   INR 1.75* 05/27/2013   INR 1.93* 01/14/2013    Recent Labs  03/31/14 1219  TROPONINI <0.30   No results found for this basename: TSH, T4TOTAL, FREET3, T3FREE, THYROIDAB,  in the last 72 hours No  results found for this basename: VITAMINB12, FOLATE, FERRITIN, TIBC, IRON, RETICCTPCT,  in the last 72 hours  Studies/Results: Dg Ribs Unilateral W/chest Left  03/31/2014   CLINICAL DATA:  Fall.  EXAM: LEFT RIBS AND CHEST - 3+ VIEW  COMPARISON:  06/06/2013  FINDINGS: Normal heart size. No pleural effusion or edema. Lungs are hyperinflated but clear. Previous right shoulder arthroplasty. At comminuted fracture involving the proximal left humerus is noted. No displaced rib fractures are identified.  IMPRESSION: 1. Left humerus fracture. 2. No rib fractures identified.   Electronically Signed   By: Kerby Moors M.D.   On: 03/31/2014 13:03   Ct Head Wo Contrast  03/31/2014   CLINICAL DATA:  Fall and passed out.  EXAM: CT HEAD WITHOUT CONTRAST  TECHNIQUE: Contiguous axial images were obtained from the base of the skull through the vertex without contrast.  COMPARISON:  01/13/2013  FINDINGS: There is stable cerebral atrophy. Stable low-density in the periventricular and subcortical white matter suggesting chronic changes. No evidence for acute hemorrhage, mass lesion, midline shift, hydrocephalus or large infarct. No acute bone abnormality. Stable lucent areas within the calvarium.  IMPRESSION: No acute intracranial abnormality.  Atrophy and evidence for chronic small vessel ischemic changes.   Electronically Signed   By: Markus Daft M.D.   On: 03/31/2014 14:49  Dg Shoulder Left  03/31/2014   CLINICAL DATA:  Fall.  Left shoulder pain  EXAM: LEFT SHOULDER - 2+ VIEW  COMPARISON:  None.  FINDINGS: The bones are diffusely osteopenic. There is a comminuted fracture involving the proximal humerus at the level of the surgical neck. Mild varus angulation of the distal fracture fragments noted.  IMPRESSION: 1. Comminuted fracture involves the proximal left humerus.   Electronically Signed   By: Kerby Moors M.D.   On: 03/31/2014 13:00     Medications: Scheduled: . atorvastatin  10 mg Oral q1800  . cefTRIAXone  (ROCEPHIN)  IV  1 g Intravenous Q24H  . cholecalciferol  5,000 Units Oral Daily  . digoxin  0.125 mg Oral Once per day on Mon Wed Fri  . donepezil  10 mg Oral QHS  . metoprolol tartrate  12.5 mg Oral q morning - 10a  . multivitamin with minerals  1 tablet Oral Daily  . rivaroxaban  20 mg Oral Q supper  . sodium chloride  3 mL Intravenous Q12H  . vitamin C  500 mg Oral Daily   Continuous: . 0.9 % NaCl with KCl 20 mEq / L 100 mL/hr at 03/31/14 2210    Assessment/Plan: Principal Problem: 1.  Fall/possible syncope- unclear whether she had a true syncopal episode or if Dementia limits her recollection of events.  Will continue telemetry but do not plan to repeat Echo (EF 55-60%, no AS in 2/14) or carotids (no stenosis in 2/14).  CT shows no acute findings.  Continue IV fluids for mild volume depletion.  Check orthostatics.  Active Problems: 2. Left Humerus fracture- ortho consult pending.  Unfortunately, she has limited function of right shoulder due to prior fracture.   3. Atrial fibrillation- continue rate control and anticoagulation. 4. UTI- Klebsiella- sensitivities pending.  Continue Rocephin pending sensitivities. 5. Alzheimer's Dementia- impacts recollection of events surrounding fall.  Continue Aricept. 6. Disposition- PT/OT consults.  Daughter feels she may need SNF rehab prior to return home.  She may be ok with 24 hour care.  Possible discharge tomorrow if no surgery planned.     LOS: 1 day   Penny [name] 04/01/2014, 7:55 AM

## 2014-04-02 LAB — BASIC METABOLIC PANEL
BUN: 15 mg/dL (ref 6–23)
CALCIUM: 8.6 mg/dL (ref 8.4–10.5)
CO2: 21 mEq/L (ref 19–32)
CREATININE: 0.64 mg/dL (ref 0.50–1.10)
Chloride: 108 mEq/L (ref 96–112)
GFR calc Af Amer: >90 mL/min (ref >90)
GFR, EST NON AFRICAN AMERICAN: 80 mL/min — AB (ref >90)
GLUCOSE: 101 mg/dL — AB (ref 70–99)
Potassium: 3.8 mEq/L (ref 3.7–5.3)
Sodium: 139 mEq/L (ref 137–147)

## 2014-04-02 LAB — CBC
HCT: 30.1 % — ABNORMAL LOW (ref 36.0–46.0)
Hemoglobin: 9.8 g/dL — ABNORMAL LOW (ref 12.0–15.0)
MCH: 29.1 pg (ref 26.0–34.0)
MCHC: 32.6 g/dL (ref 30.0–36.0)
MCV: 89.3 fL (ref 78.0–100.0)
Platelets: 128 10*3/uL — ABNORMAL LOW (ref 150–400)
RBC: 3.37 MIL/uL — AB (ref 3.87–5.11)
RDW: 13.6 % (ref 11.5–15.5)
WBC: 5.2 10*3/uL (ref 4.0–10.5)

## 2014-04-02 MED ORDER — ENSURE COMPLETE PO LIQD: 237.0000 mL | ORAL | Status: DC

## 2014-04-02 MED ORDER — CIPROFLOXACIN HCL 500 MG PO TABS: 500.0000 mg | ORAL_TABLET | Freq: Two times a day (BID) | ORAL | Status: AC

## 2014-04-02 MED ORDER — DOCUSATE SODIUM 100 MG PO CAPS
100.0000 mg | ORAL_CAPSULE | Freq: Two times a day (BID) | ORAL | Status: DC
  Administered 2014-04-02: 100 mg via ORAL
  Filled 2014-04-02 (×2): qty 1

## 2014-04-02 MED ORDER — MIRTAZAPINE 15 MG PO TABS: 15.0000 mg | ORAL_TABLET | Freq: Every day | ORAL | Status: DC

## 2014-04-02 MED ORDER — DSS 100 MG PO CAPS: 100.0000 mg | ORAL_CAPSULE | Freq: Two times a day (BID) | ORAL | Status: DC

## 2014-04-02 MED ORDER — HYDROCODONE-ACETAMINOPHEN 5-325 MG PO TABS: 1.0000 | ORAL_TABLET | ORAL | Status: DC | PRN

## 2014-04-02 MED ORDER — BISACODYL 10 MG RE SUPP
10.0000 mg | Freq: Every day | RECTAL | Status: DC | PRN
  Administered 2014-04-02: 10 mg via RECTAL
  Filled 2014-04-02: qty 1

## 2014-04-02 MED ORDER — POLYETHYLENE GLYCOL 3350 17 G PO PACK
17.0000 g | PACK | Freq: Every day | ORAL | Status: DC | PRN
  Filled 2014-04-02: qty 1

## 2014-04-02 MED ORDER — MIRTAZAPINE 15 MG PO TABS
15.0000 mg | ORAL_TABLET | Freq: Every day | ORAL | Status: DC
  Filled 2014-04-02: qty 1

## 2014-04-02 MED ORDER — POLYETHYLENE GLYCOL 3350 17 G PO PACK: 17.0000 g | PACK | Freq: Every day | ORAL | Status: DC | PRN

## 2014-04-02 NOTE — Progress Notes (Addendum)
0700-1300 Pt.is A/Ox1 and is ambulatory with 1 person assist to the bedside commode. Pt.heart rate had increased early this am to 130's-140's and appeared to be sustaining at rest. Pt.daughter was present in the room. Pt.was asymptomatic. Vital signs were taken. After vital signs were taken HR began to lower to the 110's-120's. Dr.Shaw was called and notified. Told to give medications for HR and monitor again after an hour if the HR is 120's or greater sustaining then call again. HR lowered to 90's-low 100's. Dr.Shaw was called again and notified about pt.having constipation this am. New orders were given for dulcolax suppository, Miralax, and colace. Pt.was given suppository. Later when patient was trying to have a bm she appeared to have stool stuck in her rectum and had to be disimpacted. Small amount of stool was disimpacted however it couldn't be completed because she began to have a scant amount of rectal bleeding trickling down. Dr.Shaw called and notified. Was told to continue to monitor patient for any increased bleeding and that discharge would be delayed for later today to monitor for bleeding. Social worker called and notified that patient would be a late discharge.

## 2014-04-02 NOTE — Discharge Instructions (Signed)
Keep the left arm held close to the body,  Use the sling at all times when up and about. No ROM of the left arm, no WB left UE  High Fall risk  Follow up in two weeks with Dr Veverly Fells  [phone] Humerus Fracture, Treated with Immobilization The humerus is the large bone in your upper arm. You have a broken (fractured) humerus. These fractures are easily diagnosed with X-rays. TREATMENT  Simple fractures which will heal without disability are treated with simple immobilization. Immobilization means you will wear a cast, splint, or sling. You have a fracture which will do well with immobilization. The fracture will heal well simply by being held in a good position until it is stable enough to begin range of motion exercises. Do not take part in activities which would further injure your arm.  HOME CARE INSTRUCTIONS   Put ice on the injured area.  Put ice in a plastic bag.  Place a towel between your skin and the bag.  Leave the ice on for 15-20 minutes, 03-04 times a day.  If you have a cast:  Do not scratch the skin under the cast using sharp or pointed objects.  Check the skin around the cast every day. You may put lotion on any red or sore areas.  Keep your cast dry and clean.  If you have a splint:  Wear the splint as directed.  Keep your splint dry and clean.  You may loosen the elastic around the splint if your fingers become numb, tingle, or turn cold or blue.  If you have a sling:  Wear the sling as directed.  Do not put pressure on any part of your cast or splint until it is fully hardened.  Your cast or splint can be protected during bathing with a plastic bag. Do not lower the cast or splint into water.  Only take over-the-counter or prescription medicines for pain, discomfort, or fever as directed by your caregiver.  Do range of motion exercises as instructed by your caregiver.  Follow up as directed by your caregiver. This is very important in order to avoid  permanent injury or disability and chronic pain. SEEK IMMEDIATE MEDICAL CARE IF:   Your skin or nails in the injured arm turn blue or gray.  Your arm feels cold or numb.  You develop severe pain in the injured arm.  You are having problems with the medicines you were given. MAKE SURE YOU:   Understand these instructions.  Will watch your condition.  Will get help right away if you are not doing well or get worse. Document Released: 02/11/2001 Document Revised: 01/28/2012 Document Reviewed: 12/20/2010 [facility] Patient Information 2014 [place].

## 2014-04-02 NOTE — Discharge Summary (Signed)
DISCHARGE SUMMARY  Penny [name]  MR#: [id number]  DOB:[birth year]  Date of Admission: 03/31/2014 Date of Discharge: 04/02/2014  Attending [place]  Patient's ASN:KNLZ, Penny Saxon, MD  Consults:Treatment Team:  Augustin Schooling, MD- Orthopaedics  Discharge Diagnoses: Principal Problem:   Possible Syncope and collapse Active Problems:   Atrial fibrillation   Long term current use of anticoagulant   UTI (urinary tract infection)   Humerus fracture   Alzheimer's Dementia  Past Medical History  Diagnosis Date  . Long-term (current) use of anticoagulants   . Chronic shoulder pain   . Heart palpitations     rare  . Memory problem     "a little short term and long term memory loss" (03/31/2014)  . Depression   . Hyperlipidemia   . Dyslipidemia   . Seasonal allergies   . Anxiety   . Hypertension   . CHF (congestive heart failure)   . Atrial fibrillation   . Humerus fracture 03/31/2014    "fell and broke left arm"  . Kidney stones   . Anginal pain   . Blood in urine     "chronic" (03/31/2014)  . Arthritis     "joints" (03/31/2014)  . Osteopenia     "severe" (03/31/2014)  . Syncope and collapse 03/31/2014   Past Surgical History  Procedure Laterality Date  . Ureteral stent placement Bilateral     for stenosis; "they've been removed"  . Removal of growth  2007    growth under kidney removed and possibly kindey stones removed  . Knee arthroscopy Right 2011  . Total shoulder arthroplasty Right 10/2007  . Nephrolithotomy Right 06/04/2013    Procedure: NEPHROLITHOTOMY PERCUTANEOUS;  Surgeon: Franchot Gallo, MD;  Location: [facility];  Service: Urology;  Laterality: Right;  . Appendectomy  ~ 1953  . Abdominal hysterectomy    . Bladder suspension      w/hysterectomy  . Cardioversion  2006  . Meniscus repair Left 07/2000     Discharge Medications:   Medication List         ciprofloxacin 500 MG tablet  Commonly known as:  CIPRO  Take 1 tablet (500 mg total)  by mouth 2 (two) times daily.     CRESTOR 5 MG tablet  Generic drug:  rosuvastatin  take 1 tablet by mouth at bedtime     diazepam 5 MG tablet  Commonly known as:  VALIUM  Take 2.5-5 mg by mouth every 12 (twelve) hours as needed for anxiety.     digoxin 0.125 MG tablet  Commonly known as:  LANOXIN  Take 0.125 mg by mouth 3 (three) times a week. 1 pill on Monday, Wednesday, and Friday     donepezil 10 MG tablet  Commonly known as:  ARICEPT  Take 10 mg by mouth at bedtime.     DSS 100 MG Caps  Take 100 mg by mouth 2 (two) times daily.     feeding supplement (ENSURE COMPLETE) Liqd  Take 237 mLs by mouth daily.     FISH OIL PO  Take 1 capsule by mouth daily.     HYDROcodone-acetaminophen 5-325 MG per tablet  Commonly known as:  NORCO/VICODIN  Take 1-2 tablets by mouth every 4 (four) hours as needed for moderate pain.     meclizine 25 MG tablet  Commonly known as:  ANTIVERT  Take 25 mg by mouth 3 (three) times daily as needed for dizziness.     metoprolol tartrate 25 MG tablet  Commonly known as:  LOPRESSOR  Take 12.5 mg by mouth every morning.     mirtazapine 15 MG tablet  Commonly known as:  REMERON  Take 1 tablet (15 mg total) by mouth at bedtime.     multivitamin with minerals Tabs tablet  Take 1 tablet by mouth daily.     ondansetron 4 MG tablet  Commonly known as:  ZOFRAN  Take 1 tablet (4 mg total) by mouth every 8 (eight) hours as needed for nausea or vomiting.     polyethylene glycol packet  Commonly known as:  MIRALAX / GLYCOLAX  Take 17 g by mouth daily as needed for mild constipation or moderate constipation.     vitamin C 500 MG tablet  Commonly known as:  ASCORBIC ACID  Take 500 mg by mouth daily.     Vitamin D-3 5000 UNITS Tabs  Take 5,000 Units by mouth daily.     XARELTO 20 MG Tabs tablet  Generic drug:  rivaroxaban  Take 1 tablet (20 mg total) by mouth daily.        Hospital Procedures: Dg Ribs Unilateral W/chest Left  03/31/2014    CLINICAL DATA:  Fall.  EXAM: LEFT RIBS AND CHEST - 3+ VIEW  COMPARISON:  06/06/2013  FINDINGS: Normal heart size. No pleural effusion or edema. Lungs are hyperinflated but clear. Previous right shoulder arthroplasty. At comminuted fracture involving the proximal left humerus is noted. No displaced rib fractures are identified.  IMPRESSION: 1. Left humerus fracture. 2. No rib fractures identified.   Electronically Signed   By: Kerby Moors M.D.   On: 03/31/2014 13:03   Ct Head Wo Contrast  03/31/2014   CLINICAL DATA:  Fall and passed out.  EXAM: CT HEAD WITHOUT CONTRAST  TECHNIQUE: Contiguous axial images were obtained from the base of the skull through the vertex without contrast.  COMPARISON:  01/13/2013  FINDINGS: There is stable cerebral atrophy. Stable low-density in the periventricular and subcortical white matter suggesting chronic changes. No evidence for acute hemorrhage, mass lesion, midline shift, hydrocephalus or large infarct. No acute bone abnormality. Stable lucent areas within the calvarium.  IMPRESSION: No acute intracranial abnormality.  Atrophy and evidence for chronic small vessel ischemic changes.   Electronically Signed   By: Markus Daft M.D.   On: 03/31/2014 14:49   Dg Shoulder Left  03/31/2014   CLINICAL DATA:  Fall.  Left shoulder pain  EXAM: LEFT SHOULDER - 2+ VIEW  COMPARISON:  None.  FINDINGS: The bones are diffusely osteopenic. There is a comminuted fracture involving the proximal humerus at the level of the surgical neck. Mild varus angulation of the distal fracture fragments noted.  IMPRESSION: 1. Comminuted fracture involves the proximal left humerus.   Electronically Signed   By: Kerby Moors M.D.   On: 03/31/2014 13:00    History of Present Illness: The patient is an 80 year old Caucasian woman with several medical problems who had an unwitnessed fall earlier today onto her left shoulder. She had immediate pain and was transported to the emergency room where a proximal  left humerus fracture was diagnosed. She is also felt to have syncope causing her fall as she has little recall of the events around the fall. She has not had palpitations, although she has chronic atrial fibrillation. She has not had any recent chest discomfort or shortness of breath. About 2 weeks ago she had a urinary tract infection was treated with antibiotics, and since that time she's had very little food and fluid and gradually worsening  fatigue. She has a history of syncope in the past with workup by her cardiologist unremarkable. She has chronic atrial fibrillation since at least 2012 with failed cardioversion, for which she is on beta blocker treatment and Xarelto for stroke prevention. Her medical history is also significant for mild cognitive deficits, osteoarthritis, kidney stones, and a fall with right shoulder fracture in 2009 leading to total shoulder arthroplasty.  Hospital Course: Penny [name] was admitted to a telemetry bed.  After reviewing her history it was less clear that she had a true syncopal episode.  There was no known loss of consciousness and her dementia makes it difficult to know the true events surrounding the fall.  She does have a remote history of syncope several years ago.  No recurrence since that time.  She had an echocardiogram and carotid Dopplers done within the past year that showed no significant aortic stenosis or carotid stenosis.  These studies were not repeated. She was provided IV fluid hydration for mild dehydration. She had no events on telemetry.  Orthostatics were negative.  She denied any lightheadedness or dizziness.  For her left proximal humerus fracture.  She was evaluated by orthopedics who recommended nonoperative management.  They recommended pain control and sling immobilizatoin.  Dr. Veverly Fells will see her in the next 2 weeks for reevaluation.  She did have a Klebsiella UTI which was treated with Rocephin.  Cultures have now returned showing  sensitivity to Cipro.  Will change to this to complete a seven-day course.  She was evaluated by physical therapy and occupational therapy.  Given her limited mobility with her right shoulder and now the immobility of her left shoulder .  Her family did not feel that she was capable of returning home.  She is at extremely high fall risk due to this immobility.  Arrangements are being made for her to be discharged to a skilled nursing facility for rehabilitation.  Since this was her first fall, I'm comfortable continuing anticoagulation for atrial fibrillation.  At this point.  However, if recurrent falls occur, we'll need to reconsider.  Day of Discharge Exam BP 123/73  Pulse 102  Temp(Src) 98.1 F (36.7 C) (Oral)  Resp 18  Ht 5\' 4"  (1.626 m)  Wt 75.025 kg (165 lb 6.4 oz)  BMI 28.38 kg/m2  SpO2 97%  Physical Exam: General appearance: alert, no distress and mild confusion  Eyes: no scleral icterus  Throat: oropharynx moist without erythema  Resp: clear to auscultation bilaterally  Cardio: irregularly irregular rhythm  GI: soft, non-tender; bowel sounds normal; no masses, no organomegaly  Extremities: no clubbing, cyanosis or edema; left arm sling; pulses intact  Discharge Labs:  Recent Labs  03/31/14 1219 04/01/14 0438 04/02/14 0432  NA 143 142 139  K 3.7 3.6* 3.8  CL 105 107 108  CO2 24 22 21   GLUCOSE 93 99 101*  BUN 20 19 15   CREATININE 0.78 0.66 0.64  CALCIUM 9.3 8.8 8.6  MG 1.9  --   --     Recent Labs  04/01/14 0438  AST 24  ALT 6  ALKPHOS 47  BILITOT 0.6  PROT 5.6*  ALBUMIN 2.6*    Recent Labs  03/31/14 1219 04/01/14 0438 04/02/14 0432  WBC 5.7 5.6 5.2  NEUTROABS 4.3  --   --   HGB 11.9* 10.1* 9.8*  HCT 36.9 30.4* 30.1*  MCV 91.3 88.9 89.3  PLT 114* 122* 128*   Lab Results  Component Value Date   INR 1.14  06/04/2013   INR 1.75* 05/27/2013   INR 1.93* 01/14/2013    Recent Labs  03/31/14 1219  TROPONINI <0.30    Discharge instructions:      Discharge Instructions   Diet - low sodium heart healthy    Complete by:  As directed      Discharge instructions    Complete by:  As directed   Maintain left arm in sling.  Continue PT/OT     Increase activity slowly    Complete by:  As directed            Disposition: to SNF Acute Rehabilitation  Follow-up Appts: Follow-up with Dr. Brigitte Pulse at [facility] within 1 week of discharge from [place] on Discharge: Stable  Tests Needing Follow-up: none  Time with discharge activities: 40 minutes  Signed: Marton Redwood 04/02/2014, 12:42 PM

## 2014-04-02 NOTE — Progress Notes (Signed)
Orthopedics Progress Note  Subjective: Comfortable this AM.  Objective:  Filed Vitals:   04/02/14 0552  BP: 113/56  Pulse: 88  Temp: 97.5 F (36.4 C)  Resp: 18    General: Awake and alert  Musculoskeletal: left shoulder in good position, propped in bed Neurovascularly intact  Lab Results  Component Value Date   WBC 5.2 04/02/2014   HGB 9.8* 04/02/2014   HCT 30.1* 04/02/2014   MCV 89.3 04/02/2014   PLT 128* 04/02/2014       Component Value Date/Time   NA 139 04/02/2014 0432   K 3.8 04/02/2014 0432   CL 108 04/02/2014 0432   CO2 21 04/02/2014 0432   GLUCOSE 101* 04/02/2014 0432   BUN 15 04/02/2014 0432   CREATININE 0.64 04/02/2014 0432   CALCIUM 8.6 04/02/2014 0432   GFRNONAA 80* 04/02/2014 0432   GFRAA >90 04/02/2014 0432    Lab Results  Component Value Date   INR 1.14 06/04/2013   INR 1.75* 05/27/2013   INR 1.93* 01/14/2013    Assessment/Plan: S/p proximal humerus fracture in excellent position for non op treatment. Sling, no ROM, no WB Agree with short term SNF for mobility, balance, ADL assistance F/u with me in two weeks in the office [phone] Discussed with patient and family at bedside who agree with and understand plan Thank you  Doran Heater. Veverly Fells, MD 04/02/2014 6:35 AM

## 2014-04-02 NOTE — Clinical Social Work Note (Signed)
Clinical Social Worker facilitated patient discharge including contacting patient family and facility to confirm patient discharge plans.  Clinical information faxed to facility and family agreeable with plan.  CSW arranged ambulance transport via [facility] to [facility].  RN to call report prior to discharge.  Clinical Social Worker will sign off for now as social work intervention is no longer needed. Please consult us again if new need arises.  Jesse Dunia Pringle, LCSW [phone] 

## 2014-04-02 NOTE — Progress Notes (Signed)
Subjective: Pain controlled with sling as long as she does not move arm.  Vicodin and Valium given last night.  Objective: Vital signs in last 24 hours: Temp:  [97.5 F (36.4 C)-97.8 F (36.6 C)] 97.5 F (36.4 C) (05/15 0552) Pulse Rate:  [77-99] 88 (05/15 0552) Resp:  [18-20] 18 (05/15 0552) BP: (103-124)/(37-70) 113/56 mmHg (05/15 0552) SpO2:  [94 %-99 %] 99 % (05/15 0552) Weight change:  Last BM Date: 03/30/14  CBG (last 3)  No results found for this basename: GLUCAP,  in the last 72 hours  Intake/Output from previous day: 05/14 0701 - 05/15 0700 In: 1620 [P.O.:720; I.V.:900] Out: 295 [Urine:295] Intake/Output this shift:    General appearance: alert, no distress and mild confusion Eyes: no scleral icterus Throat: oropharynx moist without erythema Resp: clear to auscultation bilaterally Cardio: irregularly irregular rhythm GI: soft, non-tender; bowel sounds normal; no masses,  no organomegaly Extremities: no clubbing, cyanosis or edema; left arm sling; pulses intact   Lab Results:  Recent Labs  03/31/14 1219 04/01/14 0438 04/02/14 0432  NA 143 142 139  K 3.7 3.6* 3.8  CL 105 107 108  CO2 24 22 21   GLUCOSE 93 99 101*  BUN 20 19 15   CREATININE 0.78 0.66 0.64  CALCIUM 9.3 8.8 8.6  MG 1.9  --   --     Recent Labs  04/01/14 0438  AST 24  ALT 6  ALKPHOS 47  BILITOT 0.6  PROT 5.6*  ALBUMIN 2.6*    Recent Labs  03/31/14 1219 04/01/14 0438 04/02/14 0432  WBC 5.7 5.6 5.2  NEUTROABS 4.3  --   --   HGB 11.9* 10.1* 9.8*  HCT 36.9 30.4* 30.1*  MCV 91.3 88.9 89.3  PLT 114* 122* 128*   Lab Results  Component Value Date   INR 1.14 06/04/2013   INR 1.75* 05/27/2013   INR 1.93* 01/14/2013    Recent Labs  03/31/14 1219  TROPONINI <0.30   Urine Cultures- Klebsiella pansensitive  Studies/Results: Dg Ribs Unilateral W/chest Left  03/31/2014   CLINICAL DATA:  Fall.  EXAM: LEFT RIBS AND CHEST - 3+ VIEW  COMPARISON:  06/06/2013  FINDINGS: Normal heart  size. No pleural effusion or edema. Lungs are hyperinflated but clear. Previous right shoulder arthroplasty. At comminuted fracture involving the proximal left humerus is noted. No displaced rib fractures are identified.  IMPRESSION: 1. Left humerus fracture. 2. No rib fractures identified.   Electronically Signed   By: Kerby Moors M.D.   On: 03/31/2014 13:03   Ct Head Wo Contrast  03/31/2014   CLINICAL DATA:  Fall and passed out.  EXAM: CT HEAD WITHOUT CONTRAST  TECHNIQUE: Contiguous axial images were obtained from the base of the skull through the vertex without contrast.  COMPARISON:  01/13/2013  FINDINGS: There is stable cerebral atrophy. Stable low-density in the periventricular and subcortical white matter suggesting chronic changes. No evidence for acute hemorrhage, mass lesion, midline shift, hydrocephalus or large infarct. No acute bone abnormality. Stable lucent areas within the calvarium.  IMPRESSION: No acute intracranial abnormality.  Atrophy and evidence for chronic small vessel ischemic changes.   Electronically Signed   By: Markus Daft M.D.   On: 03/31/2014 14:49   Dg Shoulder Left  03/31/2014   CLINICAL DATA:  Fall.  Left shoulder pain  EXAM: LEFT SHOULDER - 2+ VIEW  COMPARISON:  None.  FINDINGS: The bones are diffusely osteopenic. There is a comminuted fracture involving the proximal humerus at the level of  the surgical neck. Mild varus angulation of the distal fracture fragments noted.  IMPRESSION: 1. Comminuted fracture involves the proximal left humerus.   Electronically Signed   By: Kerby Moors M.D.   On: 03/31/2014 13:00     Medications: Scheduled: . atorvastatin  10 mg Oral q1800  . cefTRIAXone (ROCEPHIN)  IV  1 g Intravenous Q24H  . cholecalciferol  5,000 Units Oral Daily  . digoxin  0.125 mg Oral Once per day on Mon Wed Fri  . donepezil  10 mg Oral QHS  . feeding supplement (ENSURE COMPLETE)  237 mL Oral Q24H  . metoprolol tartrate  12.5 mg Oral q morning - 10a  .  multivitamin with minerals  1 tablet Oral Daily  . rivaroxaban  20 mg Oral Q supper  . sodium chloride  3 mL Intravenous Q12H  . vitamin C  500 mg Oral Daily   Continuous: . 0.9 % NaCl with KCl 20 mEq / L 100 mL/hr at 04/02/14 0023    Assessment/Plan: Principal Problem:  1. Fall/possible syncope- unclear whether she had a true syncopal episode or if Dementia limits her recollection of events. No events on telemetry.  Do not plan to repeat Echo (EF 55-60%, no AS in 2/14) or carotids (no stenosis in 2/14). CT shows no acute findings. Continue IV fluids for mild volume depletion. Mild drop from sitting to standing on orthostatics but no change from lying to standing.  No orthostatic symptoms. Active Problems:  2. Left Humerus fracture- Appreciate Ortho guidance.  Nonoperative management with sling and pain meds as needed. Unfortunately, she has limited function of right shoulder due to prior fracture.  3. Atrial fibrillation- continue rate control and anticoagulation with Xeralto.  4. UTI- Klebsiella- sensitivities pending. Continue Rocephin- can transition to Cipro on discharge. 5. Alzheimer's Dementia- impacts recollection of events surrounding fall. Continue Aricept. Add Remeron for poor appetite per daughter's request. 6. Disposition- Anticipate discharge to [facility] when bed available (tomorrow or Monday).   LOS: 2 days   Marton Redwood 04/02/2014, 7:12 AM

## 2014-04-04 NOTE — Clinical Social Work Psychosocial (Signed)
     Clinical Social Work Department BRIEF PSYCHOSOCIAL ASSESSMENT 04/01/2014  Patient:  Penny [name], Penny [name]     Account Number:  [id number]     Admit date:  03/31/2014  Clinical Social Worker:  Iona Coach  Date/Time:  04/01/2014 05:00 PM  Referred by:  Physician  Date Referred:  04/01/2014 Referred for  SNF Placement   Other Referral:   Interview type:  Other - See comment Other interview type:   Patient and husband    PSYCHOSOCIAL DATA Living Status:  HUSBAND Admitted from facility:   Level of care:   Primary support name:  Penny [name]  [phone] Primary support relationship to patient:  SPOUSE Degree of support available:   Strong support    Daughter: Penny [name]  [address] Current Concerns  Post-Acute Placement   Other Concerns:   SNF vs home with [place] / PLAN CSW met this afternoon with patient and her husband Penny [name] to discuss Pt's recommendation for SNF placement.  CSW was sitting up in bed and asked questions about what rehab placement met. When told that she woudl go and stay in a facility for a brief period of time- she stated "NO!  I'm going home." Patient related that she has daughters who are nurses and her husband is at home. She feels that her family is very supportive and involved in her care. Patient's husband listened to his wife and but stated that he would have to talk to their daughters and son before making a decision. He would accept patient to return home if that was her wish.  CSW provided information regarding short term SNF vs Home health care.  Husband stated that he would not be able to get in touch with his daughter Juliann Pulse until this evening; CSW stated would follow up in the morning regarding their decision. Patient (reluctantly) allowed CSW to send out her referrral to [facility] for possible bed search.  Nursing reported that patient's daughters are requesting [facility].  Will follow up in  the morning.  Fl2 placed on chart for MD's signature.   Assessment/plan status:  Psychosocial Support/Ongoing Assessment of Needs Other assessment/ plan:   Information/referral to community resources:   SNF list provided to patient and husband.    PATIENTS/FAMILYS RESPONSE TO PLAN OF CARE: Patient is alert and oriented to person and place. She does have some short term memory loss per husband. At this point- she feels strongly that she wants to go home and will allow home health nurses to check on her (has had home health in the past.)  Her husband stated that he would leave the decision up to his wife but would consult his daughters for their opinion as well. CSW services to provided continued support and assistance with d/c.

## 2014-04-04 NOTE — Clinical Social Work Placement (Signed)
     Clinical Social Work Department CLINICAL SOCIAL WORK PLACEMENT NOTE 04/04/2014  Patient:  Penny [name], Penny [name]  Account Number:  [id number] Admit date:  03/31/2014  Clinical Social Worker:  Butch Penny Jaleiah Asay, LCSWA  Date/time:  04/01/2014 05:30 PM  Clinical Social Work is seeking post-discharge placement for this patient at the following level of care:   SKILLED NURSING   (*CSW will update this form in Epic as items are completed)   04/01/2014  Patient/family provided with [place] Department of Clinical Social Works list of facilities offering this level of care within the geographic area requested by the patient (or if unable, by the patients family).  04/01/2014  Patient/family informed of their freedom to choose among providers that offer the needed level of care, that participate in Medicare, Medicaid or managed care program needed by the patient, have an available bed and are willing to accept the patient.  04/01/2014  Patient/family informed of [facility] ownership interest in [facility], as well as of the fact that they are under no obligation to receive care at this facility.  PASARR submitted to EDS on 04/01/2014 PASARR number received from EDS on 04/01/2014  FL2 transmitted to all facilities in geographic area requested by pt/family on  04/01/2014 FL2 transmitted to all facilities within larger geographic area on   Patient informed that his/her managed care company has contracts with or will negotiate with  certain facilities, including the following:   [facility]- Medicare Complete     Patient/family informed of bed offers received:  04/02/2014 Patient chooses bed at [place] Physician recommends and patient chooses bed at    Patient to be transferred to [place] on  04/02/2014 Patient to be transferred to facility by Ambulance [facility])  The following physician request were entered in Epic:   Additional Comments: Patient d/c'd to SNF on 04/02/14 - dc  facilitated by Barbette Or, LCSW.

## 2014-04-05 ENCOUNTER — Encounter: Payer: Self-pay | Admitting: *Deleted

## 2014-04-05 ENCOUNTER — Encounter: Payer: Self-pay | Admitting: Adult Health

## 2014-04-05 ENCOUNTER — Non-Acute Institutional Stay (SKILLED_NURSING_FACILITY): Payer: Medicare Other | Admitting: Adult Health

## 2014-04-05 DIAGNOSIS — F3289 Other specified depressive episodes: Secondary | ICD-10-CM

## 2014-04-05 DIAGNOSIS — F0391 Unspecified dementia with behavioral disturbance: Secondary | ICD-10-CM | POA: Insufficient documentation

## 2014-04-05 DIAGNOSIS — F329 Major depressive disorder, single episode, unspecified: Secondary | ICD-10-CM

## 2014-04-05 DIAGNOSIS — R55 Syncope and collapse: Secondary | ICD-10-CM

## 2014-04-05 DIAGNOSIS — I10 Essential (primary) hypertension: Secondary | ICD-10-CM

## 2014-04-05 DIAGNOSIS — F0390 Unspecified dementia without behavioral disturbance: Secondary | ICD-10-CM

## 2014-04-05 DIAGNOSIS — I4891 Unspecified atrial fibrillation: Secondary | ICD-10-CM

## 2014-04-05 DIAGNOSIS — S42309A Unspecified fracture of shaft of humerus, unspecified arm, initial encounter for closed fracture: Secondary | ICD-10-CM

## 2014-04-05 DIAGNOSIS — F32A Depression, unspecified: Secondary | ICD-10-CM | POA: Insufficient documentation

## 2014-04-05 DIAGNOSIS — D62 Acute posthemorrhagic anemia: Secondary | ICD-10-CM

## 2014-04-05 DIAGNOSIS — F03918 Unspecified dementia, unspecified severity, with other behavioral disturbance: Secondary | ICD-10-CM | POA: Insufficient documentation

## 2014-04-05 DIAGNOSIS — N390 Urinary tract infection, site not specified: Secondary | ICD-10-CM

## 2014-04-05 NOTE — Progress Notes (Addendum)
Patient ID: Penny [name], female   DOB: [birth year], 80 y.o.   MRN: [id number]               PROGRESS NOTE  DATE: 04/05/2014  FACILITY: Nursing Home Location: [facility]  LEVEL OF CARE: SNF (31)  Acute Visit  CHIEF COMPLAINT:  Follow-up hospitalization  HISTORY OF PRESENT ILLNESS: This is an 80 year old female who has been admitted to [facility] on 04/02/14 from [facility]. She had a syncope and collapse causing her to fall and sustaining a left humerus fracture. Orthopedics recommend nonsurgical  REASSESSMENT OF ONGOING PROBLEM(S):  HTN: Pt 's HTN remains stable.  Denies CP, sob, DOE, pedal edema, headaches, dizziness or visual disturbances.  No complications from the medications currently being used.  Last BP : 128/76  UTI: The UTI remains stable.  The patient denies ongoing suprapubic pain, flank pain, dysuria, urinary frequency, urinary hesitancy or hematuria.  No complications reported from the current antibiotic being used.  DEMENTIA: The dementia remaines stable and continues to function adequately in the current living environment with supervision.  The patient has had little changes in behavior. No complications noted from the medications presently being used.  PAST MEDICAL HISTORY : Reviewed.  No changes/see problem list  CURRENT MEDICATIONS: Reviewed per MAR/see medication list  REVIEW OF SYSTEMS:  GENERAL: no change in appetite, no fatigue, no weight changes, no fever, chills or weakness RESPIRATORY: no cough, SOB, DOE, wheezing, hemoptysis CARDIAC: no chest pain, edema or palpitations GI: no abdominal pain, diarrhea, constipation, heart burn, nausea or vomiting  PHYSICAL EXAMINATION  GENERAL: no acute distress, normal body habitus EYES: conjunctivae normal, sclerae normal, normal eye lids NECK: supple, trachea midline, no neck masses, no thyroid tenderness, no thyromegaly LYMPHATICS: no LAN in the neck, no supraclavicular  LAN RESPIRATORY: breathing is even & unlabored, BS CTAB CARDIAC: irregularly irregular, no murmur,no extra heart sounds, no edema GI: abdomen soft, normal BS, no masses, no tenderness, no hepatomegaly, no splenomegaly EXTREMITIES: able to move all 4 extremities PSYCHIATRIC: the patient is alert & oriented to person, affect & behavior appropriate  LABS/RADIOLOGY: Labs reviewed: Basic Metabolic Panel:  Recent Labs  06/04/13 0810 03/31/14 1219 04/01/14 0438 04/02/14 0432  NA 140 143 142 139  K 3.4* 3.7 3.6* 3.8  CL 107 105 107 108  CO2 24 24 22 21   GLUCOSE 79 93 99 101*  BUN 18 20 19 15   CREATININE 0.72 0.78 0.66 0.64  CALCIUM 9.3 9.3 8.8 8.6  MG  --  1.9  --   --    Liver Function Tests:  Recent Labs  04/01/14 0438  AST 24  ALT 6  ALKPHOS 47  BILITOT 0.6  PROT 5.6*  ALBUMIN 2.6*    CBC:  Recent Labs  03/31/14 1219 04/01/14 0438 04/02/14 0432  WBC 5.7 5.6 5.2  NEUTROABS 4.3  --   --   HGB 11.9* 10.1* 9.8*  HCT 36.9 30.4* 30.1*  MCV 91.3 88.9 89.3  PLT 114* 122* 128*   Cardiac Enzymes:  Recent Labs  03/31/14 1219  TROPONINI <0.30     ASSESSMENT/PLAN:  Possible Syncope and Collapse - stable LEFT humerus fracture - nonoperative management; continue sling on left shoulder Atrial Fibrillation - rate-controlled; continue Digoxin and Lopressor UTI - continue Cipro Dementia - stable; continue Aricept Anemia, acute blood loss - stable; check CBC Hypertension - well-controlled; continue Lopressor Depression - continue Remeron Hyperlipidemia - continue Lipitor     CPT CODE:  57846  Seth Bake - NP [facility] [phone]

## 2014-04-06 ENCOUNTER — Non-Acute Institutional Stay (SKILLED_NURSING_FACILITY): Payer: Medicare Other | Admitting: Internal Medicine

## 2014-04-06 DIAGNOSIS — I4891 Unspecified atrial fibrillation: Secondary | ICD-10-CM

## 2014-04-06 DIAGNOSIS — N390 Urinary tract infection, site not specified: Secondary | ICD-10-CM

## 2014-04-06 DIAGNOSIS — F0390 Unspecified dementia without behavioral disturbance: Secondary | ICD-10-CM

## 2014-04-06 DIAGNOSIS — S42309A Unspecified fracture of shaft of humerus, unspecified arm, initial encounter for closed fracture: Secondary | ICD-10-CM

## 2014-04-06 NOTE — Progress Notes (Signed)
HISTORY & PHYSICAL  DATE: 04/06/2014   FACILITY: [facility]  LEVEL OF CARE: SNF (31)  ALLERGIES:  Allergies  Allergen Reactions  . Zocor [Simvastatin]   . Sulfa Antibiotics Rash    CHIEF COMPLAINT:  Manage left humerus fracture, UTI  and fibrillation  HISTORY OF PRESENT ILLNESS: The patient is an 80 year old Caucasian female.  HUMERUS FRACTURE: Patient fell and sustained a humeral fracture. Orthopedics recommended nonoperative management. Currently patient wears a sling. Patient is admitted to this facility for short-term rehabilitation. Patient denies current pain. No complications reported from the pain medications presently being used.  ATRIAL FIBRILLATION: the patients atrial fibrillation remains stable.  The patient denies DOE, tachycardia, orthopnea, transient neurological sx, pedal edema, palpitations, & PNDs.  No complications noted from the medications currently being used.  UTI: The UTI remains stable.  The patient denies ongoing suprapubic pain, flank pain, dysuria, urinary frequency, urinary hesitancy or hematuria.  No complications reported from the current antibiotic being used.  PAST MEDICAL HISTORY :  Past Medical History  Diagnosis Date  . Long-term (current) use of anticoagulants   . Chronic shoulder pain   . Heart palpitations     rare  . Memory problem     "a little short term and long term memory loss" (03/31/2014)  . Depression   . Hyperlipidemia   . Dyslipidemia   . Seasonal allergies   . Anxiety   . Hypertension   . CHF (congestive heart failure)   . Atrial fibrillation   . Humerus fracture 03/31/2014    "fell and broke left arm"  . Kidney stones   . Anginal pain   . Blood in urine     "chronic" (03/31/2014)  . Arthritis     "joints" (03/31/2014)  . Osteopenia     "severe" (03/31/2014)  . Syncope and collapse 03/31/2014  . Alzheimer's dementia 03/31/2014    PAST SURGICAL HISTORY: Past Surgical History    Procedure Laterality Date  . Ureteral stent placement Bilateral     for stenosis; "they've been removed"  . Removal of growth  2007    growth under kidney removed and possibly kindey stones removed  . Knee arthroscopy Right 2011  . Total shoulder arthroplasty Right 10/2007  . Nephrolithotomy Right 06/04/2013    Procedure: NEPHROLITHOTOMY PERCUTANEOUS;  Surgeon: Franchot Gallo, MD;  Location: [facility];  Service: Urology;  Laterality: Right;  . Appendectomy  ~ 1953  . Bladder suspension      w/hysterectomy  . Cardioversion  2006  . Meniscus repair Left 07/2000  . Abdominal hysterectomy      partial    SOCIAL HISTORY:  reports that she quit smoking about 32 years ago. Her smoking use included Cigarettes. She has a 1.25 pack-year smoking history. She has never used smokeless tobacco. She reports that she does not drink alcohol or use illicit drugs.  FAMILY HISTORY:  Family History  Problem Relation Age of Onset  . Heart disease Mother     CURRENT MEDICATIONS: Reviewed per MAR/see medication list  REVIEW OF SYSTEMS:  See HPI otherwise 14 point ROS is negative.  PHYSICAL EXAMINATION  VS:  See VS section  GENERAL: no acute distress, normal body habitus EYES: conjunctivae normal, sclerae normal, normal eye lids MOUTH/THROAT: lips without lesions,no lesions in the mouth,tongue is without lesions,uvula elevates in midline NECK: supple, trachea midline, no neck masses, no thyroid tenderness, no thyromegaly LYMPHATICS: no LAN in the neck, no supraclavicular  LAN RESPIRATORY: breathing is even & unlabored, BS CTAB CARDIAC: Heart rate is irregularly irregular, no murmur,no extra heart sounds, no edema GI:  ABDOMEN: abdomen soft, normal BS, no masses, no tenderness  LIVER/SPLEEN: no hepatomegaly, no splenomegaly MUSCULOSKELETAL: HEAD: normal to inspection & palpation BACK: no kyphosis, scoliosis or spinal processes tenderness EXTREMITIES: LEFT UPPER EXTREMITY: In a sling RIGHT  UPPER EXTREMITY:  Moderate range of motion, normal strength & tone LEFT LOWER EXTREMITY:  full range of motion, normal strength & tone RIGHT LOWER EXTREMITY:  full range of motion, normal strength & tone PSYCHIATRIC: the patient is alert & oriented to person, affect & behavior appropriate  LABS/RADIOLOGY:  Labs reviewed: Basic Metabolic Panel:  Recent Labs  06/04/13 0810 03/31/14 1219 04/01/14 0438 04/02/14 0432  NA 140 143 142 139  K 3.4* 3.7 3.6* 3.8  CL 107 105 107 108  CO2 24 24 22 21   GLUCOSE 79 93 99 101*  BUN 18 20 19 15   CREATININE 0.72 0.78 0.66 0.64  CALCIUM 9.3 9.3 8.8 8.6  MG  --  1.9  --   --    Liver Function Tests:  Recent Labs  04/01/14 0438  AST 24  ALT 6  ALKPHOS 47  BILITOT 0.6  PROT 5.6*  ALBUMIN 2.6*   CBC:  Recent Labs  03/31/14 1219 04/01/14 0438 04/02/14 0432  WBC 5.7 5.6 5.2  NEUTROABS 4.3  --   --   HGB 11.9* 10.1* 9.8*  HCT 36.9 30.4* 30.1*  MCV 91.3 88.9 89.3  PLT 114* 122* 128*   Cardiac Enzymes:  Recent Labs  03/31/14 1219  TROPONINI <0.30    LEFT SHOULDER - 2+ VIEW   COMPARISON:  None.   FINDINGS: The bones are diffusely osteopenic. There is a comminuted fracture involving the proximal humerus at the level of the surgical neck. Mild varus angulation of the distal fracture fragments noted.   IMPRESSION: 1. Comminuted fracture involves the proximal left humerus.     LEFT RIBS AND CHEST - 3+ VIEW   COMPARISON:  06/06/2013   FINDINGS: Normal heart size. No pleural effusion or edema. Lungs are hyperinflated but clear. Previous right shoulder arthroplasty. At comminuted fracture involving the proximal left humerus is noted. No displaced rib fractures are identified.   IMPRESSION: 1. Left humerus fracture. 2. No rib fractures identified.   CT HEAD WITHOUT CONTRAST   TECHNIQUE: Contiguous axial images were obtained from the base of the skull through the vertex without contrast.   COMPARISON:   01/13/2013   FINDINGS: There is stable cerebral atrophy. Stable low-density in the periventricular and subcortical white matter suggesting chronic changes. No evidence for acute hemorrhage, mass lesion, midline shift, hydrocephalus or large infarct. No acute bone abnormality. Stable lucent areas within the calvarium.   IMPRESSION: No acute intracranial abnormality.   Atrophy and evidence for chronic small vessel ischemic changes.   ASSESSMENT/PLAN:  Left humerus fracture-continue sling and rehabilitation Atrial fibrillation-rate controlled UTI-continue Cipro Alzheimer's dementia-stable Constipation-well-controlled Anemia -- check hemoglobin level Check CBC and BMP  I have reviewed patient's medical records received at admission/from hospitalization.  CPT CODE: 84696  Angline Schweigert Y Juddson [place], [place] [phone]

## 2014-04-16 ENCOUNTER — Encounter: Payer: Self-pay | Admitting: Adult Health

## 2014-04-16 ENCOUNTER — Non-Acute Institutional Stay (SKILLED_NURSING_FACILITY): Payer: Medicare Other | Admitting: Adult Health

## 2014-04-16 DIAGNOSIS — F32A Depression, unspecified: Secondary | ICD-10-CM

## 2014-04-16 DIAGNOSIS — F0390 Unspecified dementia without behavioral disturbance: Secondary | ICD-10-CM

## 2014-04-16 DIAGNOSIS — I10 Essential (primary) hypertension: Secondary | ICD-10-CM

## 2014-04-16 DIAGNOSIS — F329 Major depressive disorder, single episode, unspecified: Secondary | ICD-10-CM

## 2014-04-16 DIAGNOSIS — F3289 Other specified depressive episodes: Secondary | ICD-10-CM

## 2014-04-16 DIAGNOSIS — D62 Acute posthemorrhagic anemia: Secondary | ICD-10-CM

## 2014-04-16 DIAGNOSIS — I4891 Unspecified atrial fibrillation: Secondary | ICD-10-CM

## 2014-04-16 DIAGNOSIS — S42309A Unspecified fracture of shaft of humerus, unspecified arm, initial encounter for closed fracture: Secondary | ICD-10-CM

## 2014-04-16 NOTE — Progress Notes (Signed)
Patient ID: Penny [name], female   DOB: June 07, 1929, 80 y.o.   MRN: [id number]                 PROGRESS NOTE  DATE: 04/16/14  FACILITY: Nursing Home Location: [facility]  LEVEL OF CARE: SNF (31)  Acute Visit  CHIEF COMPLAINT:  Discharge Notes  HISTORY OF PRESENT ILLNESS: This is an 80 year old female who is for discharge home with Home health PT, OT and Nursing. She has been admitted to [facility] on 04/02/14 from [facility]. She had a syncope and collapse causing her to fall and sustaining a left humerus fracture. Orthopedics recommend non surgical.  Patient was admitted to this facility for short-term rehabilitation after the patient's recent hospitalization.  Patient has completed SNF rehabilitation and therapy has cleared the patient for discharge.  REASSESSMENT OF ONGOING PROBLEM(S):  HTN: Pt 's HTN remains stable.  Denies CP, sob, DOE, pedal edema, headaches, dizziness or visual disturbances.  No complications from the medications currently being used.  Last BP : 134/68  DEPRESSION: The depression remains stable. Patient denies ongoing feelings of sadness, insomnia, anedhonia or lack of appetite. No complications reported from the medications currently being used. Staff do not report behavioral problems.  ATRIAL FIBRILLATION: the patients atrial fibrillation remains stable.  The patient denies DOE, tachycardia, orthopnea, transient neurological sx, pedal edema, palpitations, & PNDs.  No complications noted from the medications currently being used.   PAST MEDICAL HISTORY : Reviewed.  No changes/see problem list  CURRENT MEDICATIONS: Reviewed per MAR/see medication list  REVIEW OF SYSTEMS:  GENERAL: no change in appetite, no fatigue, no weight changes, no fever, chills or weakness RESPIRATORY: no cough, SOB, DOE, wheezing, hemoptysis CARDIAC: no chest pain, edema or palpitations GI: no abdominal pain, diarrhea, constipation, heart burn, nausea or  vomiting  PHYSICAL EXAMINATION  GENERAL: no acute distress, normal body habitus NECK: supple, trachea midline, no neck masses, no thyroid tenderness, no thyromegaly LYMPHATICS: no LAN in the neck, no supraclavicular LAN RESPIRATORY: breathing is even & unlabored, BS CTAB CARDIAC: irregularly irregular, no murmur,no extra heart sounds, no edema GI: abdomen soft, normal BS, no masses, no tenderness, no hepatomegaly, no splenomegaly EXTREMITIES: able to move all 4 extremities PSYCHIATRIC: the patient is alert & oriented to person, affect & behavior appropriate  LABS/RADIOLOGY: 04/07/14  WBC 5.6 hemoglobin 10.9 hematocrit 34.3 sodium 141 potassium 3.7 glucose 92 BUN 14 creatinine 0.8 calcium 8.9 Labs reviewed: Basic Metabolic Panel:  Recent Labs  06/04/13 0810 03/31/14 1219 04/01/14 0438 04/02/14 0432  NA 140 143 142 139  K 3.4* 3.7 3.6* 3.8  CL 107 105 107 108  CO2 24 24 22 21   GLUCOSE 79 93 99 101*  BUN 18 20 19 15   CREATININE 0.72 0.78 0.66 0.64  CALCIUM 9.3 9.3 8.8 8.6  MG  --  1.9  --   --    Liver Function Tests:  Recent Labs  04/01/14 0438  AST 24  ALT 6  ALKPHOS 47  BILITOT 0.6  PROT 5.6*  ALBUMIN 2.6*    CBC:  Recent Labs  03/31/14 1219 04/01/14 0438 04/02/14 0432  WBC 5.7 5.6 5.2  NEUTROABS 4.3  --   --   HGB 11.9* 10.1* 9.8*  HCT 36.9 30.4* 30.1*  MCV 91.3 88.9 89.3  PLT 114* 122* 128*   Cardiac Enzymes:  Recent Labs  03/31/14 1219  TROPONINI <0.30     ASSESSMENT/PLAN:  LEFT humerus fracture - nonoperative  management; continue sling on left shoulder Atrial Fibrillation - rate-controlled; continue Digoxin and Lopressor Dementia - stable; continue Aricept Anemia, acute blood loss - stable Hypertension - well-controlled; continue Lopressor Depression - continue Remeron Hyperlipidemia - continue Lipitor   I have filled out patient's discharge paperwork and written prescriptions.  Patient will receive home health PT, OT and  Nursing.  Total discharge time: Less than 30 minutes Discharge time involved coordination of the discharge process with Education officer, museum, nursing staff and therapy department. Medical justification for home health services verified.   CPT CODE: 54656  Seth Bake - NP [facility] [phone]

## 2014-05-19 ENCOUNTER — Ambulatory Visit (INDEPENDENT_AMBULATORY_CARE_PROVIDER_SITE_OTHER): Payer: Medicare Other | Admitting: Cardiology

## 2014-05-19 ENCOUNTER — Encounter: Payer: Self-pay | Admitting: Cardiology

## 2014-05-19 ENCOUNTER — Encounter (INDEPENDENT_AMBULATORY_CARE_PROVIDER_SITE_OTHER): Payer: Self-pay

## 2014-05-19 VITALS — BP 125/70 | HR 79 | Ht 64.0 in | Wt 121.0 lb

## 2014-05-19 DIAGNOSIS — I10 Essential (primary) hypertension: Secondary | ICD-10-CM

## 2014-05-19 DIAGNOSIS — S42302S Unspecified fracture of shaft of humerus, left arm, sequela: Secondary | ICD-10-CM

## 2014-05-19 DIAGNOSIS — I482 Chronic atrial fibrillation, unspecified: Secondary | ICD-10-CM

## 2014-05-19 DIAGNOSIS — S42309S Unspecified fracture of shaft of humerus, unspecified arm, sequela: Secondary | ICD-10-CM

## 2014-05-19 DIAGNOSIS — S42302A Unspecified fracture of shaft of humerus, left arm, initial encounter for closed fracture: Secondary | ICD-10-CM | POA: Insufficient documentation

## 2014-05-19 DIAGNOSIS — R55 Syncope and collapse: Secondary | ICD-10-CM

## 2014-05-19 DIAGNOSIS — I4891 Unspecified atrial fibrillation: Secondary | ICD-10-CM

## 2014-05-19 DIAGNOSIS — F0390 Unspecified dementia without behavioral disturbance: Secondary | ICD-10-CM

## 2014-05-19 NOTE — Assessment & Plan Note (Signed)
The patient is on rate control and anticoagulation for permanent atrial fibrillation.  She is not having any TIA or stroke symptoms.

## 2014-05-19 NOTE — Progress Notes (Signed)
Penny [name] Date of Birth:  August 02, 1929 [place] [address] [place], [place]  [address] [phone]        Fax   [phone]   History of Present Illness:  This pleasant 80 year old woman is seen for a scheduled followup office visit. She has a history of chronic atrial fibrillation. She also has a past history of syncope and collapse which occurred at about the beginning of July 2013. She was found to have low blood pressure following that episode and her lisinopril was stopped. Subsequently the patient did well until 01/12/13 when she had another episode of syncope at a restaurant and was hospitalized briefly. She subsequently wore a 30 day event monitor. The event monitor did not show any cause for her syncope. She was in constant atrial fibrillation with a controlled ventricular response and no excessive bradycardia or tachycardia was seen. The patient reports that she has been feeling better. Her daughter who is a Marine scientist also states that she thinks that her mother has been doing better since we cut her back on her digoxin to just a small dose of 0.125 Monday Wednesday and Friday only.  The patient was hospitalized with a kidney stone which was removed on 06/04/13. She spent 3 nights in the hospital postop. Her anticoagulation was held appropriately prior to the surgery. She is no longer on Coumadin. Her PCP switched her to Xarelto and I agree with that decision. She has recently been diagnosed with osteoporosis and is being treated for this by Dr. Brigitte Pulse. Since her last she saw her she fell and fractured her left proximal humerus.  Dr. Veverly Fells is her orthopedist.  She was treated without surgery.  She wore a sling for a while and is improving slowly.  Current Outpatient Prescriptions  Medication Sig Dispense Refill  . Cholecalciferol (VITAMIN D-3) 5000 UNITS TABS Take 5,000 Units by mouth daily.      . CRESTOR 5 MG tablet take 1 tablet by mouth at bedtime  90 tablet   0  . diazepam (VALIUM) 5 MG tablet Take 2.5-5 mg by mouth every 12 (twelve) hours as needed for anxiety.      . digoxin (LANOXIN) 0.125 MG tablet Take 0.125 mg by mouth 3 (three) times a week. 1 pill on Monday, Wednesday, and Friday      . donepezil (ARICEPT) 10 MG tablet Take 10 mg by mouth at bedtime.       . feeding supplement, ENSURE COMPLETE, (ENSURE COMPLETE) LIQD Take 237 mLs by mouth daily.  30 Bottle  3  . HYDROcodone-acetaminophen (NORCO/VICODIN) 5-325 MG per tablet Take 1-2 tablets by mouth every 4 (four) hours as needed for moderate pain.  30 tablet  0  . meclizine (ANTIVERT) 25 MG tablet Take 25 mg by mouth 3 (three) times daily as needed for dizziness.       . metoprolol tartrate (LOPRESSOR) 25 MG tablet Take 12.5 mg by mouth every morning.      . mirtazapine (REMERON) 15 MG tablet Take 1 tablet (15 mg total) by mouth at bedtime.  30 tablet  6  . Multiple Vitamin (MULTIVITAMIN WITH MINERALS) TABS Take 1 tablet by mouth daily.      . Omega-3 Fatty Acids (FISH OIL PO) Take 1 capsule by mouth daily.       . ondansetron (ZOFRAN) 4 MG tablet Take 1 tablet (4 mg total) by mouth every 8 (eight) hours as needed for nausea or vomiting.  12  tablet  0  . polyethylene glycol (MIRALAX / GLYCOLAX) packet Take 17 g by mouth daily as needed for mild constipation or moderate constipation.  14 each  0  . Rivaroxaban (XARELTO) 20 MG TABS tablet Take 1 tablet (20 mg total) by mouth daily.  30 tablet     No current facility-administered medications for this visit.    Allergies  Allergen Reactions  . Zocor [Simvastatin]   . Sulfa Antibiotics Rash    Patient Active Problem List   Diagnosis Date Noted  . Atrial fibrillation 02/26/2011    Priority: High  . Malaise and fatigue 03/23/2011    Priority: Medium  . Left humeral fracture 05/19/2014  . Dementia 04/05/2014  . Acute blood loss anemia 04/05/2014  . Hypertension 04/05/2014  . Depression 04/05/2014  . Humerus fracture 03/31/2014     Class: Acute  . Syncope 03/31/2014  . Bradycardia 01/14/2013  . UTI (urinary tract infection) 01/13/2013    Class: Acute  . Syncope and collapse 05/28/2012  . Weight loss, unintentional 03/04/2012  . Inguinal hernia 09/25/2011  . Dizziness - light-headed 06/27/2011  . Right bundle branch block 03/23/2011  . Hypercholesterolemia 03/23/2011  . Long term current use of anticoagulant 02/26/2011    History  Smoking status  . Former Smoker -- 0.25 packs/day for 5 years  . Types: Cigarettes  . Quit date: 07/20/1981  Smokeless tobacco  . Never Used    History  Alcohol Use No    Family History  Problem Relation Age of Onset  . Heart disease Mother     Review of Systems: Constitutional: no fever chills diaphoresis or fatigue or change in weight.  Head and neck: no hearing loss, no epistaxis, no photophobia or visual disturbance. Respiratory: No cough, shortness of breath or wheezing. Cardiovascular: No chest pain peripheral edema, palpitations. Gastrointestinal: No abdominal distention, no abdominal pain, no change in bowel habits hematochezia or melena. Genitourinary: No dysuria, no frequency, no urgency, no nocturia. Musculoskeletal:No arthralgias, no back pain, no gait disturbance or myalgias. Neurological: No dizziness, no headaches, no numbness, no seizures, no syncope, no weakness, no tremors. Hematologic: No lymphadenopathy, no easy bruising. Psychiatric: No confusion, no hallucinations, no sleep disturbance.    Physical Exam: Filed Vitals:   05/19/14 1340  BP: 125/70  Pulse: 79   the general appearance reveals a well-developed elderly woman in no acute distress.The head and neck exam reveals pupils equal and reactive.  Extraocular movements are full.  There is no scleral icterus.  The mouth and pharynx are normal.  The neck is supple.  The carotids reveal no bruits.  The jugular venous pressure is normal.  The  thyroid is not enlarged.  There is no lymphadenopathy.   The chest is clear to percussion and auscultation.  There are no rales or rhonchi.  Expansion of the chest is symmetrical.  The precordium is quiet.  The pulse is irregularly irregular The first heart sound is normal.  The second heart sound is physiologically split.  There is a grade 1/6 apical systolic murmur.  There is no abnormal lift or heave.  The abdomen is soft and nontender.  The bowel sounds are normal.  The liver and spleen are not enlarged.  There are no abdominal masses.  There are no abdominal bruits.  Extremities reveal good pedal pulses.  There is no phlebitis or edema.  There is no cyanosis or clubbing.  Strength is normal and symmetrical in all extremities.  There is no lateralizing weakness.  There are no sensory deficits.  The skin is warm and dry.  There is no rash.  EKG today shows atrial fibrillation with controlled ventricular response of 79.  Since 04/01/14, no significant change.  The right bundle branch block is old.  Assessment / Plan: 1. permanent atrial fibrillation on rate control and Xarelto 2. Dementia 3. dyslipidemia followed by PCP  Plan: Continue same medication.  Recheck in 6 months for office visit

## 2014-05-19 NOTE — Assessment & Plan Note (Signed)
Her blood pressure was remaining stable on current therapy.  She's not having any recent hypotensive episodes.

## 2014-05-19 NOTE — Assessment & Plan Note (Signed)
Her dementia appears to be reasonably stable since last visit.  She is very pleasant with social graces but she does ask the same question multiple times .

## 2014-05-19 NOTE — Patient Instructions (Signed)
Your physician recommends that you continue on your current medications as directed. Please refer to the Current Medication list given to you today.  Your physician wants you to follow-up in: 6 month ov You will receive a reminder letter in the mail two months in advance. If you don't receive a letter, please call our office to schedule the follow-up appointment.  

## 2014-06-03 ENCOUNTER — Ambulatory Visit: Payer: Medicare Other | Attending: Orthopedic Surgery | Admitting: Physical Therapy

## 2014-06-03 DIAGNOSIS — M25619 Stiffness of unspecified shoulder, not elsewhere classified: Secondary | ICD-10-CM | POA: Diagnosis not present

## 2014-06-03 DIAGNOSIS — M25519 Pain in unspecified shoulder: Secondary | ICD-10-CM | POA: Diagnosis not present

## 2014-06-08 ENCOUNTER — Ambulatory Visit: Payer: Medicare Other | Admitting: Physical Therapy

## 2014-06-08 DIAGNOSIS — M25519 Pain in unspecified shoulder: Secondary | ICD-10-CM | POA: Diagnosis not present

## 2014-06-11 ENCOUNTER — Ambulatory Visit: Payer: Medicare Other | Admitting: Physical Therapy

## 2014-07-11 ENCOUNTER — Inpatient Hospital Stay (HOSPITAL_COMMUNITY)
Admission: EM | Admit: 2014-07-11 | Discharge: 2014-07-21 | DRG: 853 | Disposition: A | Discharge status: Home Health | Payer: Medicare Other | Attending: Internal Medicine | Admitting: Internal Medicine

## 2014-07-11 ENCOUNTER — Emergency Department (HOSPITAL_COMMUNITY): Payer: Medicare Other

## 2014-07-11 ENCOUNTER — Encounter (HOSPITAL_COMMUNITY): Payer: Self-pay | Admitting: Emergency Medicine

## 2014-07-11 DIAGNOSIS — B9689 Other specified bacterial agents as the cause of diseases classified elsewhere: Secondary | ICD-10-CM | POA: Diagnosis present

## 2014-07-11 DIAGNOSIS — Z8249 Family history of ischemic heart disease and other diseases of the circulatory system: Secondary | ICD-10-CM

## 2014-07-11 DIAGNOSIS — E872 Acidosis, unspecified: Secondary | ICD-10-CM | POA: Diagnosis present

## 2014-07-11 DIAGNOSIS — D62 Acute posthemorrhagic anemia: Secondary | ICD-10-CM

## 2014-07-11 DIAGNOSIS — I4510 Unspecified right bundle-branch block: Secondary | ICD-10-CM

## 2014-07-11 DIAGNOSIS — R4182 Altered mental status, unspecified: Secondary | ICD-10-CM

## 2014-07-11 DIAGNOSIS — A4159 Other Gram-negative sepsis: Principal | ICD-10-CM | POA: Diagnosis present

## 2014-07-11 DIAGNOSIS — F0390 Unspecified dementia without behavioral disturbance: Secondary | ICD-10-CM

## 2014-07-11 DIAGNOSIS — J9600 Acute respiratory failure, unspecified whether with hypoxia or hypercapnia: Secondary | ICD-10-CM | POA: Diagnosis present

## 2014-07-11 DIAGNOSIS — R7881 Bacteremia: Secondary | ICD-10-CM | POA: Diagnosis present

## 2014-07-11 DIAGNOSIS — Z882 Allergy status to sulfonamides status: Secondary | ICD-10-CM

## 2014-07-11 DIAGNOSIS — E7800 Pure hypercholesterolemia, unspecified: Secondary | ICD-10-CM

## 2014-07-11 DIAGNOSIS — N390 Urinary tract infection, site not specified: Secondary | ICD-10-CM

## 2014-07-11 DIAGNOSIS — G309 Alzheimer's disease, unspecified: Secondary | ICD-10-CM | POA: Diagnosis present

## 2014-07-11 DIAGNOSIS — D65 Disseminated intravascular coagulation [defibrination syndrome]: Secondary | ICD-10-CM | POA: Diagnosis present

## 2014-07-11 DIAGNOSIS — R634 Abnormal weight loss: Secondary | ICD-10-CM

## 2014-07-11 DIAGNOSIS — T380X5A Adverse effect of glucocorticoids and synthetic analogues, initial encounter: Secondary | ICD-10-CM | POA: Diagnosis present

## 2014-07-11 DIAGNOSIS — E876 Hypokalemia: Secondary | ICD-10-CM | POA: Diagnosis present

## 2014-07-11 DIAGNOSIS — A419 Sepsis, unspecified organism: Secondary | ICD-10-CM | POA: Diagnosis present

## 2014-07-11 DIAGNOSIS — R509 Fever, unspecified: Secondary | ICD-10-CM

## 2014-07-11 DIAGNOSIS — N170 Acute kidney failure with tubular necrosis: Secondary | ICD-10-CM | POA: Diagnosis present

## 2014-07-11 DIAGNOSIS — F0280 Dementia in other diseases classified elsewhere without behavioral disturbance: Secondary | ICD-10-CM | POA: Diagnosis present

## 2014-07-11 DIAGNOSIS — N1330 Unspecified hydronephrosis: Secondary | ICD-10-CM

## 2014-07-11 DIAGNOSIS — F411 Generalized anxiety disorder: Secondary | ICD-10-CM | POA: Diagnosis present

## 2014-07-11 DIAGNOSIS — I482 Chronic atrial fibrillation, unspecified: Secondary | ICD-10-CM

## 2014-07-11 DIAGNOSIS — Z96619 Presence of unspecified artificial shoulder joint: Secondary | ICD-10-CM

## 2014-07-11 DIAGNOSIS — N289 Disorder of kidney and ureter, unspecified: Secondary | ICD-10-CM

## 2014-07-11 DIAGNOSIS — N12 Tubulo-interstitial nephritis, not specified as acute or chronic: Secondary | ICD-10-CM | POA: Diagnosis present

## 2014-07-11 DIAGNOSIS — E785 Hyperlipidemia, unspecified: Secondary | ICD-10-CM | POA: Diagnosis present

## 2014-07-11 DIAGNOSIS — Z888 Allergy status to other drugs, medicaments and biological substances status: Secondary | ICD-10-CM

## 2014-07-11 DIAGNOSIS — R6521 Severe sepsis with septic shock: Secondary | ICD-10-CM

## 2014-07-11 DIAGNOSIS — R6520 Severe sepsis without septic shock: Secondary | ICD-10-CM | POA: Diagnosis present

## 2014-07-11 DIAGNOSIS — F3289 Other specified depressive episodes: Secondary | ICD-10-CM | POA: Diagnosis present

## 2014-07-11 DIAGNOSIS — Z79899 Other long term (current) drug therapy: Secondary | ICD-10-CM

## 2014-07-11 DIAGNOSIS — F32A Depression, unspecified: Secondary | ICD-10-CM

## 2014-07-11 DIAGNOSIS — I4891 Unspecified atrial fibrillation: Secondary | ICD-10-CM | POA: Diagnosis present

## 2014-07-11 DIAGNOSIS — Z7901 Long term (current) use of anticoagulants: Secondary | ICD-10-CM

## 2014-07-11 DIAGNOSIS — R7309 Other abnormal glucose: Secondary | ICD-10-CM | POA: Diagnosis present

## 2014-07-11 DIAGNOSIS — R55 Syncope and collapse: Secondary | ICD-10-CM

## 2014-07-11 DIAGNOSIS — Z87891 Personal history of nicotine dependence: Secondary | ICD-10-CM

## 2014-07-11 DIAGNOSIS — G9341 Metabolic encephalopathy: Secondary | ICD-10-CM | POA: Diagnosis present

## 2014-07-11 DIAGNOSIS — N201 Calculus of ureter: Secondary | ICD-10-CM | POA: Diagnosis present

## 2014-07-11 DIAGNOSIS — F329 Major depressive disorder, single episode, unspecified: Secondary | ICD-10-CM

## 2014-07-11 DIAGNOSIS — R001 Bradycardia, unspecified: Secondary | ICD-10-CM

## 2014-07-11 DIAGNOSIS — I10 Essential (primary) hypertension: Secondary | ICD-10-CM

## 2014-07-11 DIAGNOSIS — Z87442 Personal history of urinary calculi: Secondary | ICD-10-CM

## 2014-07-11 LAB — CBC WITH DIFFERENTIAL/PLATELET
BASOS PCT: 0 % (ref 0–1)
Basophils Absolute: 0 10*3/uL (ref 0.0–0.1)
EOS PCT: 0 % (ref 0–5)
Eosinophils Absolute: 0 10*3/uL (ref 0.0–0.7)
HCT: 38 % (ref 36.0–46.0)
HEMOGLOBIN: 12.3 g/dL (ref 12.0–15.0)
LYMPHS PCT: 8 % — AB (ref 12–46)
Lymphs Abs: 0.4 10*3/uL — ABNORMAL LOW (ref 0.7–4.0)
MCH: 29.4 pg (ref 26.0–34.0)
MCHC: 32.4 g/dL (ref 30.0–36.0)
MCV: 90.9 fL (ref 78.0–100.0)
Monocytes Absolute: 0.1 10*3/uL (ref 0.1–1.0)
Monocytes Relative: 2 % — ABNORMAL LOW (ref 3–12)
NEUTROS PCT: 90 % — AB (ref 43–77)
Neutro Abs: 4.9 10*3/uL (ref 1.7–7.7)
Platelets: 95 10*3/uL — ABNORMAL LOW (ref 150–400)
RBC: 4.18 MIL/uL (ref 3.87–5.11)
RDW: 14 % (ref 11.5–15.5)
WBC MORPHOLOGY: INCREASED
WBC: 5.4 10*3/uL (ref 4.0–10.5)

## 2014-07-11 LAB — URINALYSIS, ROUTINE W REFLEX MICROSCOPIC
GLUCOSE, UA: NEGATIVE mg/dL
Ketones, ur: 15 mg/dL — AB
Nitrite: NEGATIVE
Protein, ur: 100 mg/dL — AB
SPECIFIC GRAVITY, URINE: 1.014 (ref 1.005–1.030)
UROBILINOGEN UA: 1 mg/dL (ref 0.0–1.0)
pH: 5.5 (ref 5.0–8.0)

## 2014-07-11 LAB — URINE MICROSCOPIC-ADD ON

## 2014-07-11 LAB — I-STAT CG4 LACTIC ACID, ED: Lactic Acid, Venous: 7.35 mmol/L — ABNORMAL HIGH (ref 0.5–2.2)

## 2014-07-11 LAB — COMPREHENSIVE METABOLIC PANEL
ALT: 12 U/L (ref 0–35)
AST: 48 U/L — AB (ref 0–37)
Albumin: 2.2 g/dL — ABNORMAL LOW (ref 3.5–5.2)
Alkaline Phosphatase: 71 U/L (ref 39–117)
Anion gap: 18 — ABNORMAL HIGH (ref 5–15)
BUN: 19 mg/dL (ref 6–23)
CO2: 16 meq/L — AB (ref 19–32)
Calcium: 8.5 mg/dL (ref 8.4–10.5)
Chloride: 110 mEq/L (ref 96–112)
Creatinine, Ser: 1.28 mg/dL — ABNORMAL HIGH (ref 0.50–1.10)
GFR calc Af Amer: 44 mL/min — ABNORMAL LOW (ref >90)
GFR, EST NON AFRICAN AMERICAN: 38 mL/min — AB (ref >90)
Glucose, Bld: 94 mg/dL (ref 70–99)
Potassium: 3.2 mEq/L — ABNORMAL LOW (ref 3.7–5.3)
SODIUM: 144 meq/L (ref 137–147)
TOTAL PROTEIN: 4.8 g/dL — AB (ref 6.0–8.3)
Total Bilirubin: 1.6 mg/dL — ABNORMAL HIGH (ref 0.3–1.2)

## 2014-07-11 MED ORDER — DEXTROSE 5 % IV SOLN
1.0000 g | Freq: Once | INTRAVENOUS | Status: AC
  Administered 2014-07-11: 1 g via INTRAVENOUS
  Filled 2014-07-11: qty 10

## 2014-07-11 MED ORDER — SODIUM CHLORIDE 0.9 % IV SOLN: 1000.0000 mL | INTRAVENOUS | Status: DC

## 2014-07-11 MED ORDER — SODIUM CHLORIDE 0.9 % IV BOLUS (SEPSIS): 30.0000 mL/kg | Freq: Once | INTRAVENOUS | Status: DC

## 2014-07-11 MED ORDER — SODIUM CHLORIDE 0.9 % IV BOLUS (SEPSIS)
1000.0000 mL | Freq: Once | INTRAVENOUS | Status: AC
  Administered 2014-07-11: 1000 mL via INTRAVENOUS

## 2014-07-11 MED ORDER — SODIUM CHLORIDE 0.9 % IV SOLN
1000.0000 mL | INTRAVENOUS | Status: DC
Start: 2014-07-11 — End: 2014-07-12
  Administered 2014-07-11: 1000 mL via INTRAVENOUS

## 2014-07-11 MED ORDER — SODIUM CHLORIDE 0.9 % IV BOLUS (SEPSIS)
30.0000 mL/kg | Freq: Once | INTRAVENOUS | Status: AC
  Administered 2014-07-11: 1647 mL via INTRAVENOUS

## 2014-07-11 MED ORDER — ACETAMINOPHEN 325 MG PO TABS
650.0000 mg | ORAL_TABLET | Freq: Four times a day (QID) | ORAL | Status: DC | PRN
  Administered 2014-07-11: 650 mg via ORAL
  Filled 2014-07-11: qty 2

## 2014-07-11 NOTE — ED Provider Notes (Signed)
CSN: [id number]     Arrival date & time 07/11/14  2150 History   First MD Initiated Contact with Patient 07/11/14 2206     Chief Complaint  Patient presents with  . Altered Mental Status     (Consider location/radiation/quality/duration/timing/severity/associated sxs/prior Treatment) Patient is a 80 y.o. female presenting with altered mental status. The history is provided by the patient, a caregiver and the spouse.  Altered Mental Status Presenting symptoms: confusion and partial responsiveness   Severity:  Moderate Most recent episode:  Yesterday Episode history:  Single Timing:  Constant Progression:  Worsening Chronicity:  Recurrent Context: dementia   Context: not a recent illness and not a recent infection   Associated symptoms: no fever     Past Medical History  Diagnosis Date  . Long-term (current) use of anticoagulants   . Chronic shoulder pain   . Heart palpitations     rare  . Memory problem     "a little short term and long term memory loss" (03/31/2014)  . Depression   . Hyperlipidemia   . Dyslipidemia   . Seasonal allergies   . Anxiety   . Hypertension   . CHF (congestive heart failure)   . Atrial fibrillation   . Humerus fracture 03/31/2014    "fell and broke left arm"  . Kidney stones   . Anginal pain   . Blood in urine     "chronic" (03/31/2014)  . Arthritis     "joints" (03/31/2014)  . Osteopenia     "severe" (03/31/2014)  . Syncope and collapse 03/31/2014  . Alzheimer's dementia 03/31/2014   Past Surgical History  Procedure Laterality Date  . Ureteral stent placement Bilateral     for stenosis; "they've been removed"  . Removal of growth  2007    growth under kidney removed and possibly kindey stones removed  . Knee arthroscopy Right 2011  . Total shoulder arthroplasty Right 10/2007  . Nephrolithotomy Right 06/04/2013    Procedure: NEPHROLITHOTOMY PERCUTANEOUS;  Surgeon: Franchot Gallo, MD;  Location: [facility];  Service: Urology;  Laterality:  Right;  . Appendectomy  ~ 1953  . Bladder suspension      w/hysterectomy  . Cardioversion  2006  . Meniscus repair Left 07/2000  . Abdominal hysterectomy      partial   Family History  Problem Relation Age of Onset  . Heart disease Mother    History  Substance Use Topics  . Smoking status: Former Smoker -- 0.25 packs/day for 5 years    Types: Cigarettes    Quit date: 07/20/1981  . Smokeless tobacco: Never Used  . Alcohol Use: No   OB History   Grav Para Term Preterm Abortions TAB SAB Ect Mult Living                 Review of Systems  Constitutional: Negative for fever and chills.  Respiratory: Negative for cough and shortness of breath.   Psychiatric/Behavioral: Positive for confusion.  All other systems reviewed and are negative.     Allergies  Sulfa antibiotics and Zocor  Home Medications   Prior to Admission medications   Medication Sig Start Date End Date Taking? Authorizing Provider  Ascorbic Acid (VITAMIN C PO) Take by mouth.   Yes Historical Provider, MD  Calcium Citrate (CALCITRATE PO) Take 1 tablet by mouth daily.   Yes Historical Provider, MD  diazepam (VALIUM) 5 MG tablet Take 2.5-5 mg by mouth every 12 (twelve) hours as needed for anxiety.   Yes  Historical Provider, MD  digoxin (LANOXIN) 0.125 MG tablet Take 0.125 mg by mouth 3 (three) times a week. 1 pill on Monday, Wednesday, and Friday 01/14/13  Yes Marton Redwood, MD  donepezil (ARICEPT) 10 MG tablet Take 10 mg by mouth at bedtime.    Yes Historical Provider, MD  HYDROcodone-acetaminophen (NORCO/VICODIN) 5-325 MG per tablet Take 1-2 tablets by mouth every 4 (four) hours as needed for moderate pain. 04/02/14  Yes Marton Redwood, MD  lactose free nutrition (BOOST) LIQD Take 237 mLs by mouth daily as needed (for nutrition).   Yes Historical Provider, MD  metoprolol tartrate (LOPRESSOR) 25 MG tablet Take 12.5 mg by mouth every morning.   Yes Historical Provider, MD  Multiple Vitamin (MULTIVITAMIN WITH MINERALS)  TABS Take 1 tablet by mouth daily.   Yes Historical Provider, MD  Omega-3 Fatty Acids (FISH OIL PO) Take 1 capsule by mouth daily.   Yes Historical Provider, MD  ondansetron (ZOFRAN) 4 MG tablet Take 1 tablet (4 mg total) by mouth every 8 (eight) hours as needed for nausea or vomiting. 03/17/14  Yes Gregor Hams, MD  Rivaroxaban (XARELTO) 20 MG TABS tablet Take 1 tablet (20 mg total) by mouth daily. 06/24/13  Yes Darlin Coco, MD   BP 74/46  Pulse 95  Temp(Src) 104 F (40 C) (Rectal)  Resp 20  SpO2 99% Physical Exam  Nursing note and vitals reviewed. Constitutional: She appears well-developed and well-nourished. She appears distressed.  HENT:  Head: Normocephalic and atraumatic.  Mouth/Throat: Oropharynx is clear and moist.  Eyes: EOM are normal. Pupils are equal, round, and reactive to light.  Neck: Normal range of motion. Neck supple.  Cardiovascular: Normal rate and regular rhythm.  Exam reveals no friction rub.   No murmur heard. Pulmonary/Chest: Effort normal and breath sounds normal. No respiratory distress. She has no wheezes. She has no rales.  Abdominal: Soft. She exhibits no distension. There is no tenderness. There is no rebound.  Musculoskeletal: Normal range of motion. She exhibits no edema.  Neurological: She is alert. No cranial nerve deficit. She exhibits normal muscle tone. Coordination normal.  Disoriented  Skin: No rash noted. She is not diaphoretic.    ED Course  Procedures (including critical care time) Labs Review Labs Reviewed  CBC WITH DIFFERENTIAL - Abnormal; Notable for the following:    Platelets 95 (*)    All other components within normal limits  COMPREHENSIVE METABOLIC PANEL - Abnormal; Notable for the following:    Potassium 3.2 (*)    CO2 16 (*)    Creatinine, Ser 1.28 (*)    Total Protein 4.8 (*)    Albumin 2.2 (*)    AST 48 (*)    Total Bilirubin 1.6 (*)    GFR calc non Af Amer 38 (*)    GFR calc Af Amer 44 (*)    Anion gap 18 (*)     All other components within normal limits  URINALYSIS, ROUTINE W REFLEX MICROSCOPIC - Abnormal; Notable for the following:    Color, Urine RED (*)    APPearance TURBID (*)    Hgb urine dipstick LARGE (*)    Bilirubin Urine MODERATE (*)    Ketones, ur 15 (*)    Protein, ur 100 (*)    Leukocytes, UA MODERATE (*)    All other components within normal limits  URINE MICROSCOPIC-ADD ON - Abnormal; Notable for the following:    Bacteria, UA MANY (*)    All other components within normal limits  I-STAT CG4 LACTIC ACID, ED - Abnormal; Notable for the following:    Lactic Acid, Venous 7.35 (*)    All other components within normal limits  CULTURE, BLOOD (ROUTINE X 2)  CULTURE, BLOOD (ROUTINE X 2)  URINE CULTURE    Imaging Review Dg Chest Port 1 View  07/11/2014   CLINICAL DATA:  Altered mental status.  EXAM: PORTABLE CHEST - 1 VIEW  COMPARISON:  03/31/2014  FINDINGS: Mild cardiac enlargement without vascular congestion. Visualization of the left lung base is limited because the patient's hand is superimposed. Infiltration or atelectasis in the left base is not excluded. No focal consolidation on the right. No pneumothorax. Calcification of the aorta. Postoperative changes in the right shoulder. Thoracic curvature convex towards the right with suggestion of sclerotic vertebrae in the mid/ upper thoracic region. Portion of fracture in the left proximal humerus is again demonstrated.  IMPRESSION: No definite evidence of active pulmonary disease although infiltration or atelectasis cannot be excluded from the left lung base. Appearance of sclerosis in a mid/upper thoracic vertebra. Consider degenerative or metastatic disease.   Electronically Signed   By: Lucienne Capers M.D.   On: 07/11/2014 22:52     EKG Interpretation   Date/Time:  Sunday July 11 2014 22:00:02 EDT Ventricular Rate:  100 PR Interval:    QRS Duration: 125 QT Interval:  390 QTC Calculation: 503 R Axis:   30 Text  Interpretation:  Atrial fibrillation Right bundle branch block  Similar to prior Confirmed by Mingo Amber  MD, [place] ([id number]) on 07/11/2014  10:21:15 PM      CRITICAL CARE Performed by: Osvaldo Shipper   Total critical care time: 45 minutes  Critical care time was exclusive of separately billable procedures and treating other patients.  Critical care was necessary to treat or prevent imminent or life-threatening deterioration.  Critical care was time spent personally by me on the following activities: development of treatment plan with patient and/or surrogate as well as nursing, discussions with consultants, evaluation of patient's response to treatment, examination of patient, obtaining history from patient or surrogate, ordering and performing treatments and interventions, ordering and review of laboratory studies, ordering and review of radiographic studies, pulse oximetry and re-evaluation of patient's condition.  MDM   Final diagnoses:  Septic shock  UTI (lower urinary tract infection)  Acute renal insufficiency  Fever, unspecified fever cause  Altered mental status, unspecified altered mental status type    71F with hx of dementia, recurrent UTIs present with altered mental status and fever. Confusion last night, fever today. No N/V/D. Pleasantly demented at baseline. Here hypotensive, lethargic but alert and can follow commands. Disoriented per baseline. With hx of stones will get CT abdomen to look for possible retained stone.  Lactate 7.35. I spoke with Dr. Titus Mould who asked for repeat lactate after fluids.  Labs returning, no white count, mild bump in creatinine, CO2 16. Patient perking up with fluids, anti-pyretics.  After third liter, pressures still hanging in the 70s. Repeat lactate same. Dr. Titus Mould admitting to ICU, he recommended starting peripheral neosynephrine.     Evelina Bucy, MD 07/12/14 [phone]

## 2014-07-11 NOTE — ED Notes (Signed)
Per family and EMS, pt has been altered today after pain medicine. Is prone to UTIs and presents with fever of 104.

## 2014-07-12 ENCOUNTER — Inpatient Hospital Stay (HOSPITAL_COMMUNITY): Payer: Medicare Other

## 2014-07-12 ENCOUNTER — Encounter (HOSPITAL_COMMUNITY): Payer: Self-pay | Admitting: Radiology

## 2014-07-12 DIAGNOSIS — N12 Tubulo-interstitial nephritis, not specified as acute or chronic: Secondary | ICD-10-CM | POA: Diagnosis present

## 2014-07-12 DIAGNOSIS — E872 Acidosis, unspecified: Secondary | ICD-10-CM | POA: Diagnosis present

## 2014-07-12 DIAGNOSIS — T380X5A Adverse effect of glucocorticoids and synthetic analogues, initial encounter: Secondary | ICD-10-CM | POA: Diagnosis present

## 2014-07-12 DIAGNOSIS — Z87442 Personal history of urinary calculi: Secondary | ICD-10-CM | POA: Diagnosis not present

## 2014-07-12 DIAGNOSIS — J9600 Acute respiratory failure, unspecified whether with hypoxia or hypercapnia: Secondary | ICD-10-CM

## 2014-07-12 DIAGNOSIS — R7309 Other abnormal glucose: Secondary | ICD-10-CM | POA: Diagnosis present

## 2014-07-12 DIAGNOSIS — R6521 Severe sepsis with septic shock: Secondary | ICD-10-CM

## 2014-07-12 DIAGNOSIS — F3289 Other specified depressive episodes: Secondary | ICD-10-CM | POA: Diagnosis present

## 2014-07-12 DIAGNOSIS — E876 Hypokalemia: Secondary | ICD-10-CM | POA: Diagnosis present

## 2014-07-12 DIAGNOSIS — Z882 Allergy status to sulfonamides status: Secondary | ICD-10-CM | POA: Diagnosis not present

## 2014-07-12 DIAGNOSIS — G9341 Metabolic encephalopathy: Secondary | ICD-10-CM | POA: Diagnosis present

## 2014-07-12 DIAGNOSIS — Z96619 Presence of unspecified artificial shoulder joint: Secondary | ICD-10-CM | POA: Diagnosis not present

## 2014-07-12 DIAGNOSIS — I4891 Unspecified atrial fibrillation: Secondary | ICD-10-CM | POA: Diagnosis present

## 2014-07-12 DIAGNOSIS — G309 Alzheimer's disease, unspecified: Secondary | ICD-10-CM | POA: Diagnosis present

## 2014-07-12 DIAGNOSIS — A419 Sepsis, unspecified organism: Secondary | ICD-10-CM | POA: Diagnosis present

## 2014-07-12 DIAGNOSIS — R6520 Severe sepsis without septic shock: Secondary | ICD-10-CM

## 2014-07-12 DIAGNOSIS — N1330 Unspecified hydronephrosis: Secondary | ICD-10-CM | POA: Diagnosis present

## 2014-07-12 DIAGNOSIS — Z8249 Family history of ischemic heart disease and other diseases of the circulatory system: Secondary | ICD-10-CM | POA: Diagnosis not present

## 2014-07-12 DIAGNOSIS — N170 Acute kidney failure with tubular necrosis: Secondary | ICD-10-CM | POA: Diagnosis present

## 2014-07-12 DIAGNOSIS — Z87891 Personal history of nicotine dependence: Secondary | ICD-10-CM | POA: Diagnosis not present

## 2014-07-12 DIAGNOSIS — N201 Calculus of ureter: Secondary | ICD-10-CM | POA: Diagnosis present

## 2014-07-12 DIAGNOSIS — F0280 Dementia in other diseases classified elsewhere without behavioral disturbance: Secondary | ICD-10-CM | POA: Diagnosis present

## 2014-07-12 DIAGNOSIS — E785 Hyperlipidemia, unspecified: Secondary | ICD-10-CM | POA: Diagnosis present

## 2014-07-12 DIAGNOSIS — D65 Disseminated intravascular coagulation [defibrination syndrome]: Secondary | ICD-10-CM | POA: Diagnosis present

## 2014-07-12 DIAGNOSIS — Z888 Allergy status to other drugs, medicaments and biological substances status: Secondary | ICD-10-CM | POA: Diagnosis not present

## 2014-07-12 DIAGNOSIS — A4159 Other Gram-negative sepsis: Secondary | ICD-10-CM | POA: Diagnosis present

## 2014-07-12 DIAGNOSIS — R4182 Altered mental status, unspecified: Secondary | ICD-10-CM | POA: Diagnosis present

## 2014-07-12 DIAGNOSIS — Z7901 Long term (current) use of anticoagulants: Secondary | ICD-10-CM | POA: Diagnosis not present

## 2014-07-12 DIAGNOSIS — B9689 Other specified bacterial agents as the cause of diseases classified elsewhere: Secondary | ICD-10-CM | POA: Diagnosis present

## 2014-07-12 DIAGNOSIS — F411 Generalized anxiety disorder: Secondary | ICD-10-CM | POA: Diagnosis present

## 2014-07-12 DIAGNOSIS — Z79899 Other long term (current) drug therapy: Secondary | ICD-10-CM | POA: Diagnosis not present

## 2014-07-12 DIAGNOSIS — F329 Major depressive disorder, single episode, unspecified: Secondary | ICD-10-CM | POA: Diagnosis present

## 2014-07-12 LAB — POCT I-STAT 3, ART BLOOD GAS (G3+)
ACID-BASE DEFICIT: 10 mmol/L — AB (ref 0.0–2.0)
Acid-base deficit: 14 mmol/L — ABNORMAL HIGH (ref 0.0–2.0)
BICARBONATE: 11.9 meq/L — AB (ref 20.0–24.0)
Bicarbonate: 15.5 mEq/L — ABNORMAL LOW (ref 20.0–24.0)
O2 SAT: 97 %
O2 Saturation: 100 %
PCO2 ART: 27.2 mmHg — AB (ref 35.0–45.0)
PH ART: 7.248 — AB (ref 7.350–7.450)
PO2 ART: 191 mmHg — AB (ref 80.0–100.0)
Patient temperature: 97.9
TCO2: 17 mmol/L (ref 0–100)
TCO2: 13 mmol/L (ref 0–100)
pCO2 arterial: 33.2 mmHg — ABNORMAL LOW (ref 35.0–45.0)
pH, Arterial: 7.274 — ABNORMAL LOW (ref 7.350–7.450)
pO2, Arterial: 104 mmHg — ABNORMAL HIGH (ref 80.0–100.0)

## 2014-07-12 LAB — GLUCOSE, CAPILLARY
GLUCOSE-CAPILLARY: 82 mg/dL (ref 70–99)
GLUCOSE-CAPILLARY: 198 mg/dL — AB (ref 70–99)
Glucose-Capillary: 102 mg/dL — ABNORMAL HIGH (ref 70–99)
Glucose-Capillary: 102 mg/dL — ABNORMAL HIGH (ref 70–99)

## 2014-07-12 LAB — CBC
HEMATOCRIT: 37.8 % (ref 36.0–46.0)
HEMATOCRIT: 40.8 % (ref 36.0–46.0)
HEMOGLOBIN: 13.2 g/dL (ref 12.0–15.0)
Hemoglobin: 12.3 g/dL (ref 12.0–15.0)
MCH: 29.3 pg (ref 26.0–34.0)
MCH: 29.5 pg (ref 26.0–34.0)
MCHC: 32.5 g/dL (ref 30.0–36.0)
MCHC: 32.4 g/dL (ref 30.0–36.0)
MCV: 90 fL (ref 78.0–100.0)
MCV: 91.3 fL (ref 78.0–100.0)
PLATELETS: 110 10*3/uL — AB (ref 150–400)
Platelets: 88 10*3/uL — ABNORMAL LOW (ref 150–400)
RBC: 4.2 MIL/uL (ref 3.87–5.11)
RBC: 4.47 MIL/uL (ref 3.87–5.11)
RDW: 14.3 % (ref 11.5–15.5)
RDW: 14.4 % (ref 11.5–15.5)
WBC: 15.1 10*3/uL — ABNORMAL HIGH (ref 4.0–10.5)
WBC: 21.9 10*3/uL — ABNORMAL HIGH (ref 4.0–10.5)

## 2014-07-12 LAB — COMPREHENSIVE METABOLIC PANEL
ALT: 17 U/L (ref 0–35)
AST: 55 U/L — ABNORMAL HIGH (ref 0–37)
Albumin: 2 g/dL — ABNORMAL LOW (ref 3.5–5.2)
Alkaline Phosphatase: 42 U/L (ref 39–117)
Anion gap: 16 — ABNORMAL HIGH (ref 5–15)
BUN: 19 mg/dL (ref 6–23)
CO2: 11 meq/L — AB (ref 19–32)
CREATININE: 1.15 mg/dL — AB (ref 0.50–1.10)
Calcium: 6.5 mg/dL — ABNORMAL LOW (ref 8.4–10.5)
Chloride: 112 mEq/L (ref 96–112)
GFR, EST AFRICAN AMERICAN: 50 mL/min — AB (ref >90)
GFR, EST NON AFRICAN AMERICAN: 43 mL/min — AB (ref >90)
GLUCOSE: 236 mg/dL — AB (ref 70–99)
Potassium: 3.3 mEq/L — ABNORMAL LOW (ref 3.7–5.3)
Sodium: 139 mEq/L (ref 137–147)
Total Bilirubin: 1.4 mg/dL — ABNORMAL HIGH (ref 0.3–1.2)
Total Protein: 4.4 g/dL — ABNORMAL LOW (ref 6.0–8.3)

## 2014-07-12 LAB — I-STAT ARTERIAL BLOOD GAS, ED
ACID-BASE DEFICIT: 14 mmol/L — AB (ref 0.0–2.0)
BICARBONATE: 12.2 meq/L — AB (ref 20.0–24.0)
O2 Saturation: 95 %
PO2 ART: 86 mmHg (ref 80.0–100.0)
TCO2: 13 mmol/L (ref 0–100)
pCO2 arterial: 27.7 mmHg — ABNORMAL LOW (ref 35.0–45.0)
pH, Arterial: 7.25 — ABNORMAL LOW (ref 7.350–7.450)

## 2014-07-12 LAB — BASIC METABOLIC PANEL
Anion gap: 13 (ref 5–15)
BUN: 19 mg/dL (ref 6–23)
CO2: 13 meq/L — AB (ref 19–32)
Calcium: 6.6 mg/dL — ABNORMAL LOW (ref 8.4–10.5)
Chloride: 117 mEq/L — ABNORMAL HIGH (ref 96–112)
Creatinine, Ser: 1.12 mg/dL — ABNORMAL HIGH (ref 0.50–1.10)
GFR calc Af Amer: 51 mL/min — ABNORMAL LOW (ref >90)
GFR calc non Af Amer: 44 mL/min — ABNORMAL LOW (ref >90)
GLUCOSE: 121 mg/dL — AB (ref 70–99)
POTASSIUM: 3 meq/L — AB (ref 3.7–5.3)
Sodium: 143 mEq/L (ref 137–147)

## 2014-07-12 LAB — I-STAT CG4 LACTIC ACID, ED: Lactic Acid, Venous: 7.3 mmol/L — ABNORMAL HIGH (ref 0.5–2.2)

## 2014-07-12 LAB — CORTISOL
CORTISOL PLASMA: 101.5 ug/dL
Cortisol, Plasma: 67.4 ug/dL

## 2014-07-12 LAB — APTT
APTT: 50 s — AB (ref 24–37)
aPTT: 45 seconds — ABNORMAL HIGH (ref 24–37)

## 2014-07-12 LAB — URINALYSIS, ROUTINE W REFLEX MICROSCOPIC
Glucose, UA: NEGATIVE mg/dL
KETONES UR: 15 mg/dL — AB
NITRITE: POSITIVE — AB
Protein, ur: 100 mg/dL — AB
Specific Gravity, Urine: 1.025 (ref 1.005–1.030)
Urobilinogen, UA: 1 mg/dL (ref 0.0–1.0)
pH: 5.5 (ref 5.0–8.0)

## 2014-07-12 LAB — URINE MICROSCOPIC-ADD ON

## 2014-07-12 LAB — LACTIC ACID, PLASMA
LACTIC ACID, VENOUS: 3.5 mmol/L — AB (ref 0.5–2.2)
LACTIC ACID, VENOUS: 4 mmol/L — AB (ref 0.5–2.2)

## 2014-07-12 LAB — PROTIME-INR
INR: 3.57 — ABNORMAL HIGH (ref 0.00–1.49)
INR: 3.31 — AB (ref 0.00–1.49)
Prothrombin Time: 35.7 seconds — ABNORMAL HIGH (ref 11.6–15.2)
Prothrombin Time: 33.6 seconds — ABNORMAL HIGH (ref 11.6–15.2)

## 2014-07-12 LAB — MRSA PCR SCREENING: MRSA BY PCR: NEGATIVE

## 2014-07-12 LAB — TYPE AND SCREEN
ABO/RH(D): O POS
Antibody Screen: NEGATIVE

## 2014-07-12 LAB — PHOSPHORUS: Phosphorus: 2.7 mg/dL (ref 2.3–4.6)

## 2014-07-12 LAB — FIBRINOGEN: Fibrinogen: 394 mg/dL (ref 204–475)

## 2014-07-12 LAB — DIGOXIN LEVEL

## 2014-07-12 LAB — TSH: TSH: 1.17 u[IU]/mL (ref 0.350–4.500)

## 2014-07-12 LAB — MAGNESIUM: Magnesium: 1.3 mg/dL — ABNORMAL LOW (ref 1.5–2.5)

## 2014-07-12 LAB — TROPONIN I

## 2014-07-12 MED ORDER — FENTANYL CITRATE 0.05 MG/ML IJ SOLN
INTRAMUSCULAR | Status: AC
  Filled 2014-07-12: qty 2

## 2014-07-12 MED ORDER — FENTANYL CITRATE 0.05 MG/ML IJ SOLN
INTRAMUSCULAR | Status: AC | PRN
  Administered 2014-07-12: 12.5 ug via INTRAVENOUS

## 2014-07-12 MED ORDER — PANTOPRAZOLE SODIUM 40 MG IV SOLR
40.0000 mg | Freq: Every day | INTRAVENOUS | Status: DC
  Administered 2014-07-12 – 2014-07-13 (×3): 40 mg via INTRAVENOUS
  Filled 2014-07-12 (×5): qty 40

## 2014-07-12 MED ORDER — VITAL AF 1.2 CAL PO LIQD
1000.0000 mL | ORAL | Status: DC
  Administered 2014-07-12: 1000 mL
  Filled 2014-07-12: qty 1000

## 2014-07-12 MED ORDER — POTASSIUM CHLORIDE 10 MEQ/100ML IV SOLN
10.0000 meq | INTRAVENOUS | Status: AC
  Administered 2014-07-12 (×4): 10 meq via INTRAVENOUS
  Filled 2014-07-12 (×3): qty 100

## 2014-07-12 MED ORDER — SODIUM CHLORIDE 0.9 % IV SOLN: INTRAVENOUS | Status: DC

## 2014-07-12 MED ORDER — MIDAZOLAM HCL 2 MG/2ML IJ SOLN
INTRAMUSCULAR | Status: AC
  Filled 2014-07-12: qty 2

## 2014-07-12 MED ORDER — SODIUM CHLORIDE 0.9 % IV BOLUS (SEPSIS): 1000.0000 mL | INTRAVENOUS | Status: DC | PRN

## 2014-07-12 MED ORDER — SODIUM CHLORIDE 0.9 % IV SOLN
INTRAVENOUS | Status: DC
  Administered 2014-07-20: 13:00:00 via INTRAVENOUS

## 2014-07-12 MED ORDER — SODIUM CHLORIDE 0.9 % IV BOLUS (SEPSIS)
1000.0000 mL | Freq: Once | INTRAVENOUS | Status: AC
  Administered 2014-07-12: 1000 mL via INTRAVENOUS

## 2014-07-12 MED ORDER — MIDAZOLAM HCL 2 MG/2ML IJ SOLN
1.0000 mg | INTRAMUSCULAR | Status: DC | PRN
Start: 2014-07-12 — End: 2014-07-13
  Administered 2014-07-13 (×2): 2 mg via INTRAVENOUS
  Filled 2014-07-12 (×2): qty 2

## 2014-07-12 MED ORDER — PHENYLEPHRINE HCL 10 MG/ML IJ SOLN
20.0000 ug/min | INTRAVENOUS | Status: DC
  Administered 2014-07-12: 20 ug/min via INTRAVENOUS
  Administered 2014-07-12: 150 ug/min via INTRAVENOUS
  Filled 2014-07-12 (×3): qty 1

## 2014-07-12 MED ORDER — PHENYLEPHRINE HCL 10 MG/ML IJ SOLN
0.1000 mg | Freq: Once | INTRAMUSCULAR | Status: AC
  Administered 2014-07-12: 0.1 mg via INTRAVENOUS
  Filled 2014-07-12: qty 0.01

## 2014-07-12 MED ORDER — LIDOCAINE HCL 1 % IJ SOLN
INTRAMUSCULAR | Status: AC
  Filled 2014-07-12: qty 20

## 2014-07-12 MED ORDER — SODIUM CHLORIDE 0.9 % IV BOLUS (SEPSIS)
2000.0000 mL | Freq: Once | INTRAVENOUS | Status: AC
  Administered 2014-07-12: 2000 mL via INTRAVENOUS

## 2014-07-12 MED ORDER — SODIUM CHLORIDE 0.9 % IV SOLN: Freq: Once | INTRAVENOUS | Status: DC

## 2014-07-12 MED ORDER — PRO-STAT SUGAR FREE PO LIQD
30.0000 mL | Freq: Two times a day (BID) | ORAL | Status: DC
  Administered 2014-07-12 – 2014-07-13 (×3): 30 mL
  Filled 2014-07-12 (×4): qty 30

## 2014-07-12 MED ORDER — VITAL HIGH PROTEIN PO LIQD
1000.0000 mL | ORAL | Status: DC
  Filled 2014-07-12 (×2): qty 1000

## 2014-07-12 MED ORDER — NOREPINEPHRINE BITARTRATE 1 MG/ML IV SOLN
2.0000 ug/min | INTRAVENOUS | Status: DC
  Administered 2014-07-12: 10 ug/min via INTRAVENOUS
  Administered 2014-07-12: 15 ug/min via INTRAVENOUS
  Administered 2014-07-12 – 2014-07-13 (×3): 10 ug/min via INTRAVENOUS
  Filled 2014-07-12 (×4): qty 4

## 2014-07-12 MED ORDER — SODIUM CHLORIDE 0.9 % IV BOLUS (SEPSIS): 500.0000 mL | Freq: Once | INTRAVENOUS | Status: DC

## 2014-07-12 MED ORDER — MIDAZOLAM HCL 2 MG/2ML IJ SOLN
INTRAMUSCULAR | Status: AC | PRN
  Administered 2014-07-12: 0.5 mg via INTRAVENOUS

## 2014-07-12 MED ORDER — VANCOMYCIN HCL IN DEXTROSE 1-5 GM/200ML-% IV SOLN: 1000.0000 mg | Freq: Once | INTRAVENOUS | Status: DC

## 2014-07-12 MED ORDER — VANCOMYCIN HCL 500 MG IV SOLR
500.0000 mg | INTRAVENOUS | Status: DC
  Administered 2014-07-13: 500 mg via INTRAVENOUS
  Filled 2014-07-12: qty 500

## 2014-07-12 MED ORDER — STERILE WATER FOR INJECTION IV SOLN
INTRAVENOUS | Status: DC
  Administered 2014-07-12 (×2): via INTRAVENOUS
  Filled 2014-07-12 (×4): qty 850

## 2014-07-12 MED ORDER — POTASSIUM CHLORIDE 10 MEQ/100ML IV SOLN
10.0000 meq | INTRAVENOUS | Status: AC
  Administered 2014-07-12: 10 meq via INTRAVENOUS
  Filled 2014-07-12: qty 100

## 2014-07-12 MED ORDER — ASPIRIN 81 MG PO CHEW
324.0000 mg | CHEWABLE_TABLET | ORAL | Status: AC
  Administered 2014-07-12: 324 mg via ORAL
  Filled 2014-07-12: qty 4

## 2014-07-12 MED ORDER — FENTANYL CITRATE 0.05 MG/ML IJ SOLN
100.0000 ug | Freq: Once | INTRAMUSCULAR | Status: AC
  Administered 2014-07-12: 100 ug via INTRAVENOUS

## 2014-07-12 MED ORDER — PHENYLEPHRINE 200 MCG/ML FOR PRIAPISM / HYPOTENSION: 50.0000 ug | Freq: Once | INTRAMUSCULAR | Status: DC

## 2014-07-12 MED ORDER — VANCOMYCIN HCL IN DEXTROSE 1-5 GM/200ML-% IV SOLN
1000.0000 mg | Freq: Once | INTRAVENOUS | Status: AC
  Administered 2014-07-12: 1000 mg via INTRAVENOUS
  Filled 2014-07-12: qty 200

## 2014-07-12 MED ORDER — SODIUM CHLORIDE 0.9 % IV SOLN
25.0000 ug/h | INTRAVENOUS | Status: DC
  Administered 2014-07-12: 100 ug/h via INTRAVENOUS
  Administered 2014-07-12: 50 ug via INTRAVENOUS
  Filled 2014-07-12 (×2): qty 50

## 2014-07-12 MED ORDER — SODIUM CHLORIDE 0.9 % IV BOLUS (SEPSIS)
1000.0000 mL | INTRAVENOUS | Status: DC | PRN
  Administered 2014-07-12: 1000 mL via INTRAVENOUS

## 2014-07-12 MED ORDER — POTASSIUM CHLORIDE 10 MEQ/100ML IV SOLN
10.0000 meq | INTRAVENOUS | Status: DC
  Administered 2014-07-12 (×2): 10 meq via INTRAVENOUS
  Filled 2014-07-12 (×2): qty 100

## 2014-07-12 MED ORDER — HEPARIN SODIUM (PORCINE) 5000 UNIT/ML IJ SOLN: 5000.0000 [IU] | Freq: Three times a day (TID) | INTRAMUSCULAR | Status: DC

## 2014-07-12 MED ORDER — DIAZEPAM 5 MG PO TABS: 2.5000 mg | ORAL_TABLET | Freq: Two times a day (BID) | ORAL | Status: DC | PRN

## 2014-07-12 MED ORDER — SODIUM CHLORIDE 0.9 % IV SOLN: 250.0000 mL | INTRAVENOUS | Status: DC | PRN

## 2014-07-12 MED ORDER — IOHEXOL 300 MG/ML  SOLN
50.0000 mL | Freq: Once | INTRAMUSCULAR | Status: AC | PRN
  Administered 2014-07-12: 25 mL via INTRAVENOUS

## 2014-07-12 MED ORDER — VITAMIN K1 10 MG/ML IJ SOLN
10.0000 mg | Freq: Every day | INTRAVENOUS | Status: AC
  Administered 2014-07-12 – 2014-07-14 (×3): 10 mg via INTRAVENOUS
  Filled 2014-07-12 (×3): qty 1

## 2014-07-12 MED ORDER — HYDROCORTISONE NA SUCCINATE PF 100 MG IJ SOLR
50.0000 mg | Freq: Four times a day (QID) | INTRAMUSCULAR | Status: DC
  Administered 2014-07-12 – 2014-07-13 (×6): 50 mg via INTRAVENOUS
  Filled 2014-07-12 (×10): qty 1

## 2014-07-12 MED ORDER — CEFTAZIDIME 1 G IJ SOLR
1.0000 g | INTRAMUSCULAR | Status: DC
  Administered 2014-07-12 – 2014-07-13 (×2): 1 g via INTRAVENOUS
  Filled 2014-07-12 (×2): qty 1

## 2014-07-12 MED ORDER — FENTANYL CITRATE 0.05 MG/ML IJ SOLN
INTRAMUSCULAR | Status: AC
Start: 2014-07-12 — End: 2014-07-12
  Administered 2014-07-12: 100 ug via INTRAVENOUS
  Filled 2014-07-12: qty 4

## 2014-07-12 MED ORDER — MIDAZOLAM HCL 2 MG/2ML IJ SOLN
INTRAMUSCULAR | Status: AC
  Administered 2014-07-12: 2 mg via INTRAVENOUS
  Filled 2014-07-12: qty 4

## 2014-07-12 MED ORDER — ASPIRIN 300 MG RE SUPP: 300.0000 mg | RECTAL | Status: AC

## 2014-07-12 MED ORDER — ETOMIDATE 2 MG/ML IV SOLN
10.0000 mg | Freq: Once | INTRAVENOUS | Status: AC
  Administered 2014-07-12: 10 mg via INTRAVENOUS

## 2014-07-12 MED ORDER — CHLORHEXIDINE GLUCONATE 0.12 % MT SOLN
OROMUCOSAL | Status: AC
  Administered 2014-07-12: 15 mL
  Filled 2014-07-12: qty 15

## 2014-07-12 MED ORDER — MIDAZOLAM HCL 2 MG/2ML IJ SOLN
2.0000 mg | Freq: Once | INTRAMUSCULAR | Status: AC
  Administered 2014-07-12: 2 mg via INTRAVENOUS

## 2014-07-12 MED ORDER — INSULIN ASPART 100 UNIT/ML ~~LOC~~ SOLN
0.0000 [IU] | SUBCUTANEOUS | Status: DC
  Administered 2014-07-12: 2 [IU] via SUBCUTANEOUS
  Administered 2014-07-13 (×2): 1 [IU] via SUBCUTANEOUS
  Administered 2014-07-13 (×2): 2 [IU] via SUBCUTANEOUS

## 2014-07-12 MED ORDER — LIDOCAINE-EPINEPHRINE (PF) 1 %-1:200000 IJ SOLN
INTRAMUSCULAR | Status: AC
  Filled 2014-07-12: qty 10

## 2014-07-12 NOTE — ED Provider Notes (Addendum)
CRITICAL CARE Performed by: Hoy Morn Total critical care time: 35 Critical care time was exclusive of separately billable procedures and treating other patients. Critical care was necessary to treat or prevent imminent or life-threatening deterioration. Critical care was time spent personally by me on the following activities: development of treatment plan with patient and/or surrogate as well as nursing, discussions with consultants, evaluation of patient's response to treatment, examination of patient, obtaining history from patient or surrogate, ordering and performing treatments and interventions, ordering and review of laboratory studies, ordering and review of radiographic studies, pulse oximetry and re-evaluation of patient's condition.  3:35 AM The patient has bilateral obstructing ureteral stones likely as the cause of her urosepsis.  Patient is on phenylephrine at this time.  I discussed her case with Dr. Titus Mould who suggests interventional radiology for bilateral nephrostomy tubes.  The issue is the patient takes xarelto and she suspects that the last time she took it was 9 AM on Sunday, August 23.  She is in renal failure.  This increases her risk from the nephrostomy tubes.  I spoke with interventional radiology Dr. Annamaria Boots who would prefer to wait several more hours from a bleeding standpoint as he does not want to increase the patient's risk of iatrogenic complication from nephrostomy tube placement.  He understands the importance of early nephrostomy tubes however.  She will be planned for first thing in the morning.  Have updated Dr. Titus Mould and he is okay with this plan. Dr. Titus Mould, myself, and  Dr. Annamaria Boots believe the patient will best be managed with an endotracheal tube and intubated for her interventional radiology procedure given the fact she'll need to be prone for some time.  Patient and family are agreeable to this. She remains in shock at this time although it is somewhat  stabilized with IV fluids and high-dose pressors.  We'll continue to be aggressive in her care.  I have updated the family.  Results for orders placed during the hospital encounter of 07/11/14  CBC WITH DIFFERENTIAL      Result Value Ref Range   WBC 5.4  4.0 - 10.5 K/uL   RBC 4.18  3.87 - 5.11 MIL/uL   Hemoglobin 12.3  12.0 - 15.0 g/dL   HCT 38.0  36.0 - 46.0 %   MCV 90.9  78.0 - 100.0 fL   MCH 29.4  26.0 - 34.0 pg   MCHC 32.4  30.0 - 36.0 g/dL   RDW 14.0  11.5 - 15.5 %   Platelets 95 (*) 150 - 400 K/uL   Neutrophils Relative % 90 (*) 43 - 77 %   Lymphocytes Relative 8 (*) 12 - 46 %   Monocytes Relative 2 (*) 3 - 12 %   Eosinophils Relative 0  0 - 5 %   Basophils Relative 0  0 - 1 %   Neutro Abs 4.9  1.7 - 7.7 K/uL   Lymphs Abs 0.4 (*) 0.7 - 4.0 K/uL   Monocytes Absolute 0.1  0.1 - 1.0 K/uL   Eosinophils Absolute 0.0  0.0 - 0.7 K/uL   Basophils Absolute 0.0  0.0 - 0.1 K/uL   WBC Morphology INCREASED BANDS (>20% BANDS)    COMPREHENSIVE METABOLIC PANEL      Result Value Ref Range   Sodium 144  137 - 147 mEq/L   Potassium 3.2 (*) 3.7 - 5.3 mEq/L   Chloride 110  96 - 112 mEq/L   CO2 16 (*) 19 - 32 mEq/L   Glucose,  Bld 94  70 - 99 mg/dL   BUN 19  6 - 23 mg/dL   Creatinine, Ser 1.28 (*) 0.50 - 1.10 mg/dL   Calcium 8.5  8.4 - 10.5 mg/dL   Total Protein 4.8 (*) 6.0 - 8.3 g/dL   Albumin 2.2 (*) 3.5 - 5.2 g/dL   AST 48 (*) 0 - 37 U/L   ALT 12  0 - 35 U/L   Alkaline Phosphatase 71  39 - 117 U/L   Total Bilirubin 1.6 (*) 0.3 - 1.2 mg/dL   GFR calc non Af Amer 38 (*) >90 mL/min   GFR calc Af Amer 44 (*) >90 mL/min   Anion gap 18 (*) 5 - 15  URINALYSIS, ROUTINE W REFLEX MICROSCOPIC      Result Value Ref Range   Color, Urine RED (*) YELLOW   APPearance TURBID (*) CLEAR   Specific Gravity, Urine 1.014  1.005 - 1.030   pH 5.5  5.0 - 8.0   Glucose, UA NEGATIVE  NEGATIVE mg/dL   Hgb urine dipstick LARGE (*) NEGATIVE   Bilirubin Urine MODERATE (*) NEGATIVE   Ketones, ur 15 (*)  NEGATIVE mg/dL   Protein, ur 100 (*) NEGATIVE mg/dL   Urobilinogen, UA 1.0  0.0 - 1.0 mg/dL   Nitrite NEGATIVE  NEGATIVE   Leukocytes, UA MODERATE (*) NEGATIVE  URINE MICROSCOPIC-ADD ON      Result Value Ref Range   Squamous Epithelial / LPF RARE  RARE   WBC, UA 7-10  <3 WBC/hpf   RBC / HPF TOO NUMEROUS TO COUNT  <3 RBC/hpf   Bacteria, UA MANY (*) RARE  I-STAT CG4 LACTIC ACID, ED      Result Value Ref Range   Lactic Acid, Venous 7.35 (*) 0.5 - 2.2 mmol/L  I-STAT CG4 LACTIC ACID, ED      Result Value Ref Range   Lactic Acid, Venous 7.30 (*) 0.5 - 2.2 mmol/L  I-STAT ARTERIAL BLOOD GAS, ED      Result Value Ref Range   pH, Arterial 7.250 (*) 7.350 - 7.450   pCO2 arterial 27.7 (*) 35.0 - 45.0 mmHg   pO2, Arterial 86.0  80.0 - 100.0 mmHg   Bicarbonate 12.2 (*) 20.0 - 24.0 mEq/L   TCO2 13  0 - 100 mmol/L   O2 Saturation 95.0     Acid-base deficit 14.0 (*) 0.0 - 2.0 mmol/L   Collection site RADIAL, ALLEN'S TEST ACCEPTABLE     Drawn by RT     Sample type ARTERIAL     Dg Chest Port 1 View  07/11/2014   CLINICAL DATA:  Altered mental status.  EXAM: PORTABLE CHEST - 1 VIEW  COMPARISON:  03/31/2014  FINDINGS: Mild cardiac enlargement without vascular congestion. Visualization of the left lung base is limited because the patient's hand is superimposed. Infiltration or atelectasis in the left base is not excluded. No focal consolidation on the right. No pneumothorax. Calcification of the aorta. Postoperative changes in the right shoulder. Thoracic curvature convex towards the right with suggestion of sclerotic vertebrae in the mid/ upper thoracic region. Portion of fracture in the left proximal humerus is again demonstrated.  IMPRESSION: No definite evidence of active pulmonary disease although infiltration or atelectasis cannot be excluded from the left lung base. Appearance of sclerosis in a mid/upper thoracic vertebra. Consider degenerative or metastatic disease.   Electronically Signed   By:  Lucienne Capers M.D.   On: 07/11/2014 22:52   Ct Renal Stone Study  07/12/2014  CLINICAL DATA:  Abdominal pain, bilateral flank pain, UTI and fever.  EXAM: CT RENAL STONE PROTOCOL  TECHNIQUE: Multidetector CT imaging of the abdomen and pelvis was performed following the standard protocol without intravenous contrast  COMPARISON:  05/1913  FINDINGS: Small bilateral pleural effusions with basilar atelectasis, slightly greater on the left. Mild bronchiectasis. Nodular interstitial infiltrates in the lung bases. Small esophageal hiatal hernia. Coronary artery calcifications.  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 In the lower pole moiety on the right, there is a 7.6 mm ovoid stone with proximal pyelocaliectasis consistent with obstruction. In the upper pole moiety on the left, there is a 7.8 mm stone with proximal hydronephrosis consistent with obstruction. The lower pole moiety on the left in the upper pole moiety on the right are not obstructed. Additional punctate size stones are demonstrated in the lower pole of the right kidney. Bladder is decompressed but no bladder stones are appreciated. There is fairly extensive perirenal edema and stranding with small amount of ascites and edema demonstrated throughout the mesenteric and extending into the pelvis. Small amount of pelvic free fluid. These changes are likely inflammatory and suggest infected stones or pyelonephritis.  The gallbladder wall appears thickened and there is increased density in the gallbladder most likely representing sludge. Suggestion of new mobile area which may indicate gallbladder infection as well. Circumscribed low-attenuation changes throughout the kidneys appear similar to prior study and probably represent cysts. Spleen size is normal. No adrenal gland nodules. Calcification of the abdominal aorta. Normal caliber aorta and inferior vena cava. Stomach, small bowel, and colon are not pathologically  enlarged. There is a soft tissue mass with calcification in the left mid abdomen. This measures about 5.2 x 2.8 cm. This is similar in size and appearance to previous study. Calcified nodules are demonstrated in the subcutaneous fat of the anterior abdominal wall. No free air in the abdomen.  Pelvis: There are bilateral inguinal hernias containing fat. No bowel herniation or obstruction. Uterus appears to be surgically absent. No pelvic mass or lymphadenopathy is suggested. Appendix is not identified. Degenerative changes in the spine and shoulders. Mild lumbar scoliosis. No destructive bone lesions.  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 Obstructing stone in the lower pole moiety on the right and in the upper pole more raphe on the left. Diffuse inflammatory infiltration around the kidneys and throughout the upper abdomen and mesentery with small amount of ascites. Inflammatory changes also demonstrated in the gallbladder with evidence of mild pneumobilia and gallbladder sludge. Left mid abdominal mesenteric mass, stable since previous study.   Electronically Signed   By: Lucienne Capers M.D.   On: 07/12/2014 03:29   Hoy Morn, MD 07/12/14 [place], MD 07/12/14 [place], MD 07/12/14 [phone]

## 2014-07-12 NOTE — Progress Notes (Signed)
PULMONARY / CRITICAL CARE MEDICINE   Name: Penny [name] MRN: [id number] DOB: 03/05/1929    ADMISSION DATE:  07/11/2014 CONSULTATION DATE:  07/11/14  REFERRING MD :  Mingo Amber, EDP  CHIEF COMPLAINT:  [name]  INITIAL PRESENTATION:  80 yr old h/o stents for stones adm 8/23 with severe sepsis/septic shock, MODS, lactic acidosis and bilateral stones with L hydronephrosis. 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 Obstructing stone in the lower pole moiety on the right and in the upper pole more raphe on the left. Diffuse inflammatory infiltration around the kidneys and throughout the upper abdomen and mesentery with small amount of ascites. Inflammatory changes also demonstrated in the gallbladder with evidence of mild pneumobilia and gallbladder sludge. Left mid abdominal mesenteric mass, stable since previous study 8/24:  IR placement of L PCN with drainage of very purulent urine  LINES/TUBES: ETT 8/24 >>  R IJ CVL 8/24 >>  R radial art line 8/24 >>   MICRO: Urine 8/24 >>  Blood 8/24 >>  Resp 8/24 >>  Urine (from PCN tube) 8/24 >>   ABX: Vanc 8/24 >>  Ceftaz 8/24 >>   SUBJECTIVE:  RASS -1. + F/C  VITAL SIGNS: Temp:  [97.5 F (36.4 C)-104 F (40 C)] 97.5 F (36.4 C) (08/24 0858) Pulse Rate:  [71-149] 93 (08/24 1316) Resp:  [16-31] 18 (08/24 1316) BP: (63-120)/(41-75) 107/66 mmHg (08/24 1316) SpO2:  [86 %-100 %] 98 % (08/24 1316) Arterial Line BP: (77-112)/(53-76) 102/63 mmHg (08/24 1053) FiO2 (%):  [40 %-80 %] 40 % (08/24 1329) Weight:  [54.9 kg (121 lb 0.5 oz)-60.4 kg (133 lb 2.5 oz)] 60.4 kg (133 lb 2.5 oz) (08/24 0500) HEMODYNAMICS:   VENTILATOR SETTINGS: Vent Mode:  [-] PRVC FiO2 (%):  [40 %-80 %] 40 % Set Rate:  [20 bmp-26 bmp] 20 bmp Vt Set:  [460 mL-500 mL] 460 mL PEEP:  [5 cmH20] 5 cmH20 Plateau Pressure:  [16 cmH20-17 cmH20] 16 cmH20 INTAKE /  OUTPUT:  Intake/Output Summary (Last 24 hours) at 07/12/14 1336 Last data filed at 07/12/14 1055  Gross per 24 hour  Intake 1678.83 ml  Output     10 ml  Net 1668.83 ml    PHYSICAL EXAMINATION: General:  NAD Neuro:  No focal defcits HEENT:  WNL Cardiovascular:  IRIR, no M, rate cotnrolled Lungs:  CTA bilaterally. Abdomen:  Soft, NT, ND and +BS. Ext:  -edema and -tenderness.  LABS: I have reviewed all of today's lab results. Relevant abnormalities are discussed in the A/P section  CXR: Mild LLL Atx  ASSESSMENT / PLAN:  INFECTIOUS A:  Severe sepsis Upper tract UTI Bilateral ureteral stones L hydronephrosis P:   S/P L PCN by IR 8/24 micro and abx as above  CARDIOVASCULAR A: Septic shock Chronic AF, rate controlled P:  Holding xarelto Cont vasopressors to maintain MAP > 73mmHg Cont empiric stress dose steroids for now  PULMONARY A: VDRF due to severe sepsis, lactic acidosis P:   Cont full vent support - settings reviewed and/or adjusted Cont vent bundle Daily SBT if/when meets criteria  RENAL A:   AKI likely ATN due to sepsis Severe metabolic acidosis, lactate Hypokalemia P:   Monitor BMET intermittently Monitor I/Os Correct electrolytes as indicated HCO3 gtt started 8/24  GASTROINTESTINAL A:   No acute issues P:   SUP: IV PPI Begin TFs 8/24  HEMATOLOGIC A:   Thrombocytopenia, probable low grade  DIC Prolonged prothrombin time - likely due to DIC + previous Xarelto P:  Stat coags Hold xaralto with [name] FFP for reversal. SCD's post transfusion   ENDOCRINE A:   Suspect relative AI Stress/steroids induced hyperglycemia without prior DM P:   Cont hydrocort Cont SSI  NEUROLOGIC A:  Septic Encephalopathy P:   RASS goal: -1 PAD protocol  TODAY'S SUMMARY:    I have personally obtained a history, examined the patient, evaluated laboratory and imaging results, formulated the assessment and plan and placed orders.  CRITICAL CARE: The  patient is critically ill with multiple organ systems failure and requires high complexity decision making for assessment and support, frequent evaluation and titration of therapies, application of advanced monitoring technologies and extensive interpretation of multiple databases. Critical Care Time devoted to patient care services described in this note is 40 minutes.   Merton Border, MD ; [facility] [phone].  After 5:30 PM or weekends, call [phone] Pulmonary and [place] Pager: [phone]  07/12/2014, 1:36 PM

## 2014-07-12 NOTE — Procedures (Signed)
Central Venous Catheter Insertion Procedure Note Penny [name] [id number] [birth year]  Procedure: Insertion of Central Venous Catheter Indications: Assessment of intravascular volume and Drug and/or fluid administration  Procedure Details Consent: Risks of procedure as well as the alternatives and risks of each were explained to the (patient/caregiver).  Consent for procedure obtained. Time Out: Verified patient identification, verified procedure, site/side was marked, verified correct patient position, special equipment/implants available, medications/allergies/relevent history reviewed, required imaging and test results available.  Performed  Maximum sterile technique was used including antiseptics, cap, gloves, gown, hand hygiene, mask and sheet. Skin prep: Chlorhexidine; local anesthetic administered A antimicrobial bonded/coated triple lumen catheter was placed in the right internal jugular vein using the Seldinger technique.  Evaluation Blood flow good Complications: No apparent complications Patient did tolerate procedure well. Chest X-ray ordered to verify placement.  CXR: pending.  U/S used in placement.  YACOUB,WESAM 07/12/2014, 7:15 AM

## 2014-07-12 NOTE — ED Notes (Signed)
Dr. Venora Maples giving 35mcg bolus of Phenylephrine at this time.

## 2014-07-12 NOTE — Progress Notes (Signed)
eLink Physician-Brief Progress Note Patient Name: MAIREN WALLENSTEIN DOB: 10/10/1929 MRN: [id number]   Date of Service  07/12/2014  HPI/Events of Note  Low k   eICU Interventions  giv ek iv     Intervention Category Intermediate Interventions: Electrolyte abnormality - evaluation and management  Raylene Miyamoto. 07/12/2014, 5:51 AM

## 2014-07-12 NOTE — Progress Notes (Signed)
Utilization review completed. Audel Coakley, RN, BSN. 

## 2014-07-12 NOTE — ED Provider Notes (Signed)
Filed Vitals:   07/12/14 0215  BP: 91/68  Pulse:   Temp:   Resp: 22     2:20 AM 103/60 an arterial line. Right art line placed by RT.     Angiocath insertion Performed by: Hoy Morn Consent: Verbal and  consent obtained. Risks and benefits: risks, benefits and alternatives were discussed Time out: Immediately prior to procedure a "time out" was called to verify the correct patient, procedure, equipment, support staff and site/side marked as required. Preparation: Patient was prepped and draped in the usual sterile fashion. Vein Location: left external jugular vein Gauge: 20 Normal blood return and flush without difficulty Patient tolerance: Patient tolerated the procedure well with no immediate complications.     Hoy Morn, MD 07/12/14 [phone]

## 2014-07-12 NOTE — Progress Notes (Signed)
**Note De-identified  Obfuscation** Sputum collected, labeled and sent to lab. 

## 2014-07-12 NOTE — ED Notes (Signed)
EJ line started by Dr. Venora Maples at this time.

## 2014-07-12 NOTE — ED Notes (Addendum)
Starting Phenylephrine at this time. Verbal order from [place], MD to bolus 100  mcg. 100 mcg given by Starleen Blue, RN. Drip initiated.

## 2014-07-12 NOTE — Procedures (Signed)
Arterial Catheter Insertion Procedure Note Penny [name] [id number] October 19, 1929  Procedure: Insertion of Arterial Catheter  Indications: Blood pressure monitoring  Procedure Details Consent: Risks of procedure as well as the alternatives and risks of each were explained to the (patient/caregiver).  Consent for procedure obtained. Time Out: Verified patient identification, verified procedure, site/side was marked, verified correct patient position, special equipment/implants available, medications/allergies/relevent history reviewed, required imaging and test results available.  Performed  Maximum sterile technique was used including antiseptics, cap, gloves, gown, hand hygiene, mask and sheet. Skin prep: Chlorhexidine; local anesthetic administered 20 gauge catheter was inserted into right radial artery using the Seldinger technique.  Evaluation Blood flow good; BP tracing good. Complications: No apparent complications  Inserted in RR x1 attempt. No complications noted. [place] Jet[place] 07/12/2014

## 2014-07-12 NOTE — ED Provider Notes (Signed)
1:45 AM I was called into the room by nursing staff because the patient dropped her pressure 65/37.  Her mentation continues to be good at this time.  This appears to be urosepsis.  I ordered 2 L normal saline bolus wide open.  I gave her 100 mcg of phenylephrine bolus.  I had nursing staff call pharmacy and expedite her phenylephrine drip.  She is now on a phenylephrine drip.  Have added vancomycin to her antibiotic regimen.  I've instructed the respiratory therapist to place an arterial line and obtain a blood gas.  Patient has 2 IVs with fluid running in wide open at this time.  Have updated the patient's family.  Hoy Morn, MD 07/12/14 [phone]

## 2014-07-12 NOTE — Procedures (Signed)
Successful [place] and fluoroscopic guided placement of a left sided PCN with end coiled and locked in the renal pelvis. Small sample of aspirated foul smelling brown urine sent to lab for analysis. PCN connected to gravity bag. No immediate post procedural complications.

## 2014-07-12 NOTE — H&P (Signed)
PULMONARY / CRITICAL CARE MEDICINE   Name: Penny [name] MRN: [id number] DOB: [birth year]    ADMISSION DATE:  07/11/2014 CONSULTATION DATE:  07/11/14  REFERRING MD :  Mingo Amber, EDP  CHIEF COMPLAINT:  [name]  INITIAL PRESENTATION: 80 yr old h/o stents for stones presents change in MS, [name], [name], lactic acidosis  STUDIES:  8/24 CT abdo/pelvis>>>  SIGNIFICANT EVENTS: 8/24 admit septic shock, [name], lactic not clearing  HISTORY OF PRESENT ILLNESS:  80 yr old female h/o ureteral stents in past presented with short duration change in MS. Taken to ER at [facility],. Found with temp 104. Received 2 liters of fluids and lactic acid repeat unchanged from 7.20. Found with [name], crt elevation. To note she is on xaralto and crt doubled from baseline.  Neo added for BP support. Improved alertness but MAP drop in ED further.  PAST MEDICAL HISTORY :  Past Medical History  Diagnosis Date  . Long-term (current) use of anticoagulants   . Chronic shoulder pain   . Heart palpitations     rare  . Memory problem     "a little short term and long term memory loss" (03/31/2014)  . Depression   . Hyperlipidemia   . Dyslipidemia   . Seasonal allergies   . Anxiety   . Hypertension   . CHF (congestive heart failure)   . Atrial fibrillation   . Humerus fracture 03/31/2014    "fell and broke left arm"  . Kidney stones   . Anginal pain   . Blood in urine     "chronic" (03/31/2014)  . Arthritis     "joints" (03/31/2014)  . Osteopenia     "severe" (03/31/2014)  . Syncope and collapse 03/31/2014  . Alzheimer's dementia 03/31/2014   Past Surgical History  Procedure Laterality Date  . Ureteral stent placement Bilateral     for stenosis; "they've been removed"  . Removal of growth  2007    growth under kidney removed and possibly kindey stones removed  . Knee arthroscopy Right 2011  . Total shoulder arthroplasty Right 10/2007  . Nephrolithotomy Right 06/04/2013    Procedure: NEPHROLITHOTOMY PERCUTANEOUS;   Surgeon: Franchot Gallo, MD;  Location: [facility];  Service: Urology;  Laterality: Right;  . Appendectomy  ~ 1953  . Bladder suspension      w/hysterectomy  . Cardioversion  2006  . Meniscus repair Left 07/2000  . Abdominal hysterectomy      partial   Prior to Admission medications   Medication Sig Start Date End Date Taking? Authorizing Provider  Ascorbic Acid (VITAMIN C PO) Take by mouth.   Yes Historical Provider, MD  Calcium Citrate (CALCITRATE PO) Take 1 tablet by mouth daily.   Yes Historical Provider, MD  diazepam (VALIUM) 5 MG tablet Take 2.5-5 mg by mouth every 12 (twelve) hours as needed for anxiety.   Yes Historical Provider, MD  digoxin (LANOXIN) 0.125 MG tablet Take 0.125 mg by mouth 3 (three) times a week. 1 pill on Monday, Wednesday, and Friday 01/14/13  Yes Marton Redwood, MD  donepezil (ARICEPT) 10 MG tablet Take 10 mg by mouth at bedtime.    Yes Historical Provider, MD  HYDROcodone-acetaminophen (NORCO/VICODIN) 5-325 MG per tablet Take 1-2 tablets by mouth every 4 (four) hours as needed for moderate pain. 04/02/14  Yes Marton Redwood, MD  lactose free nutrition (BOOST) LIQD Take 237 mLs by mouth daily as needed (for nutrition).   Yes Historical Provider, MD  metoprolol tartrate (LOPRESSOR) 25 MG tablet Take 12.5  mg by mouth every morning.   Yes Historical Provider, MD  Multiple Vitamin (MULTIVITAMIN WITH MINERALS) TABS Take 1 tablet by mouth daily.   Yes Historical Provider, MD  Omega-3 Fatty Acids (FISH OIL PO) Take 1 capsule by mouth daily.   Yes Historical Provider, MD  ondansetron (ZOFRAN) 4 MG tablet Take 1 tablet (4 mg total) by mouth every 8 (eight) hours as needed for nausea or vomiting. 03/17/14  Yes Gregor Hams, MD  Rivaroxaban (XARELTO) 20 MG TABS tablet Take 1 tablet (20 mg total) by mouth daily. 06/24/13  Yes Darlin Coco, MD   Allergies  Allergen Reactions  . Sulfa Antibiotics Rash  . Zocor [Simvastatin] Other (See Comments)    FAMILY HISTORY:  Family History   Problem Relation Age of Onset  . Heart disease Mother    SOCIAL HISTORY:  reports that she quit smoking about 33 years ago. Her smoking use included Cigarettes. She has a 1.25 pack-year smoking history. She has never used smokeless tobacco. She reports that she does not drink alcohol or use illicit drugs.  REVIEW OF SYSTEMS: unable, dimnetia  SUBJECTIVE:   VITAL SIGNS: Temp:  [104 F (40 C)] 104 F (40 C) (08/23 2206) Pulse Rate:  [71-105] 71 (08/24 0100) Resp:  [16-27] 17 (08/24 0100) BP: (67-92)/(41-54) 67/45 mmHg (08/24 0100) SpO2:  [95 %-99 %] 95 % (08/24 0100) Weight:  [54.9 kg (121 lb 0.5 oz)] 54.9 kg (121 lb 0.5 oz) (08/24 0045) HEMODYNAMICS:   VENTILATOR SETTINGS:   INTAKE / OUTPUT: No intake or output data in the 24 hours ending 07/12/14 0128  PHYSICAL EXAMINATION: General:  Chronically ill appearing elderly female. Neuro:  Awake and interactive and following commands, moving all ext to command. HEENT:  [place]/AT, PERRL, EOM-I, DMM. Cardiovascular:  IRIR, Nl S1/S2, -M/R/G. Lungs:  CTA bilaterall. Abdomen:  Soft, NT, ND and +BS. Musculoskeletal:  -edema and -tenderness. Skin:  Intact.  LABS:  CBC  Recent Labs Lab 07/11/14 2237  WBC 5.4  HGB 12.3  HCT 38.0  PLT 95*   Coag's No results found for this basename: APTT, INR,  in the last 168 hours BMET  Recent Labs Lab 07/11/14 2237  NA 144  K 3.2*  CL 110  CO2 16*  BUN 19  CREATININE 1.28*  GLUCOSE 94   Electrolytes  Recent Labs Lab 07/11/14 2237  CALCIUM 8.5   Sepsis Markers  Recent Labs Lab 07/11/14 2300 07/12/14 0055  LATICACIDVEN 7.35* 7.30*   ABG No results found for this basename: PHART, PCO2ART, PO2ART,  in the last 168 hours Liver Enzymes  Recent Labs Lab 07/11/14 2237  AST 48*  ALT 12  ALKPHOS 71  BILITOT 1.6*  ALBUMIN 2.2*   Cardiac Enzymes No results found for this basename: TROPONINI, PROBNP,  in the last 168 hours Glucose No results found for this basename:  GLUCAP,  in the last 168 hours  Imaging Dg Chest Port 1 View  07/11/2014   CLINICAL DATA:  Altered mental status.  EXAM: PORTABLE CHEST - 1 VIEW  COMPARISON:  03/31/2014  FINDINGS: Mild cardiac enlargement without vascular congestion. Visualization of the left lung base is limited because the patient's hand is superimposed. Infiltration or atelectasis in the left base is not excluded. No focal consolidation on the right. No pneumothorax. Calcification of the aorta. Postoperative changes in the right shoulder. Thoracic curvature convex towards the right with suggestion of sclerotic vertebrae in the mid/ upper thoracic region. Portion of fracture in the left proximal  humerus is again demonstrated.  IMPRESSION: No definite evidence of active pulmonary disease although infiltration or atelectasis cannot be excluded from the left lung base. Appearance of sclerosis in a mid/upper thoracic vertebra. Consider degenerative or metastatic disease.   Electronically Signed   By: Lucienne Capers M.D.   On: 07/11/2014 22:52   ASSESSMENT / PLAN:  PULMONARY A:r/o uncompensated met acidosis P:   ABG noted, compensated metabolic acidosis, will change IVF to bicarb drip. Follow am pcxr for edema after volume given. IS as able. Will intubate for procedure and insert TLC F/U CXR and ABG.  CARDIOVASCULAR CVL 8/24>>> A: Septic shock, h/o fib P:  Hold xaralto with [name] INR is 3.57, has an EJ in for now that will be used for pressors for now (low dose neo) but will need TLC once INR is reversed with FFP. Continued volume resuscitation IR to place nephrostomy tubes. Obtain cortisol, then administer stresss empiric roids tsh Tele Start neo through PIV until coagulapathy investigated If/when Central access obtained transition to levophed STAT dig level  RENAL A:  [name], r/o obstruction, r/o ATN P:   STAT CT resuscitation with volume  Chem q8h  GASTROINTESTINAL A:  sepsis P:   ppi  required NPO  HEMATOLOGIC A:  Home xaralto P:  Stat coags Hold xaralto with [name] FFP for reversal. SCD's post transfusion  INFECTIOUS A:  Septic shock, UTI, r/o hydro P:   BCx2 8/23>>> UC 8/23>>> ceftaz 8/24>>> vanc 8/24>>> Hydro = perc drain  ENDOCRINE A:  R/o rel AI  P:   Stat cortisol the empiric stress roids tsh ssi  NEUROLOGIC A:  Septic Encephalopathy P:   RASS goal: 0 Home benzo use prn, may need to dc or reduce Treat sepsis  TODAY'S SUMMARY: Will intubate for IR, place TLC and sepsis protocol, hold off bicarb for now.  I have personally obtained a history, examined the patient, evaluated laboratory and imaging results, formulated the assessment and plan and placed orders.  CRITICAL CARE: The patient is critically ill with multiple organ systems failure and requires high complexity decision making for assessment and support, frequent evaluation and titration of therapies, application of advanced monitoring technologies and extensive interpretation of multiple databases. Critical Care Time devoted to patient care services described in this note is 45 minutes.    Pulmonary and [place] Pager: [phone]  07/12/2014, 1:28 AM

## 2014-07-12 NOTE — Sedation Documentation (Signed)
L neph tube placed per Dr Pascal Lux

## 2014-07-12 NOTE — Consult Note (Signed)
Reason for Consult:Sepsis, bilateral hydronephrosis Consulting Radiologist: Pascal Lux Referring Physician: Nelda Marseille   HPI: Penny [name] is an 80 y.o. female who has been admitted with [name]. She is found to have bilateral renal stones with hydronephrosis. She has been intubated and IR is asked to do (B)perc nephrostomy tubes ASAP. Chart, PMHx, imaging, labs reviewed. Was taking Xarelto, last dose per family was Saturday, 8/22 Family at bedside  Past Medical History:  Past Medical History  Diagnosis Date  . Long-term (current) use of anticoagulants   . Chronic shoulder pain   . Heart palpitations     rare  . Memory problem     "a little short term and long term memory loss" (03/31/2014)  . Depression   . Hyperlipidemia   . Dyslipidemia   . Seasonal allergies   . Anxiety   . Hypertension   . CHF (congestive heart failure)   . Atrial fibrillation   . Humerus fracture 03/31/2014    "fell and broke left arm"  . Kidney stones   . Anginal pain   . Blood in urine     "chronic" (03/31/2014)  . Arthritis     "joints" (03/31/2014)  . Osteopenia     "severe" (03/31/2014)  . Syncope and collapse 03/31/2014  . Alzheimer's dementia 03/31/2014    Surgical History:  Past Surgical History  Procedure Laterality Date  . Ureteral stent placement Bilateral     for stenosis; "they've been removed"  . Removal of growth  2007    growth under kidney removed and possibly kindey stones removed  . Knee arthroscopy Right 2011  . Total shoulder arthroplasty Right 10/2007  . Nephrolithotomy Right 06/04/2013    Procedure: NEPHROLITHOTOMY PERCUTANEOUS;  Surgeon: Franchot Gallo, MD;  Location: [facility];  Service: Urology;  Laterality: Right;  . Appendectomy  ~ 1953  . Bladder suspension      w/hysterectomy  . Cardioversion  2006  . Meniscus repair Left 07/2000  . Abdominal hysterectomy      partial    Family History:  Family History  Problem Relation Age of Onset  . Heart disease Mother      Social History:  reports that she quit smoking about 33 years ago. Her smoking use included Cigarettes. She has a 1.25 pack-year smoking history. She has never used smokeless tobacco. She reports that she does not drink alcohol or use illicit drugs.  Allergies:  Allergies  Allergen Reactions  . Sulfa Antibiotics Rash  . Zocor [Simvastatin] Other (See Comments)    Medications: Current facility-administered medications:0.9 %  sodium chloride infusion, 1,000 mL, Intravenous, Continuous, Evelina Bucy, MD, Last Rate: 125 mL/hr at 07/11/14 2244, 1,000 mL at 07/11/14 2244;  0.9 %  sodium chloride infusion, 250 mL, Intravenous, PRN, Raylene Miyamoto, MD;  0.9 %  sodium chloride infusion, , Intravenous, Continuous, Raylene Miyamoto, MD;  0.9 %  sodium chloride infusion, , Intravenous, Once, Rush Farmer, MD acetaminophen (TYLENOL) tablet 650 mg, 650 mg, Oral, Q6H PRN, Evelina Bucy, MD, 650 mg at 07/11/14 2234;  cefTAZidime (FORTAZ) 1 g in dextrose 5 % 50 mL IVPB, 1 g, Intravenous, Q24H, Rogue Bussing, RPH, 1 g at 07/12/14 0754;  fentaNYL (SUBLIMAZE) 2,500 mcg in sodium chloride 0.9 % 250 mL (10 mcg/mL) infusion, 25-400 mcg/hr, Intravenous, Continuous, Rush Farmer, MD hydrocortisone sodium succinate (SOLU-CORTEF) 100 MG injection 50 mg, 50 mg, Intravenous, Q6H, Raylene Miyamoto, MD, 50 mg at 07/12/14 0518;  insulin aspart (novoLOG) injection 0-9 Units, 0-9  Units, Subcutaneous, 6 times per day, Raylene Miyamoto, MD;  midazolam (VERSED) injection 1-2 mg, 1-2 mg, Intravenous, Q2H PRN, Rush Farmer, MD norepinephrine (LEVOPHED) 4 mg in dextrose 5 % 250 mL infusion, 2-50 mcg/min, Intravenous, Continuous, Rush Farmer, MD;  pantoprazole (PROTONIX) injection 40 mg, 40 mg, Intravenous, QHS, Raylene Miyamoto, MD, 40 mg at 07/12/14 0528;  phenylephrine (NEO-SYNEPHRINE) 10 mg in dextrose 5 % 250 mL (0.04 mg/mL) infusion, 20-200 mcg/min, Intravenous, Titrated, Raylene Miyamoto, MD, Last Rate:  225 mL/hr at 07/12/14 0719, 150 mcg/min at 07/12/14 0719 potassium chloride 10 mEq in 100 mL IVPB, 10 mEq, Intravenous, Q1 Hr x 4, Raylene Miyamoto, MD, 10 mEq at 07/12/14 0865;  sodium chloride 0.9 % bolus 1,000 mL, 1,000 mL, Intravenous, PRN, Raylene Miyamoto, MD, 1,000 mL at 07/12/14 0200;  sodium chloride 0.9 % bolus 1,000 mL, 1,000 mL, Intravenous, PRN, Rush Farmer, MD;  sodium chloride 0.9 % bolus 500 mL, 500 mL, Intravenous, Once, Raylene Miyamoto, MD Derrill Memo ON 07/13/2014] vancomycin (VANCOCIN) 500 mg in sodium chloride 0.9 % 100 mL IVPB, 500 mg, Intravenous, Q24H, Veronda Francee Nodal, RPH   ROS: See HPI for pertinent findings, otherwise complete 10 system review negative.  Physical Exam: Blood pressure 119/75, pulse 94, temperature 97.9 F (36.6 C), temperature source Oral, resp. rate 19, height 5' 4.17" (1.63 m), weight 133 lb 2.5 oz (60.4 kg), SpO2 93.00%. Intubated, sedated. ENT: ET tube intact Lungs: CTA Heart: Regular Abd: soft, NT    Labs: CBC  Recent Labs  07/11/14 2237 07/12/14 0347  WBC 5.4 15.1*  HGB 12.3 12.3  HCT 38.0 37.8  PLT 95* 88*   MET  Recent Labs  07/11/14 2237 07/12/14 0347  NA 144 143  K 3.2* 3.0*  CL 110 117*  CO2 16* 13*  GLUCOSE 94 121*  BUN 19 19  CREATININE 1.28* 1.12*  CALCIUM 8.5 6.6*    Recent Labs  07/11/14 2237  PROT 4.8*  ALBUMIN 2.2*  AST 48*  ALT 12  ALKPHOS 71  BILITOT 1.6*   PT/INR  Recent Labs  07/12/14 0347  LABPROT 35.7*  INR 3.57*   ABG  Recent Labs  07/12/14 0717 07/12/14 0835  PHART 7.274* 7.248*  HCO3 15.5* 11.9*      Dg Chest Portable 1 View  07/12/2014   CLINICAL DATA:  Sepsis  EXAM: PORTABLE CHEST - 1 VIEW  COMPARISON:  Portable chest x-ray of July 11, 2014  FINDINGS: The lungs are adequately inflated. The retrocardiac region on the left remains dense but has improved slightly. There remains partial obscuration of the left hemidiaphragm. The cardiac silhouette is top-normal  in size. The pulmonary vascularity is not engorged.  The endotracheal tube tip lies 3.2 cm above the crotch of the carina. The esophagogastric tube tip projects off the inferior margin of the image. The right internal jugular venous catheter tip lies in the proximal SVC.  IMPRESSION: 1. Slightly improved appearance of the left lower lobe since yesterday's study posterior related to intubation. Atelectasis and/or pneumonia here persists. 2. The support tubes and lines are in appropriate position radiographically. There is no postprocedure complication following placement of the right internal jugular venous catheter.   Electronically Signed   By: David  Martinique   On: 07/12/2014 07:44   Dg Chest Port 1 View  07/11/2014   CLINICAL DATA:  Altered mental status.  EXAM: PORTABLE CHEST - 1 VIEW  COMPARISON:  03/31/2014  FINDINGS: Mild cardiac enlargement  without vascular congestion. Visualization of the left lung base is limited because the patient's hand is superimposed. Infiltration or atelectasis in the left base is not excluded. No focal consolidation on the right. No pneumothorax. Calcification of the aorta. Postoperative changes in the right shoulder. Thoracic curvature convex towards the right with suggestion of sclerotic vertebrae in the mid/ upper thoracic region. Portion of fracture in the left proximal humerus is again demonstrated.  IMPRESSION: No definite evidence of active pulmonary disease although infiltration or atelectasis cannot be excluded from the left lung base. Appearance of sclerosis in a mid/upper thoracic vertebra. Consider degenerative or metastatic disease.   Electronically Signed   By: Lucienne Capers M.D.   On: 07/11/2014 22:52   Ct Renal Stone Study  07/12/2014   CLINICAL DATA:  Abdominal pain, bilateral flank pain, UTI and fever.  EXAM: CT RENAL STONE PROTOCOL  TECHNIQUE: Multidetector CT imaging of the abdomen and pelvis was performed following the standard protocol without  intravenous contrast  COMPARISON:  05/1913  FINDINGS: Small bilateral pleural effusions with basilar atelectasis, slightly greater on the left. Mild bronchiectasis. Nodular interstitial infiltrates in the lung bases. Small esophageal hiatal hernia. Coronary artery calcifications.  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 In the lower pole moiety on the right, there is a 7.6 mm ovoid stone with proximal pyelocaliectasis consistent with obstruction. In the upper pole moiety on the left, there is a 7.8 mm stone with proximal hydronephrosis consistent with obstruction. The lower pole moiety on the left in the upper pole moiety on the right are not obstructed. Additional punctate size stones are demonstrated in the lower pole of the right kidney. Bladder is decompressed but no bladder stones are appreciated. There is fairly extensive perirenal edema and stranding with small amount of ascites and edema demonstrated throughout the mesenteric and extending into the pelvis. Small amount of pelvic free fluid. These changes are likely inflammatory and suggest infected stones or pyelonephritis.  The gallbladder wall appears thickened and there is increased density in the gallbladder most likely representing sludge. Suggestion of new mobile area which may indicate gallbladder infection as well. Circumscribed low-attenuation changes throughout the kidneys appear similar to prior study and probably represent cysts. Spleen size is normal. No adrenal gland nodules. Calcification of the abdominal aorta. Normal caliber aorta and inferior vena cava. Stomach, small bowel, and colon are not pathologically enlarged. There is a soft tissue mass with calcification in the left mid abdomen. This measures about 5.2 x 2.8 cm. This is similar in size and appearance to previous study. Calcified nodules are demonstrated in the subcutaneous fat of the anterior abdominal wall. No free air in the abdomen.  Pelvis:  There are bilateral inguinal hernias containing fat. No bowel herniation or obstruction. Uterus appears to be surgically absent. No pelvic mass or lymphadenopathy is suggested. Appendix is not identified. Degenerative changes in the spine and shoulders. Mild lumbar scoliosis. No destructive bone lesions.  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 Obstructing stone in the lower pole moiety on the right and in the upper pole more raphe on the left. Diffuse inflammatory infiltration around the kidneys and throughout the upper abdomen and mesentery with small amount of ascites. Inflammatory changes also demonstrated in the gallbladder with evidence of mild pneumobilia and gallbladder sludge. Left mid abdominal mesenteric mass, stable since previous study.   Electronically Signed   By: Lucienne Capers M.D.   On: 07/12/2014 03:29    Assessment/Plan: Sepsis  with bilateral hydronephrosis For (B)PCN Discussed procedure, risks, complications. Will need Urology consult for planning of eventual stone treatment. INR spiked to 3.57 this am. Has 2 FFP units ordered to be given. Will move forward with procedure when risks minimized. Consent signed by husband, in chart  Ascencion Dike PA-C 07/12/2014, 8:47 AM

## 2014-07-12 NOTE — ED Notes (Signed)
Arterial line placed at this time

## 2014-07-12 NOTE — Progress Notes (Signed)
ANTIBIOTIC CONSULT NOTE - INITIAL  Pharmacy Consult for Vancocin and Tressie Ellis Indication: rule out sepsis and UTI  Allergies  Allergen Reactions  . Sulfa Antibiotics Rash  . Zocor [Simvastatin] Other (See Comments)    Patient Measurements: Height: 5' 4.17" (163 cm) Weight: 121 lb 0.5 oz (54.9 kg) IBW/kg (Calculated) : 55.1  Vital Signs: Temp: 104 F (40 C) (08/23 2206) Temp src: Rectal (08/23 2206) BP: 67/45 mmHg (08/24 0100) Pulse Rate: 71 (08/24 0100)  Labs:  Recent Labs  07/11/14 2237  WBC 5.4  HGB 12.3  PLT 95*  CREATININE 1.28*   Estimated Creatinine Clearance: 28.9 ml/min (by C-G formula based on Cr of 1.28).  Medical History: Past Medical History  Diagnosis Date  . Long-term (current) use of anticoagulants   . Chronic shoulder pain   . Heart palpitations     rare  . Memory problem     "a little short term and long term memory loss" (03/31/2014)  . Depression   . Hyperlipidemia   . Dyslipidemia   . Seasonal allergies   . Anxiety   . Hypertension   . CHF (congestive heart failure)   . Atrial fibrillation   . Humerus fracture 03/31/2014    "fell and broke left arm"  . Kidney stones   . Anginal pain   . Blood in urine     "chronic" (03/31/2014)  . Arthritis     "joints" (03/31/2014)  . Osteopenia     "severe" (03/31/2014)  . Syncope and collapse 03/31/2014  . Alzheimer's dementia 03/31/2014    Assessment: 80yo female presents w/ AMS w/ fever to 104, prone to UTIs, UA abnormal, Cx IP, also concerning for sepsis given hypotension, to begin IV ABX.  Goal of Therapy:  Vancomycin trough level 15-20 mcg/ml  Plan:  Rec'd Rocephin 1g in ED; will continue with vancomycin 1000mg  IV x1 followed by 500mg  IV Q24H as well as Fortaz 1g IV Q24H and monitor CBC, Cx, levels prn.  Wynona Neat, PharmD, BCPS  07/12/2014,1:28 AM

## 2014-07-12 NOTE — ED Provider Notes (Addendum)
Filed Vitals:   07/12/14 0115  BP: 63/43  Pulse:   Temp:   Resp: 18     1:47 AM BP 103/67  Hoy Morn, MD 07/12/14 [place], MD 07/12/14 [phone]

## 2014-07-12 NOTE — Procedures (Signed)
Intubation Procedure Note Penny [name] [id number] October 01, 1929  Procedure: Intubation Indications: Airway protection and maintenance  Procedure Details Consent: Risks of procedure as well as the alternatives and risks of each were explained to the (patient/caregiver).  Consent for procedure obtained. Time Out: Verified patient identification, verified procedure, site/side was marked, verified correct patient position, special equipment/implants available, medications/allergies/relevent history reviewed, required imaging and test results available.  Performed  Maximum sterile technique was used including gloves, hand hygiene and mask.  MAC    Evaluation Hemodynamic Status: BP stable throughout; O2 sats: stable throughout Patient's Current Condition: stable Complications: No apparent complications Patient did tolerate procedure well. Chest X-ray ordered to verify placement.  CXR: pending.   Penny [name] 07/12/2014

## 2014-07-12 NOTE — Progress Notes (Signed)
INITIAL NUTRITION ASSESSMENT  DOCUMENTATION CODES Per approved criteria  -Not Applicable   INTERVENTION: Utilize 63M PEPuP Protocol: initiate TF via OGT with Vital AF 1.2 at 25 ml/h and Prostat 30 ml BID on day 1; on day 2, increase to goal rate of 40 ml/h (960 ml per day) to provide 1352 kcals, 102 gm protein, 778 ml free water daily.  NUTRITION DIAGNOSIS: Inadequate oral intake related to inability to eat as evidenced by NPO.   Goal: Pt to meet >/= 90% of their estimated nutrition needs   Monitor:  Weight trend, vent status, initiation and tolerance of TF, labs  Reason for Assessment: Consult for TF initiation and management  80 y.o. female  Admitting Dx: <principal problem not specified>  ASSESSMENT: 80 yr old h/o stents for stones presents change in MS, [name], [name], lactic acidosis  Pt s/p: IR PERC NEPHROSTOMY LEFT (8/24)  Patient is currently intubated on ventilator support MV: 9.3 L/min Temp (24hrs), Avg:99.8 F (37.7 C), Min:97.5 F (36.4 C), Max:104 F (40 C)  Labs: CBGs: 81-198 Na and BUN WNL K Low  Height: Ht Readings from Last 1 Encounters:  07/12/14 5' 4.17" (1.63 m)    Weight: Wt Readings from Last 1 Encounters:  07/12/14 133 lb 2.5 oz (60.4 kg)    Ideal Body Weight: 55.1 kg  % Ideal Body Weight: 110%  Wt Readings from Last 10 Encounters:  07/12/14 133 lb 2.5 oz (60.4 kg)  05/19/14 121 lb (54.885 kg)  04/16/14 125 lb 6.4 oz (56.881 kg)  04/05/14 125 lb (56.7 kg)  03/31/14 165 lb 6.4 oz (75.025 kg)  10/26/13 121 lb 12.8 oz (55.248 kg)  06/24/13 118 lb 9.6 oz (53.797 kg)  06/04/13 120 lb (54.432 kg)  06/04/13 120 lb (54.432 kg)  06/04/13 120 lb (54.432 kg)    Usual Body Weight: unknown  % Usual Body Weight: n/a  BMI:  Body mass index is 22.73 kg/(m^2).  Estimated Nutritional Needs: Kcal: 1351 Protein: 85-100 g Fluid: Per MD  Skin: Intact  Diet Order:    EDUCATION NEEDS: -Education not appropriate at this  time   Intake/Output Summary (Last 24 hours) at 07/12/14 1334 Last data filed at 07/12/14 1055  Gross per 24 hour  Intake 1678.83 ml  Output     10 ml  Net 1668.83 ml    Last BM: prior to admission   Labs:   Recent Labs Lab 07/11/14 2237 07/12/14 0347 07/12/14 0848  NA 144 143 139  K 3.2* 3.0* 3.3*  CL 110 117* 112  CO2 16* 13* 11*  BUN 19 19 19   CREATININE 1.28* 1.12* 1.15*  CALCIUM 8.5 6.6* 6.5*  MG  --  1.3*  --   PHOS  --  2.7  --   GLUCOSE 94 121* 236*    CBG (last 3)   Recent Labs  07/12/14 0415 07/12/14 0832  GLUCAP 82 198*    Scheduled Meds: . sodium chloride   Intravenous Once  . cefTAZidime (FORTAZ)  IV  1 g Intravenous Q24H  . feeding supplement (PRO-STAT SUGAR FREE 64)  30 mL Per Tube BID  . feeding supplement (VITAL HIGH PROTEIN)  1,000 mL Per Tube Q24H  . fentaNYL      . hydrocortisone sodium succinate  50 mg Intravenous Q6H  . insulin aspart  0-9 Units Subcutaneous 6 times per day  . lidocaine      . lidocaine-EPINEPHrine      . midazolam      .  pantoprazole (PROTONIX) IV  40 mg Intravenous QHS  . phytonadione (VITAMIN K) IV  10 mg Intravenous Daily  . potassium chloride  10 mEq Intravenous Q1 Hr x 3  . sodium chloride  500 mL Intravenous Once  . [START ON 07/13/2014] vancomycin  500 mg Intravenous Q24H    Continuous Infusions: . fentaNYL infusion INTRAVENOUS    . norepinephrine (LEVOPHED) Adult infusion 15 mcg/min (07/12/14 0954)  .  sodium bicarbonate 150 mEq in sterile water 1000 mL infusion 100 mL/hr at 07/12/14 1106    Past Medical History  Diagnosis Date  . Long-term (current) use of anticoagulants   . Chronic shoulder pain   . Heart palpitations     rare  . Memory problem     "a little short term and long term memory loss" (03/31/2014)  . Depression   . Hyperlipidemia   . Dyslipidemia   . Seasonal allergies   . Anxiety   . Hypertension   . CHF (congestive heart failure)   . Atrial fibrillation   . Humerus fracture  03/31/2014    "fell and broke left arm"  . Kidney stones   . Anginal pain   . Blood in urine     "chronic" (03/31/2014)  . Arthritis     "joints" (03/31/2014)  . Osteopenia     "severe" (03/31/2014)  . Syncope and collapse 03/31/2014  . Alzheimer's dementia 03/31/2014    Past Surgical History  Procedure Laterality Date  . Ureteral stent placement Bilateral     for stenosis; "they've been removed"  . Removal of growth  2007    growth under kidney removed and possibly kindey stones removed  . Knee arthroscopy Right 2011  . Total shoulder arthroplasty Right 10/2007  . Nephrolithotomy Right 06/04/2013    Procedure: NEPHROLITHOTOMY PERCUTANEOUS;  Surgeon: Franchot Gallo, MD;  Location: [facility];  Service: Urology;  Laterality: Right;  . Appendectomy  ~ 1953  . Bladder suspension      w/hysterectomy  . Cardioversion  2006  . Meniscus repair Left 07/2000  . Abdominal hysterectomy      partial    Terrace Arabia RD, LDN

## 2014-07-12 NOTE — ED Notes (Signed)
Attempting arterial line at this time.

## 2014-07-12 NOTE — ED Notes (Signed)
Two boluses started at this time.

## 2014-07-12 NOTE — Progress Notes (Signed)
eLink Physician-Brief Progress Note Patient Name: Penny [name] DOB: 07-01-1929 MRN: [id number]   Date of Service  07/12/2014  HPI/Events of Note  Septic shock, uti, r/o hydro All orders placed, Dr Nelda Marseille to see  eICU Interventions  Neo added, some reluctantly to place line with xaralto and arf, give volume, get CT stat Support MAP, repeat lactic further, assess cortisol     Intervention Category Major Interventions: Hypotension - evaluation and management  Raylene Miyamoto. 07/12/2014, 2:28 AM

## 2014-07-12 NOTE — Consult Note (Signed)
Pt's elevated INR d/w PCCM MD, Dr. Alva Garnet, who feels the pt's coagulopathy will not be improved until we have the infection source controlled with bilateral PCNs.  Pt has received 2 units of FFP as well as Vit K.  Her repeat PLT level is improved.  Given above clinical situation, I feel the potential benefits of bilateral PCN placement and infection source control outweighs potential increased risk of bleeding.  Above d/w pt's husband who understands and agrees to proceed with the procedure.

## 2014-07-13 ENCOUNTER — Inpatient Hospital Stay (HOSPITAL_COMMUNITY): Payer: Medicare Other

## 2014-07-13 DIAGNOSIS — N289 Disorder of kidney and ureter, unspecified: Secondary | ICD-10-CM

## 2014-07-13 LAB — COMPREHENSIVE METABOLIC PANEL
ALT: 18 U/L (ref 0–35)
AST: 44 U/L — AB (ref 0–37)
Albumin: 2.3 g/dL — ABNORMAL LOW (ref 3.5–5.2)
Alkaline Phosphatase: 59 U/L (ref 39–117)
Anion gap: 16 — ABNORMAL HIGH (ref 5–15)
BUN: 24 mg/dL — ABNORMAL HIGH (ref 6–23)
CALCIUM: 7.5 mg/dL — AB (ref 8.4–10.5)
CO2: 22 meq/L (ref 19–32)
Chloride: 103 mEq/L (ref 96–112)
Creatinine, Ser: 1.03 mg/dL (ref 0.50–1.10)
GFR, EST AFRICAN AMERICAN: 57 mL/min — AB (ref >90)
GFR, EST NON AFRICAN AMERICAN: 49 mL/min — AB (ref >90)
GLUCOSE: 127 mg/dL — AB (ref 70–99)
Potassium: 3.7 mEq/L (ref 3.7–5.3)
Sodium: 141 mEq/L (ref 137–147)
TOTAL PROTEIN: 5.3 g/dL — AB (ref 6.0–8.3)
Total Bilirubin: 1.4 mg/dL — ABNORMAL HIGH (ref 0.3–1.2)

## 2014-07-13 LAB — URINE CULTURE
Colony Count: NO GROWTH
Culture: NO GROWTH

## 2014-07-13 LAB — GLUCOSE, CAPILLARY
GLUCOSE-CAPILLARY: 113 mg/dL — AB (ref 70–99)
GLUCOSE-CAPILLARY: 154 mg/dL — AB (ref 70–99)
GLUCOSE-CAPILLARY: 91 mg/dL (ref 70–99)
Glucose-Capillary: 128 mg/dL — ABNORMAL HIGH (ref 70–99)
Glucose-Capillary: 123 mg/dL — ABNORMAL HIGH (ref 70–99)
Glucose-Capillary: 160 mg/dL — ABNORMAL HIGH (ref 70–99)

## 2014-07-13 LAB — DIC (DISSEMINATED INTRAVASCULAR COAGULATION)PANEL
D-Dimer, Quant: 12.31 ug/mL-FEU — ABNORMAL HIGH (ref 0.00–0.48)
Fibrinogen: 533 mg/dL — ABNORMAL HIGH (ref 204–475)
Platelets: 92 10*3/uL — ABNORMAL LOW (ref 150–400)
Smear Review: NONE SEEN

## 2014-07-13 LAB — MAGNESIUM: Magnesium: 1.4 mg/dL — ABNORMAL LOW (ref 1.5–2.5)

## 2014-07-13 LAB — DIC (DISSEMINATED INTRAVASCULAR COAGULATION) PANEL
INR: 2.3 — AB (ref 0.00–1.49)
Prothrombin Time: 25.3 seconds — ABNORMAL HIGH (ref 11.6–15.2)
aPTT: 44 seconds — ABNORMAL HIGH (ref 24–37)

## 2014-07-13 LAB — PREPARE FRESH FROZEN PLASMA
UNIT DIVISION: 0
Unit division: 0

## 2014-07-13 LAB — CBC
HCT: 35.5 % — ABNORMAL LOW (ref 36.0–46.0)
Hemoglobin: 12 g/dL (ref 12.0–15.0)
MCH: 29.3 pg (ref 26.0–34.0)
MCHC: 33.8 g/dL (ref 30.0–36.0)
MCV: 86.8 fL (ref 78.0–100.0)
PLATELETS: 90 10*3/uL — AB (ref 150–400)
RBC: 4.09 MIL/uL (ref 3.87–5.11)
RDW: 14.4 % (ref 11.5–15.5)
WBC: 22.1 10*3/uL — ABNORMAL HIGH (ref 4.0–10.5)

## 2014-07-13 LAB — PHOSPHORUS: Phosphorus: 2.2 mg/dL — ABNORMAL LOW (ref 2.3–4.6)

## 2014-07-13 MED ORDER — CETYLPYRIDINIUM CHLORIDE 0.05 % MT LIQD
7.0000 mL | Freq: Four times a day (QID) | OROMUCOSAL | Status: DC
  Administered 2014-07-13 – 2014-07-14 (×4): 7 mL via OROMUCOSAL

## 2014-07-13 MED ORDER — CHLORHEXIDINE GLUCONATE 0.12 % MT SOLN
15.0000 mL | Freq: Two times a day (BID) | OROMUCOSAL | Status: DC
  Administered 2014-07-13 – 2014-07-21 (×17): 15 mL via OROMUCOSAL
  Filled 2014-07-13 (×20): qty 15

## 2014-07-13 MED ORDER — FENTANYL CITRATE 0.05 MG/ML IJ SOLN
12.5000 ug | INTRAMUSCULAR | Status: DC | PRN
Start: 2014-07-13 — End: 2014-07-16
  Administered 2014-07-13 – 2014-07-14 (×2): 25 ug via INTRAVENOUS
  Filled 2014-07-13 (×2): qty 2

## 2014-07-13 MED ORDER — HYDROCORTISONE NA SUCCINATE PF 100 MG IJ SOLR
50.0000 mg | Freq: Four times a day (QID) | INTRAMUSCULAR | Status: DC
  Filled 2014-07-13 (×4): qty 1

## 2014-07-13 MED ORDER — HYDROCORTISONE NA SUCCINATE PF 100 MG IJ SOLR
50.0000 mg | Freq: Two times a day (BID) | INTRAMUSCULAR | Status: DC
  Administered 2014-07-13 – 2014-07-14 (×3): 50 mg via INTRAVENOUS
  Filled 2014-07-13 (×4): qty 1

## 2014-07-13 MED ORDER — CIPROFLOXACIN IN D5W 400 MG/200ML IV SOLN
400.0000 mg | Freq: Two times a day (BID) | INTRAVENOUS | Status: DC
  Administered 2014-07-13 – 2014-07-14 (×2): 400 mg via INTRAVENOUS
  Filled 2014-07-13 (×3): qty 200

## 2014-07-13 MED ORDER — KCL IN DEXTROSE-NACL 20-5-0.45 MEQ/L-%-% IV SOLN
INTRAVENOUS | Status: DC
  Administered 2014-07-13 – 2014-07-14 (×2): via INTRAVENOUS
  Filled 2014-07-13 (×4): qty 1000

## 2014-07-13 MED ORDER — VANCOMYCIN HCL IN DEXTROSE 1-5 GM/200ML-% IV SOLN: 1000.0000 mg | INTRAVENOUS | Status: DC

## 2014-07-13 MED ORDER — DEXTROSE 5 % IV SOLN
1.0000 g | Freq: Two times a day (BID) | INTRAVENOUS | Status: DC
  Administered 2014-07-13 – 2014-07-14 (×2): 1 g via INTRAVENOUS
  Filled 2014-07-13 (×3): qty 1

## 2014-07-13 NOTE — Procedures (Signed)
**Note De-Identified Jonovan Boedecker Obfuscation** Extubation Procedure Note  Patient Details:   Name: JANKI DIKE DOB: 1929/11/14 MRN: [id number]   Airway Documentation:     Evaluation  O2 sats: stable throughout Complications: No apparent complications Patient did tolerate procedure well. Bilateral Breath Sounds: Diminished Suctioning: Airway Yes No stridor noted, + leak  Mister Krahenbuhl, Penni Bombard 07/13/2014, 12:20 PM

## 2014-07-13 NOTE — Progress Notes (Addendum)
ANTIBIOTIC CONSULT NOTE - INITIAL  Pharmacy Consult for Vancocin and Tressie Ellis Indication: rule out sepsis and UTI  Allergies  Allergen Reactions  . Sulfa Antibiotics Rash  . Zocor [Simvastatin] Other (See Comments)    Patient Measurements: Height: 5' 4.17" (163 cm) Weight: 145 lb 15.1 oz (66.2 kg) IBW/kg (Calculated) : 55.1  Vital Signs: Temp: 98 F (36.7 C) (08/25 0903) Temp src: Oral (08/25 0903) BP: 102/69 mmHg (08/25 0900) Pulse Rate: 88 (08/25 0930)  Labs:  Recent Labs  07/12/14 0347 07/12/14 0848 07/13/14 0500  WBC 15.1* 21.9* 22.1*  HGB 12.3 13.2 12.0  PLT 88* 110* 90*  92*  CREATININE 1.12* 1.15* 1.03   Estimated Creatinine Clearance: 38.9 ml/min (by C-G formula based on Cr of 1.03).  Medical History: Past Medical History  Diagnosis Date  . Long-term (current) use of anticoagulants   . Chronic shoulder pain   . Heart palpitations     rare  . Memory problem     "a little short term and long term memory loss" (03/31/2014)  . Depression   . Hyperlipidemia   . Dyslipidemia   . Seasonal allergies   . Anxiety   . Hypertension   . CHF (congestive heart failure)   . Atrial fibrillation   . Humerus fracture 03/31/2014    "fell and broke left arm"  . Kidney stones   . Anginal pain   . Blood in urine     "chronic" (03/31/2014)  . Arthritis     "joints" (03/31/2014)  . Osteopenia     "severe" (03/31/2014)  . Syncope and collapse 03/31/2014  . Alzheimer's dementia 03/31/2014    Assessment: 80yo female presents w/ AMS w/ fever to 104, prone to UTIs, UA abnormal, also concerning for sepsis given hypotension. Pharmacy consulted to Vanc/Fortaz. WBC trending up 15.1>>22.1, afebrile, CXR suggestive of pneumonia.  8/23 BCx>> 1/2 GNR 8/23 UCx>> GNR  Goal of Therapy:  Vancomycin trough level 15-20 mcg/ml  Plan:  - Increase Vancomycin 1000mg  IV Q24H  - Increase Fortaz to 1g IV Q12H  - Monitor CBC, clinical progress, and cultures - Monitor renal  funtion  Thank you for allowing pharmacy to be part of this patient's care team  [place], Pharm.D Clinical Pharmacy Resident Pager: [phone] 07/13/2014 .10:52 AM  ______________________________________________ PM: Vancomycin discontinued. Pharmacy will continue to monitor [facility].   Thank you for allowing pharmacy to be part of this patient's care team  [place]oa Freyre M. Laria Grimmett, Pharm.D Clinical Pharmacy Resident Pager: [phone] 07/13/2014 .2:06 PM

## 2014-07-13 NOTE — Progress Notes (Signed)
PULMONARY / CRITICAL CARE MEDICINE   Name: Penny [name] MRN: [id number] DOB: 16-May-1929    ADMISSION DATE:  07/11/2014 CONSULTATION DATE:  07/11/14  REFERRING MD :  Mingo Amber, EDP  CHIEF COMPLAINT:  [name]  INITIAL PRESENTATION:  80 yr old h/o stents for stones adm 8/23 with severe sepsis/septic shock, MODS, lactic acidosis and bilateral stones with L hydronephrosis. 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 Obstructing stone in the lower pole moiety on the right and in the upper pole more [place]he on the left. Diffuse inflammatory infiltration around the kidneys and throughout the upper abdomen and mesentery with small amount of ascites. Inflammatory changes also demonstrated in the gallbladder with evidence of mild pneumobilia and gallbladder sludge. Left mid abdominal mesenteric mass, stable since previous study 8/24:  IR placement of L PCN with drainage of very purulent urine  LINES/TUBES: ETT 8/24 >> 8/25 R IJ CVL 8/24 >>  R radial art line 8/24 >>   MICRO: Urine 8/24 >> morganella morganii Blood 8/24 >> 1/2  GNRs >>  Resp 8/24 >>  Urine (from PCN tube) 8/24 >>   ABX: Vanc 8/24 >> 8/25 Ceftaz 8/24 >>  Cipro 8/25 >>   SUBJECTIVE:  RASS -1. + F/C. Passed SBT. Extubated. Comfortable  VITAL SIGNS: Temp:  [97.5 F (36.4 C)-98.6 F (37 C)] 97.8 F (36.6 C) (08/25 1301) Pulse Rate:  [58-128] 87 (08/25 1415) Resp:  [9-30] 12 (08/25 1415) BP: (67-142)/(39-96) 97/68 mmHg (08/25 1400) SpO2:  [95 %-100 %] 99 % (08/25 1415) Arterial Line BP: (54-152)/(38-94) 125/69 mmHg (08/25 1415) FiO2 (%):  [40 %] 40 % (08/25 1200) Weight:  [66.2 kg (145 lb 15.1 oz)] 66.2 kg (145 lb 15.1 oz) (08/25 0500) HEMODYNAMICS: CVP:  [13 mmHg] 13 mmHg VENTILATOR SETTINGS: Vent Mode:  [-] PSV;CPAP FiO2 (%):  [40 %] 40 % Set Rate:  [20 bmp] 20 bmp Vt Set:  [460 mL] 460 mL PEEP:  [5 cmH20]  5 cmH20 Pressure Support:  [5 cmH20] 5 cmH20 Plateau Pressure:  [7 cmH20-16 cmH20] 7 cmH20 INTAKE / OUTPUT:  Intake/Output Summary (Last 24 hours) at 07/13/14 1513 Last data filed at 07/13/14 1400  Gross per 24 hour  Intake 2104.48 ml  Output    980 ml  Net 1124.48 ml    PHYSICAL EXAMINATION: General:  NAD Neuro:  No focal defcits HEENT:  WNL Cardiovascular:  IRIR, no M, rate cotnrolled Lungs:  CTA bilaterally. Abdomen:  Soft, NT, ND and +BS. Ext:  -edema and -tenderness.  LABS: I have reviewed all of today's lab results. Relevant abnormalities are discussed in the A/P section  CXR: NACPD  ASSESSMENT / PLAN:  INFECTIOUS A:  Severe sepsis Upper tract UTI Bilateral ureteral stones L hydronephrosis GNR bacteremia P:   S/P L PCN by IR 8/24 micro and abx as above  CARDIOVASCULAR A: Septic shock Chronic AF, rate controlled P:  Holding xarelto Cont vasopressors to maintain MAP > 16mmHg Cont empiric stress dose steroids for now  PULMONARY A: VDRF due to severe sepsis, lactic acidosis - resolved P:   Supplemental O2 as needed Monitor resp stats in ICU  RENAL A:   AKI likely ATN due to sepsis, improving Severe metabolic acidosis, lactate Hypokalemia P:   Monitor BMET intermittently Monitor I/Os Correct electrolytes as indicated HCO3 gtt stopped  Maintenance IVFs 8/25  GASTROINTESTINAL A:   No acute issues P:   SUP: N/I post  extubation Advance diet as tolerated  HEMATOLOGIC A:   Thrombocytopenia, probable low grade DIC Coagulopathy, prolonged PT - improving P:  Holding xaralto  DVT px: SCDs Monitor CBC intermittently Transfuse per usual ICU guidelines  ENDOCRINE A:   Suspect relative AI Stress/steroids induced hyperglycemia without prior DM P:   Cont hydrocort - taper as tolerated by BP Cont SSI  NEUROLOGIC A:  Septic Encephalopathy, improving P:   RASS goal: 0 Low dose PRN fentanyl  TODAY'S SUMMARY:    I have personally  obtained a history, examined the patient, evaluated laboratory and imaging results, formulated the assessment and plan and placed orders.  CRITICAL CARE: The patient is critically ill with multiple organ systems failure and requires high complexity decision making for assessment and support, frequent evaluation and titration of therapies, application of advanced monitoring technologies and extensive interpretation of multiple databases. Critical Care Time devoted to patient care services described in this note is 40 minutes.   Merton Border, MD ; [facility] [phone].  After 5:30 PM or weekends, call [phone] Pulmonary and [place] Pager: [phone]  07/13/2014, 3:13 PM

## 2014-07-13 NOTE — Progress Notes (Signed)
Subjective: Pt now extubated; answers questions appropriately; husband in room  Objective: Vital signs in last 24 hours: Temp:  [97.5 F (36.4 C)-98.1 F (36.7 C)] 97.8 F (36.6 C) (08/25 1301) Pulse Rate:  [58-128] 78 (08/25 1515) Resp:  [7-30] 11 (08/25 1515) BP: (67-142)/(39-96) 96/60 mmHg (08/25 1500) SpO2:  [95 %-100 %] 98 % (08/25 1515) Arterial Line BP: (54-152)/(38-81) 90/55 mmHg (08/25 1515) FiO2 (%):  [40 %] 40 % (08/25 1200) Weight:  [145 lb 15.1 oz (66.2 kg)] 145 lb 15.1 oz (66.2 kg) (08/25 0500) Last BM Date:  (PTA)  Intake/Output from previous day: 08/24 0701 - 08/25 0700 In: 3590.7 [I.V.:2171.8; Blood:466; NG/GT:252.9; IV Piggyback:700] Out: 800 [Urine:800] Intake/Output this shift: Total I/O In: 592.9 [I.V.:412.9; NG/GT:80; IV Piggyback:100] Out: 180 [Urine:180]  Left PCN intact , mildly tender, output about 200 cc's blood tinged urine; insertion site ok; creat 1.03; f/u urine cx's neg  Lab Results:   Recent Labs  07/12/14 0848 07/13/14 0500  WBC 21.9* 22.1*  HGB 13.2 12.0  HCT 40.8 35.5*  PLT 110* 90*  92*   BMET  Recent Labs  07/12/14 0848 07/13/14 0500  NA 139 141  K 3.3* 3.7  CL 112 103  CO2 11* 22  GLUCOSE 236* 127*  BUN 19 24*  CREATININE 1.15* 1.03  CALCIUM 6.5* 7.5*   PT/INR  Recent Labs  07/12/14 0848 07/13/14 0500  LABPROT 33.6* 25.3*  INR 3.31* 2.30*   ABG  Recent Labs  07/12/14 0717 07/12/14 0835  PHART 7.274* 7.248*  HCO3 15.5* 11.9*    Studies/Results: Ir Perc Nephrostomy Left  07/12/2014   INDICATION: History of bilateral renal stones, now with urosepsis presumably secondary to a partially obstructing stone within the superior moiety of the left kidney. Please perform percutaneous nephrostomy catheter placement for infectious source control. Note, the patient was noted to have an elevated INR however per discussion with Dr. Alva Garnet, the decision was made to proceed with left-sided percutaneous nephrostomy  catheter placement as the patient's coagulopathy is favored to be secondary to her septic state. Nonetheless, in attempts to optimize her coagulopathy, the patient received 2 units of FFP as well as a vitamin K prior to this procedure.  EXAM: 1. ULTRASOUND GUIDANCE FOR PUNCTURE OF THE LEFT RENAL COLLECTING SYSTEM 2. LEFT PERCUTANEOUS NEPHROSTOMY TUBE PLACEMENT.  COMPARISON:  CT abdomen pelvis - 07/12/2014; 05/27/2013; ultrasound fluoroscopic guided percutaneous nephrostomy catheter placement for percutaneous nephrolithotomy -06/04/2013.  MEDICATIONS: The patient is currently admitted to the hospital and receiving intravenous antibiotics. The antibiotic was administered in an appropriate time frame prior to skin puncture.  ANESTHESIA/SEDATION: Fentanyl 12.5 mcg IV; Versed 0.5 mg IV  Total Moderate Sedation Time  15 minutes.  CONTRAST:  15 mL Isovue 300 administered into the collecting system  FLUOROSCOPY TIME:  1 minute 6 seconds.  COMPLICATIONS: None immediate  PROCEDURE: The procedure, risks, benefits, and alternatives were explained to the patient. Questions regarding the procedure were encouraged and answered. The patient understands and consents to the procedure. A timeout was performed prior to the initiation of the procedure.  Pre procedural ultrasound scanning demonstrates moderate dilatation of the superior moiety of the left kidney. The left inferior moiety appears normal without pelvicaliectasis. There is no significant dilatation of either the superior or the inferior moieties of the contralateral right kidney.  The bilateral flanks were prepped with Betadine in a sterile fashion, and a sterile drape was applied covering the operative field. A sterile gown and sterile gloves were used for  the procedure.  Attention was initially paid towards placement of the left-sided percutaneous nephrostomy catheter. Local anesthesia was provided with 1% Lidocaine with epinephrine. Under direct ultrasound guidance, a 21  gauge needle was advanced into moderately dilated collecting system of the superior moiety of the left kidney. An ultrasound image documentation was performed. Access within the collecting system was confirmed with the efflux of foul smelling urine followed by the injection of a small amount of contrast.  Over a Nitrex wire and under intermittent fluoroscopic guidance, the tract was dilated with an Accustick stent. Over a guide wire, a 10-French percutaneous nephrostomy catheter was advanced into the collecting system where the coil was formed and locked. Limited contrast injection was performed and several sport radiographs were obtained in various obliquities confirming access. The catheter was secured at the skin with a Prolene retention suture and a gravity bag was placed. A small amount of aspirated urine was capped and sent to the laboratory for analysis. A dressing was placed.  Attention was now paid towards the right-sided kidney. Again, ultrasound scanning failed to delineate dilatation of either the superior or inferior moieties of the right kidney. Given presumed infection source control following left-sided nephrostomy catheter as well as the patient's coagulopathy, a left-sided percutaneous nephrostomy catheter was not attempted.  The patient tolerated procedure well without immediate postprocedural complication.  FINDINGS: A preprocedural spot radiographic image demonstrates ill-defined opacities overlying the expected location of the superior aspects of the bilateral ureters as demonstrated on preceding abdominal CT.  Ultrasound scanning demonstrates moderate dilatation of the superior moiety of the left renal collecting system. Under direct ultrasound guidance, a posterior calyx within the superior moiety of the left kidney was targeted allowing advancement of an 10-French percutaneous nephrostomy catheter under intermittent fluoroscopic guidance. Contrast injection confirmed appropriate positioning.  Following percutaneous nephrostomy catheter placement, a small sample of foul smelling urine was aspirated, capped and sent the laboratory for analysis.  Given the lack of any right-sided pelvicaliectasis, as well as the patient's elevated INR, a right-sided percutaneous nephrostomy catheter was not attempted.  IMPRESSION: 1. Successful ultrasound and fluoroscopic guided placement of a left sided 10 French PCN. A small sample of foul smelling, brown colored urine was sent to the laboratory for analysis. 2. Given lack of any significant right-sided pelvicaliectasis, the patient's coagulopathic state and presumed infection source control with placement of the left-sided nephrostomy, a right-sided percutaneous nephrostomy catheter was not attempted.  PLAN: Would recommend continued aggressive resuscitation. If patient's renal function and sepsis do not improve, would consider placement of a right-sided percutaneous nephrostomy catheter though again, this may prove difficult secondary to lack of any significant right-sided pelvicaliectasis.  Above findings were discussed with Dr. Alva Garnet at the time of procedure completion.   Electronically Signed   By: Sandi Mariscal M.D.   On: 07/12/2014 14:40   Ir US Guide Bx Asp/drain  07/12/2014   INDICATION: History of bilateral renal stones, now with urosepsis presumably secondary to a partially obstructing stone within the superior moiety of the left kidney. Please perform percutaneous nephrostomy catheter placement for infectious source control. Note, the patient was noted to have an elevated INR however per discussion with Dr. Alva Garnet, the decision was made to proceed with left-sided percutaneous nephrostomy catheter placement as the patient's coagulopathy is favored to be secondary to her septic state. Nonetheless, in attempts to optimize her coagulopathy, the patient received 2 units of FFP as well as a vitamin K prior to this procedure.  EXAM: 1. ULTRASOUND  GUIDANCE FOR  PUNCTURE OF THE LEFT RENAL COLLECTING SYSTEM 2. LEFT PERCUTANEOUS NEPHROSTOMY TUBE PLACEMENT.  COMPARISON:  CT abdomen pelvis - 07/12/2014; 05/27/2013; ultrasound fluoroscopic guided percutaneous nephrostomy catheter placement for percutaneous nephrolithotomy -06/04/2013.  MEDICATIONS: The patient is currently admitted to the hospital and receiving intravenous antibiotics. The antibiotic was administered in an appropriate time frame prior to skin puncture.  ANESTHESIA/SEDATION: Fentanyl 12.5 mcg IV; Versed 0.5 mg IV  Total Moderate Sedation Time  15 minutes.  CONTRAST:  15 mL Isovue 300 administered into the collecting system  FLUOROSCOPY TIME:  1 minute 6 seconds.  COMPLICATIONS: None immediate  PROCEDURE: The procedure, risks, benefits, and alternatives were explained to the patient. Questions regarding the procedure were encouraged and answered. The patient understands and consents to the procedure. A timeout was performed prior to the initiation of the procedure.  Pre procedural ultrasound scanning demonstrates moderate dilatation of the superior moiety of the left kidney. The left inferior moiety appears normal without pelvicaliectasis. There is no significant dilatation of either the superior or the inferior moieties of the contralateral right kidney.  The bilateral flanks were prepped with Betadine in a sterile fashion, and a sterile drape was applied covering the operative field. A sterile gown and sterile gloves were used for the procedure.  Attention was initially paid towards placement of the left-sided percutaneous nephrostomy catheter. Local anesthesia was provided with 1% Lidocaine with epinephrine. Under direct ultrasound guidance, a 21 gauge needle was advanced into moderately dilated collecting system of the superior moiety of the left kidney. An ultrasound image documentation was performed. Access within the collecting system was confirmed with the efflux of foul smelling urine followed by the  injection of a small amount of contrast.  Over a Nitrex wire and under intermittent fluoroscopic guidance, the tract was dilated with an Accustick stent. Over a guide wire, a 10-French percutaneous nephrostomy catheter was advanced into the collecting system where the coil was formed and locked. Limited contrast injection was performed and several sport radiographs were obtained in various obliquities confirming access. The catheter was secured at the skin with a Prolene retention suture and a gravity bag was placed. A small amount of aspirated urine was capped and sent to the laboratory for analysis. A dressing was placed.  Attention was now paid towards the right-sided kidney. Again, ultrasound scanning failed to delineate dilatation of either the superior or inferior moieties of the right kidney. Given presumed infection source control following left-sided nephrostomy catheter as well as the patient's coagulopathy, a left-sided percutaneous nephrostomy catheter was not attempted.  The patient tolerated procedure well without immediate postprocedural complication.  FINDINGS: A preprocedural spot radiographic image demonstrates ill-defined opacities overlying the expected location of the superior aspects of the bilateral ureters as demonstrated on preceding abdominal CT.  Ultrasound scanning demonstrates moderate dilatation of the superior moiety of the left renal collecting system. Under direct ultrasound guidance, a posterior calyx within the superior moiety of the left kidney was targeted allowing advancement of an 10-French percutaneous nephrostomy catheter under intermittent fluoroscopic guidance. Contrast injection confirmed appropriate positioning. Following percutaneous nephrostomy catheter placement, a small sample of foul smelling urine was aspirated, capped and sent the laboratory for analysis.  Given the lack of any right-sided pelvicaliectasis, as well as the patient's elevated INR, a right-sided  percutaneous nephrostomy catheter was not attempted.  IMPRESSION: 1. Successful ultrasound and fluoroscopic guided placement of a left sided 10 French PCN. A small sample of foul smelling, brown colored urine was sent to  the laboratory for analysis. 2. Given lack of any significant right-sided pelvicaliectasis, the patient's coagulopathic state and presumed infection source control with placement of the left-sided nephrostomy, a right-sided percutaneous nephrostomy catheter was not attempted.  PLAN: Would recommend continued aggressive resuscitation. If patient's renal function and sepsis do not improve, would consider placement of a right-sided percutaneous nephrostomy catheter though again, this may prove difficult secondary to lack of any significant right-sided pelvicaliectasis.  Above findings were discussed with Dr. Alva Garnet at the time of procedure completion.   Electronically Signed   By: Sandi Mariscal M.D.   On: 07/12/2014 14:40   Dg Chest Port 1 View  07/13/2014   CLINICAL DATA:  Respiratory failure, shortness of breath.  EXAM: PORTABLE CHEST - 1 VIEW  COMPARISON:  July 12, 2014.  FINDINGS: Stable cardiomediastinal silhouette. Endotracheal tube is in grossly good position with distal tip 2 cm above the carina. Nasogastric tube is seen passing through the esophagus and entering the stomach. Stable right internal jugular catheter line is noted with tip in expected position of the SVC. Status post right shoulder arthroplasty. No pneumothorax is noted. Right lung is clear. Stable left basilar opacity is noted concerning for atelectasis or pneumonia with associated pleural effusion.  IMPRESSION: Stable mild left basilar opacity consistent with pneumonia or atelectasis with associated pleural effusion.   Electronically Signed   By: Sabino Dick M.D.   On: 07/13/2014 07:38   Dg Chest Portable 1 View  07/12/2014   CLINICAL DATA:  Sepsis  EXAM: PORTABLE CHEST - 1 VIEW  COMPARISON:  Portable chest x-ray of  July 11, 2014  FINDINGS: The lungs are adequately inflated. The retrocardiac region on the left remains dense but has improved slightly. There remains partial obscuration of the left hemidiaphragm. The cardiac silhouette is top-normal in size. The pulmonary vascularity is not engorged.  The endotracheal tube tip lies 3.2 cm above the crotch of the carina. The esophagogastric tube tip projects off the inferior margin of the image. The right internal jugular venous catheter tip lies in the proximal SVC.  IMPRESSION: 1. Slightly improved appearance of the left lower lobe since yesterday's study posterior related to intubation. Atelectasis and/or pneumonia here persists. 2. The support tubes and lines are in appropriate position radiographically. There is no postprocedure complication following placement of the right internal jugular venous catheter.   Electronically Signed   By: David  Martinique   On: 07/12/2014 07:44   Dg Chest Port 1 View  07/11/2014   CLINICAL DATA:  Altered mental status.  EXAM: PORTABLE CHEST - 1 VIEW  COMPARISON:  03/31/2014  FINDINGS: Mild cardiac enlargement without vascular congestion. Visualization of the left lung base is limited because the patient's hand is superimposed. Infiltration or atelectasis in the left base is not excluded. No focal consolidation on the right. No pneumothorax. Calcification of the aorta. Postoperative changes in the right shoulder. Thoracic curvature convex towards the right with suggestion of sclerotic vertebrae in the mid/ upper thoracic region. Portion of fracture in the left proximal humerus is again demonstrated.  IMPRESSION: No definite evidence of active pulmonary disease although infiltration or atelectasis cannot be excluded from the left lung base. Appearance of sclerosis in a mid/upper thoracic vertebra. Consider degenerative or metastatic disease.   Electronically Signed   By: Lucienne Capers M.D.   On: 07/11/2014 22:52   Ct Renal Stone  Study  07/12/2014   CLINICAL DATA:  Abdominal pain, bilateral flank pain, UTI and fever.  EXAM: CT RENAL  STONE PROTOCOL  TECHNIQUE: Multidetector CT imaging of the abdomen and pelvis was performed following the standard protocol without intravenous contrast  COMPARISON:  05/1913  FINDINGS: Small bilateral pleural effusions with basilar atelectasis, slightly greater on the left. Mild bronchiectasis. Nodular interstitial infiltrates in the lung bases. Small esophageal hiatal hernia. Coronary artery calcifications.  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 In the lower pole moiety on the right, there is a 7.6 mm ovoid stone with proximal pyelocaliectasis consistent with obstruction. In the upper pole moiety on the left, there is a 7.8 mm stone with proximal hydronephrosis consistent with obstruction. The lower pole moiety on the left in the upper pole moiety on the right are not obstructed. Additional punctate size stones are demonstrated in the lower pole of the right kidney. Bladder is decompressed but no bladder stones are appreciated. There is fairly extensive perirenal edema and stranding with small amount of ascites and edema demonstrated throughout the mesenteric and extending into the pelvis. Small amount of pelvic free fluid. These changes are likely inflammatory and suggest infected stones or pyelonephritis.  The gallbladder wall appears thickened and there is increased density in the gallbladder most likely representing sludge. Suggestion of new mobile area which may indicate gallbladder infection as well. Circumscribed low-attenuation changes throughout the kidneys appear similar to prior study and probably represent cysts. Spleen size is normal. No adrenal gland nodules. Calcification of the abdominal aorta. Normal caliber aorta and inferior vena cava. Stomach, small bowel, and colon are not pathologically enlarged. There is a soft tissue mass with calcification in the  left mid abdomen. This measures about 5.2 x 2.8 cm. This is similar in size and appearance to previous study. Calcified nodules are demonstrated in the subcutaneous fat of the anterior abdominal wall. No free air in the abdomen.  Pelvis: There are bilateral inguinal hernias containing fat. No bowel herniation or obstruction. Uterus appears to be surgically absent. No pelvic mass or lymphadenopathy is suggested. Appendix is not identified. Degenerative changes in the spine and shoulders. Mild lumbar scoliosis. No destructive bone lesions.  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 Obstructing stone in the lower pole moiety on the right and in the upper pole more raphe on the left. Diffuse inflammatory infiltration around the kidneys and throughout the upper abdomen and mesentery with small amount of ascites. Inflammatory changes also demonstrated in the gallbladder with evidence of mild pneumobilia and gallbladder sludge. Left mid abdominal mesenteric mass, stable since previous study.   Electronically Signed   By: Lucienne Capers M.D.   On: 07/12/2014 03:29    Anti-infectives: Anti-infectives   Start     Dose/Rate Route Frequency Ordered Stop   07/14/14 0800  vancomycin (VANCOCIN) IVPB 1000 mg/200 mL premix  Status:  Discontinued     1,000 mg 200 mL/hr over 60 Minutes Intravenous Every 24 hours 07/13/14 1101 07/13/14 1206   07/13/14 2200  cefTAZidime (FORTAZ) 1 g in dextrose 5 % 50 mL IVPB     1 g 100 mL/hr over 30 Minutes Intravenous Every 12 hours 07/13/14 1047     07/13/14 1300  ciprofloxacin (CIPRO) IVPB 400 mg     400 mg 200 mL/hr over 60 Minutes Intravenous Every 12 hours 07/13/14 1206     07/13/14 0800  vancomycin (VANCOCIN) 500 mg in sodium chloride 0.9 % 100 mL IVPB  Status:  Discontinued     500 mg 100 mL/hr over 60 Minutes Intravenous Every 24 hours 07/12/14 0404  07/13/14 1101   07/12/14 0600  cefTAZidime (FORTAZ) 1 g in dextrose 5 % 50 mL IVPB  Status:   Discontinued     1 g 100 mL/hr over 30 Minutes Intravenous Every 24 hours 07/12/14 0404 07/13/14 1047   07/12/14 0145  vancomycin (VANCOCIN) IVPB 1000 mg/200 mL premix  Status:  Discontinued     1,000 mg 200 mL/hr over 60 Minutes Intravenous  Once 07/12/14 0143 07/12/14 0403   07/12/14 0130  vancomycin (VANCOCIN) IVPB 1000 mg/200 mL premix     1,000 mg 200 mL/hr over 60 Minutes Intravenous  Once 07/12/14 0126 07/12/14 0552   07/11/14 2215  cefTRIAXone (ROCEPHIN) 1 g in dextrose 5 % 50 mL IVPB     1 g 100 mL/hr over 30 Minutes Intravenous  Once 07/11/14 2214 07/11/14 2320      Assessment/Plan: s/p left PCN 8/24; cont current tx; follow labs/check final cx's; urology f/u  LOS: 2 days    Colene Mines,D [facility] 07/13/2014

## 2014-07-14 DIAGNOSIS — N12 Tubulo-interstitial nephritis, not specified as acute or chronic: Secondary | ICD-10-CM

## 2014-07-14 DIAGNOSIS — R7881 Bacteremia: Secondary | ICD-10-CM

## 2014-07-14 DIAGNOSIS — B9689 Other specified bacterial agents as the cause of diseases classified elsewhere: Secondary | ICD-10-CM

## 2014-07-14 LAB — COMPREHENSIVE METABOLIC PANEL
ALBUMIN: 2.1 g/dL — AB (ref 3.5–5.2)
ALK PHOS: 84 U/L (ref 39–117)
ALT: 18 U/L (ref 0–35)
AST: 38 U/L — ABNORMAL HIGH (ref 0–37)
Anion gap: 11 (ref 5–15)
BUN: 24 mg/dL — ABNORMAL HIGH (ref 6–23)
CO2: 27 mEq/L (ref 19–32)
Calcium: 7.3 mg/dL — ABNORMAL LOW (ref 8.4–10.5)
Chloride: 102 mEq/L (ref 96–112)
Creatinine, Ser: 0.68 mg/dL (ref 0.50–1.10)
GFR calc Af Amer: >90 mL/min (ref >90)
GFR calc non Af Amer: 79 mL/min — ABNORMAL LOW (ref >90)
GLUCOSE: 94 mg/dL (ref 70–99)
POTASSIUM: 3.6 meq/L — AB (ref 3.7–5.3)
SODIUM: 140 meq/L (ref 137–147)
TOTAL PROTEIN: 4.9 g/dL — AB (ref 6.0–8.3)
Total Bilirubin: 0.9 mg/dL (ref 0.3–1.2)

## 2014-07-14 LAB — CBC
HCT: 33.2 % — ABNORMAL LOW (ref 36.0–46.0)
Hemoglobin: 11 g/dL — ABNORMAL LOW (ref 12.0–15.0)
MCH: 29.3 pg (ref 26.0–34.0)
MCHC: 33.1 g/dL (ref 30.0–36.0)
MCV: 88.3 fL (ref 78.0–100.0)
Platelets: 61 K/uL — ABNORMAL LOW (ref 150–400)
RBC: 3.76 MIL/uL — ABNORMAL LOW (ref 3.87–5.11)
RDW: 14.5 % (ref 11.5–15.5)
WBC: 15.7 K/uL — ABNORMAL HIGH (ref 4.0–10.5)

## 2014-07-14 LAB — CULTURE, BLOOD (ROUTINE X 2)

## 2014-07-14 LAB — GLUCOSE, CAPILLARY
GLUCOSE-CAPILLARY: 87 mg/dL (ref 70–99)
Glucose-Capillary: 124 mg/dL — ABNORMAL HIGH (ref 70–99)
Glucose-Capillary: 72 mg/dL (ref 70–99)
Glucose-Capillary: 78 mg/dL (ref 70–99)
Glucose-Capillary: 99 mg/dL (ref 70–99)

## 2014-07-14 LAB — CULTURE, RESPIRATORY

## 2014-07-14 LAB — CULTURE, RESPIRATORY W GRAM STAIN

## 2014-07-14 MED ORDER — PANTOPRAZOLE SODIUM 40 MG PO TBEC
40.0000 mg | DELAYED_RELEASE_TABLET | Freq: Every day | ORAL | Status: DC
  Administered 2014-07-14: 40 mg via ORAL

## 2014-07-14 MED ORDER — METOPROLOL TARTRATE 12.5 MG HALF TABLET
12.5000 mg | ORAL_TABLET | Freq: Two times a day (BID) | ORAL | Status: DC
  Administered 2014-07-15 – 2014-07-21 (×14): 12.5 mg via ORAL
  Filled 2014-07-14 (×15): qty 1

## 2014-07-14 MED ORDER — DEXTROSE 5 % IV SOLN
1.0000 g | INTRAVENOUS | Status: DC
  Filled 2014-07-14: qty 10

## 2014-07-14 MED ORDER — POTASSIUM CHLORIDE 2 MEQ/ML IV SOLN
INTRAVENOUS | Status: DC
  Administered 2014-07-14: 14:00:00 via INTRAVENOUS
  Filled 2014-07-14 (×2): qty 1000

## 2014-07-14 MED ORDER — MAGNESIUM SULFATE 40 MG/ML IJ SOLN
2.0000 g | Freq: Once | INTRAMUSCULAR | Status: AC
  Administered 2014-07-15: 2 g via INTRAVENOUS
  Filled 2014-07-14: qty 50

## 2014-07-14 NOTE — Progress Notes (Signed)
Subjective: Left PCN placed 8/24 Sepsis; renal stones--hydro Up in bed-better daily  Objective: Vital signs in last 24 hours: Temp:  [97.4 F (36.3 C)-98.3 F (36.8 C)] 98.3 F (36.8 C) (08/26 1257) Pulse Rate:  [78-118] 90 (08/26 1300) Resp:  [6-18] 11 (08/26 1300) BP: (90-120)/(49-72) 109/67 mmHg (08/26 1300) SpO2:  [95 %-100 %] 98 % (08/26 1300) Arterial Line BP: (83-131)/(49-74) 115/64 mmHg (08/26 1300) Weight:  [67.3 kg (148 lb 5.9 oz)] 67.3 kg (148 lb 5.9 oz) (08/26 0348) Last BM Date:  (PTA)  Intake/Output from previous day: 08/25 0701 - 08/26 0700 In: 2624.3 [I.V.:2184.3; NG/GT:80; IV Piggyback:350] Out: 1095 [Urine:1095] Intake/Output this shift: Total I/O In: 710 [I.V.:660; IV Piggyback:50] Out: 165 [Urine:165]  PE: afeb; vss Up in bed U cx: +morganella morganii BC + L PCN intact; output 65 cc yesterday 100 cc in bag now: blood tinged yellow Site clean and dry Wbc 15 (22)  Lab Results:   Recent Labs  07/13/14 0500 07/14/14 0350  WBC 22.1* 15.7*  HGB 12.0 11.0*  HCT 35.5* 33.2*  PLT 90*  92* 61*   BMET  Recent Labs  07/13/14 0500 07/14/14 0350  NA 141 140  K 3.7 3.6*  CL 103 102  CO2 22 27  GLUCOSE 127* 94  BUN 24* 24*  CREATININE 1.03 0.68  CALCIUM 7.5* 7.3*   PT/INR  Recent Labs  07/12/14 0848 07/13/14 0500  LABPROT 33.6* 25.3*  INR 3.31* 2.30*   ABG  Recent Labs  07/12/14 0717 07/12/14 0835  PHART 7.274* 7.248*  HCO3 15.5* 11.9*    Studies/Results: Dg Chest Port 1 View  07/13/2014   CLINICAL DATA:  Respiratory failure, shortness of breath.  EXAM: PORTABLE CHEST - 1 VIEW  COMPARISON:  July 12, 2014.  FINDINGS: Stable cardiomediastinal silhouette. Endotracheal tube is in grossly good position with distal tip 2 cm above the carina. Nasogastric tube is seen passing through the esophagus and entering the stomach. Stable right internal jugular catheter line is noted with tip in expected position of the SVC. Status post  right shoulder arthroplasty. No pneumothorax is noted. Right lung is clear. Stable left basilar opacity is noted concerning for atelectasis or pneumonia with associated pleural effusion.  IMPRESSION: Stable mild left basilar opacity consistent with pneumonia or atelectasis with associated pleural effusion.   Electronically Signed   By: Sabino Dick M.D.   On: 07/13/2014 07:38    Anti-infectives: Anti-infectives   Start     Dose/Rate Route Frequency Ordered Stop   07/15/14 0930  cefTRIAXone (ROCEPHIN) 1 g in dextrose 5 % 50 mL IVPB     1 g 100 mL/hr over 30 Minutes Intravenous Every 24 hours 07/14/14 1136     07/14/14 0800  vancomycin (VANCOCIN) IVPB 1000 mg/200 mL premix  Status:  Discontinued     1,000 mg 200 mL/hr over 60 Minutes Intravenous Every 24 hours 07/13/14 1101 07/13/14 1206   07/13/14 2200  cefTAZidime (FORTAZ) 1 g in dextrose 5 % 50 mL IVPB  Status:  Discontinued     1 g 100 mL/hr over 30 Minutes Intravenous Every 12 hours 07/13/14 1047 07/14/14 1125   07/13/14 1300  ciprofloxacin (CIPRO) IVPB 400 mg  Status:  Discontinued     400 mg 200 mL/hr over 60 Minutes Intravenous Every 12 hours 07/13/14 1206 07/14/14 1124   07/13/14 0800  vancomycin (VANCOCIN) 500 mg in sodium chloride 0.9 % 100 mL IVPB  Status:  Discontinued     500 mg 100  mL/hr over 60 Minutes Intravenous Every 24 hours 07/12/14 0404 07/13/14 1101   07/12/14 0600  cefTAZidime (FORTAZ) 1 g in dextrose 5 % 50 mL IVPB  Status:  Discontinued     1 g 100 mL/hr over 30 Minutes Intravenous Every 24 hours 07/12/14 0404 07/13/14 1047   07/12/14 0145  vancomycin (VANCOCIN) IVPB 1000 mg/200 mL premix  Status:  Discontinued     1,000 mg 200 mL/hr over 60 Minutes Intravenous  Once 07/12/14 0143 07/12/14 0403   07/12/14 0130  vancomycin (VANCOCIN) IVPB 1000 mg/200 mL premix     1,000 mg 200 mL/hr over 60 Minutes Intravenous  Once 07/12/14 0126 07/12/14 0552   07/11/14 2215  cefTRIAXone (ROCEPHIN) 1 g in dextrose 5 % 50 mL IVPB      1 g 100 mL/hr over 30 Minutes Intravenous  Once 07/11/14 2214 07/11/14 2320      Assessment/Plan: s/p * No surgery found *  L PCN placed 8/24 for renal stones- obstruction- hydronephrosis Better daily Output good--blood tinged Will follow Plan per PCCM    LOS: 3 days    Ellerie Arenz A 07/14/2014

## 2014-07-14 NOTE — Progress Notes (Addendum)
ANTIBIOTIC CONSULT NOTE - INITIAL  Pharmacy Consult for Rocephin Indication: Morganella (GNR) bacteremia  Allergies  Allergen Reactions  . Sulfa Antibiotics Rash  . Zocor [Simvastatin] Other (See Comments)    Patient Measurements: Height: 5' 4.17" (163 cm) Weight: 148 lb 5.9 oz (67.3 kg) IBW/kg (Calculated) : 55.1  Vital Signs: Temp: 97.4 F (36.3 C) (08/26 0808) Temp src: Oral (08/26 0808) BP: 94/49 mmHg (08/26 1000) Pulse Rate: 91 (08/26 1000)  Labs:  Recent Labs  07/12/14 0848 07/13/14 0500 07/14/14 0350  WBC 21.9* 22.1* 15.7*  HGB 13.2 12.0 11.0*  PLT 110* 90*  92* 61*  CREATININE 1.15* 1.03 0.68   Estimated Creatinine Clearance: 50.5 ml/min (by C-G formula based on Cr of 0.68).  Medical History: Past Medical History  Diagnosis Date  . Long-term (current) use of anticoagulants   . Chronic shoulder pain   . Heart palpitations     rare  . Memory problem     "a little short term and long term memory loss" (03/31/2014)  . Depression   . Hyperlipidemia   . Dyslipidemia   . Seasonal allergies   . Anxiety   . Hypertension   . CHF (congestive heart failure)   . Atrial fibrillation   . Humerus fracture 03/31/2014    "fell and broke left arm"  . Kidney stones   . Anginal pain   . Blood in urine     "chronic" (03/31/2014)  . Arthritis     "joints" (03/31/2014)  . Osteopenia     "severe" (03/31/2014)  . Syncope and collapse 03/31/2014  . Alzheimer's dementia 03/31/2014    Assessment: 80yo female presents w/ AMS w/ fever, prone to UTIs.  Started on broad spectrum antibiotics, organism has now been identified with susceptibilities. Pharmacy consulted to de-escalate therapy to ceftriaxone. WBC back down 21.9>15.7, afebrile.  8/23 BCx>> 1/2 Morganella 8/23 UCx>> Morganella  Goal of Therapy:  Eradication of infection  Plan:  - Discontinue Cipro - Discontinue Fortaz - Begin ceftriaxone 1g IV q24h tomorrow  - Monitor CBC, clinical progress, and cultures -  No further adjustment necessary, pharmacy signing off, please reconsult if needed  Thank you for allowing pharmacy to be part of this patient's care team  Hughes, Pharm.D Clinical Pharmacy Resident Pager: [phone] 07/14/2014 .11:26 AM

## 2014-07-14 NOTE — Care Management Note (Addendum)
    Page 1 of 2   07/21/2014     12:59:13 PM CARE MANAGEMENT NOTE 07/21/2014  Patient:  Penny [name], Penny [name]   Account Number:  [id number]  Date Initiated:  07/14/2014  Documentation initiated by:  Elissa Hefty  Subjective/Objective Assessment:   adm w septic shock     Action/Plan:   lives w husband, pcp dr Viona Gilmore Brigitte Pulse   Anticipated DC Date:  07/21/2014   Anticipated DC Plan:  [place]  CM consult      [facility] Choice  HOME HEALTH   Choice offered to / List presented to:  C-1 Patient        [place] arranged  [name]   Status of service:  Completed, signed off Medicare Important Message given?  YES (If response is "NO", the following Medicare IM given date fields will be blank) Date Medicare IM given:  07/14/2014 Medicare IM given by:  Elissa Hefty Date Additional Medicare IM given:  07/19/2014 Additional Medicare IM given by:  Tomi Bamberger  Discharge Disposition:  [place]  Per UR Regulation:  Reviewed for med. necessity/level of care/duration of stay  If discussed at [address] of Stay Meetings, dates discussed:    Comments:  07/21/14 [place], BSN [phone] patient is for dc today, [place] notified, also notified to add hhot.  07/20/14 [place], BSN [phone] patient 's k is 2.7 today, will replete and plan for dc in am.  07/19/14 [place], BSN [phone] per CIR patient is not appropriate for CIR, patient would like to go home with hh services, spouse looks at agency list and states they had [place] before and would like to have [place] again.  Referral made to Sherrin Daisy notified.  Soc will begin 24-48 hrs post dc.  Plan for dc on 07/20/14.

## 2014-07-14 NOTE — Progress Notes (Signed)
PULMONARY / CRITICAL CARE MEDICINE   Name: Penny [name] MRN: [id number] DOB: [birth year]    ADMISSION DATE:  07/11/2014 CONSULTATION DATE:  07/11/14  REFERRING MD :  Mingo Amber, EDP  CHIEF COMPLAINT:  [name]  INITIAL PRESENTATION:  80 yr old h/o stents for stones adm 8/23 with severe sepsis/septic shock, MODS, lactic acidosis and bilateral stones with L hydronephrosis. 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 Obstructing stone in the lower pole moiety on the right and in the upper pole more raphe on the left. Diffuse inflammatory infiltration around the kidneys and throughout the upper abdomen and mesentery with small amount of ascites. Inflammatory changes also demonstrated in the gallbladder with evidence of mild pneumobilia and gallbladder sludge. Left mid abdominal mesenteric mass, stable since previous study 8/24:  IR placement of L PCN with drainage of very purulent urine 8/25 Extubated successfully. Off vasopressors  LINES/TUBES: ETT 8/24 >> 8/25 R radial art line 8/24 >> 8/26 R IJ CVL 8/24 >>    MICRO: Blood 8/23 >> 1/2 morganella morganii Urine 8/23 >> morganella morganii Urine 8/24 >> NEG Resp 8/24 >> NOF Urine (from PCN tube) 8/24 >> GNRs >>   ABX: Vanc 8/24 >> 8/25 Ceftaz 8/24 >> 8/26 Cipro 8/25 >> 8/26 Ceftriaxone 8/26 >>   SUBJECTIVE:  RASS 0. + F/C. Comfortable  VITAL SIGNS: Temp:  [97.4 F (36.3 C)-98.3 F (36.8 C)] 98.3 F (36.8 C) (08/26 1257) Pulse Rate:  [78-118] 83 (08/26 1400) Resp:  [6-18] 8 (08/26 1400) BP: (90-120)/(49-72) 97/59 mmHg (08/26 1400) SpO2:  [95 %-100 %] 99 % (08/26 1400) Arterial Line BP: (87-131)/(49-74) 108/60 mmHg (08/26 1400) Weight:  [67.3 kg (148 lb 5.9 oz)] 67.3 kg (148 lb 5.9 oz) (08/26 0348) HEMODYNAMICS:   VENTILATOR SETTINGS:   INTAKE / OUTPUT:  Intake/Output Summary (Last 24 hours) at 07/14/14  1440 Last data filed at 07/14/14 1400  Gross per 24 hour  Intake 2870.2 ml  Output   1050 ml  Net 1820.2 ml    PHYSICAL EXAMINATION: General:  NAD Neuro:  No focal defcits HEENT:  WNL Cardiovascular:  IRIR, no M, rate controlled Lungs:  CTA bilaterally. Abdomen:  Soft, NT, ND and +BS. Ext:  -edema and -tenderness.  LABS: I have reviewed all of today's lab results. Relevant abnormalities are discussed in the A/P section  CXR: NACPD  ASSESSMENT / PLAN:  INFECTIOUS A:  Severe sepsis Pyelonephritis Bilateral ureteral stones L hydronephrosis GNR bacteremia P:   S/P L PCN by IR 8/24 micro and abx as above  CARDIOVASCULAR A: Septic shock, resolved Chronic AF, rate controlled P:  Holding xarelto Cont vasopressors to maintain MAP > 56mmHg DC empiric stress dose steroids  PULMONARY A: VDRF, resolved P:   Supplemental O2 as needed  RENAL A:   AKI likely ATN, resolved Severe metabolic acidosis, lactate - resolved Hypokalemia, resolved P:   Monitor BMET intermittently Monitor I/Os Correct electrolytes as indicated Adjusted maintenance IVFs 8/26  GASTROINTESTINAL A:   No acute issues P:   SUP: oral PPI - may DC once coagulopathy resolves Advance diet as tolerated  HEMATOLOGIC A:   Thrombocytopenia Coagulopathy, prolonged PT P:  Holding xaralto  DVT px: ambulation Monitor CBC intermittently Transfuse per usual ICU guidelines Monitor coags  ENDOCRINE A:   Suspect relative AI, resolved Stress induced hyperglycemia without prior DM, resolved P:   DC HC DC SSI  NEUROLOGIC A:   Septic  Encephalopathy, resolved Dementia Pain P:   RASS goal: 0 Resume donepezil Cont low dose PRN fentanyl  TODAY'S SUMMARY:    I have personally obtained a history, examined the patient, evaluated laboratory and imaging results, formulated the assessment and plan and placed orders.  CRITICAL CARE: The patient is critically ill with multiple organ systems  failure and requires high complexity decision making for assessment and support, frequent evaluation and titration of therapies, application of advanced monitoring technologies and extensive interpretation of multiple databases. Critical Care Time devoted to patient care services described in this note is --- minutes.   Merton Border, MD ; [facility] [phone].  After 5:30 PM or weekends, call [phone]  07/14/2014, 2:40 PM

## 2014-07-15 DIAGNOSIS — A419 Sepsis, unspecified organism: Secondary | ICD-10-CM | POA: Diagnosis present

## 2014-07-15 DIAGNOSIS — I4891 Unspecified atrial fibrillation: Secondary | ICD-10-CM

## 2014-07-15 DIAGNOSIS — R6520 Severe sepsis without septic shock: Secondary | ICD-10-CM | POA: Diagnosis present

## 2014-07-15 LAB — BASIC METABOLIC PANEL
Anion gap: 7 (ref 5–15)
BUN: 20 mg/dL (ref 6–23)
CHLORIDE: 102 meq/L (ref 96–112)
CO2: 32 mEq/L (ref 19–32)
Calcium: 8 mg/dL — ABNORMAL LOW (ref 8.4–10.5)
Creatinine, Ser: 0.65 mg/dL (ref 0.50–1.10)
GFR, EST NON AFRICAN AMERICAN: 80 mL/min — AB (ref >90)
Glucose, Bld: 110 mg/dL — ABNORMAL HIGH (ref 70–99)
POTASSIUM: 3.8 meq/L (ref 3.7–5.3)
SODIUM: 141 meq/L (ref 137–147)

## 2014-07-15 LAB — GLUCOSE, CAPILLARY: Glucose-Capillary: 76 mg/dL (ref 70–99)

## 2014-07-15 LAB — CBC
HCT: 34.4 % — ABNORMAL LOW (ref 36.0–46.0)
HEMOGLOBIN: 11.3 g/dL — AB (ref 12.0–15.0)
MCH: 29.7 pg (ref 26.0–34.0)
MCHC: 32.8 g/dL (ref 30.0–36.0)
MCV: 90.5 fL (ref 78.0–100.0)
PLATELETS: 61 10*3/uL — AB (ref 150–400)
RBC: 3.8 MIL/uL — AB (ref 3.87–5.11)
RDW: 14.2 % (ref 11.5–15.5)
WBC: 15.7 10*3/uL — AB (ref 4.0–10.5)

## 2014-07-15 LAB — APTT: APTT: 36 s (ref 24–37)

## 2014-07-15 LAB — MAGNESIUM: MAGNESIUM: 2 mg/dL (ref 1.5–2.5)

## 2014-07-15 LAB — PROTIME-INR
INR: 1.24 (ref 0.00–1.49)
PROTHROMBIN TIME: 15.6 s — AB (ref 11.6–15.2)

## 2014-07-15 MED ORDER — METOPROLOL TARTRATE 12.5 MG HALF TABLET: 12.5000 mg | ORAL_TABLET | Freq: Every morning | ORAL | Status: DC

## 2014-07-15 MED ORDER — RIVAROXABAN 20 MG PO TABS
20.0000 mg | ORAL_TABLET | Freq: Every day | ORAL | Status: DC
  Administered 2014-07-16 – 2014-07-20 (×5): 20 mg via ORAL
  Filled 2014-07-15 (×6): qty 1

## 2014-07-15 MED ORDER — DONEPEZIL HCL 10 MG PO TABS
10.0000 mg | ORAL_TABLET | Freq: Every day | ORAL | Status: DC
  Administered 2014-07-15 – 2014-07-20 (×6): 10 mg via ORAL
  Filled 2014-07-15 (×7): qty 1

## 2014-07-15 MED ORDER — BOOST PLUS PO LIQD
237.0000 mL | Freq: Three times a day (TID) | ORAL | Status: DC
  Administered 2014-07-15 – 2014-07-16 (×2): 237 mL via ORAL
  Filled 2014-07-15 (×8): qty 237

## 2014-07-15 MED ORDER — DIGOXIN 125 MCG PO TABS
0.1250 mg | ORAL_TABLET | ORAL | Status: DC
  Administered 2014-07-16 – 2014-07-21 (×3): 0.125 mg via ORAL
  Filled 2014-07-15 (×4): qty 1

## 2014-07-15 MED ORDER — CEFTRIAXONE SODIUM 1 G IJ SOLR
1.0000 g | INTRAMUSCULAR | Status: DC
  Administered 2014-07-15 – 2014-07-19 (×5): 1 g via INTRAVENOUS
  Filled 2014-07-15 (×5): qty 10

## 2014-07-15 NOTE — Progress Notes (Signed)
PULMONARY / CRITICAL CARE MEDICINE   Name: Penny [name] MRN: [id number] DOB: 21-Oct-1929    ADMISSION DATE:  07/11/2014 CONSULTATION DATE:  07/11/14  REFERRING MD :  Mingo Amber, EDP  CHIEF COMPLAINT:  [name]  INITIAL PRESENTATION:  80 yr old h/o stents for stones adm 8/23 with severe sepsis/septic shock, MODS, lactic acidosis and bilateral stones with L hydronephrosis. 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 Obstructing stone in the lower pole moiety on the right and in the upper pole more raphe on the left. Diffuse inflammatory infiltration around the kidneys and throughout the upper abdomen and mesentery with small amount of ascites. Inflammatory changes also demonstrated in the gallbladder with evidence of mild pneumobilia and gallbladder sludge. Left mid abdominal mesenteric mass, stable since previous study 8/24:  IR placement of L PCN with drainage of very purulent urine 8/25 Extubated successfully. Off vasopressors  LINES/TUBES: ETT 8/24 >> 8/25 R radial art line 8/24 >> 8/26 R IJ CVL 8/24 >> 8/27   MICRO: Blood 8/23 >> 1/2 morganella morganii Urine 8/23 >> morganella morganii Urine 8/24 >> NEG Resp 8/24 >> NOF Urine (from PCN tube) 8/24 >> morganella morganii   ABX: Vanc 8/24 >> 8/25 Ceftaz 8/24 >> 8/26 Cipro 8/25 >> 8/26 Ceftriaxone 8/26 >>   SUBJECTIVE:  RASS 0. + F/C. Comfortable. Mildly confused  VITAL SIGNS: Temp:  [97.4 F (36.3 C)-98 F (36.7 C)] 97.4 F (36.3 C) (08/27 0800) Pulse Rate:  [80-112] 80 (08/27 1100) Resp:  [8-20] 13 (08/27 1100) BP: (95-134)/(49-87) 119/77 mmHg (08/27 1100) SpO2:  [97 %-99 %] 99 % (08/27 1100) Arterial Line BP: (108-131)/(60-72) 131/72 mmHg (08/26 1600) Weight:  [68.7 kg (151 lb 7.3 oz)] 68.7 kg (151 lb 7.3 oz) (08/27 0700) HEMODYNAMICS:   VENTILATOR SETTINGS:   INTAKE / OUTPUT:  Intake/Output Summary  (Last 24 hours) at 07/15/14 1349 Last data filed at 07/15/14 1100  Gross per 24 hour  Intake   1430 ml  Output   1110 ml  Net    320 ml    PHYSICAL EXAMINATION: General:  NAD Neuro:  No focal defcits HEENT:  WNL Cardiovascular:  IRIR, no M, rate controlled Lungs:  CTA bilaterally. Abdomen:  Soft, NT, ND and +BS. Ext:  -edema and -tenderness.  LABS: I have reviewed all of today's lab results. Relevant abnormalities are discussed in the A/P section  CXR: NNF  ASSESSMENT / PLAN:  INFECTIOUS A:  Severe sepsis  Bilateral ureteral stones L hydronephrosis Pyelonephritis GNR bacteremia - morganella P:   S/P L PCN by IR 8/24 micro and abx as above I have asked Urology to see her (has seen [place] in past)  CARDIOVASCULAR A: Septic shock, resolved Chronic AF, rate controlled P:  Resume Xarelto 8/28 AM Monitor on tele after transfer out of ICU  PULMONARY A: VDRF, resolved P:   Supplemental O2 as needed  RENAL A:   AKI/ATN, resolved Severe metabolic acidosis, lactate - resolved Hypokalemia, resolved P:   Monitor BMET intermittently Monitor I/Os Correct electrolytes as indicated  GASTROINTESTINAL A:   No acute issues P:   SUP: N/I now that coagulopathy has nearly resolved Cont current diet as tolerated  HEMATOLOGIC A:   Thrombocytopenia Coagulopathy, resolving P:  Resume rivaroxaban 8/28  Monitor CBC intermittently Transfuse per usual guidelines  ENDOCRINE A:   Suspect relative AI, resolved Stress induced hyperglycemia without prior DM, resolved P:   Monitor glu  on chem panels Consider resumption of SSI for glu > 180  NEUROLOGIC A:   Septic Encephalopathy, resolved Dementia Pain, resolved P:   RASS goal: 0 Cont donepezil Cont low dose PRN fentanyl  TODAY'S SUMMARY:  Transfer to tele bed. TRH to assume care as of AM 8/28 and PCCM will sign off at that time. Discussed with Dr Sherral Hammers. Urology consultation has been requested to address  definitive mgmt of kidney stones   Merton Border, MD ; [facility] service Mobile [phone].  After 5:30 PM or weekends, call [phone]  07/15/2014, 1:49 PM

## 2014-07-15 NOTE — Progress Notes (Signed)
Subjective: L hydronephrosis [name] L PCN placed 8/24 Better daily  Objective: Vital signs in last 24 hours: Temp:  [97.4 F (36.3 C)-98.3 F (36.8 C)] 97.4 F (36.3 C) (08/27 0800) Pulse Rate:  [81-112] 87 (08/27 0800) Resp:  [7-20] 14 (08/27 0800) BP: (94-126)/(49-87) 123/73 mmHg (08/27 0800) SpO2:  [97 %-100 %] 99 % (08/27 0800) Arterial Line BP: (88-131)/(57-72) 131/72 mmHg (08/26 1600) Weight:  [68.7 kg (151 lb 7.3 oz)] 68.7 kg (151 lb 7.3 oz) (08/27 0700) Last BM Date:  (PTA)  Intake/Output from previous day: 08/26 0701 - 08/27 0700 In: 1755 [I.V.:1645; IV Piggyback:100] Out: 1060 [Urine:1060] Intake/Output this shift: Total I/O In: 120 [I.V.:120] Out: 40 [Urine:40]  PE:  Sleepy today L PCN in place Site clean and dry; NT Output 270 cc yesterday 40 cc in bag- yellow- clearing Bun/Cr: 20/.65 (24/.68)   Lab Results:   Recent Labs  07/14/14 0350 07/15/14 0545  WBC 15.7* 15.7*  HGB 11.0* 11.3*  HCT 33.2* 34.4*  PLT 61* 61*   BMET  Recent Labs  07/14/14 0350 07/15/14 0545  NA 140 141  K 3.6* 3.8  CL 102 102  CO2 27 32  GLUCOSE 94 110*  BUN 24* 20  CREATININE 0.68 0.65  CALCIUM 7.3* 8.0*   PT/INR  Recent Labs  07/13/14 0500 07/15/14 0545  LABPROT 25.3* 15.6*  INR 2.30* 1.24   ABG No results found for this basename: PHART, PCO2, PO2, HCO3,  in the last 72 hours  Studies/Results: No results found.  Anti-infectives: Anti-infectives   Start     Dose/Rate Route Frequency Ordered Stop   07/15/14 0930  cefTRIAXone (ROCEPHIN) 1 g in dextrose 5 % 50 mL IVPB  Status:  Discontinued     1 g 100 mL/hr over 30 Minutes Intravenous Every 24 hours 07/14/14 1136 07/15/14 0831   07/15/14 0930  cefTRIAXone (ROCEPHIN) 1 g in dextrose 5 % 50 mL IVPB     1 g 100 mL/hr over 30 Minutes Intravenous Every 24 hours 07/15/14 0831     07/14/14 0800  vancomycin (VANCOCIN) IVPB 1000 mg/200 mL premix  Status:  Discontinued     1,000 mg 200 mL/hr over 60  Minutes Intravenous Every 24 hours 07/13/14 1101 07/13/14 1206   07/13/14 2200  cefTAZidime (FORTAZ) 1 g in dextrose 5 % 50 mL IVPB  Status:  Discontinued     1 g 100 mL/hr over 30 Minutes Intravenous Every 12 hours 07/13/14 1047 07/14/14 1125   07/13/14 1300  ciprofloxacin (CIPRO) IVPB 400 mg  Status:  Discontinued     400 mg 200 mL/hr over 60 Minutes Intravenous Every 12 hours 07/13/14 1206 07/14/14 1124   07/13/14 0800  vancomycin (VANCOCIN) 500 mg in sodium chloride 0.9 % 100 mL IVPB  Status:  Discontinued     500 mg 100 mL/hr over 60 Minutes Intravenous Every 24 hours 07/12/14 0404 07/13/14 1101   07/12/14 0600  cefTAZidime (FORTAZ) 1 g in dextrose 5 % 50 mL IVPB  Status:  Discontinued     1 g 100 mL/hr over 30 Minutes Intravenous Every 24 hours 07/12/14 0404 07/13/14 1047   07/12/14 0145  vancomycin (VANCOCIN) IVPB 1000 mg/200 mL premix  Status:  Discontinued     1,000 mg 200 mL/hr over 60 Minutes Intravenous  Once 07/12/14 0143 07/12/14 0403   07/12/14 0130  vancomycin (VANCOCIN) IVPB 1000 mg/200 mL premix     1,000 mg 200 mL/hr over 60 Minutes Intravenous  Once 07/12/14 0126 07/12/14  5732   07/11/14 2215  cefTRIAXone (ROCEPHIN) 1 g in dextrose 5 % 50 mL IVPB     1 g 100 mL/hr over 30 Minutes Intravenous  Once 07/11/14 2214 07/11/14 2320      Assessment/Plan: s/p * No surgery found *  L PCN placed 8/24 Will follow Renal fxn some better Plan per [facility] Double JJ stent?   LOS: 4 days    Shiana Rappleye A 07/15/2014

## 2014-07-15 NOTE — Progress Notes (Addendum)
NUTRITION FOLLOW UP / CONSULT  Intervention:    Boost Plus PO TID to maximize oral intake, each supplement provides 360 kcals and 14 gm protein.  Consider resuming appetite stimulant.  Nutrition Dx:   Inadequate oral intake related to poor appetite as evidenced by poor intake of meals.  Goal:   Intake to meet >90% of estimated nutrition needs. Unmet.  Monitor:   PO intake, labs, weight trend.  Assessment:   80 yr old h/o stents for stones presents change in MS, [name], [name], lactic acidosis  S/P percutaneous nephrostomy tube on 8/24. Extubated on 8/25. TF has been discontinued. Diet has been advanced to CHO-modified. Intake is poor. Husband reports that he has been purchasing chocolate Boost for patient PTA. She drinks it sometimes. Has also received Remeron in the past for appetite stimulant and it worked well.  Nutrition Focused Physical Exam:  Subcutaneous Fat:  Orbital Region: mild depletion Upper Arm Region: mild depletion Thoracic and Lumbar Region: NA  Muscle:  Temple Region: moderate depletion Clavicle Bone Region: moderate depletion Clavicle and Acromion Bone Region: moderate depletion Scapular Bone Region: NA Dorsal Hand: mild depletion Patellar Region: WNL Anterior Thigh Region: NA Posterior Calf Region: WNL  Edema: none   Height: Ht Readings from Last 1 Encounters:  07/12/14 5' 4.17" (1.63 m)    Weight Status:  Fluctuating with fluid status Wt Readings from Last 1 Encounters:  07/15/14 151 lb 7.3 oz (68.7 kg)  07/12/14  133 lb 2.5 oz (60.4 kg)   Re-estimated needs:  Kcal: 1400-1600 Protein: 70-85 gm Fluid: 1.4-1.6 L  Skin: no issues  Diet Order: Carb Control   Intake/Output Summary (Last 24 hours) at 07/15/14 1205 Last data filed at 07/15/14 1100  Gross per 24 hour  Intake   1540 ml  Output   1110 ml  Net    430 ml    Last BM: PTA   Labs:   Recent Labs Lab 07/11/14 2237 07/12/14 0347  07/13/14 0500 07/14/14 0350  07/15/14 0500 07/15/14 0545  NA 144 143  < > 141 140  --  141  K 3.2* 3.0*  < > 3.7 3.6*  --  3.8  CL 110 117*  < > 103 102  --  102  CO2 16* 13*  < > 22 27  --  32  BUN 19 19  < > 24* 24*  --  20  CREATININE 1.28* 1.12*  < > 1.03 0.68  --  0.65  CALCIUM 8.5 6.6*  < > 7.5* 7.3*  --  8.0*  MG  --  1.3*  --  1.4*  --  2.0  --   PHOS  --  2.7  --  2.2*  --   --   --   GLUCOSE 94 121*  < > 127* 94  --  110*  < > = values in this interval not displayed.  CBG (last 3)   Recent Labs  07/14/14 0805 07/14/14 1235 07/14/14 1549  GLUCAP 87 124* 99    Scheduled Meds: . cefTRIAXone (ROCEPHIN)  IV  1 g Intravenous Q24H  . chlorhexidine  15 mL Mouth Rinse BID  . metoprolol tartrate  12.5 mg Oral BID  . pantoprazole  40 mg Oral QHS  . [START ON 07/16/2014] rivaroxaban  20 mg Oral Q supper    Continuous Infusions: . sodium chloride 10 mL/hr (07/12/14 2115)    Molli Barrows, RD, LDN, [place] Pager [phone] After Hours Pager [phone]

## 2014-07-16 DIAGNOSIS — I10 Essential (primary) hypertension: Secondary | ICD-10-CM

## 2014-07-16 LAB — CULTURE, ROUTINE-ABSCESS

## 2014-07-16 MED ORDER — FLUCONAZOLE 100MG IVPB
100.0000 mg | INTRAVENOUS | Status: DC
  Administered 2014-07-16 – 2014-07-19 (×4): 100 mg via INTRAVENOUS
  Filled 2014-07-16 (×4): qty 50

## 2014-07-16 MED ORDER — AMOXICILLIN 500 MG PO CAPS
500.0000 mg | ORAL_CAPSULE | Freq: Two times a day (BID) | ORAL | Status: DC
  Administered 2014-07-16 – 2014-07-21 (×11): 500 mg via ORAL
  Filled 2014-07-16 (×12): qty 1

## 2014-07-16 MED ORDER — MIRTAZAPINE 15 MG PO TABS
15.0000 mg | ORAL_TABLET | Freq: Every day | ORAL | Status: DC
  Administered 2014-07-16 – 2014-07-19 (×2): 15 mg via ORAL
  Filled 2014-07-16 (×5): qty 1

## 2014-07-16 MED ORDER — BOOST / RESOURCE BREEZE PO LIQD
1.0000 | Freq: Three times a day (TID) | ORAL | Status: DC
  Administered 2014-07-16 – 2014-07-20 (×12): 1 via ORAL
  Administered 2014-07-20: 0.9875 via ORAL
  Administered 2014-07-21: 1 via ORAL

## 2014-07-16 MED ORDER — HYDROCODONE-ACETAMINOPHEN 5-325 MG PO TABS
1.0000 | ORAL_TABLET | Freq: Four times a day (QID) | ORAL | Status: DC | PRN
  Administered 2014-07-16: 1 via ORAL
  Filled 2014-07-16: qty 1

## 2014-07-16 NOTE — Consult Note (Signed)
Subjective: Patient reports that she is feeling better.  She is currently having no right or left flank pain.  She is still quite weak.  She has no nausea.  Objective: Vital signs in last 24 hours: Temp:  [97.6 F (36.4 C)-98.4 F (36.9 C)] 97.7 F (36.5 C) (08/28 1353) Pulse Rate:  [72-108] 72 (08/28 1353) Resp:  [16-18] 18 (08/28 1353) BP: (117-128)/(63-79) 117/74 mmHg (08/28 1353) SpO2:  [98 %-99 %] 98 % (08/28 1353) Weight:  [68.6 kg (151 lb 3.8 oz)] 68.6 kg (151 lb 3.8 oz) (08/28 0423)  Intake/Output from previous day: 08/27 0701 - 08/28 0700 In: 540 [P.O.:155; I.V.:325; IV Piggyback:50] Out: 8638 [Urine:1265] Intake/Output this shift: Total I/O In: -  Out: 180 [Urine:180]  Physical Exam:  Constitutional: Vital signs reviewed. WD WN in NAD.  She is lethargic   Eyes: PERRL, No scleral icterus.   Pulmonary/Chest: Normal effort Abdominal: Soft. Non-tender, non-distended, bowel sounds are normal, no masses, organomegaly, or guarding present. Left nephrostomy tube present   Lab Results:  Recent Labs  07/14/14 0350 07/15/14 0545  HGB 11.0* 11.3*  HCT 33.2* 34.4*   BMET  Recent Labs  07/14/14 0350 07/15/14 0545  NA 140 141  K 3.6* 3.8  CL 102 102  CO2 27 32  GLUCOSE 94 110*  BUN 24* 20  CREATININE 0.68 0.65  CALCIUM 7.3* 8.0*    Recent Labs  07/15/14 0545  INR 1.24   No results found for this basename: LABURIN,  in the last 72 hours Results for orders placed during the hospital encounter of 07/11/14  URINE CULTURE     Status: None   Collection Time    07/11/14 10:17 PM      Result Value Ref Range Status   Specimen Description URINE, CATHETERIZED   Final   Special Requests NONE   Final   Culture  Setup Time     Final   Value: 07/12/2014 01:52     Performed at [place]     Final   Value: >=100,000 COLONIES/ML     Performed at [facility]   Culture     Final   Value: [facility] MORGANII     Performed at  [facility]   Report Status 07/13/2014 FINAL   Final   Organism ID, Bacteria MORGANELLA MORGANII   Final  CULTURE, BLOOD (ROUTINE X 2)     Status: None   Collection Time    07/11/14 10:37 PM      Result Value Ref Range Status   Specimen Description BLOOD LEFT ARM   Final   Special Requests BOTTLES DRAWN AEROBIC AND ANAEROBIC [place]   Final   Culture  Setup Time     Final   Value: 07/12/2014 01:48     Performed at [facility]   Culture     Final   Value: [facility] MORGANII     Note: Gram Stain Report Called to,Read Back By and Verified With: TERESA CRITE@1450  ON [id number] BY [facility]     Performed at [facility]   Report Status 07/14/2014 FINAL   Final   Organism ID, Bacteria MORGANELLA MORGANII   Final  MRSA PCR SCREENING     Status: None   Collection Time    07/12/14  4:00 AM      Result Value Ref Range Status   MRSA by PCR NEGATIVE  NEGATIVE Final   Comment:  The GeneXpert MRSA Assay (FDA     approved for NASAL specimens     only), is one component of a     comprehensive MRSA colonization     surveillance program. It is not     intended to diagnose MRSA     infection nor to guide or     monitor treatment for     MRSA infections.  CULTURE, BLOOD (ROUTINE X 2)     Status: None   Collection Time    07/12/14  4:45 AM      Result Value Ref Range Status   Specimen Description BLOOD RIGHT HAND   Final   Special Requests BOTTLES DRAWN AEROBIC ONLY 3CC   Final   Culture  Setup Time     Final   Value: 07/12/2014 08:41     Performed at [facility]   Culture     Final   Value:        BLOOD CULTURE RECEIVED NO GROWTH TO DATE CULTURE WILL BE HELD FOR 5 DAYS BEFORE ISSUING A FINAL NEGATIVE REPORT     Performed at [facility]   Report Status PENDING   Incomplete  CULTURE, RESPIRATORY (NON-EXPECTORATED)     Status: None   Collection Time    07/12/14  8:06 AM      Result Value Ref Range Status   Specimen Description TRACHEAL  ASPIRATE   Final   Special Requests NONE   Final   Gram Stain     Final   Value: RARE WBC PRESENT, PREDOMINANTLY MONONUCLEAR     NO SQUAMOUS EPITHELIAL CELLS SEEN     NO ORGANISMS SEEN     Performed at [facility]   Culture     Final   Value: Non-Pathogenic Oropharyngeal-type Flora Isolated.     Performed at [facility]   Report Status 07/14/2014 FINAL   Final  URINE CULTURE     Status: None   Collection Time    07/12/14 10:20 AM      Result Value Ref Range Status   Specimen Description URINE, CATHETERIZED   Final   Special Requests NONE   Final   Culture  Setup Time     Final   Value: 07/12/2014 11:06     Performed at [facility]     Final   Value: NO GROWTH     Performed at [facility]   Culture     Final   Value: NO GROWTH     Performed at [facility]   Report Status 07/13/2014 FINAL   Final  CULTURE, ROUTINE-ABSCESS     Status: None   Collection Time    07/12/14  1:30 PM      Result Value Ref Range Status   Specimen Description ABSCESS BLADDER   Final   Special Requests NONE   Final   Gram Stain     Final   Value: MODERATE WBC PRESENT, PREDOMINANTLY PMN     NO SQUAMOUS EPITHELIAL CELLS SEEN     NO ORGANISMS SEEN     Performed at [facility]   Culture     Final   Value: MODERATE MORGANELLA MORGANII     ABUNDANT ENTEROCOCCUS SPECIES     Performed at [facility]   Report Status 07/16/2014 FINAL   Final   Organism ID, Bacteria MORGANELLA MORGANII   Final   Organism ID, Bacteria ENTEROCOCCUS SPECIES  Final   I reviewed CT images with the patient and her husband. Studies/Results: No results found.  Assessment/Plan:   Bilateral upper urinary tract calculi.  She had significant hydronephrosis/pyelonephrosis on the left, status post percutaneous drainage.  She is asymptomatic from the right side.  She is clinically improving on antibiotics and with drainage.  At this point, I see no need  to drain the right side, as it is in the renal pelvis and she is relatively asymptomatic and probably has minimal if any obstruction on that side.  I would continue her on antibiotics.  The left nephrostomy tube should remain in place until that left kidney has been adequately treated.  I spoke with her husband about treatment down the road-I think it appropriate to wait approximately 2 weeks at least to allow her to settle down, and recover.  Bilateral ureteroscopy could probably adequately manage both of the stones.  I will eventually get this scheduled.  If she does become symptomatic with right-sided pain or recurrent fever, I would then consider right-sided nephrostomy tube as well.   LOS: 5 days   Franchot Gallo M 07/16/2014, 2:01 PM

## 2014-07-16 NOTE — Discharge Instructions (Signed)
Information on my medicine - XARELTO (Rivaroxaban)  This medication education was reviewed with me or my healthcare representative as part of my discharge preparation.  The pharmacist that spoke with me during my hospital stay was:  Lynelle Doctor, [facility]  Why was Xarelto prescribed for you? Xarelto was prescribed for you to reduce the risk of a blood clot forming that can cause a stroke if you have a medical condition called atrial fibrillation (a type of irregular heartbeat).  What do you need to know about xarelto ? Take your Xarelto ONCE DAILY at the same time every day with your evening meal. If you have difficulty swallowing the tablet whole, you may crush it and mix in applesauce just prior to taking your dose.  Take Xarelto exactly as prescribed by your doctor and DO NOT stop taking Xarelto without talking to the doctor who prescribed the medication.  Stopping without other stroke prevention medication to take the place of Xarelto may increase your risk of developing a clot that causes a stroke.  Refill your prescription before you run out.  After discharge, you should have regular check-up appointments with your healthcare provider that is prescribing your Xarelto.  In the future your dose may need to be changed if your kidney function or weight changes by a significant amount.  What do you do if you miss a dose? If you are taking Xarelto ONCE DAILY and you miss a dose, take it as soon as you remember on the same day then continue your regularly scheduled once daily regimen the next day. Do not take two doses of Xarelto at the same time or on the same day.   Important Safety Information A possible side effect of Xarelto is bleeding. You should call your healthcare provider right away if you experience any of the following:   Bleeding from an injury or your nose that does not stop.   Unusual colored urine (red or dark brown) or unusual colored stools (red or black).   Unusual  bruising for unknown reasons.   A serious fall or if you hit your head (even if there is no bleeding).  Some medicines may interact with Xarelto and might increase your risk of bleeding while on Xarelto. To help avoid this, consult your healthcare provider or pharmacist prior to using any new prescription or non-prescription medications, including herbals, vitamins, non-steroidal anti-inflammatory drugs (NSAIDs) and supplements.  This website has more information on Xarelto: [email or web address]

## 2014-07-16 NOTE — Progress Notes (Signed)
[facility] PROGRESS NOTE  SOLYANA NONAKA BTD:[id number] DOB: 1929-04-04 DOA: 07/11/2014 PCP: Marton Redwood, MD  Assessment/Plan: 1. Severe sepsis -Secondary to pyelonephritis -Blood cultures growing Morganella and enterococcus species -Enterococcus species susceptible to ampicillin, Morganella resistant to ampicillin, susceptible to ceftriaxone -Will add amoxicillin to her regimen, we will continue ceftriaxone for now  2. Acute kidney injury -Secondary to postobstructive nephropathy -CT scan showing bilateral proximal ureteral calculi with left sided hydronephrosis -Resolved  3. Bilateral proximal ureteral calculi -Urology following, will likely require extraction either through PCNL or ureteroscopy -Await further recommendations from Dr Diona Fanti. - Status post percutaneous nephrostomy performed at 07/12/2014 -Urine culture positive for Morganella -Amoxicillin added to regimen  4. Hypertension -Continue metoprolol 12.5 mg by mouth twice a day. A pressure stable with last blood pressure 121/63  5. Deconditioning -Consult physical therapy  Code Status: Full Code Family Communication: Spoke with daughter present at bedside Disposition Plan: Family wishing to take her home when medically stable   Consultants:  Urology  PCCM  Procedures: LINES/TUBES:  ETT 8/24 >> 8/25  R radial art line 8/24 >> 8/26  R IJ CVL 8/24 >> 8/27 Percutaneous nephrostomy placed on a 24 2015  Antibiotics:  Ceftriaxone 1 g IV q. 24 hour  Amoxicillin  HPI/Subjective: Patient was assisted out of bed to chair. She is awake alert and following commands  Objective: Filed Vitals:   07/16/14 1005  BP: 121/63  Pulse: 86  Temp:   Resp:     Intake/Output Summary (Last 24 hours) at 07/16/14 1346 Last data filed at 07/16/14 0850  Gross per 24 hour  Intake    135 ml  Output   1130 ml  Net   -995 ml   Filed Weights   07/14/14 0348 07/15/14 0700 07/16/14 0423  Weight: 67.3 kg (148  lb 5.9 oz) 68.7 kg (151 lb 7.3 oz) 68.6 kg (151 lb 3.8 oz)    Exam:   General:  Patient is in no acute distress, she was helped out of bed to bedside chair  Cardiovascular: Regular rate and rhythm normal S1-S2  Respiratory: Normal respiratory effort, having a few bibasal crackles  Abdomen: Soft nontender nondistended  Musculoskeletal: No edema  Data Reviewed: Basic Metabolic Panel:  Recent Labs Lab 07/11/14 2237 07/12/14 0347 07/12/14 0848 07/13/14 0500 07/14/14 0350 07/15/14 0500 07/15/14 0545  NA 144 143 139 141 140  --  141  K 3.2* 3.0* 3.3* 3.7 3.6*  --  3.8  CL 110 117* 112 103 102  --  102  CO2 16* 13* 11* 22 27  --  32  GLUCOSE 94 121* 236* 127* 94  --  110*  BUN 19 19 19  24* 24*  --  20  CREATININE 1.28* 1.12* 1.15* 1.03 0.68  --  0.65  CALCIUM 8.5 6.6* 6.5* 7.5* 7.3*  --  8.0*  MG  --  1.3*  --  1.4*  --  2.0  --   PHOS  --  2.7  --  2.2*  --   --   --    Liver Function Tests:  Recent Labs Lab 07/11/14 2237 07/12/14 0848 07/13/14 0500 07/14/14 0350  AST 48* 55* 44* 38*  ALT 12 17 18 18   ALKPHOS 71 42 59 84  BILITOT 1.6* 1.4* 1.4* 0.9  PROT 4.8* 4.4* 5.3* 4.9*  ALBUMIN 2.2* 2.0* 2.3* 2.1*   No results found for this basename: LIPASE, AMYLASE,  in the last 168 hours No results found for this basename:  AMMONIA,  in the last 168 hours CBC:  Recent Labs Lab 07/11/14 2237 07/12/14 0347 07/12/14 0848 07/13/14 0500 07/14/14 0350 07/15/14 0545  WBC 5.4 15.1* 21.9* 22.1* 15.7* 15.7*  NEUTROABS 4.9  --   --   --   --   --   HGB 12.3 12.3 13.2 12.0 11.0* 11.3*  HCT 38.0 37.8 40.8 35.5* 33.2* 34.4*  MCV 90.9 90.0 91.3 86.8 88.3 90.5  PLT 95* 88* 110* 90*  92* 61* 61*   Cardiac Enzymes:  Recent Labs Lab 07/12/14 0830  TROPONINI <0.30   BNP (last 3 results) No results found for this basename: PROBNP,  in the last 8760 hours CBG:  Recent Labs Lab 07/14/14 0341 07/14/14 0805 07/14/14 1235 07/14/14 1549 07/15/14 2152  GLUCAP 78 87  124* 99 76    Recent Results (from the past 240 hour(s))  URINE CULTURE     Status: None   Collection Time    07/11/14 10:17 PM      Result Value Ref Range Status   Specimen Description URINE, CATHETERIZED   Final   Special Requests NONE   Final   Culture  Setup Time     Final   Value: 07/12/2014 01:52     Performed at [place]     Final   Value: >=100,000 COLONIES/ML     Performed at [facility]   Culture     Final   Value: [facility] MORGANII     Performed at [facility]   Report Status 07/13/2014 FINAL   Final   Organism ID, Bacteria MORGANELLA MORGANII   Final  CULTURE, BLOOD (ROUTINE X 2)     Status: None   Collection Time    07/11/14 10:37 PM      Result Value Ref Range Status   Specimen Description BLOOD LEFT ARM   Final   Special Requests BOTTLES DRAWN AEROBIC AND ANAEROBIC [place]   Final   Culture  Setup Time     Final   Value: 07/12/2014 01:48     Performed at [facility]   Culture     Final   Value: [facility] MORGANII     Note: Gram Stain Report Called to,Read Back By and Verified With: TERESA CRITE@1450  ON [id number] BY [facility]     Performed at [facility]   Report Status 07/14/2014 FINAL   Final   Organism ID, Bacteria MORGANELLA MORGANII   Final  MRSA PCR SCREENING     Status: None   Collection Time    07/12/14  4:00 AM      Result Value Ref Range Status   MRSA by PCR NEGATIVE  NEGATIVE Final   Comment:            The GeneXpert MRSA Assay (FDA     approved for NASAL specimens     only), is one component of a     comprehensive MRSA colonization     surveillance program. It is not     intended to diagnose MRSA     infection nor to guide or     monitor treatment for     MRSA infections.  CULTURE, BLOOD (ROUTINE X 2)     Status: None   Collection Time    07/12/14  4:45 AM      Result Value Ref Range Status   Specimen Description BLOOD RIGHT HAND   Final   Special Requests BOTTLES DRAWN  AEROBIC ONLY 3CC   Final   Culture  Setup Time     Final   Value: 07/12/2014 08:41     Performed at [facility]   Culture     Final   Value:        BLOOD CULTURE RECEIVED NO GROWTH TO DATE CULTURE WILL BE HELD FOR 5 DAYS BEFORE ISSUING A FINAL NEGATIVE REPORT     Performed at [facility]   Report Status PENDING   Incomplete  CULTURE, RESPIRATORY (NON-EXPECTORATED)     Status: None   Collection Time    07/12/14  8:06 AM      Result Value Ref Range Status   Specimen Description TRACHEAL ASPIRATE   Final   Special Requests NONE   Final   Gram Stain     Final   Value: RARE WBC PRESENT, PREDOMINANTLY MONONUCLEAR     NO SQUAMOUS EPITHELIAL CELLS SEEN     NO ORGANISMS SEEN     Performed at [facility]   Culture     Final   Value: Non-Pathogenic Oropharyngeal-type Flora Isolated.     Performed at [facility]   Report Status 07/14/2014 FINAL   Final  URINE CULTURE     Status: None   Collection Time    07/12/14 10:20 AM      Result Value Ref Range Status   Specimen Description URINE, CATHETERIZED   Final   Special Requests NONE   Final   Culture  Setup Time     Final   Value: 07/12/2014 11:06     Performed at [facility]     Final   Value: NO GROWTH     Performed at [facility]   Culture     Final   Value: NO GROWTH     Performed at [facility]   Report Status 07/13/2014 FINAL   Final  CULTURE, ROUTINE-ABSCESS     Status: None   Collection Time    07/12/14  1:30 PM      Result Value Ref Range Status   Specimen Description ABSCESS BLADDER   Final   Special Requests NONE   Final   Gram Stain     Final   Value: MODERATE WBC PRESENT, PREDOMINANTLY PMN     NO SQUAMOUS EPITHELIAL CELLS SEEN     NO ORGANISMS SEEN     Performed at [facility]   Culture     Final   Value: MODERATE MORGANELLA MORGANII     ABUNDANT ENTEROCOCCUS SPECIES     Performed at [facility]   Report Status  07/16/2014 FINAL   Final   Organism ID, Bacteria MORGANELLA MORGANII   Final   Organism ID, Bacteria ENTEROCOCCUS SPECIES   Final     Studies: No results found.  Scheduled Meds: . cefTRIAXone (ROCEPHIN)  IV  1 g Intravenous Q24H  . chlorhexidine  15 mL Mouth Rinse BID  . digoxin  0.125 mg Oral Once per day on Mon Wed Fri  . donepezil  10 mg Oral QHS  . feeding supplement (RESOURCE BREEZE)  1 Container Oral TID BM  . fluconazole (DIFLUCAN) IV  100 mg Intravenous Q24H  . metoprolol tartrate  12.5 mg Oral BID  . mirtazapine  15 mg Oral QHS  . rivaroxaban  20 mg Oral Q supper   Continuous Infusions: . sodium chloride Stopped (07/15/14 1330)    Active Problems:  Septic shock   Acute respiratory failure   Hypokalemia   Pyelonephritis   Bacteremia due to Gram-negative bacteria   Severe sepsis(995.92)    Time spent: 35 min    Kelvin Cellar  [facility] Pager [phone]. If 7PM-7AM, please contact night-coverage at [email or web address], password [facility] 07/16/2014, 1:46 PM  LOS: 5 days

## 2014-07-16 NOTE — Consult Note (Addendum)
Urology Consult  Referring physician: Merton Border, MD Reason for referral: Bilateral ureteral calculi.  Chief Complaint: [name]  History of Present Illness: The patient is an 80 years old female patient of Dr Dahlstedt's who was admitted on 8/23 with urosepsis, septic shock. She was admitted on 8/24 with mental status change and temperature of 104.  She was found on CT scan to have bilateral proximal ureteral calculi with left hydronephrosis.  The stone on the right side appears to be in the renal pelvis.  She is known to have bilateral duplicated systems.  The ureteral stone is in the lower pole moiety of the right kidney and the left one in the upper moiety of the kidney.  There is minimal dilation of the right collecting system. A left percutaneous nephrostomy was done on 8/24. Urine culture from the left kidney was positive for Morganella morganii.  She is currently on Rocephin. There is a history of dementia.  She had a right PCNL in July 2014 for a 12x7 right proximal/mid ureteral stone. Her general condition has improved.  Past Medical History  Diagnosis Date  . Long-term (current) use of anticoagulants   . Chronic shoulder pain   . Heart palpitations     rare  . Memory problem     "a little short term and long term memory loss" (03/31/2014)  . Depression   . Hyperlipidemia   . Dyslipidemia   . Seasonal allergies   . Anxiety   . Hypertension   . CHF (congestive heart failure)   . Atrial fibrillation   . Humerus fracture 03/31/2014    "fell and broke left arm"  . Kidney stones   . Anginal pain   . Blood in urine     "chronic" (03/31/2014)  . Arthritis     "joints" (03/31/2014)  . Osteopenia     "severe" (03/31/2014)  . Syncope and collapse 03/31/2014  . Alzheimer's dementia 03/31/2014   Past Surgical History  Procedure Laterality Date  . Ureteral stent placement Bilateral     for stenosis; "they've been removed"  . Removal of growth  2007    growth under kidney  removed and possibly kindey stones removed  . Knee arthroscopy Right 2011  . Total shoulder arthroplasty Right 10/2007  . Nephrolithotomy Right 06/04/2013    Procedure: NEPHROLITHOTOMY PERCUTANEOUS;  Surgeon: Franchot Gallo, MD;  Location: [facility];  Service: Urology;  Laterality: Right;  . Appendectomy  ~ 1953  . Bladder suspension      w/hysterectomy  . Cardioversion  2006  . Meniscus repair Left 07/2000  . Abdominal hysterectomy      partial    Medications: Rocephin, lanoxin, donepezil,, metoprolol, rivaroxaban. Allergies:  Allergies  Allergen Reactions  . Sulfa Antibiotics Rash  . Zocor [Simvastatin] Other (See Comments)    Family History  Problem Relation Age of Onset  . Heart disease Mother    Social History:  reports that she quit smoking about 33 years ago. Her smoking use included Cigarettes. She has a 1.25 pack-year smoking history. She has never used smokeless tobacco. She reports that she does not drink alcohol or use illicit drugs.  ROS: All systems are reviewed and negative except as noted.   Physical Exam:  Vital signs in last 24 hours: Temp:  [97.4 F (36.3 C)-98.4 F (36.9 C)] 98.4 F (36.9 C) (08/27 2135) Pulse Rate:  [80-108] 108 (08/27 2135) Resp:  [9-19] 16 (08/27 2135) BP: (95-141)/(57-82) 128/79 mmHg (08/27 2135) SpO2:  [97 %-100 %]  98 % (08/27 2135) Weight:  [68.7 kg (151 lb 7.3 oz)] 68.7 kg (151 lb 7.3 oz) (08/27 0700)  Cardiovascular: Skin warm; not flushed Respiratory: Breaths quiet; no shortness of breath Abdomen: No masses.  Has a left nephrostomy catheter that is draining clear urine. Neurological: Normal sensation to touch Musculoskeletal: Normal motor function arms and legs Lymphatics: No inguinal adenopathy Skin: No rashes   Laboratory Data:  Results for orders placed during the hospital encounter of 07/11/14 (from the past 72 hour(s))  GLUCOSE, CAPILLARY     Status: Abnormal   Collection Time    07/13/14  4:03 AM      Result  Value Ref Range   Glucose-Capillary 113 (*) 70 - 99 mg/dL   Comment 1 Documented in Chart     Comment 2 Notify RN    MAGNESIUM     Status: Abnormal   Collection Time    07/13/14  5:00 AM      Result Value Ref Range   Magnesium 1.4 (*) 1.5 - 2.5 mg/dL  PHOSPHORUS     Status: Abnormal   Collection Time    07/13/14  5:00 AM      Result Value Ref Range   Phosphorus 2.2 (*) 2.3 - 4.6 mg/dL  CBC     Status: Abnormal   Collection Time    07/13/14  5:00 AM      Result Value Ref Range   WBC 22.1 (*) 4.0 - 10.5 K/uL   RBC 4.09  3.87 - 5.11 MIL/uL   Hemoglobin 12.0  12.0 - 15.0 g/dL   HCT 35.5 (*) 36.0 - 46.0 %   MCV 86.8  78.0 - 100.0 fL   MCH 29.3  26.0 - 34.0 pg   MCHC 33.8  30.0 - 36.0 g/dL   RDW 14.4  11.5 - 15.5 %   Platelets 90 (*) 150 - 400 K/uL   Comment: CONSISTENT WITH PREVIOUS RESULT  COMPREHENSIVE METABOLIC PANEL     Status: Abnormal   Collection Time    07/13/14  5:00 AM      Result Value Ref Range   Sodium 141  137 - 147 mEq/L   Potassium 3.7  3.7 - 5.3 mEq/L   Chloride 103  96 - 112 mEq/L   Comment: DELTA CHECK NOTED   CO2 22  19 - 32 mEq/L   Glucose, Bld 127 (*) 70 - 99 mg/dL   BUN 24 (*) 6 - 23 mg/dL   Creatinine, Ser 1.03  0.50 - 1.10 mg/dL   Calcium 7.5 (*) 8.4 - 10.5 mg/dL   Total Protein 5.3 (*) 6.0 - 8.3 g/dL   Albumin 2.3 (*) 3.5 - 5.2 g/dL   AST 44 (*) 0 - 37 U/L   ALT 18  0 - 35 U/L   Alkaline Phosphatase 59  39 - 117 U/L   Total Bilirubin 1.4 (*) 0.3 - 1.2 mg/dL   GFR calc non Af Amer 49 (*) >90 mL/min   GFR calc Af Amer 57 (*) >90 mL/min   Comment: (NOTE)     The eGFR has been calculated using the CKD EPI equation.     This calculation has not been validated in all clinical situations.     eGFR's persistently <90 mL/min signify possible Chronic Kidney     Disease.   Anion gap 16 (*) 5 - 15  DIC (DISSEMINATED INTRAVASCULAR COAGULATION) PANEL     Status: Abnormal   Collection Time    07/13/14  5:00  AM      Result Value Ref Range   Prothrombin  Time 25.3 (*) 11.6 - 15.2 seconds   INR 2.30 (*) 0.00 - 1.49   aPTT 44 (*) 24 - 37 seconds   Comment:            IF BASELINE aPTT IS ELEVATED,     SUGGEST PATIENT RISK ASSESSMENT     BE USED TO DETERMINE APPROPRIATE     ANTICOAGULANT THERAPY.   Fibrinogen 533 (*) 204 - 475 mg/dL   D-Dimer, Quant 12.31 (*) 0.00 - 0.48 ug/mL-FEU   Comment:            AT THE INHOUSE ESTABLISHED CUTOFF     VALUE OF 0.48 ug/mL FEU,     THIS ASSAY HAS BEEN DOCUMENTED     IN THE LITERATURE TO HAVE     A SENSITIVITY AND NEGATIVE     PREDICTIVE VALUE OF AT LEAST     98 TO 99%.  THE TEST RESULT     SHOULD BE CORRELATED WITH     AN ASSESSMENT OF THE CLINICAL     PROBABILITY OF DVT / VTE.   Platelets 92 (*) 150 - 400 K/uL   Comment: CONSISTENT WITH PREVIOUS RESULT   Smear Review NO SCHISTOCYTES SEEN    GLUCOSE, CAPILLARY     Status: Abnormal   Collection Time    07/13/14  8:16 AM      Result Value Ref Range   Glucose-Capillary 128 (*) 70 - 99 mg/dL  GLUCOSE, CAPILLARY     Status: Abnormal   Collection Time    07/13/14 12:39 PM      Result Value Ref Range   Glucose-Capillary 123 (*) 70 - 99 mg/dL  GLUCOSE, CAPILLARY     Status: Abnormal   Collection Time    07/13/14  3:58 PM      Result Value Ref Range   Glucose-Capillary 154 (*) 70 - 99 mg/dL  GLUCOSE, CAPILLARY     Status: Abnormal   Collection Time    07/13/14  7:16 PM      Result Value Ref Range   Glucose-Capillary 160 (*) 70 - 99 mg/dL  GLUCOSE, CAPILLARY     Status: None   Collection Time    07/13/14 11:41 PM      Result Value Ref Range   Glucose-Capillary 72  70 - 99 mg/dL  GLUCOSE, CAPILLARY     Status: None   Collection Time    07/14/14  3:41 AM      Result Value Ref Range   Glucose-Capillary 78  70 - 99 mg/dL  CBC     Status: Abnormal   Collection Time    07/14/14  3:50 AM      Result Value Ref Range   WBC 15.7 (*) 4.0 - 10.5 K/uL   RBC 3.76 (*) 3.87 - 5.11 MIL/uL   Hemoglobin 11.0 (*) 12.0 - 15.0 g/dL   HCT 33.2 (*) 36.0 -  46.0 %   MCV 88.3  78.0 - 100.0 fL   MCH 29.3  26.0 - 34.0 pg   MCHC 33.1  30.0 - 36.0 g/dL   RDW 14.5  11.5 - 15.5 %   Platelets 61 (*) 150 - 400 K/uL   Comment: CONSISTENT WITH PREVIOUS RESULT  COMPREHENSIVE METABOLIC PANEL     Status: Abnormal   Collection Time    07/14/14  3:50 AM      Result Value Ref Range   Sodium  140  137 - 147 mEq/L   Potassium 3.6 (*) 3.7 - 5.3 mEq/L   Chloride 102  96 - 112 mEq/L   CO2 27  19 - 32 mEq/L   Glucose, Bld 94  70 - 99 mg/dL   BUN 24 (*) 6 - 23 mg/dL   Creatinine, Ser 0.68  0.50 - 1.10 mg/dL   Comment: DELTA CHECK NOTED   Calcium 7.3 (*) 8.4 - 10.5 mg/dL   Total Protein 4.9 (*) 6.0 - 8.3 g/dL   Albumin 2.1 (*) 3.5 - 5.2 g/dL   AST 38 (*) 0 - 37 U/L   ALT 18  0 - 35 U/L   Alkaline Phosphatase 84  39 - 117 U/L   Total Bilirubin 0.9  0.3 - 1.2 mg/dL   GFR calc non Af Amer 79 (*) >90 mL/min   GFR calc Af Amer >90  >90 mL/min   Comment: (NOTE)     The eGFR has been calculated using the CKD EPI equation.     This calculation has not been validated in all clinical situations.     eGFR's persistently <90 mL/min signify possible Chronic Kidney     Disease.   Anion gap 11  5 - 15  GLUCOSE, CAPILLARY     Status: None   Collection Time    07/14/14  8:05 AM      Result Value Ref Range   Glucose-Capillary 87  70 - 99 mg/dL  GLUCOSE, CAPILLARY     Status: Abnormal   Collection Time    07/14/14 12:35 PM      Result Value Ref Range   Glucose-Capillary 124 (*) 70 - 99 mg/dL  GLUCOSE, CAPILLARY     Status: None   Collection Time    07/14/14  3:49 PM      Result Value Ref Range   Glucose-Capillary 99  70 - 99 mg/dL  MAGNESIUM     Status: None   Collection Time    07/15/14  5:00 AM      Result Value Ref Range   Magnesium 2.0  1.5 - 2.5 mg/dL  BASIC METABOLIC PANEL     Status: Abnormal   Collection Time    07/15/14  5:45 AM      Result Value Ref Range   Sodium 141  137 - 147 mEq/L   Potassium 3.8  3.7 - 5.3 mEq/L   Chloride 102  96 - 112  mEq/L   CO2 32  19 - 32 mEq/L   Glucose, Bld 110 (*) 70 - 99 mg/dL   BUN 20  6 - 23 mg/dL   Creatinine, Ser 0.65  0.50 - 1.10 mg/dL   Calcium 8.0 (*) 8.4 - 10.5 mg/dL   GFR calc non Af Amer 80 (*) >90 mL/min   GFR calc Af Amer >90  >90 mL/min   Comment: (NOTE)     The eGFR has been calculated using the CKD EPI equation.     This calculation has not been validated in all clinical situations.     eGFR's persistently <90 mL/min signify possible Chronic Kidney     Disease.   Anion gap 7  5 - 15  CBC     Status: Abnormal   Collection Time    07/15/14  5:45 AM      Result Value Ref Range   WBC 15.7 (*) 4.0 - 10.5 K/uL   Comment: CONSISTENT WITH PREVIOUS RESULT   RBC 3.80 (*) 3.87 - 5.11 MIL/uL  Hemoglobin 11.3 (*) 12.0 - 15.0 g/dL   Comment: CONSISTENT WITH PREVIOUS RESULT   HCT 34.4 (*) 36.0 - 46.0 %   MCV 90.5  78.0 - 100.0 fL   MCH 29.7  26.0 - 34.0 pg   MCHC 32.8  30.0 - 36.0 g/dL   RDW 14.2  11.5 - 15.5 %   Platelets 61 (*) 150 - 400 K/uL   Comment: CONSISTENT WITH PREVIOUS RESULT  APTT     Status: None   Collection Time    07/15/14  5:45 AM      Result Value Ref Range   aPTT 36  24 - 37 seconds  PROTIME-INR     Status: Abnormal   Collection Time    07/15/14  5:45 AM      Result Value Ref Range   Prothrombin Time 15.6 (*) 11.6 - 15.2 seconds   INR 1.24  0.00 - 1.49  GLUCOSE, CAPILLARY     Status: None   Collection Time    07/15/14  9:52 PM      Result Value Ref Range   Glucose-Capillary 76  70 - 99 mg/dL   Comment 1 Notify RN     Comment 2 Documented in Chart     Recent Results (from the past 240 hour(s))  URINE CULTURE     Status: None   Collection Time    07/11/14 10:17 PM      Result Value Ref Range Status   Specimen Description URINE, CATHETERIZED   Final   Special Requests NONE   Final   Culture  Setup Time     Final   Value: 07/12/2014 01:52     Performed at [place]     Final   Value: >=100,000 COLONIES/ML     Performed at  [facility]   Culture     Final   Value: [facility] MORGANII     Performed at [facility]   Report Status 07/13/2014 FINAL   Final   Organism ID, Bacteria MORGANELLA MORGANII   Final  CULTURE, BLOOD (ROUTINE X 2)     Status: None   Collection Time    07/11/14 10:37 PM      Result Value Ref Range Status   Specimen Description BLOOD LEFT ARM   Final   Special Requests BOTTLES DRAWN AEROBIC AND ANAEROBIC [facility] EACH   Final   Culture  Setup Time     Final   Value: 07/12/2014 01:48     Performed at [facility]   Culture     Final   Value: [facility] MORGANII     Note: Gram Stain Report Called to,Read Back By and Verified With: TERESA CRITE@[email or web address]  ON [id number] BY [facility]     Performed at [facility]   Report Status 07/14/2014 FINAL   Final   Organism ID, Bacteria MORGANELLA MORGANII   Final  MRSA PCR SCREENING     Status: None   Collection Time    07/12/14  4:00 AM      Result Value Ref Range Status   MRSA by PCR NEGATIVE  NEGATIVE Final   Comment:            The GeneXpert MRSA Assay (FDA     approved for NASAL specimens     only), is one component of a     comprehensive MRSA colonization     surveillance program. It is not     intended to  diagnose MRSA     infection nor to guide or     monitor treatment for     MRSA infections.  CULTURE, BLOOD (ROUTINE X 2)     Status: None   Collection Time    07/12/14  4:45 AM      Result Value Ref Range Status   Specimen Description BLOOD RIGHT HAND   Final   Special Requests BOTTLES DRAWN AEROBIC ONLY 3CC   Final   Culture  Setup Time     Final   Value: 07/12/2014 08:41     Performed at [facility]   Culture     Final   Value:        BLOOD CULTURE RECEIVED NO GROWTH TO DATE CULTURE WILL BE HELD FOR 5 DAYS BEFORE ISSUING A FINAL NEGATIVE REPORT     Performed at [facility]   Report Status PENDING   Incomplete  CULTURE, RESPIRATORY (NON-EXPECTORATED)     Status: None   Collection Time     07/12/14  8:06 AM      Result Value Ref Range Status   Specimen Description TRACHEAL ASPIRATE   Final   Special Requests NONE   Final   Gram Stain     Final   Value: RARE WBC PRESENT, PREDOMINANTLY MONONUCLEAR     NO SQUAMOUS EPITHELIAL CELLS SEEN     NO ORGANISMS SEEN     Performed at [facility]   Culture     Final   Value: Non-Pathogenic Oropharyngeal-type Flora Isolated.     Performed at [facility]   Report Status 07/14/2014 FINAL   Final  URINE CULTURE     Status: None   Collection Time    07/12/14 10:20 AM      Result Value Ref Range Status   Specimen Description URINE, CATHETERIZED   Final   Special Requests NONE   Final   Culture  Setup Time     Final   Value: 07/12/2014 11:06     Performed at [facility]     Final   Value: NO GROWTH     Performed at [facility]   Culture     Final   Value: NO GROWTH     Performed at [facility]   Report Status 07/13/2014 FINAL   Final  CULTURE, ROUTINE-ABSCESS     Status: None   Collection Time    07/12/14  1:30 PM      Result Value Ref Range Status   Specimen Description ABSCESS BLADDER   Final   Special Requests NONE   Final   Gram Stain     Final   Value: MODERATE WBC PRESENT, PREDOMINANTLY PMN     NO SQUAMOUS EPITHELIAL CELLS SEEN     NO ORGANISMS SEEN     Performed at [facility]   Culture     Final   Value: MODERATE MORGANELLA MORGANII     Performed at [facility]   Report Status PENDING   Incomplete   Organism ID, Bacteria MORGANELLA MORGANII   Final   Creatinine:  Recent Labs  07/11/14 2237 07/12/14 0347 07/12/14 0848 07/13/14 0500 07/14/14 0350 07/15/14 0545  CREATININE 1.28* 1.12* 1.15* 1.03 0.68 0.65    Xrays: Bilateral proximal ureteral calculi.  Right lower pole renal calculi.  Bilateral duplicated systems.  Left hydronephrosis.  Impression/Assessment:  Bilateral ureteral calculi. Bilateral duplicated systems. Right lower  pole renal  calculi.  Left hydronephrosis.  [name].   Plan:  Patient will need bilateral ureteral stones extraction either via PCNL or ureteroscopy.  I will notify Dr Diona Fanti who will follow the patient.  Arvil Persons 07/16/2014, 12:59 AM    CC: Merton Border, MD

## 2014-07-16 NOTE — Evaluation (Signed)
Physical Therapy Evaluation Patient Details Name: Penny [name] MRN: [id number] DOB: 01-Mar-1929 Today's Date: 07/16/2014   History of Present Illness  The patient is an 80 years old female patient of Dr Dahlstedt's who was admitted on 8/23 with urosepsis, septic shock. She was admitted on 8/24 with mental status change and temperature of 104.  She was found on CT scan to have bilateral proximal ureteral calculi with left hydronephrosis.  She is known to have bilateral duplicated systems.  The right ureteral stone is in the lower pole moiety of the right kidney and in the upper moiety of the left kidney.  There is minimal dilation of the right collecting system. A left percutaneous nephrostomy was done on 8/24. Urine culture from the left kidney was positive for Morganella morganii.  She is currently on Rocephin. There is a history of dementia.  She had a right PCNL in July 2014 for a 12x7 right proximal/mid ureteral stone. Her general condition has improved.  Clinical Impression  Pt presents with decreased mobility, generalized weakness and decreased memory and safety awareness.  PTA pt was using cane with husband providing HHA, however pt is now min/mod A with RW.  Had long discussion with husband regarding D/C plans.  PT recommends [facility], however pt did not agree and husband wants to take her home.  They have 5 STE home and HIGHLY recommend that pt/husband practice prior to D/C for safe entry.  Also recommend use of RW and w/c for community use.  Pts husband states that they may have access to RW and w/c, therefore continue to check with them.     Follow Up Recommendations Home health PT;Supervision/Assistance - 24 hour    Equipment Recommendations  Rolling walker with 5" wheels;Wheelchair cushion (measurements PT);Wheelchair (measurements PT) (check w/ husband, he may have access to them)    Recommendations for Other Services       Precautions / Restrictions Precautions Precautions:  Fall Precaution Comments: hx of dementia Restrictions Weight Bearing Restrictions: No      Mobility  Bed Mobility               General bed mobility comments: Pt in recliner when PT arrived.   Transfers Overall transfer level: Needs assistance Equipment used: Rolling walker (2 wheeled) Transfers: Sit to/from Stand Sit to Stand: Mod assist         General transfer comment: Pt requires mod A for forward weight shift and to activate hips into extension.  Max cues for hand placement for safety.   Ambulation/Gait Ambulation/Gait assistance: Min assist;Mod assist Ambulation Distance (Feet): 45 Feet Assistive device: Rolling walker (2 wheeled) Gait Pattern/deviations: Step-to pattern;Decreased stance time - right;Decreased stance time - left;Decreased stride length;Shuffle;Trunk flexed (tends to "drag" RLE) Gait velocity: decreased      Stairs            Wheelchair Mobility    Modified Rankin (Stroke Patients Only)       Balance Overall balance assessment: Needs assistance Sitting-balance support: Feet supported;Bilateral upper extremity supported Sitting balance-Leahy Scale: Fair   Postural control: Posterior lean (in standing) Standing balance support: During functional activity;Bilateral upper extremity supported Standing balance-Leahy Scale: Poor                               Pertinent Vitals/Pain Pain Assessment: No/denies pain    Home Living Family/patient expects to be discharged to:: Private residence Living Arrangements: Spouse/significant other Available  Help at Discharge: Family;Available 24 hours/day Type of Home: House Home Access: Stairs to enter Entrance Stairs-Rails: Right Entrance Stairs-Number of Steps: 4-5 Home Layout: One level Home Equipment: Cane - single point;Bedside commode Additional Comments: states they may have access to a RW and w/c    Prior Function Level of Independence: Needs assistance   Gait /  Transfers Assistance Needed: Pt has been ambulating with cane and HHA from husband since fall a few months ago  ADL's / Homemaking Assistance Needed: husband does housekeeping and meal prep, pt can sponge bathe, dress, toilet and feed herself        Hand Dominance   Dominant Hand: Right    Extremity/Trunk Assessment               Lower Extremity Assessment: Generalized weakness      Cervical / Trunk Assessment: Kyphotic  Communication   Communication: No difficulties  Cognition Arousal/Alertness: Lethargic Behavior During Therapy: WFL for tasks assessed/performed Overall Cognitive Status: History of cognitive impairments - at baseline       Memory: Decreased short-term memory              General Comments      Exercises        Assessment/Plan    PT Assessment Patient needs continued PT services  PT Diagnosis Difficulty walking;Generalized weakness   PT Problem List Decreased strength;Decreased activity tolerance;Decreased balance;Decreased mobility;Decreased coordination;Decreased cognition;Decreased knowledge of use of DME;Decreased safety awareness;Decreased knowledge of precautions  PT Treatment Interventions DME instruction;Gait training;Stair training;Functional mobility training;Therapeutic activities;Therapeutic exercise;Balance training;Patient/family education   PT Goals (Current goals can be found in the Care Plan section) Acute Rehab PT Goals Patient Stated Goal: to return home with family per husband PT Goal Formulation: With patient/family Time For Goal Achievement: 07/23/14 Potential to Achieve Goals: Fair    Frequency Min 3X/week   Barriers to discharge        Co-evaluation               End of Session Equipment Utilized During Treatment: Gait belt Activity Tolerance: Patient limited by fatigue Patient left: in chair;with call bell/phone within reach;with family/visitor present Nurse Communication: Mobility status          Time: 4665-9935 PT Time Calculation (min): 22 min   Charges:   PT Evaluation $Initial PT Evaluation Tier I: 1 Procedure PT Treatments $Gait Training: 8-22 mins   PT G Codes:          Denice Bors 07/16/2014, 4:25 PM

## 2014-07-17 DIAGNOSIS — N390 Urinary tract infection, site not specified: Secondary | ICD-10-CM

## 2014-07-17 LAB — CBC
HCT: 38.6 % (ref 36.0–46.0)
Hemoglobin: 13 g/dL (ref 12.0–15.0)
MCH: 29.2 pg (ref 26.0–34.0)
MCHC: 33.7 g/dL (ref 30.0–36.0)
MCV: 86.7 fL (ref 78.0–100.0)
PLATELETS: 82 10*3/uL — AB (ref 150–400)
RBC: 4.45 MIL/uL (ref 3.87–5.11)
RDW: 14.1 % (ref 11.5–15.5)
WBC: 12 10*3/uL — AB (ref 4.0–10.5)

## 2014-07-17 LAB — BASIC METABOLIC PANEL
ANION GAP: 14 (ref 5–15)
BUN: 19 mg/dL (ref 6–23)
CHLORIDE: 103 meq/L (ref 96–112)
CO2: 24 mEq/L (ref 19–32)
CREATININE: 0.59 mg/dL (ref 0.50–1.10)
Calcium: 8.7 mg/dL (ref 8.4–10.5)
GFR calc non Af Amer: 82 mL/min — ABNORMAL LOW (ref >90)
Glucose, Bld: 60 mg/dL — ABNORMAL LOW (ref 70–99)
Potassium: 3.6 mEq/L — ABNORMAL LOW (ref 3.7–5.3)
SODIUM: 141 meq/L (ref 137–147)

## 2014-07-17 NOTE — Progress Notes (Signed)
[facility] PROGRESS NOTE  Penny [name] TJQ:[id number] DOB: Feb 27, 1929 DOA: 07/11/2014 PCP: Marton Redwood, MD  Assessment/Plan: 1. Severe sepsis -Secondary to pyelonephritis -Blood cultures growing Morganella and enterococcus species -Enterococcus species susceptible to ampicillin, Morganella resistant to ampicillin, susceptible to ceftriaxone -Amoxicillin added to her regimen, will continue ceftriaxone for now -Repeat blood cultures on 07/16/2014 showing no growth to date.   2. Acute kidney injury -Secondary to postobstructive nephropathy -CT scan showing bilateral proximal ureteral calculi with left sided hydronephrosis -Resolved  3. Bilateral proximal ureteral calculi -Urology following -Dr Diona Fanti recommending to wait 2 weeks for resolution of infection, at which point could do bilateral ureteroscopy for stone extraction - Status post percutaneous nephrostomy performed at 07/12/2014 -Urine culture positive for Morganella -Amoxicillin added to regimen  4. Hypertension -Continue metoprolol 12.5 mg by mouth twice a day. A pressure stable with last blood pressure 121/63  5. Deconditioning -Consult physical therapy  Code Status: Full Code Family Communication: Spoke with daughter present at bedside Disposition Plan: Family wishing to take her home when medically stable   Consultants:  Urology  PCCM  Procedures: LINES/TUBES:  ETT 8/24 >> 8/25  R radial art line 8/24 >> 8/26  R IJ CVL 8/24 >> 8/27 Percutaneous nephrostomy placed on a 24 2015  Antibiotics:  Ceftriaxone 1 g IV q. 24 hour  Amoxicillin  HPI/Subjective: Patient was assisted out of bed to chair. She is awake alert and following commands  Objective: Filed Vitals:   07/17/14 1500  BP: 148/69  Pulse: 67  Temp: 98.9 F (37.2 C)  Resp: 16    Intake/Output Summary (Last 24 hours) at 07/17/14 1538 Last data filed at 07/17/14 1500  Gross per 24 hour  Intake    270 ml  Output    965 ml   Net   -695 ml   Filed Weights   07/15/14 0700 07/16/14 0423 07/17/14 0556  Weight: 68.7 kg (151 lb 7.3 oz) 68.6 kg (151 lb 3.8 oz) 67.6 kg (149 lb 0.5 oz)    Exam:   General:  Patient is in no acute distress, she was helped out of bed to bedside chair  Cardiovascular: Regular rate and rhythm normal S1-S2  Respiratory: Normal respiratory effort, having a few bibasal crackles  Abdomen: Soft nontender nondistended  Musculoskeletal: No edema  Data Reviewed: Basic Metabolic Panel:  Recent Labs Lab 07/11/14 2237 07/12/14 0347 07/12/14 0848 07/13/14 0500 07/14/14 0350 07/15/14 0500 07/15/14 0545 07/17/14 0630  NA 144 143 139 141 140  --  141 141  K 3.2* 3.0* 3.3* 3.7 3.6*  --  3.8 3.6*  CL 110 117* 112 103 102  --  102 103  CO2 16* 13* 11* 22 27  --  32 24  GLUCOSE 94 121* 236* 127* 94  --  110* 60*  BUN 19 19 19  24* 24*  --  20 19  CREATININE 1.28* 1.12* 1.15* 1.03 0.68  --  0.65 0.59  CALCIUM 8.5 6.6* 6.5* 7.5* 7.3*  --  8.0* 8.7  MG  --  1.3*  --  1.4*  --  2.0  --   --   PHOS  --  2.7  --  2.2*  --   --   --   --    Liver Function Tests:  Recent Labs Lab 07/11/14 2237 07/12/14 0848 07/13/14 0500 07/14/14 0350  AST 48* 55* 44* 38*  ALT 12 17 18 18   ALKPHOS 71 42 59 84  BILITOT 1.6* 1.4* 1.4*  0.9  PROT 4.8* 4.4* 5.3* 4.9*  ALBUMIN 2.2* 2.0* 2.3* 2.1*   No results found for this basename: LIPASE, AMYLASE,  in the last 168 hours No results found for this basename: AMMONIA,  in the last 168 hours CBC:  Recent Labs Lab 07/11/14 2237  07/12/14 0848 07/13/14 0500 07/14/14 0350 07/15/14 0545 07/17/14 0630  WBC 5.4  < > 21.9* 22.1* 15.7* 15.7* 12.0*  NEUTROABS 4.9  --   --   --   --   --   --   HGB 12.3  < > 13.2 12.0 11.0* 11.3* 13.0  HCT 38.0  < > 40.8 35.5* 33.2* 34.4* 38.6  MCV 90.9  < > 91.3 86.8 88.3 90.5 86.7  PLT 95*  < > 110* 90*  92* 61* 61* 82*  < > = values in this interval not displayed. Cardiac Enzymes:  Recent Labs Lab  07/12/14 0830  TROPONINI <0.30   BNP (last 3 results) No results found for this basename: PROBNP,  in the last 8760 hours CBG:  Recent Labs Lab 07/14/14 0341 07/14/14 0805 07/14/14 1235 07/14/14 1549 07/15/14 2152  GLUCAP 78 87 124* 99 76    Recent Results (from the past 240 hour(s))  URINE CULTURE     Status: None   Collection Time    07/11/14 10:17 PM      Result Value Ref Range Status   Specimen Description URINE, CATHETERIZED   Final   Special Requests NONE   Final   Culture  Setup Time     Final   Value: 07/12/2014 01:52     Performed at [place]     Final   Value: >=100,000 COLONIES/ML     Performed at [facility]   Culture     Final   Value: [facility] MORGANII     Performed at [facility]   Report Status 07/13/2014 FINAL   Final   Organism ID, Bacteria MORGANELLA MORGANII   Final  CULTURE, BLOOD (ROUTINE X 2)     Status: None   Collection Time    07/11/14 10:37 PM      Result Value Ref Range Status   Specimen Description BLOOD LEFT ARM   Final   Special Requests BOTTLES DRAWN AEROBIC AND ANAEROBIC [facility] EACH   Final   Culture  Setup Time     Final   Value: 07/12/2014 01:48     Performed at [facility]   Culture     Final   Value: [facility] MORGANII     Note: Gram Stain Report Called to,Read Back By and Verified With: TERESA CRITE@1450  ON [id number] BY [facility]     Performed at [facility]   Report Status 07/14/2014 FINAL   Final   Organism ID, Bacteria MORGANELLA MORGANII   Final  MRSA PCR SCREENING     Status: None   Collection Time    07/12/14  4:00 AM      Result Value Ref Range Status   MRSA by PCR NEGATIVE  NEGATIVE Final   Comment:            The GeneXpert MRSA Assay (FDA     approved for NASAL specimens     only), is one component of a     comprehensive MRSA colonization     surveillance program. It is not     intended to diagnose MRSA     infection nor to guide or  monitor  treatment for     MRSA infections.  CULTURE, BLOOD (ROUTINE X 2)     Status: None   Collection Time    07/12/14  4:45 AM      Result Value Ref Range Status   Specimen Description BLOOD RIGHT HAND   Final   Special Requests BOTTLES DRAWN AEROBIC ONLY 3CC   Final   Culture  Setup Time     Final   Value: 07/12/2014 08:41     Performed at [facility]   Culture     Final   Value:        BLOOD CULTURE RECEIVED NO GROWTH TO DATE CULTURE WILL BE HELD FOR 5 DAYS BEFORE ISSUING A FINAL NEGATIVE REPORT     Performed at [facility]   Report Status PENDING   Incomplete  CULTURE, RESPIRATORY (NON-EXPECTORATED)     Status: None   Collection Time    07/12/14  8:06 AM      Result Value Ref Range Status   Specimen Description TRACHEAL ASPIRATE   Final   Special Requests NONE   Final   Gram Stain     Final   Value: RARE WBC PRESENT, PREDOMINANTLY MONONUCLEAR     NO SQUAMOUS EPITHELIAL CELLS SEEN     NO ORGANISMS SEEN     Performed at [facility]   Culture     Final   Value: Non-Pathogenic Oropharyngeal-type Flora Isolated.     Performed at [facility]   Report Status 07/14/2014 FINAL   Final  URINE CULTURE     Status: None   Collection Time    07/12/14 10:20 AM      Result Value Ref Range Status   Specimen Description URINE, CATHETERIZED   Final   Special Requests NONE   Final   Culture  Setup Time     Final   Value: 07/12/2014 11:06     Performed at [place]     Final   Value: NO GROWTH     Performed at [facility]   Culture     Final   Value: NO GROWTH     Performed at [facility]   Report Status 07/13/2014 FINAL   Final  CULTURE, ROUTINE-ABSCESS     Status: None   Collection Time    07/12/14  1:30 PM      Result Value Ref Range Status   Specimen Description ABSCESS BLADDER   Final   Special Requests NONE   Final   Gram Stain     Final   Value: MODERATE WBC PRESENT, PREDOMINANTLY PMN     NO SQUAMOUS  EPITHELIAL CELLS SEEN     NO ORGANISMS SEEN     Performed at [facility]   Culture     Final   Value: MODERATE MORGANELLA MORGANII     ABUNDANT ENTEROCOCCUS SPECIES     Performed at [facility]   Report Status 07/16/2014 FINAL   Final   Organism ID, Bacteria MORGANELLA MORGANII   Final   Organism ID, Bacteria ENTEROCOCCUS SPECIES   Final  CULTURE, BLOOD (ROUTINE X 2)     Status: None   Collection Time    07/16/14  3:00 PM      Result Value Ref Range Status   Specimen Description BLOOD RIGHT HAND   Final   Special Requests BOTTLES DRAWN AEROBIC ONLY 5CC   Final   Culture  Setup Time     Final   Value: 07/16/2014 19:17     Performed at [facility]   Culture     Final   Value:        BLOOD CULTURE RECEIVED NO GROWTH TO DATE CULTURE WILL BE HELD FOR 5 DAYS BEFORE ISSUING A FINAL NEGATIVE REPORT     Performed at [facility]   Report Status PENDING   Incomplete  CULTURE, BLOOD (ROUTINE X 2)     Status: None   Collection Time    07/16/14  3:15 PM      Result Value Ref Range Status   Specimen Description BLOOD RIGHT HAND   Final   Special Requests BOTTLES DRAWN AEROBIC ONLY 3CC   Final   Culture  Setup Time     Final   Value: 07/16/2014 19:17     Performed at [facility]   Culture     Final   Value:        BLOOD CULTURE RECEIVED NO GROWTH TO DATE CULTURE WILL BE HELD FOR 5 DAYS BEFORE ISSUING A FINAL NEGATIVE REPORT     Performed at [facility]   Report Status PENDING   Incomplete     Studies: No results found.  Scheduled Meds: . amoxicillin  500 mg Oral Q12H  . cefTRIAXone (ROCEPHIN)  IV  1 g Intravenous Q24H  . chlorhexidine  15 mL Mouth Rinse BID  . digoxin  0.125 mg Oral Once per day on Mon Wed Fri  . donepezil  10 mg Oral QHS  . feeding supplement (RESOURCE BREEZE)  1 Container Oral TID BM  . fluconazole (DIFLUCAN) IV  100 mg Intravenous Q24H  . metoprolol tartrate  12.5 mg Oral BID  . mirtazapine  15 mg Oral QHS  .  rivaroxaban  20 mg Oral Q supper   Continuous Infusions: . sodium chloride Stopped (07/15/14 1330)    Active Problems:   Septic shock   Acute respiratory failure   Hypokalemia   Pyelonephritis   Bacteremia due to Gram-negative bacteria   Severe sepsis(995.92)    Time spent: 35 min    Kelvin Cellar  [facility] Pager [phone]. If 7PM-7AM, please contact night-coverage at [email or web address], password [facility] 07/17/2014, 3:38 PM  LOS: 6 days

## 2014-07-17 NOTE — Progress Notes (Signed)
Subjective: Patient reports no right flank were new urologic complaint  Objective: Vital signs in last 24 hours: Temp:  [97.7 F (36.5 C)-98.6 F (37 C)] 98.6 F (37 C) (08/29 0556) Pulse Rate:  [72-86] 84 (08/29 0556) Resp:  [16-18] 16 (08/29 0556) BP: (117-130)/(63-79) 130/79 mmHg (08/29 0556) SpO2:  [95 %-98 %] 95 % (08/29 0556) Weight:  [67.6 kg (149 lb 0.5 oz)] 67.6 kg (149 lb 0.5 oz) (08/29 0556)  Intake/Output from previous day: 08/28 0701 - 08/29 0700 In: 700 [P.O.:660] Out: 705 [Urine:705] Intake/Output this shift:    Physical Exam:  Percutaneous nephrostomy tube dressing is intact with the tube draining clear urine.  Lab Results:  Recent Labs  07/15/14 0545 07/17/14 0630  HGB 11.3* 13.0  HCT 34.4* 38.6   BMET  Recent Labs  07/15/14 0545 07/17/14 0630  NA 141 141  K 3.8 3.6*  CL 102 103  CO2 32 24  GLUCOSE 110* 60*  BUN 20 19  CREATININE 0.65 0.59  CALCIUM 8.0* 8.7    Recent Labs  07/15/14 0545  INR 1.24   No results found for this basename: LABURIN,  in the last 72 hours Results for orders placed during the hospital encounter of 07/11/14  URINE CULTURE     Status: None   Collection Time    07/11/14 10:17 PM      Result Value Ref Range Status   Specimen Description URINE, CATHETERIZED   Final   Special Requests NONE   Final   Culture  Setup Time     Final   Value: 07/12/2014 01:52     Performed at [place]     Final   Value: >=100,000 COLONIES/ML     Performed at [facility]   Culture     Final   Value: [facility] MORGANII     Performed at [facility]   Report Status 07/13/2014 FINAL   Final   Organism ID, Bacteria MORGANELLA MORGANII   Final  CULTURE, BLOOD (ROUTINE X 2)     Status: None   Collection Time    07/11/14 10:37 PM      Result Value Ref Range Status   Specimen Description BLOOD LEFT ARM   Final   Special Requests BOTTLES DRAWN AEROBIC AND ANAEROBIC [facility]   Final   Culture  Setup Time     Final   Value: 07/12/2014 01:48     Performed at [facility]   Culture     Final   Value: [facility] MORGANII     Note: Gram Stain Report Called to,Read Back By and Verified With: TERESA CRITE@1450  ON [id number] BY [facility]     Performed at [facility]   Report Status 07/14/2014 FINAL   Final   Organism ID, Bacteria MORGANELLA MORGANII   Final  MRSA PCR SCREENING     Status: None   Collection Time    07/12/14  4:00 AM      Result Value Ref Range Status   MRSA by PCR NEGATIVE  NEGATIVE Final   Comment:            The GeneXpert MRSA Assay (FDA     approved for NASAL specimens     only), is one component of a     comprehensive MRSA colonization     surveillance program. It is not     intended to diagnose MRSA     infection nor to guide or  monitor treatment for     MRSA infections.  CULTURE, BLOOD (ROUTINE X 2)     Status: None   Collection Time    07/12/14  4:45 AM      Result Value Ref Range Status   Specimen Description BLOOD RIGHT HAND   Final   Special Requests BOTTLES DRAWN AEROBIC ONLY 3CC   Final   Culture  Setup Time     Final   Value: 07/12/2014 08:41     Performed at [facility]   Culture     Final   Value:        BLOOD CULTURE RECEIVED NO GROWTH TO DATE CULTURE WILL BE HELD FOR 5 DAYS BEFORE ISSUING A FINAL NEGATIVE REPORT     Performed at [facility]   Report Status PENDING   Incomplete  CULTURE, RESPIRATORY (NON-EXPECTORATED)     Status: None   Collection Time    07/12/14  8:06 AM      Result Value Ref Range Status   Specimen Description TRACHEAL ASPIRATE   Final   Special Requests NONE   Final   Gram Stain     Final   Value: RARE WBC PRESENT, PREDOMINANTLY MONONUCLEAR     NO SQUAMOUS EPITHELIAL CELLS SEEN     NO ORGANISMS SEEN     Performed at [facility]   Culture     Final   Value: Non-Pathogenic Oropharyngeal-type Flora Isolated.     Performed at [facility]   Report Status  07/14/2014 FINAL   Final  URINE CULTURE     Status: None   Collection Time    07/12/14 10:20 AM      Result Value Ref Range Status   Specimen Description URINE, CATHETERIZED   Final   Special Requests NONE   Final   Culture  Setup Time     Final   Value: 07/12/2014 11:06     Performed at [facility]     Final   Value: NO GROWTH     Performed at [facility]   Culture     Final   Value: NO GROWTH     Performed at [facility]   Report Status 07/13/2014 FINAL   Final  CULTURE, ROUTINE-ABSCESS     Status: None   Collection Time    07/12/14  1:30 PM      Result Value Ref Range Status   Specimen Description ABSCESS BLADDER   Final   Special Requests NONE   Final   Gram Stain     Final   Value: MODERATE WBC PRESENT, PREDOMINANTLY PMN     NO SQUAMOUS EPITHELIAL CELLS SEEN     NO ORGANISMS SEEN     Performed at [facility]   Culture     Final   Value: MODERATE MORGANELLA MORGANII     ABUNDANT ENTEROCOCCUS SPECIES     Performed at [facility]   Report Status 07/16/2014 FINAL   Final   Organism ID, Bacteria MORGANELLA MORGANII   Final   Organism ID, Bacteria ENTEROCOCCUS SPECIES   Final    Studies/Results: No results found.  Assessment/Plan: She is doing well with no right flank pain, normal creatinine and improvement in her white blood cell count. Her hemoglobin remained stable. Her nephrostomy tube is draining and she is tolerating the tube.  No new urologic recommendations.   LOS: 6 days   Penny [name] C 07/17/2014, 8:12  AM    

## 2014-07-17 NOTE — Progress Notes (Signed)
Subjective: Patient without complaints.   Objective: Physical Exam: BP 130/79  Pulse 84  Temp(Src) 98.6 F (37 C) (Oral)  Resp 16  Ht 5' 4.17" (1.63 m)  Wt 149 lb 0.5 oz (67.6 kg)  BMI 25.44 kg/m2  SpO2 95%  General: NAD Abd: Soft, NT, ND, left PCN dressing C/D/I, NT, no signs of hematoma or bleeding, 24 hr output 505 cc (500 cc) clear urine  Labs: CBC  Recent Labs  07/15/14 0545 07/17/14 0630  WBC 15.7* 12.0*  HGB 11.3* 13.0  HCT 34.4* 38.6  PLT 61* 82*   BMET  Recent Labs  07/15/14 0545 07/17/14 0630  NA 141 141  K 3.8 3.6*  CL 102 103  CO2 32 24  GLUCOSE 110* 60*  BUN 20 19  CREATININE 0.65 0.59  CALCIUM 8.0* 8.7   LFT No results found for this basename: PROT, ALBUMIN, AST, ALT, ALKPHOS, BILITOT, BILIDIR, IBILI, LIPASE,  in the last 72 hours PT/INR  Recent Labs  07/15/14 0545  LABPROT 15.6*  INR 1.24    Studies/Results: No results found.  Assessment/Plan: [name], wbc trending down 12 (15.7), afebrile, Cx Morganella Morganii Left Hydronephrosis secondary to ureteral calculi S/p left PCN 8/24, good output, renal function improving Plans per Urology possible bilateral ureteroscopy and management of bilateral stones in 2 weeks.    LOS: 6 days    Rockney Ghee 07/17/2014 11:27 AM

## 2014-07-17 NOTE — Progress Notes (Signed)
Physical Therapy Treatment Patient Details Name: Penny [name] MRN: [id number] DOB: 03-11-1929 Today's Date: 07/17/2014    History of Present Illness The patient is an 80 years old female patient of Dr Dahlstedt's who was admitted on 8/23 with urosepsis, septic shock. She was admitted on 8/24 with mental status change and temperature of 104.  She was found on CT scan to have bilateral proximal ureteral calculi with left hydronephrosis.  She is known to have bilateral duplicated systems.  The right ureteral stone is in the lower pole moiety of the right kidney and in the upper moiety of the left kidney.  There is minimal dilation of the right collecting system. A left percutaneous nephrostomy was done on 8/24. Urine culture from the left kidney was positive for Morganella morganii.  She is currently on Rocephin. There is a history of dementia.  She had a right PCNL in July 2014 for a 12x7 right proximal/mid ureteral stone. Her general condition has improved.    PT Comments    Pt admitted with above. Pt currently with functional limitations due to continued weakness as well as balance and endurance deficits.  Performed education with pt and husband with husband struggling to assist pt at times.  When PT pointed out that this was going to be difficult at home, husband states that the NH did nothing for her when she went before and he could do more at home.  Pointed out that at present time pt is so weak that there is a huge risk of pt falling even with him and the RW.  Husband states that daughter, granddaughter and even sister are close by.  Given pts weakness and husband's dedication, feel that a Rehab stay would be beneficial.  Pt and husband state that they would like to at least try and see if she could get approved for it.    Pt will benefit from skilled PT to increase their independence and safety with mobility to allow discharge to the venue listed below.   Follow Up Recommendations   CIR;Supervision/Assistance - 24 hour     Equipment Recommendations  Rolling walker with 5" wheels;Wheelchair cushion (measurements PT);Wheelchair (measurements PT) (check w/ husband, he may have access to them)    Recommendations for Other Services Rehab consult     Precautions / Restrictions Precautions Precautions: Fall Precaution Comments: hx of dementia Restrictions Weight Bearing Restrictions: No    Mobility  Bed Mobility               General bed mobility comments: Pt in recliner when PT arrived.   Transfers Overall transfer level: Needs assistance Equipment used: Rolling walker (2 wheeled) Transfers: Sit to/from Stand Sit to Stand: Mod assist;+2 physical assistance         General transfer comment: Pt requires mod A for forward weight shift and to activate hips into extension.  Max cues for hand placement for safety. Pt used potty as well and husband held onto pt to stand but pt nor husband could clean pt so PT did so.    Ambulation/Gait Ambulation/Gait assistance: Mod assist;+2 physical assistance Ambulation Distance (Feet): 45 Feet (25 feet then 20 feet) Assistive device: Rolling walker (2 wheeled) Gait Pattern/deviations: Step-to pattern;Decreased stance time - right;Decreased stance time - left;Decreased stride length;Shuffle;Trunk flexed;Wide base of support Gait velocity: decreased Gait velocity interpretation: Below normal speed for age/gender General Gait Details: Pt with very poor balance initially and needed steadying assist for stability with RW.  Had husband practice guarding  pt with gait belt as he said he has one at home.  Husband seemed to struggle to assist pt.  Pt Needs constant cues to stand upright with RW and actually as she fatigued, almost flexed at 90 degree angle pushing the walker in front of her.  Husband did cue her but PT was still assisting pt with moving RW and with stability with turns and to back up to chair as well.  Husband states  that pt is weaker but he can do it at home.   Stairs            Wheelchair Mobility    Modified Rankin (Stroke Patients Only)       Balance Overall balance assessment: Needs assistance;History of Falls       Postural control: Posterior lean Standing balance support: Bilateral upper extremity supported;During functional activity Standing balance-Leahy Scale: Poor Standing balance comment: Needs constant hands on for balance with RW.                     Cognition Arousal/Alertness: Lethargic Behavior During Therapy: WFL for tasks assessed/performed Overall Cognitive Status: History of cognitive impairments - at baseline       Memory: Decreased short-term memory              Exercises      General Comments General comments (skin integrity, edema, etc.): drain left side      Pertinent Vitals/Pain Pain Assessment: No/denies pain VSS    Home Living                      Prior Function            PT Goals (current goals can now be found in the care plan section) Progress towards PT goals: Progressing toward goals    Frequency  Min 3X/week    PT Plan Discharge plan needs to be updated    Co-evaluation             End of Session Equipment Utilized During Treatment: Gait belt Activity Tolerance: Patient limited by fatigue Patient left: in chair;with call bell/phone within reach;with family/visitor present     Time: 9357-0177 PT Time Calculation (min): 29 min  Charges:  $Gait Training: 8-22 mins $Self Care/Home Management: 8-22                    G Codes:      INGOLD,Oather [place] 31-Jul-2014, 4:40 PM [facility] [phone] (pager)

## 2014-07-18 DIAGNOSIS — F0390 Unspecified dementia without behavioral disturbance: Secondary | ICD-10-CM

## 2014-07-18 LAB — CULTURE, BLOOD (ROUTINE X 2): Culture: NO GROWTH

## 2014-07-18 LAB — GLUCOSE, CAPILLARY: GLUCOSE-CAPILLARY: 116 mg/dL — AB (ref 70–99)

## 2014-07-18 MED ORDER — ACYCLOVIR 5 % EX CREA
TOPICAL_CREAM | CUTANEOUS | Status: DC
  Filled 2014-07-18: qty 5

## 2014-07-18 MED ORDER — ACYCLOVIR 5 % EX OINT
TOPICAL_OINTMENT | Freq: Four times a day (QID) | CUTANEOUS | Status: DC
  Administered 2014-07-18 (×2): via TOPICAL
  Administered 2014-07-18: 1 via TOPICAL
  Administered 2014-07-18 – 2014-07-19 (×2): via TOPICAL
  Administered 2014-07-19 (×2): 1 via TOPICAL
  Administered 2014-07-19 – 2014-07-20 (×5): via TOPICAL
  Administered 2014-07-21: 1 via TOPICAL
  Filled 2014-07-18: qty 30

## 2014-07-18 NOTE — Progress Notes (Signed)
[facility] PROGRESS NOTE  Penny [name] HTD:[id number] DOB: [birth year] DOA: 07/11/2014 PCP: Marton Redwood, MD  Assessment/Plan: 1. Severe sepsis -Secondary to pyelonephritis -Blood cultures growing Morganella and enterococcus species -Enterococcus species susceptible to ampicillin, Morganella resistant to ampicillin, susceptible to ceftriaxone -Amoxicillin added to her regimen, will continue ceftriaxone for now -Repeat blood cultures on 07/16/2014 showing no growth to date.   2. Acute kidney injury -Secondary to postobstructive nephropathy -CT scan showing bilateral proximal ureteral calculi with left sided hydronephrosis -Resolved  3. Bilateral proximal ureteral calculi -Urology following -Dr Diona Fanti recommending to wait 2 weeks for resolution of infection, at which point could do bilateral ureteroscopy for stone extraction - Status post percutaneous nephrostomy performed at 07/12/2014 -Urine culture positive for Morganella -Amoxicillin added to regimen  4. Hypertension -Continue metoprolol 12.5 mg by mouth twice a day. A pressure stable with last blood pressure 121/63  5. Deconditioning -We ambulated patient down the hall way to the nurses station and back. Has unsteady gait  Code Status: Full Code Family Communication: Spoke with husband present at bedside Disposition Plan: Family wishing to take her home when medically stable   Consultants:  Urology  PCCM  Procedures: LINES/TUBES:  ETT 8/24 >> 8/25  R radial art line 8/24 >> 8/26  R IJ CVL 8/24 >> 8/27 Percutaneous nephrostomy placed on a 24 2015  Antibiotics:  Ceftriaxone 1 g IV q. 24 hour  Amoxicillin  HPI/Subjective: Patient tolerating PO intake, she was ambulated down the hall way today. Seems less sedated.   Objective: Filed Vitals:   07/18/14 0609  BP: 129/75  Pulse: 80  Temp: 98.2 F (36.8 C)  Resp: 17    Intake/Output Summary (Last 24 hours) at 07/18/14 1338 Last data filed at  07/18/14 1300  Gross per 24 hour  Intake    650 ml  Output    915 ml  Net   -265 ml   Filed Weights   07/16/14 0423 07/17/14 0556 07/18/14 0609  Weight: 68.6 kg (151 lb 3.8 oz) 67.6 kg (149 lb 0.5 oz) 63.912 kg (140 lb 14.4 oz)    Exam:   General:  Patient is in no acute distress, seems less sedated  Cardiovascular: Regular rate and rhythm normal S1-S2  Respiratory: Normal respiratory effort, having a few bibasal crackles  Abdomen: Soft nontender nondistended  Musculoskeletal: No edema  Data Reviewed: Basic Metabolic Panel:  Recent Labs Lab 07/11/14 2237 07/12/14 0347 07/12/14 0848 07/13/14 0500 07/14/14 0350 07/15/14 0500 07/15/14 0545 07/17/14 0630  NA 144 143 139 141 140  --  141 141  K 3.2* 3.0* 3.3* 3.7 3.6*  --  3.8 3.6*  CL 110 117* 112 103 102  --  102 103  CO2 16* 13* 11* 22 27  --  32 24  GLUCOSE 94 121* 236* 127* 94  --  110* 60*  BUN 19 19 19  24* 24*  --  20 19  CREATININE 1.28* 1.12* 1.15* 1.03 0.68  --  0.65 0.59  CALCIUM 8.5 6.6* 6.5* 7.5* 7.3*  --  8.0* 8.7  MG  --  1.3*  --  1.4*  --  2.0  --   --   PHOS  --  2.7  --  2.2*  --   --   --   --    Liver Function Tests:  Recent Labs Lab 07/11/14 2237 07/12/14 0848 07/13/14 0500 07/14/14 0350  AST 48* 55* 44* 38*  ALT 12 17 18 18   ALKPHOS 71  42 59 84  BILITOT 1.6* 1.4* 1.4* 0.9  PROT 4.8* 4.4* 5.3* 4.9*  ALBUMIN 2.2* 2.0* 2.3* 2.1*   No results found for this basename: LIPASE, AMYLASE,  in the last 168 hours No results found for this basename: AMMONIA,  in the last 168 hours CBC:  Recent Labs Lab 07/11/14 2237  07/12/14 0848 07/13/14 0500 07/14/14 0350 07/15/14 0545 07/17/14 0630  WBC 5.4  < > 21.9* 22.1* 15.7* 15.7* 12.0*  NEUTROABS 4.9  --   --   --   --   --   --   HGB 12.3  < > 13.2 12.0 11.0* 11.3* 13.0  HCT 38.0  < > 40.8 35.5* 33.2* 34.4* 38.6  MCV 90.9  < > 91.3 86.8 88.3 90.5 86.7  PLT 95*  < > 110* 90*  92* 61* 61* 82*  < > = values in this interval not  displayed. Cardiac Enzymes:  Recent Labs Lab 07/12/14 0830  TROPONINI <0.30   BNP (last 3 results) No results found for this basename: PROBNP,  in the last 8760 hours CBG:  Recent Labs Lab 07/14/14 0341 07/14/14 0805 07/14/14 1235 07/14/14 1549 07/15/14 2152  GLUCAP 78 87 124* 99 76    Recent Results (from the past 240 hour(s))  URINE CULTURE     Status: None   Collection Time    07/11/14 10:17 PM      Result Value Ref Range Status   Specimen Description URINE, CATHETERIZED   Final   Special Requests NONE   Final   Culture  Setup Time     Final   Value: 07/12/2014 01:52     Performed at [place]     Final   Value: >=100,000 COLONIES/ML     Performed at [facility]   Culture     Final   Value: [facility] MORGANII     Performed at [facility]   Report Status 07/13/2014 FINAL   Final   Organism ID, Bacteria MORGANELLA MORGANII   Final  CULTURE, BLOOD (ROUTINE X 2)     Status: None   Collection Time    07/11/14 10:37 PM      Result Value Ref Range Status   Specimen Description BLOOD LEFT ARM   Final   Special Requests BOTTLES DRAWN AEROBIC AND ANAEROBIC [facility] EACH   Final   Culture  Setup Time     Final   Value: 07/12/2014 01:48     Performed at [facility]   Culture     Final   Value: [facility] MORGANII     Note: Gram Stain Report Called to,Read Back By and Verified With: TERESA CRITE@1450  ON [id number] BY [facility]     Performed at [facility]   Report Status 07/14/2014 FINAL   Final   Organism ID, Bacteria MORGANELLA MORGANII   Final  MRSA PCR SCREENING     Status: None   Collection Time    07/12/14  4:00 AM      Result Value Ref Range Status   MRSA by PCR NEGATIVE  NEGATIVE Final   Comment:            The GeneXpert MRSA Assay (FDA     approved for NASAL specimens     only), is one component of a     comprehensive MRSA colonization     surveillance program. It is not     intended to diagnose MRSA  infection nor to guide or     monitor treatment for     MRSA infections.  CULTURE, BLOOD (ROUTINE X 2)     Status: None   Collection Time    07/12/14  4:45 AM      Result Value Ref Range Status   Specimen Description BLOOD RIGHT HAND   Final   Special Requests BOTTLES DRAWN AEROBIC ONLY 3CC   Final   Culture  Setup Time     Final   Value: 07/12/2014 08:41     Performed at [facility]   Culture     Final   Value: NO GROWTH 5 DAYS     Performed at [facility]   Report Status 07/18/2014 FINAL   Final  CULTURE, RESPIRATORY (NON-EXPECTORATED)     Status: None   Collection Time    07/12/14  8:06 AM      Result Value Ref Range Status   Specimen Description TRACHEAL ASPIRATE   Final   Special Requests NONE   Final   Gram Stain     Final   Value: RARE WBC PRESENT, PREDOMINANTLY MONONUCLEAR     NO SQUAMOUS EPITHELIAL CELLS SEEN     NO ORGANISMS SEEN     Performed at [facility]   Culture     Final   Value: Non-Pathogenic Oropharyngeal-type Flora Isolated.     Performed at [facility]   Report Status 07/14/2014 FINAL   Final  URINE CULTURE     Status: None   Collection Time    07/12/14 10:20 AM      Result Value Ref Range Status   Specimen Description URINE, CATHETERIZED   Final   Special Requests NONE   Final   Culture  Setup Time     Final   Value: 07/12/2014 11:06     Performed at [place]     Final   Value: NO GROWTH     Performed at [facility]   Culture     Final   Value: NO GROWTH     Performed at [facility]   Report Status 07/13/2014 FINAL   Final  CULTURE, ROUTINE-ABSCESS     Status: None   Collection Time    07/12/14  1:30 PM      Result Value Ref Range Status   Specimen Description ABSCESS BLADDER   Final   Special Requests NONE   Final   Gram Stain     Final   Value: MODERATE WBC PRESENT, PREDOMINANTLY PMN     NO SQUAMOUS EPITHELIAL CELLS SEEN     NO ORGANISMS SEEN      Performed at [facility]   Culture     Final   Value: MODERATE MORGANELLA MORGANII     ABUNDANT ENTEROCOCCUS SPECIES     Performed at [facility]   Report Status 07/16/2014 FINAL   Final   Organism ID, Bacteria MORGANELLA MORGANII   Final   Organism ID, Bacteria ENTEROCOCCUS SPECIES   Final  CULTURE, BLOOD (ROUTINE X 2)     Status: None   Collection Time    07/16/14  3:00 PM      Result Value Ref Range Status   Specimen Description BLOOD RIGHT HAND   Final   Special Requests BOTTLES DRAWN AEROBIC ONLY 5CC   Final   Culture  Setup Time     Final   Value: 07/16/2014 19:17  Performed at [facility]     Final   Value:        BLOOD CULTURE RECEIVED NO GROWTH TO DATE CULTURE WILL BE HELD FOR 5 DAYS BEFORE ISSUING A FINAL NEGATIVE REPORT     Performed at [facility]   Report Status PENDING   Incomplete  CULTURE, BLOOD (ROUTINE X 2)     Status: None   Collection Time    07/16/14  3:15 PM      Result Value Ref Range Status   Specimen Description BLOOD RIGHT HAND   Final   Special Requests BOTTLES DRAWN AEROBIC ONLY 3CC   Final   Culture  Setup Time     Final   Value: 07/16/2014 19:17     Performed at [facility]   Culture     Final   Value:        BLOOD CULTURE RECEIVED NO GROWTH TO DATE CULTURE WILL BE HELD FOR 5 DAYS BEFORE ISSUING A FINAL NEGATIVE REPORT     Performed at [facility]   Report Status PENDING   Incomplete     Studies: No results found.  Scheduled Meds: . acyclovir ointment   Topical QID  . amoxicillin  500 mg Oral Q12H  . cefTRIAXone (ROCEPHIN)  IV  1 g Intravenous Q24H  . chlorhexidine  15 mL Mouth Rinse BID  . digoxin  0.125 mg Oral Once per day on Mon Wed Fri  . donepezil  10 mg Oral QHS  . feeding supplement (RESOURCE BREEZE)  1 Container Oral TID BM  . fluconazole (DIFLUCAN) IV  100 mg Intravenous Q24H  . metoprolol tartrate  12.5 mg Oral BID  . mirtazapine  15 mg Oral QHS  . rivaroxaban   20 mg Oral Q supper   Continuous Infusions: . sodium chloride Stopped (07/15/14 1330)    Active Problems:   Septic shock   Acute respiratory failure   Hypokalemia   Pyelonephritis   Bacteremia due to Gram-negative bacteria   Severe sepsis(995.92)    Time spent: 25 min    Kelvin Cellar  [facility] Pager [phone]. If 7PM-7AM, please contact night-coverage at [email or web address], password [facility] 07/18/2014, 1:38 PM  LOS: 7 days

## 2014-07-18 NOTE — Progress Notes (Signed)
Subjective: Patient reports no new urologic complaints specifically no right flank pain.  Objective: Vital signs in last 24 hours: Temp:  [97.6 F (36.4 C)-98.9 F (37.2 C)] 98.2 F (36.8 C) (08/30 0609) Pulse Rate:  [67-85] 80 (08/30 0609) Resp:  [16-17] 17 (08/30 0609) BP: (129-148)/(69-86) 129/75 mmHg (08/30 0609) SpO2:  [94 %-98 %] 97 % (08/30 0609) Weight:  [63.912 kg (140 lb 14.4 oz)] 63.912 kg (140 lb 14.4 oz) (08/30 0609)  Intake/Output from previous day: 08/29 0701 - 08/30 0700 In: 620 [P.O.:260; I.V.:120; IV Piggyback:200] Out: 765 [Urine:765] Intake/Output this shift: Total I/O In: -  Out: 200 [Urine:200]   Lab Results:  Recent Labs  07/17/14 0630  HGB 13.0  HCT 38.6   BMET  Recent Labs  07/17/14 0630  NA 141  K 3.6*  CL 103  CO2 24  GLUCOSE 60*  BUN 19  CREATININE 0.59  CALCIUM 8.7   No results found for this basename: LABPT, INR,  in the last 72 hours No results found for this basename: LABURIN,  in the last 72 hours Results for orders placed during the hospital encounter of 07/11/14  URINE CULTURE     Status: None   Collection Time    07/11/14 10:17 PM      Result Value Ref Range Status   Specimen Description URINE, CATHETERIZED   Final   Special Requests NONE   Final   Culture  Setup Time     Final   Value: 07/12/2014 01:52     Performed at [place]     Final   Value: >=100,000 COLONIES/ML     Performed at [facility]   Culture     Final   Value: [facility] MORGANII     Performed at [facility]   Report Status 07/13/2014 FINAL   Final   Organism ID, Bacteria MORGANELLA MORGANII   Final  CULTURE, BLOOD (ROUTINE X 2)     Status: None   Collection Time    07/11/14 10:37 PM      Result Value Ref Range Status   Specimen Description BLOOD LEFT ARM   Final   Special Requests BOTTLES DRAWN AEROBIC AND ANAEROBIC [place]   Final   Culture  Setup Time     Final   Value: 07/12/2014 01:48   Performed at [facility]   Culture     Final   Value: [facility] MORGANII     Note: Gram Stain Report Called to,Read Back By and Verified With: TERESA CRITE@1450  ON [id number] BY [facility]     Performed at [facility]   Report Status 07/14/2014 FINAL   Final   Organism ID, Bacteria MORGANELLA MORGANII   Final  MRSA PCR SCREENING     Status: None   Collection Time    07/12/14  4:00 AM      Result Value Ref Range Status   MRSA by PCR NEGATIVE  NEGATIVE Final   Comment:            The GeneXpert MRSA Assay (FDA     approved for NASAL specimens     only), is one component of a     comprehensive MRSA colonization     surveillance program. It is not     intended to diagnose MRSA     infection nor to guide or     monitor treatment for     MRSA infections.  CULTURE, BLOOD (ROUTINE  X 2)     Status: None   Collection Time    07/12/14  4:45 AM      Result Value Ref Range Status   Specimen Description BLOOD RIGHT HAND   Final   Special Requests BOTTLES DRAWN AEROBIC ONLY 3CC   Final   Culture  Setup Time     Final   Value: 07/12/2014 08:41     Performed at [facility]   Culture     Final   Value:        BLOOD CULTURE RECEIVED NO GROWTH TO DATE CULTURE WILL BE HELD FOR 5 DAYS BEFORE ISSUING A FINAL NEGATIVE REPORT     Performed at [facility]   Report Status PENDING   Incomplete  CULTURE, RESPIRATORY (NON-EXPECTORATED)     Status: None   Collection Time    07/12/14  8:06 AM      Result Value Ref Range Status   Specimen Description TRACHEAL ASPIRATE   Final   Special Requests NONE   Final   Gram Stain     Final   Value: RARE WBC PRESENT, PREDOMINANTLY MONONUCLEAR     NO SQUAMOUS EPITHELIAL CELLS SEEN     NO ORGANISMS SEEN     Performed at [facility]   Culture     Final   Value: Non-Pathogenic Oropharyngeal-type Flora Isolated.     Performed at [facility]   Report Status 07/14/2014 FINAL   Final  URINE CULTURE     Status: None    Collection Time    07/12/14 10:20 AM      Result Value Ref Range Status   Specimen Description URINE, CATHETERIZED   Final   Special Requests NONE   Final   Culture  Setup Time     Final   Value: 07/12/2014 11:06     Performed at [place]     Final   Value: NO GROWTH     Performed at [facility]   Culture     Final   Value: NO GROWTH     Performed at [facility]   Report Status 07/13/2014 FINAL   Final  CULTURE, ROUTINE-ABSCESS     Status: None   Collection Time    07/12/14  1:30 PM      Result Value Ref Range Status   Specimen Description ABSCESS BLADDER   Final   Special Requests NONE   Final   Gram Stain     Final   Value: MODERATE WBC PRESENT, PREDOMINANTLY PMN     NO SQUAMOUS EPITHELIAL CELLS SEEN     NO ORGANISMS SEEN     Performed at [facility]   Culture     Final   Value: MODERATE MORGANELLA MORGANII     ABUNDANT ENTEROCOCCUS SPECIES     Performed at [facility]   Report Status 07/16/2014 FINAL   Final   Organism ID, Bacteria MORGANELLA MORGANII   Final   Organism ID, Bacteria ENTEROCOCCUS SPECIES   Final  CULTURE, BLOOD (ROUTINE X 2)     Status: None   Collection Time    07/16/14  3:00 PM      Result Value Ref Range Status   Specimen Description BLOOD RIGHT HAND   Final   Special Requests BOTTLES DRAWN AEROBIC ONLY 5CC   Final   Culture  Setup Time     Final   Value: 07/16/2014 19:17  Performed at [facility]     Final   Value:        BLOOD CULTURE RECEIVED NO GROWTH TO DATE CULTURE WILL BE HELD FOR 5 DAYS BEFORE ISSUING A FINAL NEGATIVE REPORT     Performed at [facility]   Report Status PENDING   Incomplete  CULTURE, BLOOD (ROUTINE X 2)     Status: None   Collection Time    07/16/14  3:15 PM      Result Value Ref Range Status   Specimen Description BLOOD RIGHT HAND   Final   Special Requests BOTTLES DRAWN AEROBIC ONLY 3CC   Final   Culture  Setup Time     Final    Value: 07/16/2014 19:17     Performed at [facility]   Culture     Final   Value:        BLOOD CULTURE RECEIVED NO GROWTH TO DATE CULTURE WILL BE HELD FOR 5 DAYS BEFORE ISSUING A FINAL NEGATIVE REPORT     Performed at [facility]   Report Status PENDING   Incomplete    Studies/Results: No results found.  Assessment/Plan: She continues to do well from a urologic standpoint. She is not having any right flank pain. I did discuss with the patient and her husband as well as daughter the possibility of internalization of her nephrostomy tube with a double-J stent prior to discharge. I told him that I would allow Dr. Diona Fanti to make the final decision about this so that if he wanted to proceed with a percutaneous approach then the percutaneous nephrostomy tube would be left in place upon discharge.  No new urologic recommendations today.   LOS: 7 days   Mckinleigh Schuchart C 07/18/2014, 9:07 AM

## 2014-07-18 NOTE — Progress Notes (Signed)
Remeron is not given tonight per family request. Family would like to hold remeron and other narcotic pain meds tonight as  pt appears  more lethargic for  Family. Will cont to monitor.

## 2014-07-19 DIAGNOSIS — Z7901 Long term (current) use of anticoagulants: Secondary | ICD-10-CM

## 2014-07-19 DIAGNOSIS — N1330 Unspecified hydronephrosis: Secondary | ICD-10-CM

## 2014-07-19 MED ORDER — LACTULOSE 10 GM/15ML PO SOLN
20.0000 g | Freq: Two times a day (BID) | ORAL | Status: DC | PRN
  Filled 2014-07-19: qty 30

## 2014-07-19 MED ORDER — POLYETHYLENE GLYCOL 3350 17 G PO PACK
17.0000 g | PACK | Freq: Every day | ORAL | Status: DC
  Administered 2014-07-19 – 2014-07-21 (×3): 17 g via ORAL
  Filled 2014-07-19 (×3): qty 1

## 2014-07-19 MED ORDER — FLUCONAZOLE 100 MG PO TABS
100.0000 mg | ORAL_TABLET | Freq: Every day | ORAL | Status: DC
  Administered 2014-07-20 – 2014-07-21 (×2): 100 mg via ORAL
  Filled 2014-07-19 (×2): qty 1

## 2014-07-19 MED ORDER — CIPROFLOXACIN HCL 500 MG PO TABS
500.0000 mg | ORAL_TABLET | Freq: Two times a day (BID) | ORAL | Status: DC
  Administered 2014-07-19 – 2014-07-21 (×4): 500 mg via ORAL
  Filled 2014-07-19 (×6): qty 1

## 2014-07-19 MED ORDER — DOCUSATE SODIUM 100 MG PO CAPS
100.0000 mg | ORAL_CAPSULE | Freq: Two times a day (BID) | ORAL | Status: DC
  Administered 2014-07-19 – 2014-07-21 (×5): 100 mg via ORAL
  Filled 2014-07-19 (×6): qty 1

## 2014-07-19 NOTE — Progress Notes (Signed)
This patient is receiving fluconazole by the intravenous route. Based on criteria approved by the Pharmacy and Therapeutics Committee, and the Infectious Disease Division, the antibiotic(s) is / are being converted to equivalent oral dose form(s). These criteria include:  1) Patient being treated for a respiratory tract infection, urinary tract infection, cellulitis, or Clostridium Difficile Associated Diarrhea 2) The patient is not neutropenic and does not exhibit a GI malabsorption state 3) The patient is eating (either orally or per tube) and/or has been taking other orally administered medications for at least 24 hours. 4) The patient is improving clinically (physician assessment and a 24-hour Tmax of 100.5 F  If you have questions about this conversion, please contact the pharmacy department.  Thank you.  Albertina Parr, PharmD.  Clinical Pharmacist Pager [phone]

## 2014-07-19 NOTE — Clinical Social Work Note (Signed)
Per RNCM, patient will DC home with HHPT when medically stable.  Liz Beach MSW, [place], [place], [phone]

## 2014-07-19 NOTE — Progress Notes (Signed)
[facility] PROGRESS NOTE  Penny [name] WPV:[id number] DOB: March 16, 1929 DOA: 07/11/2014 PCP: Marton Redwood, MD  Interim Summary Patient is a pleasant 80 year old female, with a past medical history of cognitive impairment, recurrent urinary tract infections, currently residing at home with her family, who was admitted to the pulmonary critical care medicine service on 07/12/2014. She presented with a steep functional decline, mental status changes, and with fever. In the emergency department she was found to be hypotensive and lethargic, with initial lab work showing a lactate of 7.35. She was in septic shock and started on pressor support. She underwent rapid sequence intubation. Source of infection urinary tract. A CT scan of abdomen revealed obstructing stone in the lower pole on the right and in the upper pole on the left, evidence of hydronephrosis in left kidney. Her anticoagulation was reversed as urology and interventional radiology consulted. On 07/12/2014 patient undergoing successful ultrasound and fluoroscopic guided placement of left sided PCN, without immediate complications. She had been started on broad-spectrum IV antibiotic therapy with vancomycin and ceftaz edema. By 07/14/2014 patient has showed clinical improvement as she was successfully extubated. By this point she had been off of pressor support. She was transferred to the floor on 07/15/2014. Cultures growing enterococcus species and Morganella. Enterococcus species susceptible to ampicillin with Morganella species resistant to ampicillin Augmentin, susceptible to Ciprofloxacin. Patient deconditioned, family wishing to the take her home with home health services. She was transitioned to oral antibiotic therapy on 07/19/2014, anticipate discharge in the next 24-48 hours.  Assessment/Plan: 1. Severe sepsis -Secondary to pyelonephritis -Blood cultures growing Morganella and enterococcus species -Enterococcus species susceptible  to ampicillin, Morganella resistant to ampicillin, sensitive to Cipro -Patient currently amoxicillin and ciprofloxacin -Repeat blood cultures on 07/16/2014 showing no growth to date.   2. Acute kidney injury -Secondary to postobstructive nephropathy -CT scan showing bilateral proximal ureteral calculi with left sided hydronephrosis -Resolved  3. Bilateral proximal ureteral calculi -Urology following -Dr Diona Fanti recommending to wait 2 weeks for resolution of infection, at which point could do bilateral ureteroscopy for stone extraction - Status post percutaneous nephrostomy performed at 07/12/2014  4. Hypertension -Continue metoprolol 12.5 mg by mouth twice a day. A pressure stable with last blood pressure 121/63  5. Deconditioning -Patient did not qualify for CIR, will likely go home with home health services  Code Status: Full Code Family Communication: Spoke with husband present at bedside Disposition Plan: Anticipate discharge in the next 24-48 hours to home with home health services and she remained stable on oral antibiotic therapy   Consultants:  Urology  PCCM  Procedures: LINES/TUBES:  ETT 8/24 >> 8/25  R radial art line 8/24 >> 8/26  R IJ CVL 8/24 >> 8/27 Percutaneous nephrostomy placed on a 24 2015  Antibiotics:  Ciprofloxacin  Amoxicillin  HPI/Subjective: Patient seems improved today, she is more awake and alert, following commands, tolerating by mouth  Objective: Filed Vitals:   07/19/14 1426  BP: 134/76  Pulse: 85  Temp: 97.7 F (36.5 C)  Resp: 18    Intake/Output Summary (Last 24 hours) at 07/19/14 1817 Last data filed at 07/19/14 1806  Gross per 24 hour  Intake    430 ml  Output   1050 ml  Net   -620 ml   Filed Weights   07/17/14 0556 07/18/14 0609 07/19/14 0519  Weight: 67.6 kg (149 lb 0.5 oz) 63.912 kg (140 lb 14.4 oz) 64.864 kg (143 lb)    Exam:   General:  Patient is  in no acute distress, seems less sedated  Cardiovascular:  Regular rate and rhythm normal S1-S2  Respiratory: Normal respiratory effort, having a few bibasal crackles  Abdomen: Soft nontender nondistended  Musculoskeletal: No edema  Data Reviewed: Basic Metabolic Panel:  Recent Labs Lab 07/13/14 0500 07/14/14 0350 07/15/14 0500 07/15/14 0545 07/17/14 0630  NA 141 140  --  141 141  K 3.7 3.6*  --  3.8 3.6*  CL 103 102  --  102 103  CO2 22 27  --  32 24  GLUCOSE 127* 94  --  110* 60*  BUN 24* 24*  --  20 19  CREATININE 1.03 0.68  --  0.65 0.59  CALCIUM 7.5* 7.3*  --  8.0* 8.7  MG 1.4*  --  2.0  --   --   PHOS 2.2*  --   --   --   --    Liver Function Tests:  Recent Labs Lab 07/13/14 0500 07/14/14 0350  AST 44* 38*  ALT 18 18  ALKPHOS 59 84  BILITOT 1.4* 0.9  PROT 5.3* 4.9*  ALBUMIN 2.3* 2.1*   No results found for this basename: LIPASE, AMYLASE,  in the last 168 hours No results found for this basename: AMMONIA,  in the last 168 hours CBC:  Recent Labs Lab 07/13/14 0500 07/14/14 0350 07/15/14 0545 07/17/14 0630  WBC 22.1* 15.7* 15.7* 12.0*  HGB 12.0 11.0* 11.3* 13.0  HCT 35.5* 33.2* 34.4* 38.6  MCV 86.8 88.3 90.5 86.7  PLT 90*  92* 61* 61* 82*   Cardiac Enzymes: No results found for this basename: CKTOTAL, CKMB, CKMBINDEX, TROPONINI,  in the last 168 hours BNP (last 3 results) No results found for this basename: PROBNP,  in the last 8760 hours CBG:  Recent Labs Lab 07/14/14 0805 07/14/14 1235 07/14/14 1549 07/15/14 2152 07/18/14 2115  GLUCAP 87 124* 99 76 116*    Recent Results (from the past 240 hour(s))  URINE CULTURE     Status: None   Collection Time    07/11/14 10:17 PM      Result Value Ref Range Status   Specimen Description URINE, CATHETERIZED   Final   Special Requests NONE   Final   Culture  Setup Time     Final   Value: 07/12/2014 01:52     Performed at [place]     Final   Value: >=100,000 COLONIES/ML     Performed at [facility]   Culture      Final   Value: [facility] MORGANII     Performed at [facility]   Report Status 07/13/2014 FINAL   Final   Organism ID, Bacteria MORGANELLA MORGANII   Final  CULTURE, BLOOD (ROUTINE X 2)     Status: None   Collection Time    07/11/14 10:37 PM      Result Value Ref Range Status   Specimen Description BLOOD LEFT ARM   Final   Special Requests BOTTLES DRAWN AEROBIC AND ANAEROBIC [place]   Final   Culture  Setup Time     Final   Value: 07/12/2014 01:48     Performed at [facility]   Culture     Final   Value: [facility] MORGANII     Note: Gram Stain Report Called to,Read Back By and Verified With: TERESA CRITE@1450  ON [id number] BY [facility]     Performed at [facility]   Report Status 07/14/2014  FINAL   Final   Organism ID, Bacteria MORGANELLA MORGANII   Final  MRSA PCR SCREENING     Status: None   Collection Time    07/12/14  4:00 AM      Result Value Ref Range Status   MRSA by PCR NEGATIVE  NEGATIVE Final   Comment:            The GeneXpert MRSA Assay (FDA     approved for NASAL specimens     only), is one component of a     comprehensive MRSA colonization     surveillance program. It is not     intended to diagnose MRSA     infection nor to guide or     monitor treatment for     MRSA infections.  CULTURE, BLOOD (ROUTINE X 2)     Status: None   Collection Time    07/12/14  4:45 AM      Result Value Ref Range Status   Specimen Description BLOOD RIGHT HAND   Final   Special Requests BOTTLES DRAWN AEROBIC ONLY 3CC   Final   Culture  Setup Time     Final   Value: 07/12/2014 08:41     Performed at [facility]   Culture     Final   Value: NO GROWTH 5 DAYS     Performed at [facility]   Report Status 07/18/2014 FINAL   Final  CULTURE, RESPIRATORY (NON-EXPECTORATED)     Status: None   Collection Time    07/12/14  8:06 AM      Result Value Ref Range Status   Specimen Description TRACHEAL ASPIRATE   Final   Special Requests NONE   Final    Gram Stain     Final   Value: RARE WBC PRESENT, PREDOMINANTLY MONONUCLEAR     NO SQUAMOUS EPITHELIAL CELLS SEEN     NO ORGANISMS SEEN     Performed at [facility]   Culture     Final   Value: Non-Pathogenic Oropharyngeal-type [place] Isolated.     Performed at [facility]   Report Status 07/14/2014 FINAL   Final  URINE CULTURE     Status: None   Collection Time    07/12/14 10:20 AM      Result Value Ref Range Status   Specimen Description URINE, CATHETERIZED   Final   Special Requests NONE   Final   Culture  Setup Time     Final   Value: 07/12/2014 11:06     Performed at [facility]     Final   Value: NO GROWTH     Performed at [facility]   Culture     Final   Value: NO GROWTH     Performed at [facility]   Report Status 07/13/2014 FINAL   Final  CULTURE, ROUTINE-ABSCESS     Status: None   Collection Time    07/12/14  1:30 PM      Result Value Ref Range Status   Specimen Description ABSCESS BLADDER   Final   Special Requests NONE   Final   Gram Stain     Final   Value: MODERATE WBC PRESENT, PREDOMINANTLY PMN     NO SQUAMOUS EPITHELIAL CELLS SEEN     NO ORGANISMS SEEN     Performed at [facility]   Culture     Final   Value: MODERATE  MORGANELLA MORGANII     ABUNDANT ENTEROCOCCUS SPECIES     Performed at [facility]   Report Status 07/16/2014 FINAL   Final   Organism ID, Bacteria MORGANELLA MORGANII   Final   Organism ID, Bacteria ENTEROCOCCUS SPECIES   Final  CULTURE, BLOOD (ROUTINE X 2)     Status: None   Collection Time    07/16/14  3:00 PM      Result Value Ref Range Status   Specimen Description BLOOD RIGHT HAND   Final   Special Requests BOTTLES DRAWN AEROBIC ONLY 5CC   Final   Culture  Setup Time     Final   Value: 07/16/2014 19:17     Performed at [facility]   Culture     Final   Value:        BLOOD CULTURE RECEIVED NO GROWTH TO DATE CULTURE WILL BE HELD FOR 5 DAYS  BEFORE ISSUING A FINAL NEGATIVE REPORT     Performed at [facility]   Report Status PENDING   Incomplete  CULTURE, BLOOD (ROUTINE X 2)     Status: None   Collection Time    07/16/14  3:15 PM      Result Value Ref Range Status   Specimen Description BLOOD RIGHT HAND   Final   Special Requests BOTTLES DRAWN AEROBIC ONLY 3CC   Final   Culture  Setup Time     Final   Value: 07/16/2014 19:17     Performed at [facility]   Culture     Final   Value:        BLOOD CULTURE RECEIVED NO GROWTH TO DATE CULTURE WILL BE HELD FOR 5 DAYS BEFORE ISSUING A FINAL NEGATIVE REPORT     Performed at [facility]   Report Status PENDING   Incomplete     Studies: No results found.  Scheduled Meds: . acyclovir ointment   Topical QID  . amoxicillin  500 mg Oral Q12H  . chlorhexidine  15 mL Mouth Rinse BID  . ciprofloxacin  500 mg Oral BID  . digoxin  0.125 mg Oral Once per day on Mon Wed Fri  . docusate sodium  100 mg Oral BID  . donepezil  10 mg Oral QHS  . feeding supplement (RESOURCE BREEZE)  1 Container Oral TID BM  . [START ON 07/20/2014] fluconazole  100 mg Oral Daily  . metoprolol tartrate  12.5 mg Oral BID  . mirtazapine  15 mg Oral QHS  . polyethylene glycol  17 g Oral Daily  . rivaroxaban  20 mg Oral Q supper   Continuous Infusions: . sodium chloride Stopped (07/15/14 1330)    Active Problems:   Septic shock   Acute respiratory failure   Hypokalemia   Pyelonephritis   Bacteremia due to Gram-negative bacteria   Severe sepsis(995.92)    Time spent: 25 min    Kelvin Cellar  [facility] Pager [phone]. If 7PM-7AM, please contact night-coverage at [email or web address], password [email or web address] 07/19/2014, 6:17 PM  LOS: 8 days

## 2014-07-19 NOTE — Progress Notes (Signed)
Patient was screened by Penny [name] for appropriateness for an Inpatient Acute Rehab consult.  Discussed with Gaylan Gerold of [employer] and she unfortunately  indicates insurance would not give approval for pt's current diagnoses, therefore not recommending IP Rehab consult at this time.  Please call if questions.      [place] Admissions Coordinator Cell [phone] Office [phone]

## 2014-07-19 NOTE — Progress Notes (Signed)
Physical Therapy Treatment Patient Details Name: Penny [name] MRN: [id number] DOB: 1929/08/13 Today's Date: 07/19/2014    History of Present Illness The patient is an 80 years old female patient of Dr Dahlstedt's who was admitted on 8/23 with urosepsis, septic shock. She was admitted on 8/24 with mental status change and temperature of 104.  She was found on CT scan to have bilateral proximal ureteral calculi with left hydronephrosis.  She is known to have bilateral duplicated systems.  The right ureteral stone is in the lower pole moiety of the right kidney and in the upper moiety of the left kidney.  There is minimal dilation of the right collecting system. A left percutaneous nephrostomy was done on 8/24. Urine culture from the left kidney was positive for Morganella morganii.  She is currently on Rocephin. There is a history of dementia.  She had a right PCNL in July 2014 for a 12x7 right proximal/mid ureteral stone. Her general condition has improved.    PT Comments    Patient progressing slowly with mobility. Pt lethargic today and having bowel difficulties. Pt continues to demonstrate balance deficits limiting safe mobility. Concerned about pt's safety at home and husband's ability to care for patient. Will need to do family training next session with husband to improve handling and safety techniques with mobility if pt and husband refuse SNF. Pt recently denied CIR. Discharge plan updated to [facility] SNF. Will continue to follow.   Follow Up Recommendations  SNF;Supervision/Assistance - 24 hour     Equipment Recommendations  Rolling walker with 5" wheels;Wheelchair cushion (measurements PT);Wheelchair (measurements PT)    Recommendations for Other Services OT consult     Precautions / Restrictions Precautions Precautions: Fall Precaution Comments: hx of dementia Restrictions Weight Bearing Restrictions: No    Mobility  Bed Mobility Overal bed mobility: Needs Assistance Bed  Mobility: Rolling;Sidelying to Sit Rolling: Min guard Sidelying to sit: Min assist;HOB elevated       General bed mobility comments: VC for step by step sequencing. Min A to elevate trunk.  Transfers Overall transfer level: Needs assistance Equipment used: Rolling walker (2 wheeled) Transfers: Sit to/from [facility] Sit to Stand: Mod assist Stand pivot transfers: Min assist       General transfer comment: Stood from EOBx2, from [facility] x2. Requires Mod A for anterior translation and for hip extension. VC for hand placement. Max cues for safety. Total A for pericare x2. SPT bed<-->BSC x2 as pt continuing reporting she needs to have a bowel movement.  Pivotal steps towards [facility] x2 with constant cues for technique and safety.   Ambulation/Gait Ambulation/Gait assistance: Min assist Ambulation Distance (Feet): 20 Feet Assistive device: Rolling walker (2 wheeled) Gait Pattern/deviations: Step-to pattern;Decreased stride length;Trunk flexed;Wide base of support Gait velocity: decreased   General Gait Details: Very unsteady during gait requiring Min A for balance and stability with RW. Pt continually getting RW stuck on bed/chairs etc. Pt with increased knee flexion bilaterally worsened when fatigued. Husband cues pt to reach back for chair upon descent.   Stairs            Wheelchair Mobility    Modified Rankin (Stroke Patients Only)       Balance Overall balance assessment: Needs assistance Sitting-balance support: Feet supported Sitting balance-Leahy Scale: Fair       Standing balance-Leahy Scale: Poor Standing balance comment: Requires UE support on RW for balance. Able to stand ~1 minute, ~35 sec, ~30 sec for peri care. Fatigued easily.  Cognition Arousal/Alertness: Lethargic Behavior During Therapy: WFL for tasks assessed/performed Overall Cognitive Status: History of cognitive impairments - at baseline       Memory:  Decreased short-term memory ("what do you want me to do again?" spoken multiple times during treatment.)              Exercises      General Comments General comments (skin integrity, edema, etc.): Drain left side.       Pertinent Vitals/Pain Pain Assessment: No/denies pain    Home Living                      Prior Function            PT Goals (current goals can now be found in the care plan section) Progress towards PT goals: Progressing toward goals    Frequency  Min 3X/week    PT Plan Discharge plan needs to be updated    Co-evaluation             End of Session Equipment Utilized During Treatment: Gait belt Activity Tolerance: Patient limited by fatigue Patient left: in chair;with call bell/phone within reach;with family/visitor present     Time: 1245-1330 PT Time Calculation (min): 45 min  Charges:  $Gait Training: 8-22 mins $Therapeutic Activity: 23-37 mins                    G CodesCandy Sledge A 2014/07/31, 1:53 PM Candy Sledge, [place], DPT [phone]

## 2014-07-19 NOTE — Progress Notes (Signed)
Agree.  Will follow.

## 2014-07-19 NOTE — Progress Notes (Signed)
Subjective: Pt resting comfortably; has only mild soreness at left PCN site; denies rt flank pain  Objective: Vital signs in last 24 hours: Temp:  [97.6 F (36.4 C)-98.9 F (37.2 C)] 98.9 F (37.2 C) (08/31 0519) Pulse Rate:  [77-89] 89 (08/31 0932) Resp:  [16-18] 18 (08/31 0519) BP: (133-141)/(65-78) 135/78 mmHg (08/31 0932) SpO2:  [95 %-97 %] 96 % (08/31 0519) Weight:  [143 lb (64.864 kg)] 143 lb (64.864 kg) (08/31 0519) Last BM Date: 07/16/14  Intake/Output from previous day: 08/30 0701 - 08/31 0700 In: 740 [P.O.:370; I.V.:240; IV Piggyback:100] Out: 1250 [Urine:1250] Intake/Output this shift: Total I/O In: -  Out: 600 [Urine:600]  Left PCN intact, dressing dry; output 450 cc's sl blood tinged urine; creat nl  Lab Results:   Recent Labs  07/17/14 0630  WBC 12.0*  HGB 13.0  HCT 38.6  PLT 82*   BMET  Recent Labs  07/17/14 0630  NA 141  K 3.6*  CL 103  CO2 24  GLUCOSE 60*  BUN 19  CREATININE 0.59  CALCIUM 8.7   PT/INR No results found for this basename: LABPROT, INR,  in the last 72 hours ABG No results found for this basename: PHART, PCO2, PO2, HCO3,  in the last 72 hours  Studies/Results: No results found.  Anti-infectives: Anti-infectives   Start     Dose/Rate Route Frequency Ordered Stop   07/16/14 1500  amoxicillin (AMOXIL) capsule 500 mg     500 mg Oral Every 12 hours 07/16/14 1358     07/16/14 1230  fluconazole (DIFLUCAN) IVPB 100 mg     100 mg 50 mL/hr over 60 Minutes Intravenous Every 24 hours 07/16/14 1131     07/15/14 0930  cefTRIAXone (ROCEPHIN) 1 g in dextrose 5 % 50 mL IVPB  Status:  Discontinued     1 g 100 mL/hr over 30 Minutes Intravenous Every 24 hours 07/14/14 1136 07/15/14 0831   07/15/14 0930  cefTRIAXone (ROCEPHIN) 1 g in dextrose 5 % 50 mL IVPB     1 g 100 mL/hr over 30 Minutes Intravenous Every 24 hours 07/15/14 0831     07/14/14 0800  vancomycin (VANCOCIN) IVPB 1000 mg/200 mL premix  Status:  Discontinued     1,000  mg 200 mL/hr over 60 Minutes Intravenous Every 24 hours 07/13/14 1101 07/13/14 1206   07/13/14 2200  cefTAZidime (FORTAZ) 1 g in dextrose 5 % 50 mL IVPB  Status:  Discontinued     1 g 100 mL/hr over 30 Minutes Intravenous Every 12 hours 07/13/14 1047 07/14/14 1125   07/13/14 1300  ciprofloxacin (CIPRO) IVPB 400 mg  Status:  Discontinued     400 mg 200 mL/hr over 60 Minutes Intravenous Every 12 hours 07/13/14 1206 07/14/14 1124   07/13/14 0800  vancomycin (VANCOCIN) 500 mg in sodium chloride 0.9 % 100 mL IVPB  Status:  Discontinued     500 mg 100 mL/hr over 60 Minutes Intravenous Every 24 hours 07/12/14 0404 07/13/14 1101   07/12/14 0600  cefTAZidime (FORTAZ) 1 g in dextrose 5 % 50 mL IVPB  Status:  Discontinued     1 g 100 mL/hr over 30 Minutes Intravenous Every 24 hours 07/12/14 0404 07/13/14 1047   07/12/14 0145  vancomycin (VANCOCIN) IVPB 1000 mg/200 mL premix  Status:  Discontinued     1,000 mg 200 mL/hr over 60 Minutes Intravenous  Once 07/12/14 0143 07/12/14 0403   07/12/14 0130  vancomycin (VANCOCIN) IVPB 1000 mg/200 mL premix  1,000 mg 200 mL/hr over 60 Minutes Intravenous  Once 07/12/14 0126 07/12/14 0552   07/11/14 2215  cefTRIAXone (ROCEPHIN) 1 g in dextrose 5 % 50 mL IVPB     1 g 100 mL/hr over 30 Minutes Intravenous  Once 07/11/14 2214 07/11/14 2320      Assessment/Plan: s/p left PCN 8/24 secondary to hydro/stones/urosepsis; plans as per urology; cont drain for now  LOS: 8 days    ALLRED,D [facility] 07/19/2014

## 2014-07-20 DIAGNOSIS — R509 Fever, unspecified: Secondary | ICD-10-CM

## 2014-07-20 DIAGNOSIS — E876 Hypokalemia: Secondary | ICD-10-CM

## 2014-07-20 LAB — CBC
HCT: 33.7 % — ABNORMAL LOW (ref 36.0–46.0)
HEMOGLOBIN: 11.1 g/dL — AB (ref 12.0–15.0)
MCH: 29.2 pg (ref 26.0–34.0)
MCHC: 32.9 g/dL (ref 30.0–36.0)
MCV: 88.7 fL (ref 78.0–100.0)
PLATELETS: 173 10*3/uL (ref 150–400)
RBC: 3.8 MIL/uL — ABNORMAL LOW (ref 3.87–5.11)
RDW: 14.1 % (ref 11.5–15.5)
WBC: 7.8 10*3/uL (ref 4.0–10.5)

## 2014-07-20 LAB — BASIC METABOLIC PANEL
ANION GAP: 10 (ref 5–15)
BUN: 12 mg/dL (ref 6–23)
CHLORIDE: 107 meq/L (ref 96–112)
CO2: 25 mEq/L (ref 19–32)
Calcium: 8.6 mg/dL (ref 8.4–10.5)
Creatinine, Ser: 0.57 mg/dL (ref 0.50–1.10)
GFR, EST NON AFRICAN AMERICAN: 83 mL/min — AB (ref >90)
Glucose, Bld: 84 mg/dL (ref 70–99)
POTASSIUM: 2.7 meq/L — AB (ref 3.7–5.3)
Sodium: 142 mEq/L (ref 137–147)

## 2014-07-20 LAB — MAGNESIUM: MAGNESIUM: 1.5 mg/dL (ref 1.5–2.5)

## 2014-07-20 MED ORDER — POTASSIUM CHLORIDE CRYS ER 20 MEQ PO TBCR
60.0000 meq | EXTENDED_RELEASE_TABLET | ORAL | Status: AC
  Administered 2014-07-20 (×2): 60 meq via ORAL
  Filled 2014-07-20 (×2): qty 3

## 2014-07-20 MED ORDER — POTASSIUM CHLORIDE CRYS ER 20 MEQ PO TBCR
30.0000 meq | EXTENDED_RELEASE_TABLET | ORAL | Status: DC
  Filled 2014-07-20 (×2): qty 1

## 2014-07-20 MED ORDER — MAGNESIUM SULFATE 40 MG/ML IJ SOLN
2.0000 g | Freq: Once | INTRAMUSCULAR | Status: AC
Start: 2014-07-20 — End: 2014-07-20
  Administered 2014-07-20: 2 g via INTRAVENOUS
  Filled 2014-07-20: qty 50

## 2014-07-20 NOTE — Progress Notes (Signed)
[facility] PROGRESS NOTE  LAINI URICK VQQ:[id number] DOB: June 07, 1929 DOA: 07/11/2014 PCP: Marton Redwood, MD   HPI/Subjective: Sleepy this morning, her husband think this is secondary to Remeron she got last night. Potassium is 2.7, we will replete orally.  Interim Summary Patient is a pleasant 80 year old female, with a past medical history of cognitive impairment, recurrent urinary tract infections, currently residing at home with her family, who was admitted to the pulmonary critical care medicine service on 07/12/2014. She presented with a steep functional decline, mental status changes, and with fever. In the emergency department she was found to be hypotensive and lethargic, with initial lab work showing a lactate of 7.35. She was in septic shock and started on pressor support. She underwent rapid sequence intubation. Source of infection urinary tract. A CT scan of abdomen revealed obstructing stone in the lower pole on the right and in the upper pole on the left, evidence of hydronephrosis in left kidney. Her anticoagulation was reversed as urology and interventional radiology consulted. On 07/12/2014 patient undergoing successful ultrasound and fluoroscopic guided placement of left sided PCN, without immediate complications. She had been started on broad-spectrum IV antibiotic therapy with vancomycin and ceftaz edema. By 07/14/2014 patient has showed clinical improvement as she was successfully extubated. By this point she had been off of pressor support. She was transferred to the floor on 07/15/2014. Cultures growing enterococcus species and Morganella. Enterococcus species susceptible to ampicillin with Morganella species resistant to ampicillin Augmentin, susceptible to Ciprofloxacin. Patient deconditioned, family wishing to the take her home with home health services. She was transitioned to oral antibiotic therapy on 07/19/2014, anticipate discharge in the next 24-48 hours.    Assessment/Plan:  1. Severe sepsis -Secondary to pyelonephritis -Blood cultures growing Morganella and enterococcus species -Enterococcus species susceptible to ampicillin, Morganella resistant to ampicillin, sensitive to Cipro -Patient currently amoxicillin and ciprofloxacin -Repeat blood cultures on 07/16/2014 showing no growth to date.   2. Acute kidney injury -Secondary to postobstructive nephropathy -CT scan showing bilateral proximal ureteral calculi with left sided hydronephrosis -Resolved  3. Bilateral proximal ureteral calculi -Urology following -Dr Diona Fanti recommending to wait 2 weeks for resolution of infection, at which point could do bilateral ureteroscopy for stone extraction - Status post percutaneous nephrostomy performed at 07/12/2014  4. Hypertension -Continue metoprolol 12.5 mg by mouth twice a day. A pressure stable with last blood pressure 121/63  5. Deconditioning -Patient did not qualify for CIR, will likely go home with home health services.  6. Hypokalemia -Was also relatively low magnesium of 1.5, replete both.  Code Status: Full Code Family Communication: Spoke with husband present at bedside Disposition Plan: Anticipate discharge in AM   Consultants:  Urology  PCCM  Procedures: LINES/TUBES:  ETT 8/24 >> 8/25  R radial art line 8/24 >> 8/26  R IJ CVL 8/24 >> 8/27 Percutaneous nephrostomy placed on a 24 2015  Antibiotics:  Ciprofloxacin  Amoxicillin   Objective: Filed Vitals:   07/20/14 0505  BP: 132/81  Pulse: 81  Temp: 98 F (36.7 C)  Resp: 18    Intake/Output Summary (Last 24 hours) at 07/20/14 1032 Last data filed at 07/20/14 0500  Gross per 24 hour  Intake    387 ml  Output    420 ml  Net    -33 ml   Filed Weights   07/18/14 0609 07/19/14 0519 07/20/14 0505  Weight: 63.912 kg (140 lb 14.4 oz) 64.864 kg (143 lb) 64.774 kg (142 lb 12.8 oz)  Exam:   General:  Patient is in no acute distress, seems less  sedated  Cardiovascular: Regular rate and rhythm normal S1-S2  Respiratory: Normal respiratory effort, having a few bibasal crackles  Abdomen: Soft nontender nondistended  Musculoskeletal: No edema  Data Reviewed: Basic Metabolic Panel:  Recent Labs Lab 07/14/14 0350 07/15/14 0500 07/15/14 0545 07/17/14 0630 07/20/14 0509  NA 140  --  141 141 142  K 3.6*  --  3.8 3.6* 2.7*  CL 102  --  102 103 107  CO2 27  --  32 24 25  GLUCOSE 94  --  110* 60* 84  BUN 24*  --  20 19 12   CREATININE 0.68  --  0.65 0.59 0.57  CALCIUM 7.3*  --  8.0* 8.7 8.6  MG  --  2.0  --   --  1.5   Liver Function Tests:  Recent Labs Lab 07/14/14 0350  AST 38*  ALT 18  ALKPHOS 84  BILITOT 0.9  PROT 4.9*  ALBUMIN 2.1*   No results found for this basename: LIPASE, AMYLASE,  in the last 168 hours No results found for this basename: AMMONIA,  in the last 168 hours CBC:  Recent Labs Lab 07/14/14 0350 07/15/14 0545 07/17/14 0630 07/20/14 0509  WBC 15.7* 15.7* 12.0* 7.8  HGB 11.0* 11.3* 13.0 11.1*  HCT 33.2* 34.4* 38.6 33.7*  MCV 88.3 90.5 86.7 88.7  PLT 61* 61* 82* 173   Cardiac Enzymes: No results found for this basename: CKTOTAL, CKMB, CKMBINDEX, TROPONINI,  in the last 168 hours BNP (last 3 results) No results found for this basename: PROBNP,  in the last 8760 hours CBG:  Recent Labs Lab 07/14/14 0805 07/14/14 1235 07/14/14 1549 07/15/14 2152 07/18/14 2115  GLUCAP 87 124* 99 76 116*    Recent Results (from the past 240 hour(s))  URINE CULTURE     Status: None   Collection Time    07/11/14 10:17 PM      Result Value Ref Range Status   Specimen Description URINE, CATHETERIZED   Final   Special Requests NONE   Final   Culture  Setup Time     Final   Value: 07/12/2014 01:52     Performed at [facility]     Final   Value: >=100,000 COLONIES/ML     Performed at [facility]   Culture     Final   Value: [facility] MORGANII     Performed at  [facility]   Report Status 07/13/2014 FINAL   Final   Organism ID, Bacteria MORGANELLA MORGANII   Final  CULTURE, BLOOD (ROUTINE X 2)     Status: None   Collection Time    07/11/14 10:37 PM      Result Value Ref Range Status   Specimen Description BLOOD LEFT ARM   Final   Special Requests BOTTLES DRAWN AEROBIC AND ANAEROBIC [place]   Final   Culture  Setup Time     Final   Value: 07/12/2014 01:48     Performed at [facility]   Culture     Final   Value: [facility] MORGANII     Note: Gram Stain Report Called to,Read Back By and Verified With: TERESA CRITE@[email or web address]  ON [id number] BY [facility]     Performed at [facility]   Report Status 07/14/2014 FINAL   Final   Organism ID, Bacteria MORGANELLA MORGANII   Final  MRSA PCR SCREENING  Status: None   Collection Time    07/12/14  4:00 AM      Result Value Ref Range Status   MRSA by PCR NEGATIVE  NEGATIVE Final   Comment:            The GeneXpert MRSA Assay (FDA     approved for NASAL specimens     only), is one component of a     comprehensive MRSA colonization     surveillance program. It is not     intended to diagnose MRSA     infection nor to guide or     monitor treatment for     MRSA infections.  CULTURE, BLOOD (ROUTINE X 2)     Status: None   Collection Time    07/12/14  4:45 AM      Result Value Ref Range Status   Specimen Description BLOOD RIGHT HAND   Final   Special Requests BOTTLES DRAWN AEROBIC ONLY 3CC   Final   Culture  Setup Time     Final   Value: 07/12/2014 08:41     Performed at [facility]   Culture     Final   Value: NO GROWTH 5 DAYS     Performed at [facility]   Report Status 07/18/2014 FINAL   Final  CULTURE, RESPIRATORY (NON-EXPECTORATED)     Status: None   Collection Time    07/12/14  8:06 AM      Result Value Ref Range Status   Specimen Description TRACHEAL ASPIRATE   Final   Special Requests NONE   Final   Gram Stain     Final   Value: RARE WBC PRESENT,  PREDOMINANTLY MONONUCLEAR     NO SQUAMOUS EPITHELIAL CELLS SEEN     NO ORGANISMS SEEN     Performed at [facility]   Culture     Final   Value: Non-Pathogenic Oropharyngeal-type Flora Isolated.     Performed at [facility]   Report Status 07/14/2014 FINAL   Final  URINE CULTURE     Status: None   Collection Time    07/12/14 10:20 AM      Result Value Ref Range Status   Specimen Description URINE, CATHETERIZED   Final   Special Requests NONE   Final   Culture  Setup Time     Final   Value: 07/12/2014 11:06     Performed at [facility]     Final   Value: NO GROWTH     Performed at [facility]   Culture     Final   Value: NO GROWTH     Performed at [facility]   Report Status 07/13/2014 FINAL   Final  CULTURE, ROUTINE-ABSCESS     Status: None   Collection Time    07/12/14  1:30 PM      Result Value Ref Range Status   Specimen Description ABSCESS BLADDER   Final   Special Requests NONE   Final   Gram Stain     Final   Value: MODERATE WBC PRESENT, PREDOMINANTLY PMN     NO SQUAMOUS EPITHELIAL CELLS SEEN     NO ORGANISMS SEEN     Performed at [facility]   Culture     Final   Value: MODERATE MORGANELLA MORGANII     ABUNDANT ENTEROCOCCUS SPECIES     Performed at [facility]   Report Status  07/16/2014 FINAL   Final   Organism ID, Bacteria MORGANELLA MORGANII   Final   Organism ID, Bacteria ENTEROCOCCUS SPECIES   Final  CULTURE, BLOOD (ROUTINE X 2)     Status: None   Collection Time    07/16/14  3:00 PM      Result Value Ref Range Status   Specimen Description BLOOD RIGHT HAND   Final   Special Requests BOTTLES DRAWN AEROBIC ONLY 5CC   Final   Culture  Setup Time     Final   Value: 07/16/2014 19:17     Performed at [facility]   Culture     Final   Value:        BLOOD CULTURE RECEIVED NO GROWTH TO DATE CULTURE WILL BE HELD FOR 5 DAYS BEFORE ISSUING A FINAL NEGATIVE REPORT     Performed at  [facility]   Report Status PENDING   Incomplete  CULTURE, BLOOD (ROUTINE X 2)     Status: None   Collection Time    07/16/14  3:15 PM      Result Value Ref Range Status   Specimen Description BLOOD RIGHT HAND   Final   Special Requests BOTTLES DRAWN AEROBIC ONLY 3CC   Final   Culture  Setup Time     Final   Value: 07/16/2014 19:17     Performed at [facility]   Culture     Final   Value:        BLOOD CULTURE RECEIVED NO GROWTH TO DATE CULTURE WILL BE HELD FOR 5 DAYS BEFORE ISSUING A FINAL NEGATIVE REPORT     Performed at [facility]   Report Status PENDING   Incomplete     Studies: No results found.  Scheduled Meds: . acyclovir ointment   Topical QID  . amoxicillin  500 mg Oral Q12H  . chlorhexidine  15 mL Mouth Rinse BID  . ciprofloxacin  500 mg Oral BID  . digoxin  0.125 mg Oral Once per day on Mon Wed Fri  . docusate sodium  100 mg Oral BID  . donepezil  10 mg Oral QHS  . feeding supplement (RESOURCE BREEZE)  1 Container Oral TID BM  . fluconazole  100 mg Oral Daily  . metoprolol tartrate  12.5 mg Oral BID  . polyethylene glycol  17 g Oral Daily  . potassium chloride  60 mEq Oral Q4H  . rivaroxaban  20 mg Oral Q supper   Continuous Infusions: . sodium chloride Stopped (07/15/14 1330)    Active Problems:   Septic shock   Acute respiratory failure   Hypokalemia   Pyelonephritis   Bacteremia due to Gram-negative bacteria   Severe sepsis(995.92)    Time spent: 25 min    [facility] Pager [phone]. If 7PM-7AM, please contact night-coverage at [email or web address], password [facility] 07/20/2014, 10:32 AM  LOS: 9 days

## 2014-07-20 NOTE — Progress Notes (Signed)
CRITICAL VALUE ALERT  Critical value received:  K 2.7  Date of notification:  07/20/2014  Time of notification:  6:45am  Critical value read back:Yes.    Nurse who received alert:  Altamease Oiler  MD notified (1st page):  Kirby,NP  Time of first page:  6:46  MD notified (2nd page):  Time of second page:   Responding MD:  Samuella Bruin  Time MD responded:  6:55am

## 2014-07-20 NOTE — Progress Notes (Signed)
Subjective: L PCN placed 8/24 Pt has done well Plan for poss dc in am Bun/Cr wnl Wbc wnl  Objective: Vital signs in last 24 hours: Temp:  [97.7 F (36.5 C)-98.8 F (37.1 C)] 98 F (36.7 C) (09/01 0505) Pulse Rate:  [81-85] 81 (09/01 0505) Resp:  [18] 18 (09/01 0505) BP: (130-134)/(76-81) 132/81 mmHg (09/01 0505) SpO2:  [95 %-98 %] 95 % (09/01 0505) Weight:  [64.774 kg (142 lb 12.8 oz)] 64.774 kg (142 lb 12.8 oz) (09/01 0505) Last BM Date: 07/19/14  Intake/Output from previous day: 08/31 0701 - 09/01 0700 In: 387 [P.O.:357] Out: 1020 [Urine:1020] Intake/Output this shift: Total I/O In: 240 [P.O.:240] Out: -   PE: afeb; vss L PCN intact Site clean and dry; NT Output yellow 420 cc yesterday 300 cc in bag now Bun/Cr wnl Wbc wnl   Lab Results:   Recent Labs  07/20/14 0509  WBC 7.8  HGB 11.1*  HCT 33.7*  PLT 173   BMET  Recent Labs  07/20/14 0509  NA 142  K 2.7*  CL 107  CO2 25  GLUCOSE 84  BUN 12  CREATININE 0.57  CALCIUM 8.6   PT/INR No results found for this basename: LABPROT, INR,  in the last 72 hours ABG No results found for this basename: PHART, PCO2, PO2, HCO3,  in the last 72 hours  Studies/Results: No results found.  Anti-infectives: Anti-infectives   Start     Dose/Rate Route Frequency Ordered Stop   07/20/14 1000  fluconazole (DIFLUCAN) tablet 100 mg     100 mg Oral Daily 07/19/14 1358     07/19/14 2000  ciprofloxacin (CIPRO) tablet 500 mg     500 mg Oral 2 times daily 07/19/14 1816     07/16/14 1500  amoxicillin (AMOXIL) capsule 500 mg     500 mg Oral Every 12 hours 07/16/14 1358     07/16/14 1230  fluconazole (DIFLUCAN) IVPB 100 mg  Status:  Discontinued     100 mg 50 mL/hr over 60 Minutes Intravenous Every 24 hours 07/16/14 1131 07/19/14 1358   07/15/14 0930  cefTRIAXone (ROCEPHIN) 1 g in dextrose 5 % 50 mL IVPB  Status:  Discontinued     1 g 100 mL/hr over 30 Minutes Intravenous Every 24 hours 07/14/14 1136 07/15/14  0831   07/15/14 0930  cefTRIAXone (ROCEPHIN) 1 g in dextrose 5 % 50 mL IVPB  Status:  Discontinued     1 g 100 mL/hr over 30 Minutes Intravenous Every 24 hours 07/15/14 0831 07/19/14 1815   07/14/14 0800  vancomycin (VANCOCIN) IVPB 1000 mg/200 mL premix  Status:  Discontinued     1,000 mg 200 mL/hr over 60 Minutes Intravenous Every 24 hours 07/13/14 1101 07/13/14 1206   07/13/14 2200  cefTAZidime (FORTAZ) 1 g in dextrose 5 % 50 mL IVPB  Status:  Discontinued     1 g 100 mL/hr over 30 Minutes Intravenous Every 12 hours 07/13/14 1047 07/14/14 1125   07/13/14 1300  ciprofloxacin (CIPRO) IVPB 400 mg  Status:  Discontinued     400 mg 200 mL/hr over 60 Minutes Intravenous Every 12 hours 07/13/14 1206 07/14/14 1124   07/13/14 0800  vancomycin (VANCOCIN) 500 mg in sodium chloride 0.9 % 100 mL IVPB  Status:  Discontinued     500 mg 100 mL/hr over 60 Minutes Intravenous Every 24 hours 07/12/14 0404 07/13/14 1101   07/12/14 0600  cefTAZidime (FORTAZ) 1 g in dextrose 5 % 50 mL IVPB  Status:  Discontinued     1 g 100 mL/hr over 30 Minutes Intravenous Every 24 hours 07/12/14 0404 07/13/14 1047   07/12/14 0145  vancomycin (VANCOCIN) IVPB 1000 mg/200 mL premix  Status:  Discontinued     1,000 mg 200 mL/hr over 60 Minutes Intravenous  Once 07/12/14 0143 07/12/14 0403   07/12/14 0130  vancomycin (VANCOCIN) IVPB 1000 mg/200 mL premix     1,000 mg 200 mL/hr over 60 Minutes Intravenous  Once 07/12/14 0126 07/12/14 0552   07/11/14 2215  cefTRIAXone (ROCEPHIN) 1 g in dextrose 5 % 50 mL IVPB     1 g 100 mL/hr over 30 Minutes Intravenous  Once 07/11/14 2214 07/11/14 2320      Assessment/Plan: s/p * No surgery found *  L PCN intact Doing well Plan per [facility] poss dc in am   LOS: 9 days    Virginia Curl A 07/20/2014

## 2014-07-21 ENCOUNTER — Other Ambulatory Visit: Payer: Self-pay | Admitting: Urology

## 2014-07-21 LAB — CBC
HCT: 32.5 % — ABNORMAL LOW (ref 36.0–46.0)
Hemoglobin: 10.5 g/dL — ABNORMAL LOW (ref 12.0–15.0)
MCH: 28.5 pg (ref 26.0–34.0)
MCHC: 32.3 g/dL (ref 30.0–36.0)
MCV: 88.3 fL (ref 78.0–100.0)
Platelets: 225 10*3/uL (ref 150–400)
RBC: 3.68 MIL/uL — ABNORMAL LOW (ref 3.87–5.11)
RDW: 14.3 % (ref 11.5–15.5)
WBC: 7.3 10*3/uL (ref 4.0–10.5)

## 2014-07-21 LAB — BASIC METABOLIC PANEL
ANION GAP: 8 (ref 5–15)
BUN: 12 mg/dL (ref 6–23)
CO2: 23 mEq/L (ref 19–32)
Calcium: 8.4 mg/dL (ref 8.4–10.5)
Chloride: 111 mEq/L (ref 96–112)
Creatinine, Ser: 0.6 mg/dL (ref 0.50–1.10)
GFR, EST NON AFRICAN AMERICAN: 82 mL/min — AB (ref >90)
Glucose, Bld: 86 mg/dL (ref 70–99)
POTASSIUM: 4.3 meq/L (ref 3.7–5.3)
Sodium: 142 mEq/L (ref 137–147)

## 2014-07-21 LAB — MAGNESIUM: MAGNESIUM: 2 mg/dL (ref 1.5–2.5)

## 2014-07-21 MED ORDER — LACTULOSE 10 GM/15ML PO SOLN: 20.0000 g | Freq: Two times a day (BID) | ORAL | Status: DC | PRN

## 2014-07-21 MED ORDER — MENTHOL 3 MG MT LOZG: 1.0000 | LOZENGE | OROMUCOSAL | Status: DC | PRN

## 2014-07-21 MED ORDER — AMOXICILLIN 500 MG PO CAPS: 500.0000 mg | ORAL_CAPSULE | Freq: Three times a day (TID) | ORAL | Status: AC

## 2014-07-21 MED ORDER — FLUCONAZOLE 100 MG PO TABS: 100.0000 mg | ORAL_TABLET | Freq: Every day | ORAL | Status: DC

## 2014-07-21 MED ORDER — MAGNESIUM OXIDE 400 MG PO CAPS: ORAL_CAPSULE | ORAL | Status: DC

## 2014-07-21 MED ORDER — CIPROFLOXACIN HCL 500 MG PO TABS: 500.0000 mg | ORAL_TABLET | Freq: Two times a day (BID) | ORAL | Status: AC

## 2014-07-21 MED ORDER — ACYCLOVIR 5 % EX OINT: TOPICAL_OINTMENT | Freq: Four times a day (QID) | CUTANEOUS | Status: DC

## 2014-07-21 MED ORDER — DSS 100 MG PO CAPS: 100.0000 mg | ORAL_CAPSULE | Freq: Two times a day (BID) | ORAL | Status: DC

## 2014-07-21 MED ORDER — MENTHOL 3 MG MT LOZG
1.0000 | LOZENGE | OROMUCOSAL | Status: DC | PRN
  Filled 2014-07-21: qty 9

## 2014-07-21 NOTE — Progress Notes (Addendum)
Nsg Discharge Note  Admit Date:  07/11/2014 Discharge date: 07/21/2014   Drusilla Kanner Weese to be D/C'd Home per MD order.  AVS completed.  Copy for chart, and copy for patient signed, and dated. Patient/caregiver able to verbalize understanding.  Discharge Medication:   Medication List         acyclovir ointment 5 %  Commonly known as:  ZOVIRAX  Apply topically 4 (four) times daily.     amoxicillin 500 MG capsule  Commonly known as:  AMOXIL  Take 1 capsule (500 mg total) by mouth 3 (three) times daily.     CALCITRATE PO  Take 1 tablet by mouth daily.     ciprofloxacin 500 MG tablet  Commonly known as:  CIPRO  Take 1 tablet (500 mg total) by mouth 2 (two) times daily.     diazepam 5 MG tablet  Commonly known as:  VALIUM  Take 2.5-5 mg by mouth every 12 (twelve) hours as needed for anxiety.     digoxin 0.125 MG tablet  Commonly known as:  LANOXIN  Take 0.125 mg by mouth 3 (three) times a week. 1 pill on Monday, Wednesday, and Friday     donepezil 10 MG tablet  Commonly known as:  ARICEPT  Take 10 mg by mouth at bedtime.     DSS 100 MG Caps  Take 100 mg by mouth 2 (two) times daily.     FISH OIL PO  Take 1 capsule by mouth daily.     fluconazole 100 MG tablet  Commonly known as:  DIFLUCAN  Take 1 tablet (100 mg total) by mouth daily.     HYDROcodone-acetaminophen 5-325 MG per tablet  Commonly known as:  NORCO/VICODIN  Take 1-2 tablets by mouth every 4 (four) hours as needed for moderate pain.     lactose free nutrition Liqd  Take 237 mLs by mouth daily as needed (for nutrition).     lactulose 10 GM/15ML solution  Commonly known as:  CHRONULAC  Take 30 mLs (20 g total) by mouth 2 (two) times daily as needed for moderate constipation.     Magnesium Oxide 400 MG Caps  Twice a day     menthol-cetylpyridinium 3 MG lozenge  Commonly known as:  CEPACOL  Take 1 lozenge (3 mg total) by mouth as needed for sore throat.     metoprolol tartrate 25 MG tablet  Commonly  known as:  LOPRESSOR  Take 12.5 mg by mouth every morning.     multivitamin with minerals Tabs tablet  Take 1 tablet by mouth daily.     ondansetron 4 MG tablet  Commonly known as:  ZOFRAN  Take 1 tablet (4 mg total) by mouth every 8 (eight) hours as needed for nausea or vomiting.     VITAMIN C PO  Take by mouth.     XARELTO 20 MG Tabs tablet  Generic drug:  rivaroxaban  Take 1 tablet (20 mg total) by mouth daily.        Discharge Assessment: Filed Vitals:   07/21/14 0940  BP: 136/67  Pulse: 74  Temp:   Resp:    Skin clean, dry and intact without evidence of skin break down, no evidence of skin tears noted. Stage I healed, buttocks are slightly red, blanchable.  IV catheter discontinued intact. Site without signs and symptoms of complications - no redness or edema noted at insertion site, patient denies c/o pain - only slight tenderness at site.  Dressing with slight pressure applied.  D/c Instructions-Education: Discharge instructions given to patient/family with verbalized understanding. D/c education completed with patient/family including follow up instructions, medication list, d/c activities limitations if indicated, with other d/c instructions as indicated by MD - patient able to verbalize understanding, all questions fully answered. Patient instructed to return to ED, call 911, or call MD for any changes in condition.  Patient escorted via [place], and D/C home via private auto.  Dayle Points, RN 07/21/2014 1:43 PM

## 2014-07-21 NOTE — Discharge Summary (Signed)
Physician Discharge Summary  Penny [name] MRN: [id number] DOB/AGE: 1929-10-25 80 y.o.  PCP: Marton Redwood, MD   Admit date: 07/11/2014 Discharge date: 07/21/2014  Discharge Diagnoses:    bilateral proximal ureteral calculi with left sided hydronephrosis Severe sepsis   Septic shock Active Problems:   Acute respiratory failure   Hypokalemia   Pyelonephritis   Bacteremia due to Gram-negative bacteria   Severe sepsis(995.92)  Follow up recommendations Recheck CMP, magnesium, CBC in 5-7 days Followup with urology Dr. Barton Dubois  As scheduled      Medication List         acyclovir ointment 5 %  Commonly known as:  ZOVIRAX  Apply topically 4 (four) times daily.     amoxicillin 500 MG capsule  Commonly known as:  AMOXIL  Take 1 capsule (500 mg total) by mouth 3 (three) times daily.     CALCITRATE PO  Take 1 tablet by mouth daily.     ciprofloxacin 500 MG tablet  Commonly known as:  CIPRO  Take 1 tablet (500 mg total) by mouth 2 (two) times daily.     diazepam 5 MG tablet  Commonly known as:  VALIUM  Take 2.5-5 mg by mouth every 12 (twelve) hours as needed for anxiety.     digoxin 0.125 MG tablet  Commonly known as:  LANOXIN  Take 0.125 mg by mouth 3 (three) times a week. 1 pill on Monday, Wednesday, and Friday     donepezil 10 MG tablet  Commonly known as:  ARICEPT  Take 10 mg by mouth at bedtime.     DSS 100 MG Caps  Take 100 mg by mouth 2 (two) times daily.     FISH OIL PO  Take 1 capsule by mouth daily.     fluconazole 100 MG tablet  Commonly known as:  DIFLUCAN  Take 1 tablet (100 mg total) by mouth daily.     HYDROcodone-acetaminophen 5-325 MG per tablet  Commonly known as:  NORCO/VICODIN  Take 1-2 tablets by mouth every 4 (four) hours as needed for moderate pain.     lactose free nutrition Liqd  Take 237 mLs by mouth daily as needed (for nutrition).     lactulose 10 GM/15ML solution  Commonly known as:  CHRONULAC  Take 30 mLs (20 g  total) by mouth 2 (two) times daily as needed for moderate constipation.     menthol-cetylpyridinium 3 MG lozenge  Commonly known as:  CEPACOL  Take 1 lozenge (3 mg total) by mouth as needed for sore throat.     metoprolol tartrate 25 MG tablet  Commonly known as:  LOPRESSOR  Take 12.5 mg by mouth every morning.     multivitamin with minerals Tabs tablet  Take 1 tablet by mouth daily.     ondansetron 4 MG tablet  Commonly known as:  ZOFRAN  Take 1 tablet (4 mg total) by mouth every 8 (eight) hours as needed for nausea or vomiting.     VITAMIN C PO  Take by mouth.     XARELTO 20 MG Tabs tablet  Generic drug:  rivaroxaban  Take 1 tablet (20 mg total) by mouth daily.        Discharge Condition:   Disposition: 03-Skilled Nursing Facility   Consults: Neurology   Significant Diagnostic Studies: Ir Perc Nephrostomy Left  07/12/2014   INDICATION: History of bilateral renal stones, now with urosepsis presumably secondary to a partially obstructing stone within the superior moiety of the left  kidney. Please perform percutaneous nephrostomy catheter placement for infectious source control. Note, the patient was noted to have an elevated INR however per discussion with Dr. Alva Garnet, the decision was made to proceed with left-sided percutaneous nephrostomy catheter placement as the patient's coagulopathy is favored to be secondary to her septic state. Nonetheless, in attempts to optimize her coagulopathy, the patient received 2 units of FFP as well as a vitamin K prior to this procedure.  EXAM: 1. ULTRASOUND GUIDANCE FOR PUNCTURE OF THE LEFT RENAL COLLECTING SYSTEM 2. LEFT PERCUTANEOUS NEPHROSTOMY TUBE PLACEMENT.  COMPARISON:  CT abdomen pelvis - 07/12/2014; 05/27/2013; ultrasound fluoroscopic guided percutaneous nephrostomy catheter placement for percutaneous nephrolithotomy -06/04/2013.  MEDICATIONS: The patient is currently admitted to the hospital and receiving intravenous antibiotics.  The antibiotic was administered in an appropriate time frame prior to skin puncture.  ANESTHESIA/SEDATION: Fentanyl 12.5 mcg IV; Versed 0.5 mg IV  Total Moderate Sedation Time  15 minutes.  CONTRAST:  15 mL Isovue 300 administered into the collecting system  FLUOROSCOPY TIME:  1 minute 6 seconds.  COMPLICATIONS: None immediate  PROCEDURE: The procedure, risks, benefits, and alternatives were explained to the patient. Questions regarding the procedure were encouraged and answered. The patient understands and consents to the procedure. A timeout was performed prior to the initiation of the procedure.  Pre procedural ultrasound scanning demonstrates moderate dilatation of the superior moiety of the left kidney. The left inferior moiety appears normal without pelvicaliectasis. There is no significant dilatation of either the superior or the inferior moieties of the contralateral right kidney.  The bilateral flanks were prepped with Betadine in a sterile fashion, and a sterile drape was applied covering the operative field. A sterile gown and sterile gloves were used for the procedure.  Attention was initially paid towards placement of the left-sided percutaneous nephrostomy catheter. Local anesthesia was provided with 1% Lidocaine with epinephrine. Under direct ultrasound guidance, a 21 gauge needle was advanced into moderately dilated collecting system of the superior moiety of the left kidney. An ultrasound image documentation was performed. Access within the collecting system was confirmed with the efflux of foul smelling urine followed by the injection of a small amount of contrast.  Over a Nitrex wire and under intermittent fluoroscopic guidance, the tract was dilated with an Accustick stent. Over a guide wire, a 10-French percutaneous nephrostomy catheter was advanced into the collecting system where the coil was formed and locked. Limited contrast injection was performed and several sport radiographs were  obtained in various obliquities confirming access. The catheter was secured at the skin with a Prolene retention suture and a gravity bag was placed. A small amount of aspirated urine was capped and sent to the laboratory for analysis. A dressing was placed.  Attention was now paid towards the right-sided kidney. Again, ultrasound scanning failed to delineate dilatation of either the superior or inferior moieties of the right kidney. Given presumed infection source control following left-sided nephrostomy catheter as well as the patient's coagulopathy, a left-sided percutaneous nephrostomy catheter was not attempted.  The patient tolerated procedure well without immediate postprocedural complication.  FINDINGS: A preprocedural spot radiographic image demonstrates ill-defined opacities overlying the expected location of the superior aspects of the bilateral ureters as demonstrated on preceding abdominal CT.  Ultrasound scanning demonstrates moderate dilatation of the superior moiety of the left renal collecting system. Under direct ultrasound guidance, a posterior calyx within the superior moiety of the left kidney was targeted allowing advancement of an 10-French percutaneous nephrostomy catheter under intermittent  fluoroscopic guidance. Contrast injection confirmed appropriate positioning. Following percutaneous nephrostomy catheter placement, a small sample of foul smelling urine was aspirated, capped and sent the laboratory for analysis.  Given the lack of any right-sided pelvicaliectasis, as well as the patient's elevated INR, a right-sided percutaneous nephrostomy catheter was not attempted.  IMPRESSION: 1. Successful ultrasound and fluoroscopic guided placement of a left sided 10 French PCN. A small sample of foul smelling, brown colored urine was sent to the laboratory for analysis. 2. Given lack of any significant right-sided pelvicaliectasis, the patient's coagulopathic state and presumed infection source  control with placement of the left-sided nephrostomy, a right-sided percutaneous nephrostomy catheter was not attempted.  PLAN: Would recommend continued aggressive resuscitation. If patient's renal function and sepsis do not improve, would consider placement of a right-sided percutaneous nephrostomy catheter though again, this may prove difficult secondary to lack of any significant right-sided pelvicaliectasis.  Above findings were discussed with Dr. Alva Garnet at the time of procedure completion.   Electronically Signed   By: Sandi Mariscal M.D.   On: 07/12/2014 14:40   Ir US Guide Bx Asp/drain  07/12/2014   INDICATION: History of bilateral renal stones, now with urosepsis presumably secondary to a partially obstructing stone within the superior moiety of the left kidney. Please perform percutaneous nephrostomy catheter placement for infectious source control. Note, the patient was noted to have an elevated INR however per discussion with Dr. Alva Garnet, the decision was made to proceed with left-sided percutaneous nephrostomy catheter placement as the patient's coagulopathy is favored to be secondary to her septic state. Nonetheless, in attempts to optimize her coagulopathy, the patient received 2 units of FFP as well as a vitamin K prior to this procedure.  EXAM: 1. ULTRASOUND GUIDANCE FOR PUNCTURE OF THE LEFT RENAL COLLECTING SYSTEM 2. LEFT PERCUTANEOUS NEPHROSTOMY TUBE PLACEMENT.  COMPARISON:  CT abdomen pelvis - 07/12/2014; 05/27/2013; ultrasound fluoroscopic guided percutaneous nephrostomy catheter placement for percutaneous nephrolithotomy -06/04/2013.  MEDICATIONS: The patient is currently admitted to the hospital and receiving intravenous antibiotics. The antibiotic was administered in an appropriate time frame prior to skin puncture.  ANESTHESIA/SEDATION: Fentanyl 12.5 mcg IV; Versed 0.5 mg IV  Total Moderate Sedation Time  15 minutes.  CONTRAST:  15 mL Isovue 300 administered into the collecting system   FLUOROSCOPY TIME:  1 minute 6 seconds.  COMPLICATIONS: None immediate  PROCEDURE: The procedure, risks, benefits, and alternatives were explained to the patient. Questions regarding the procedure were encouraged and answered. The patient understands and consents to the procedure. A timeout was performed prior to the initiation of the procedure.  Pre procedural ultrasound scanning demonstrates moderate dilatation of the superior moiety of the left kidney. The left inferior moiety appears normal without pelvicaliectasis. There is no significant dilatation of either the superior or the inferior moieties of the contralateral right kidney.  The bilateral flanks were prepped with Betadine in a sterile fashion, and a sterile drape was applied covering the operative field. A sterile gown and sterile gloves were used for the procedure.  Attention was initially paid towards placement of the left-sided percutaneous nephrostomy catheter. Local anesthesia was provided with 1% Lidocaine with epinephrine. Under direct ultrasound guidance, a 21 gauge needle was advanced into moderately dilated collecting system of the superior moiety of the left kidney. An ultrasound image documentation was performed. Access within the collecting system was confirmed with the efflux of foul smelling urine followed by the injection of a small amount of contrast.  Over a Nitrex wire and under  intermittent fluoroscopic guidance, the tract was dilated with an Accustick stent. Over a guide wire, a 10-French percutaneous nephrostomy catheter was advanced into the collecting system where the coil was formed and locked. Limited contrast injection was performed and several sport radiographs were obtained in various obliquities confirming access. The catheter was secured at the skin with a Prolene retention suture and a gravity bag was placed. A small amount of aspirated urine was capped and sent to the laboratory for analysis. A dressing was placed.   Attention was now paid towards the right-sided kidney. Again, ultrasound scanning failed to delineate dilatation of either the superior or inferior moieties of the right kidney. Given presumed infection source control following left-sided nephrostomy catheter as well as the patient's coagulopathy, a left-sided percutaneous nephrostomy catheter was not attempted.  The patient tolerated procedure well without immediate postprocedural complication.  FINDINGS: A preprocedural spot radiographic image demonstrates ill-defined opacities overlying the expected location of the superior aspects of the bilateral ureters as demonstrated on preceding abdominal CT.  Ultrasound scanning demonstrates moderate dilatation of the superior moiety of the left renal collecting system. Under direct ultrasound guidance, a posterior calyx within the superior moiety of the left kidney was targeted allowing advancement of an 10-French percutaneous nephrostomy catheter under intermittent fluoroscopic guidance. Contrast injection confirmed appropriate positioning. Following percutaneous nephrostomy catheter placement, a small sample of foul smelling urine was aspirated, capped and sent the laboratory for analysis.  Given the lack of any right-sided pelvicaliectasis, as well as the patient's elevated INR, a right-sided percutaneous nephrostomy catheter was not attempted.  IMPRESSION: 1. Successful ultrasound and fluoroscopic guided placement of a left sided 10 French PCN. A small sample of foul smelling, brown colored urine was sent to the laboratory for analysis. 2. Given lack of any significant right-sided pelvicaliectasis, the patient's coagulopathic state and presumed infection source control with placement of the left-sided nephrostomy, a right-sided percutaneous nephrostomy catheter was not attempted.  PLAN: Would recommend continued aggressive resuscitation. If patient's renal function and sepsis do not improve, would consider placement  of a right-sided percutaneous nephrostomy catheter though again, this may prove difficult secondary to lack of any significant right-sided pelvicaliectasis.  Above findings were discussed with Dr. Alva Garnet at the time of procedure completion.   Electronically Signed   By: Sandi Mariscal M.D.   On: 07/12/2014 14:40   Dg Chest Port 1 View  07/13/2014   CLINICAL DATA:  Respiratory failure, shortness of breath.  EXAM: PORTABLE CHEST - 1 VIEW  COMPARISON:  July 12, 2014.  FINDINGS: Stable cardiomediastinal silhouette. Endotracheal tube is in grossly good position with distal tip 2 cm above the carina. Nasogastric tube is seen passing through the esophagus and entering the stomach. Stable right internal jugular catheter line is noted with tip in expected position of the SVC. Status post right shoulder arthroplasty. No pneumothorax is noted. Right lung is clear. Stable left basilar opacity is noted concerning for atelectasis or pneumonia with associated pleural effusion.  IMPRESSION: Stable mild left basilar opacity consistent with pneumonia or atelectasis with associated pleural effusion.   Electronically Signed   By: Sabino Dick M.D.   On: 07/13/2014 07:38   Dg Chest Portable 1 View  07/12/2014   CLINICAL DATA:  Sepsis  EXAM: PORTABLE CHEST - 1 VIEW  COMPARISON:  Portable chest x-ray of July 11, 2014  FINDINGS: The lungs are adequately inflated. The retrocardiac region on the left remains dense but has improved slightly. There remains partial obscuration of the left  hemidiaphragm. The cardiac silhouette is top-normal in size. The pulmonary vascularity is not engorged.  The endotracheal tube tip lies 3.2 cm above the crotch of the carina. The esophagogastric tube tip projects off the inferior margin of the image. The right internal jugular venous catheter tip lies in the proximal SVC.  IMPRESSION: 1. Slightly improved appearance of the left lower lobe since yesterday's study posterior related to intubation.  Atelectasis and/or pneumonia here persists. 2. The support tubes and lines are in appropriate position radiographically. There is no postprocedure complication following placement of the right internal jugular venous catheter.   Electronically Signed   By: David  Martinique   On: 07/12/2014 07:44   Dg Chest Port 1 View  07/11/2014   CLINICAL DATA:  Altered mental status.  EXAM: PORTABLE CHEST - 1 VIEW  COMPARISON:  03/31/2014  FINDINGS: Mild cardiac enlargement without vascular congestion. Visualization of the left lung base is limited because the patient's hand is superimposed. Infiltration or atelectasis in the left base is not excluded. No focal consolidation on the right. No pneumothorax. Calcification of the aorta. Postoperative changes in the right shoulder. Thoracic curvature convex towards the right with suggestion of sclerotic vertebrae in the mid/ upper thoracic region. Portion of fracture in the left proximal humerus is again demonstrated.  IMPRESSION: No definite evidence of active pulmonary disease although infiltration or atelectasis cannot be excluded from the left lung base. Appearance of sclerosis in a mid/upper thoracic vertebra. Consider degenerative or metastatic disease.   Electronically Signed   By: Lucienne Capers M.D.   On: 07/11/2014 22:52   Ct Renal Stone Study  07/12/2014   CLINICAL DATA:  Abdominal pain, bilateral flank pain, UTI and fever.  EXAM: CT RENAL STONE PROTOCOL  TECHNIQUE: Multidetector CT imaging of the abdomen and pelvis was performed following the standard protocol without intravenous contrast  COMPARISON:  05/1913  FINDINGS: Small bilateral pleural effusions with basilar atelectasis, slightly greater on the left. Mild bronchiectasis. Nodular interstitial infiltrates in the lung bases. Small esophageal hiatal hernia. Coronary artery calcifications.  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 In the lower pole moiety on the right, there is  a 7.6 mm ovoid stone with proximal pyelocaliectasis consistent with obstruction. In the upper pole moiety on the left, there is a 7.8 mm stone with proximal hydronephrosis consistent with obstruction. The lower pole moiety on the left in the upper pole moiety on the right are not obstructed. Additional punctate size stones are demonstrated in the lower pole of the right kidney. Bladder is decompressed but no bladder stones are appreciated. There is fairly extensive perirenal edema and stranding with small amount of ascites and edema demonstrated throughout the mesenteric and extending into the pelvis. Small amount of pelvic free fluid. These changes are likely inflammatory and suggest infected stones or pyelonephritis.  The gallbladder wall appears thickened and there is increased density in the gallbladder most likely representing sludge. Suggestion of new mobile area which may indicate gallbladder infection as well. Circumscribed low-attenuation changes throughout the kidneys appear similar to prior study and probably represent cysts. Spleen size is normal. No adrenal gland nodules. Calcification of the abdominal aorta. Normal caliber aorta and inferior vena cava. Stomach, small bowel, and colon are not pathologically enlarged. There is a soft tissue mass with calcification in the left mid abdomen. This measures about 5.2 x 2.8 cm. This is similar in size and appearance to previous study. Calcified nodules are demonstrated in the subcutaneous fat  of the anterior abdominal wall. No free air in the abdomen.  Pelvis: There are bilateral inguinal hernias containing fat. No bowel herniation or obstruction. Uterus appears to be surgically absent. No pelvic mass or lymphadenopathy is suggested. Appendix is not identified. Degenerative changes in the spine and shoulders. Mild lumbar scoliosis. No destructive bone lesions.  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 Obstructing stone in the lower  pole moiety on the right and in the upper pole more raphe on the left. Diffuse inflammatory infiltration around the kidneys and throughout the upper abdomen and mesentery with small amount of ascites. Inflammatory changes also demonstrated in the gallbladder with evidence of mild pneumobilia and gallbladder sludge. Left mid abdominal mesenteric mass, stable since previous study.   Electronically Signed   By: Lucienne Capers M.D.   On: 07/12/2014 03:29      Microbiology: Recent Results (from the past 240 hour(s))  URINE CULTURE     Status: None   Collection Time    07/11/14 10:17 PM      Result Value Ref Range Status   Specimen Description URINE, CATHETERIZED   Final   Special Requests NONE   Final   Culture  Setup Time     Final   Value: 07/12/2014 01:52     Performed at [facility]     Final   Value: >=100,000 COLONIES/ML     Performed at [facility]   Culture     Final   Value: [facility] MORGANII     Performed at [facility]   Report Status 07/13/2014 FINAL   Final   Organism ID, Bacteria MORGANELLA MORGANII   Final  CULTURE, BLOOD (ROUTINE X 2)     Status: None   Collection Time    07/11/14 10:37 PM      Result Value Ref Range Status   Specimen Description BLOOD LEFT ARM   Final   Special Requests BOTTLES DRAWN AEROBIC AND ANAEROBIC [facility] EACH   Final   Culture  Setup Time     Final   Value: 07/12/2014 01:48     Performed at [facility]   Culture     Final   Value: [facility] MORGANII     Note: Gram Stain Report Called to,Read Back By and Verified With: TERESA [email or web address]  ON [id number] BY [facility]     Performed at [facility]   Report Status 07/14/2014 FINAL   Final   Organism ID, Bacteria MORGANELLA MORGANII   Final  MRSA PCR SCREENING     Status: None   Collection Time    07/12/14  4:00 AM      Result Value Ref Range Status   MRSA by PCR NEGATIVE  NEGATIVE Final   Comment:            The GeneXpert MRSA Assay (FDA      approved for NASAL specimens     only), is one component of a     comprehensive MRSA colonization     surveillance program. It is not     intended to diagnose MRSA     infection nor to guide or     monitor treatment for     MRSA infections.  CULTURE, BLOOD (ROUTINE X 2)     Status: None   Collection Time    07/12/14  4:45 AM      Result Value Ref Range Status   Specimen Description BLOOD RIGHT HAND  Final   Special Requests BOTTLES DRAWN AEROBIC ONLY 3CC   Final   Culture  Setup Time     Final   Value: 07/12/2014 08:41     Performed at [facility]   Culture     Final   Value: NO GROWTH 5 DAYS     Performed at [facility]   Report Status 07/18/2014 FINAL   Final  CULTURE, RESPIRATORY (NON-EXPECTORATED)     Status: None   Collection Time    07/12/14  8:06 AM      Result Value Ref Range Status   Specimen Description TRACHEAL ASPIRATE   Final   Special Requests NONE   Final   Gram Stain     Final   Value: RARE WBC PRESENT, PREDOMINANTLY MONONUCLEAR     NO SQUAMOUS EPITHELIAL CELLS SEEN     NO ORGANISMS SEEN     Performed at [facility]   Culture     Final   Value: Non-Pathogenic Oropharyngeal-type Flora Isolated.     Performed at [facility]   Report Status 07/14/2014 FINAL   Final  URINE CULTURE     Status: None   Collection Time    07/12/14 10:20 AM      Result Value Ref Range Status   Specimen Description URINE, CATHETERIZED   Final   Special Requests NONE   Final   Culture  Setup Time     Final   Value: 07/12/2014 11:06     Performed at [place]     Final   Value: NO GROWTH     Performed at [facility]   Culture     Final   Value: NO GROWTH     Performed at [facility]   Report Status 07/13/2014 FINAL   Final  CULTURE, ROUTINE-ABSCESS     Status: None   Collection Time    07/12/14  1:30 PM      Result Value Ref Range Status   Specimen Description ABSCESS BLADDER   Final    Special Requests NONE   Final   Gram Stain     Final   Value: MODERATE WBC PRESENT, PREDOMINANTLY PMN     NO SQUAMOUS EPITHELIAL CELLS SEEN     NO ORGANISMS SEEN     Performed at [facility]   Culture     Final   Value: MODERATE MORGANELLA MORGANII     ABUNDANT ENTEROCOCCUS SPECIES     Performed at [facility]   Report Status 07/16/2014 FINAL   Final   Organism ID, Bacteria MORGANELLA MORGANII   Final   Organism ID, Bacteria ENTEROCOCCUS SPECIES   Final  CULTURE, BLOOD (ROUTINE X 2)     Status: None   Collection Time    07/16/14  3:00 PM      Result Value Ref Range Status   Specimen Description BLOOD RIGHT HAND   Final   Special Requests BOTTLES DRAWN AEROBIC ONLY 5CC   Final   Culture  Setup Time     Final   Value: 07/16/2014 19:17     Performed at [facility]   Culture     Final   Value:        BLOOD CULTURE RECEIVED NO GROWTH TO DATE CULTURE WILL BE HELD FOR 5 DAYS BEFORE ISSUING A FINAL NEGATIVE REPORT     Performed at [facility]   Report Status PENDING  Incomplete  CULTURE, BLOOD (ROUTINE X 2)     Status: None   Collection Time    07/16/14  3:15 PM      Result Value Ref Range Status   Specimen Description BLOOD RIGHT HAND   Final   Special Requests BOTTLES DRAWN AEROBIC ONLY 3CC   Final   Culture  Setup Time     Final   Value: 07/16/2014 19:17     Performed at [facility]   Culture     Final   Value:        BLOOD CULTURE RECEIVED NO GROWTH TO DATE CULTURE WILL BE HELD FOR 5 DAYS BEFORE ISSUING A FINAL NEGATIVE REPORT     Performed at [facility]   Report Status PENDING   Incomplete     Labs: Results for orders placed during the hospital encounter of 07/11/14 (from the past 48 hour(s))  BASIC METABOLIC PANEL     Status: Abnormal   Collection Time    07/20/14  5:09 AM      Result Value Ref Range   Sodium 142  137 - 147 mEq/L   Potassium 2.7 (*) 3.7 - 5.3 mEq/L   Comment: CRITICAL RESULT CALLED TO, READ BACK BY  AND VERIFIED WITH:     COLUMBRES,S RN 07/20/2014 0638 JORDANS     REPEATED TO VERIFY   Chloride 107  96 - 112 mEq/L   CO2 25  19 - 32 mEq/L   Glucose, Bld 84  70 - 99 mg/dL   BUN 12  6 - 23 mg/dL   Creatinine, Ser 0.57  0.50 - 1.10 mg/dL   Calcium 8.6  8.4 - 10.5 mg/dL   GFR calc non Af Amer 83 (*) >90 mL/min   GFR calc Af Amer >90  >90 mL/min   Comment: (NOTE)     The eGFR has been calculated using the CKD EPI equation.     This calculation has not been validated in all clinical situations.     eGFR's persistently <90 mL/min signify possible Chronic Kidney     Disease.   Anion gap 10  5 - 15  CBC     Status: Abnormal   Collection Time    07/20/14  5:09 AM      Result Value Ref Range   WBC 7.8  4.0 - 10.5 K/uL   RBC 3.80 (*) 3.87 - 5.11 MIL/uL   Hemoglobin 11.1 (*) 12.0 - 15.0 g/dL   HCT 33.7 (*) 36.0 - 46.0 %   MCV 88.7  78.0 - 100.0 fL   MCH 29.2  26.0 - 34.0 pg   MCHC 32.9  30.0 - 36.0 g/dL   RDW 14.1  11.5 - 15.5 %   Platelets 173  150 - 400 K/uL  MAGNESIUM     Status: None   Collection Time    07/20/14  5:09 AM      Result Value Ref Range   Magnesium 1.5  1.5 - 2.5 mg/dL  MAGNESIUM     Status: None   Collection Time    07/21/14  6:05 AM      Result Value Ref Range   Magnesium 2.0  1.5 - 2.5 mg/dL  BASIC METABOLIC PANEL     Status: Abnormal   Collection Time    07/21/14  6:05 AM      Result Value Ref Range   Sodium 142  137 - 147 mEq/L   Potassium 4.3  3.7 - 5.3 mEq/L  Comment: NO VISIBLE HEMOLYSIS   Chloride 111  96 - 112 mEq/L   CO2 23  19 - 32 mEq/L   Glucose, Bld 86  70 - 99 mg/dL   BUN 12  6 - 23 mg/dL   Creatinine, Ser 0.60  0.50 - 1.10 mg/dL   Calcium 8.4  8.4 - 10.5 mg/dL   GFR calc non Af Amer 82 (*) >90 mL/min   GFR calc Af Amer >90  >90 mL/min   Comment: (NOTE)     The eGFR has been calculated using the CKD EPI equation.     This calculation has not been validated in all clinical situations.     eGFR's persistently <90 mL/min signify possible  Chronic Kidney     Disease.   Anion gap 8  5 - 15  CBC     Status: Abnormal   Collection Time    07/21/14  6:05 AM      Result Value Ref Range   WBC 7.3  4.0 - 10.5 K/uL   RBC 3.68 (*) 3.87 - 5.11 MIL/uL   Hemoglobin 10.5 (*) 12.0 - 15.0 g/dL   HCT 32.5 (*) 36.0 - 46.0 %   MCV 88.3  78.0 - 100.0 fL   MCH 28.5  26.0 - 34.0 pg   MCHC 32.3  30.0 - 36.0 g/dL   RDW 14.3  11.5 - 15.5 %   Platelets 225  150 - 400 K/uL     Interim Summary  Patient is a pleasant 80 year old female, with a past medical history of cognitive impairment, recurrent urinary tract infections, currently residing at home with her family, who was admitted to the pulmonary critical care medicine service on 07/12/2014. She presented with a steep functional decline, mental status changes, and with fever. In the emergency department she was found to be hypotensive and lethargic, with initial lab work showing a lactate of 7.35. She was in septic shock and started on pressor support. She underwent rapid sequence intubation. Source of infection urinary tract. A CT scan of abdomen revealed obstructing stone in the lower pole on the right and in the upper pole on the left, evidence of hydronephrosis in left kidney. Her anticoagulation was reversed as urology and interventional radiology consulted. On 07/12/2014 patient undergoing successful ultrasound and fluoroscopic guided placement of left sided percutaneous nephrostomy, without immediate complications. She had been started on broad-spectrum IV antibiotic therapy with vancomycin and ceftaz edema. By 07/14/2014 patient has showed clinical improvement as she was successfully extubated. By this point she had been off of pressor support. She was transferred to the floor on 07/15/2014.  Cultures growing enterococcus species and Morganella. Enterococcus species susceptible to ampicillin with Morganella species resistant to ampicillin Augmentin, susceptible to Ciprofloxacin. Patient  deconditioned, family wishing to the take her home with home health services. She was transitioned to oral antibiotic therapy on 07/19/2014,   Assessment/Plan:  1. Severe sepsis -Secondary to pyelonephritis  -Blood cultures growing Morganella and enterococcus species  -Enterococcus species susceptible to ampicillin, Morganella resistant to ampicillin, sensitive to Cipro  -Patient currently amoxicillin and ciprofloxacin for another 10 days -Repeat blood cultures on 07/16/2014 showing no growth to date.  2. Acute kidney injury  -Secondary to postobstructive nephropathy  -CT scan showing bilateral proximal ureteral calculi with left sided hydronephrosis  -Resolved   3. Bilateral proximal ureteral calculi  -Urology following  -Dr Diona Fanti recommending to wait 2 weeks for resolution of infection, at which point could do bilateral ureteroscopy for stone extraction  -  Status post percutaneous nephrostomy performed at 07/12/2014  Bun/Cr wnl  4. Hypertension  -Continue metoprolol 12.5 mg by mouth twice a day. A pressure stable with last blood pressure 121/63  5. Deconditioning  -Patient did not qualify for CIR, will likely go home with home health services.  6. Hypokalemia  -Was also relatively low magnesium of 1.5, replete both.     Discharge Exam: * Blood pressure 136/67, pulse 74, temperature 97.7 F (36.5 C), temperature source Oral, resp. rate 18, height 5' 4.17" (1.63 m), weight 65.77 kg (144 lb 15.9 oz), SpO2 96.00%. General: Patient is in no acute distress, seems less sedated Cardiovascular: Regular rate and rhythm normal S1-S2 Respiratory: Normal respiratory effort, having a few bibasal crackles Abdomen: Soft nontender nondistended Musculoskeletal: No edema         Discharge Instructions   Diet - low sodium heart healthy    Complete by:  As directed      Increase activity slowly    Complete by:  As directed            Follow-up Information   Follow up with Marton Redwood, MD. Schedule an appointment as soon as possible for a visit in 1 week.   Specialty:  Internal Medicine   Contact information:   [place] [address] [phone]       Follow up with Jorja Loa, MD In 1 week.   Specialty:  Urology   Contact information:   [place] [address] [phone]       Signed: Reyne Dumas 07/21/2014, 11:22 AM

## 2014-07-22 LAB — CULTURE, BLOOD (ROUTINE X 2)
Culture: NO GROWTH
Culture: NO GROWTH

## 2014-08-12 ENCOUNTER — Encounter (HOSPITAL_COMMUNITY): Payer: Self-pay | Admitting: Pharmacy Technician

## 2014-08-18 ENCOUNTER — Ambulatory Visit (HOSPITAL_COMMUNITY)
Admission: RE | Admit: 2014-08-18 | Discharge: 2014-08-18 | Disposition: A | Discharge status: Home / Self Care | Payer: Medicare Other | Source: Ambulatory Visit | Attending: Urology | Admitting: Urology

## 2014-08-18 ENCOUNTER — Encounter (HOSPITAL_COMMUNITY)
Admission: RE | Admit: 2014-08-18 | Discharge: 2014-08-18 | Disposition: A | Discharge status: Home / Self Care | Payer: Medicare Other | Source: Ambulatory Visit | Attending: Urology | Admitting: Urology

## 2014-08-18 ENCOUNTER — Encounter (HOSPITAL_COMMUNITY): Payer: Self-pay

## 2014-08-18 DIAGNOSIS — Z01818 Encounter for other preprocedural examination: Secondary | ICD-10-CM | POA: Insufficient documentation

## 2014-08-18 HISTORY — DX: Personal history of other medical treatment: Z92.89

## 2014-08-18 HISTORY — DX: Cardiac arrhythmia, unspecified: I49.9

## 2014-08-18 LAB — BASIC METABOLIC PANEL
Anion gap: 11 (ref 5–15)
BUN: 18 mg/dL (ref 6–23)
CO2: 28 meq/L (ref 19–32)
Calcium: 10.4 mg/dL (ref 8.4–10.5)
Chloride: 102 mEq/L (ref 96–112)
Creatinine, Ser: 0.66 mg/dL (ref 0.50–1.10)
GFR calc Af Amer: >90 mL/min (ref >90)
GFR, EST NON AFRICAN AMERICAN: 79 mL/min — AB (ref >90)
Glucose, Bld: 97 mg/dL (ref 70–99)
POTASSIUM: 3.7 meq/L (ref 3.7–5.3)
SODIUM: 141 meq/L (ref 137–147)

## 2014-08-18 LAB — CBC
HCT: 42.3 % (ref 36.0–46.0)
HEMOGLOBIN: 13.7 g/dL (ref 12.0–15.0)
MCH: 29.5 pg (ref 26.0–34.0)
MCHC: 32.4 g/dL (ref 30.0–36.0)
MCV: 91.2 fL (ref 78.0–100.0)
Platelets: 211 10*3/uL (ref 150–400)
RBC: 4.64 MIL/uL (ref 3.87–5.11)
RDW: 14.2 % (ref 11.5–15.5)
WBC: 8.1 10*3/uL (ref 4.0–10.5)

## 2014-08-18 NOTE — Patient Instructions (Addendum)
Penny [name]  08/18/2014   Your procedure is scheduled on: 08/26/2014     Report to [facility].  Follow the Signs to [place] at   1000     am  Call this number if you have problems the morning of surgery: [phone]   Remember:   Do not eat food or drink liquids after midnight.   Take these medicines the morning of surgery with A SIP OF WATER:metoprolol( lopressor )    Do not wear jewelry, make-up or nail polish.  Do not wear lotions, powders, or perfumes. r deodorant.  Do not shave 48 hours prior to surgery.   Do not bring valuables to the hospital.  Contacts, dentures or bridgework may not be worn into surgery.  Leave suitcase in the car. After surgery it may be brought to your room.  For patients admitted to the hospital, checkout time is 11:00 AM the day of  discharge.         Please read over the following fact sheets that you were given:[place] - Preparing for Surgery Before surgery, you can play an important role.  Because skin is not sterile, your skin needs to be as free of germs as possible.  You can reduce the number of germs on your skin by washing with CHG (chlorahexidine gluconate) soap before surgery.  CHG is an antiseptic cleaner which kills germs and bonds with the skin to continue killing germs even after washing. Please DO NOT use if you have an allergy to CHG or antibacterial soaps.  If your skin becomes reddened/irritated stop using the CHG and inform your nurse when you arrive at Short Stay. Do not shave (including legs and underarms) for at least 48 hours prior to the first CHG shower.  You may shave your face/neck. Please follow these instructions carefully:  1.  Shower with CHG Soap the night before surgery and the  morning of Surgery.  2.  If you choose to wash your hair, wash your hair first as usual with your  normal  shampoo.  3.  After you shampoo, rinse your hair and body thoroughly to remove the  shampoo.                            4.  Use CHG as you would any other liquid soap.  You can apply chg directly  to the skin and wash                       Gently with a scrungie or clean washcloth.  5.  Apply the CHG Soap to your body ONLY FROM THE NECK DOWN.   Do not use on face/ open                           Wound or open sores. Avoid contact with eyes, ears mouth and genitals (private parts).                       Wash face,  Genitals (private parts) with your normal soap.             6.  Wash thoroughly, paying special attention to the area where your surgery  will be performed.  7.  Thoroughly rinse your body with warm water from the neck down.  8.  DO NOT shower/wash with your normal  soap after using and rinsing off  the CHG Soap.                9.  Pat yourself dry with a clean towel.            10.  Wear clean pajamas.            11.  Place clean sheets on your bed the night of your first shower and do not  sleep with pets. Day of Surgery : Do not apply any lotions/deodorants the morning of surgery.  Please wear clean clothes to the hospital/surgery center.  FAILURE TO FOLLOW THESE INSTRUCTIONS MAY RESULT IN THE CANCELLATION OF YOUR SURGERY PATIENT SIGNATURE_________________________________  NURSE SIGNATURE__________________________________  ________________________________________________________________________ , coughing and deep breathing exercises, leg exercises

## 2014-08-18 NOTE — Progress Notes (Signed)
ECHO- 01/13/13 EPIC  DR Mare Ferrari- LOV- 05/19/2014 EPIC  EKG- 07/12/14- EPIC  CXR 08/18/14 EPIC

## 2014-08-24 ENCOUNTER — Other Ambulatory Visit: Payer: Self-pay | Admitting: *Deleted

## 2014-08-24 MED ORDER — METOPROLOL TARTRATE 25 MG PO TABS: 12.5000 mg | ORAL_TABLET | Freq: Every morning | ORAL | Status: DC

## 2014-08-25 NOTE — H&P (Signed)
H&P  Chief Complaint: Kidney stones  History of Present Illness: Penny [name] is a 80 y.o. year old female who presents at this time for cystoscopy and bilateral ureteroscopic stone management. She was recently hospitalized for urosepsis, and had an obstructing left upper ureteral stone. She had percutaneous drainage of the left renal unit. She subsequently improved, although with her Alzheimer's, she does have moderate debilitation. We have delayed her surgical management to allow for recovery of her recent episode. She has an asymptomatic but enlarging right renal stone. She will be undergoing treatment of that as well. I had extensive discussions with the patient's family regarding management. I feel that it is necessary to proceed with an anesthetic procedure at this time to relieve her of her stone burden and get rid of her nephrostomy tube.  Past Medical History  Diagnosis Date  . Long-term (current) use of anticoagulants   . Chronic shoulder pain   . Heart palpitations     rare  . Memory problem     "a little short term and long term memory loss" (03/31/2014)  . Depression   . Hyperlipidemia   . Dyslipidemia   . Seasonal allergies   . Anxiety   . Hypertension   . CHF (congestive heart failure)   . Atrial fibrillation   . Humerus fracture 03/31/2014    "fell and broke left arm"  . Kidney stones   . Anginal pain   . Blood in urine     "chronic" (03/31/2014)  . Arthritis     "joints" (03/31/2014)  . Osteopenia     "severe" (03/31/2014)  . Syncope and collapse 03/31/2014  . Alzheimer's dementia 03/31/2014  . Dysrhythmia     atrail fib   . Hx of transfusion of packed red blood cells     Past Surgical History  Procedure Laterality Date  . Ureteral stent placement Bilateral     for stenosis; "they've been removed"  . Removal of growth  2007    growth under kidney removed and possibly kindey stones removed  . Knee arthroscopy Right 2011  . Total shoulder arthroplasty Right  10/2007  . Nephrolithotomy Right 06/04/2013    Procedure: NEPHROLITHOTOMY PERCUTANEOUS;  Surgeon: Franchot Gallo, MD;  Location: [facility]S;  Service: Urology;  Laterality: Right;  . Appendectomy  ~ 1953  . Bladder suspension      w/hysterectomy  . Cardioversion  2006  . Meniscus repair Left 07/2000    Home Medications:  No prescriptions prior to admission    Allergies:  Allergies  Allergen Reactions  . Sulfa Antibiotics Rash  . Zocor [Simvastatin] Other (See Comments)    Family History  Problem Relation Age of Onset  . Heart disease Mother     Social History:  reports that she quit smoking about 33 years ago. Her smoking use included Cigarettes. She has a 1.25 pack-year smoking history. She has never used smokeless tobacco. She reports that she does not drink alcohol or use illicit drugs.  ROS: Memory loss, weakness, some flank pain.  Physical Exam:  Vital signs in last 24 hours:   General:  Alert oriented to person, No acute distress HEENT: Normocephalic, atraumatic Neck: No JVD or lymphadenopathy Cardiovascular: Regular rate and rhythm Lungs: Clear bilaterally Abdomen: Soft, nontender, nondistended, no abdominal masses. Nephrostomy tube present in left flank. Back: No CVA tenderness Extremities: No edema Neurologic: Grossly intact  Laboratory Data:  No results found for this or any previous visit (from the past 24 hour(s)). No  results found for this or any previous visit (from the past 240 hour(s)). Creatinine: No results found for this basename: CREATININE,  in the last 168 hours  Radiologic Imaging: No results found.  Impression/Assessment:  1. Infected, obstructing left proximal ureteral stone, status post percutaneous tube placement, doing well 2. Resolved urosepsis 3. Relatively asymptomatic moderately large right renal pelvic stone 4. Bilateral partially duplicated collecting systems 5. Alzheimer's disease, with mild malnutrition    Plan:   ureteroscopic management of both her left and right upper tract calculi.  Jorja Loa 08/25/2014, 9:09 PM  Lillette Boxer. Josemiguel Gries MD

## 2014-08-26 ENCOUNTER — Ambulatory Visit (HOSPITAL_COMMUNITY): Payer: Medicare Other | Admitting: *Deleted

## 2014-08-26 ENCOUNTER — Observation Stay (HOSPITAL_COMMUNITY)
Admission: RE | Admit: 2014-08-26 | Discharge: 2014-08-27 | Disposition: A | Discharge status: Home / Self Care | Payer: Medicare Other | Source: Ambulatory Visit | Attending: Urology | Admitting: Urology

## 2014-08-26 ENCOUNTER — Encounter (HOSPITAL_COMMUNITY)
Admission: RE | Disposition: A | Discharge status: Home / Self Care | Payer: Self-pay | Source: Ambulatory Visit | Attending: Urology

## 2014-08-26 ENCOUNTER — Encounter (HOSPITAL_COMMUNITY): Payer: Medicare Other | Admitting: *Deleted

## 2014-08-26 ENCOUNTER — Encounter (HOSPITAL_COMMUNITY): Payer: Self-pay | Admitting: *Deleted

## 2014-08-26 DIAGNOSIS — N1330 Unspecified hydronephrosis: Secondary | ICD-10-CM | POA: Diagnosis not present

## 2014-08-26 DIAGNOSIS — M25519 Pain in unspecified shoulder: Secondary | ICD-10-CM | POA: Diagnosis not present

## 2014-08-26 DIAGNOSIS — E441 Mild protein-calorie malnutrition: Secondary | ICD-10-CM | POA: Insufficient documentation

## 2014-08-26 DIAGNOSIS — F0280 Dementia in other diseases classified elsewhere without behavioral disturbance: Secondary | ICD-10-CM | POA: Insufficient documentation

## 2014-08-26 DIAGNOSIS — I509 Heart failure, unspecified: Secondary | ICD-10-CM | POA: Diagnosis not present

## 2014-08-26 DIAGNOSIS — N200 Calculus of kidney: Principal | ICD-10-CM | POA: Insufficient documentation

## 2014-08-26 DIAGNOSIS — G308 Other Alzheimer's disease: Secondary | ICD-10-CM | POA: Insufficient documentation

## 2014-08-26 DIAGNOSIS — Z681 Body mass index (BMI) 19 or less, adult: Secondary | ICD-10-CM | POA: Insufficient documentation

## 2014-08-26 DIAGNOSIS — Z7901 Long term (current) use of anticoagulants: Secondary | ICD-10-CM | POA: Diagnosis not present

## 2014-08-26 DIAGNOSIS — M1389 Other specified arthritis, multiple sites: Secondary | ICD-10-CM | POA: Diagnosis not present

## 2014-08-26 DIAGNOSIS — F419 Anxiety disorder, unspecified: Secondary | ICD-10-CM | POA: Insufficient documentation

## 2014-08-26 DIAGNOSIS — G8929 Other chronic pain: Secondary | ICD-10-CM | POA: Diagnosis not present

## 2014-08-26 DIAGNOSIS — M8580 Other specified disorders of bone density and structure, unspecified site: Secondary | ICD-10-CM | POA: Diagnosis not present

## 2014-08-26 DIAGNOSIS — I4891 Unspecified atrial fibrillation: Secondary | ICD-10-CM | POA: Diagnosis not present

## 2014-08-26 DIAGNOSIS — I10 Essential (primary) hypertension: Secondary | ICD-10-CM | POA: Insufficient documentation

## 2014-08-26 DIAGNOSIS — Z936 Other artificial openings of urinary tract status: Secondary | ICD-10-CM | POA: Insufficient documentation

## 2014-08-26 DIAGNOSIS — E785 Hyperlipidemia, unspecified: Secondary | ICD-10-CM | POA: Diagnosis not present

## 2014-08-26 HISTORY — PX: HOLMIUM LASER APPLICATION: SHX5852

## 2014-08-26 HISTORY — PX: CYSTOSCOPY WITH RETROGRADE PYELOGRAM, URETEROSCOPY AND STENT PLACEMENT: SHX5789

## 2014-08-26 LAB — CREATININE, SERUM
CREATININE: 0.68 mg/dL (ref 0.50–1.10)
GFR calc Af Amer: >90 mL/min (ref >90)
GFR calc non Af Amer: 79 mL/min — ABNORMAL LOW (ref >90)

## 2014-08-26 LAB — CBC
HCT: 41 % (ref 36.0–46.0)
Hemoglobin: 13.3 g/dL (ref 12.0–15.0)
MCH: 29.5 pg (ref 26.0–34.0)
MCHC: 32.4 g/dL (ref 30.0–36.0)
MCV: 90.9 fL (ref 78.0–100.0)
Platelets: 177 10*3/uL (ref 150–400)
RBC: 4.51 MIL/uL (ref 3.87–5.11)
RDW: 14.1 % (ref 11.5–15.5)
WBC: 9.6 10*3/uL (ref 4.0–10.5)

## 2014-08-26 SURGERY — CYSTOURETEROSCOPY, WITH RETROGRADE PYELOGRAM AND STENT INSERTION
Anesthesia: General | Laterality: Left

## 2014-08-26 MED ORDER — ONDANSETRON HCL 4 MG/2ML IJ SOLN: 4.0000 mg | INTRAMUSCULAR | Status: DC | PRN

## 2014-08-26 MED ORDER — MEPERIDINE HCL 50 MG/ML IJ SOLN
6.2500 mg | INTRAMUSCULAR | Status: DC | PRN
  Administered 2014-08-26 (×2): 6.25 mg via INTRAVENOUS

## 2014-08-26 MED ORDER — CEPHALEXIN 500 MG PO CAPS: 500.0000 mg | ORAL_CAPSULE | Freq: Two times a day (BID) | ORAL | Status: DC

## 2014-08-26 MED ORDER — FENTANYL CITRATE 0.05 MG/ML IJ SOLN
INTRAMUSCULAR | Status: AC
  Filled 2014-08-26: qty 2

## 2014-08-26 MED ORDER — ONDANSETRON HCL 4 MG/2ML IJ SOLN
INTRAMUSCULAR | Status: DC | PRN
  Administered 2014-08-26: 4 mg via INTRAVENOUS

## 2014-08-26 MED ORDER — ENOXAPARIN SODIUM 30 MG/0.3ML ~~LOC~~ SOLN: 30.0000 mg | SUBCUTANEOUS | Status: DC

## 2014-08-26 MED ORDER — CEFAZOLIN SODIUM-DEXTROSE 2-3 GM-% IV SOLR
INTRAVENOUS | Status: AC
  Filled 2014-08-26: qty 50

## 2014-08-26 MED ORDER — LIDOCAINE HCL (CARDIAC) 20 MG/ML IV SOLN
INTRAVENOUS | Status: AC
  Filled 2014-08-26: qty 5

## 2014-08-26 MED ORDER — METOCLOPRAMIDE HCL 5 MG/ML IJ SOLN
5.0000 mg | Freq: Once | INTRAMUSCULAR | Status: AC
  Administered 2014-08-26: 5 mg via INTRAVENOUS

## 2014-08-26 MED ORDER — DOCUSATE SODIUM 100 MG PO CAPS
100.0000 mg | ORAL_CAPSULE | Freq: Two times a day (BID) | ORAL | Status: DC
  Administered 2014-08-26: 100 mg via ORAL
  Filled 2014-08-26 (×3): qty 1

## 2014-08-26 MED ORDER — LIDOCAINE HCL 1 % IJ SOLN
INTRAMUSCULAR | Status: DC | PRN
  Administered 2014-08-26: 75 mg via INTRADERMAL

## 2014-08-26 MED ORDER — CALCIUM CITRATE 950 (200 CA) MG PO TABS
200.0000 mg | ORAL_TABLET | Freq: Two times a day (BID) | ORAL | Status: DC
  Administered 2014-08-26: 200 mg via ORAL
  Filled 2014-08-26 (×3): qty 1

## 2014-08-26 MED ORDER — DIAZEPAM 5 MG PO TABS: 2.5000 mg | ORAL_TABLET | Freq: Two times a day (BID) | ORAL | Status: DC | PRN

## 2014-08-26 MED ORDER — FENTANYL CITRATE 0.05 MG/ML IJ SOLN
25.0000 ug | INTRAMUSCULAR | Status: DC | PRN
  Administered 2014-08-26: 50 ug via INTRAVENOUS

## 2014-08-26 MED ORDER — BOOST PO LIQD: 237.0000 mL | Freq: Every day | ORAL | Status: DC | PRN

## 2014-08-26 MED ORDER — BELLADONNA ALKALOIDS-OPIUM 16.2-60 MG RE SUPP
RECTAL | Status: AC
  Filled 2014-08-26: qty 1

## 2014-08-26 MED ORDER — FENTANYL CITRATE 0.05 MG/ML IJ SOLN
INTRAMUSCULAR | Status: DC | PRN
  Administered 2014-08-26: 25 ug via INTRAVENOUS
  Administered 2014-08-26 (×2): 12.5 ug via INTRAVENOUS
  Administered 2014-08-26: 50 ug via INTRAVENOUS

## 2014-08-26 MED ORDER — SODIUM CHLORIDE 0.9 % IR SOLN
Status: DC | PRN
  Administered 2014-08-26: 4000 mL via INTRAVESICAL

## 2014-08-26 MED ORDER — FLUCONAZOLE 100 MG PO TABS
100.0000 mg | ORAL_TABLET | Freq: Every day | ORAL | Status: DC
  Filled 2014-08-26: qty 1

## 2014-08-26 MED ORDER — LACTATED RINGERS IV SOLN
INTRAVENOUS | Status: DC
  Administered 2014-08-26: 12:00:00 via INTRAVENOUS

## 2014-08-26 MED ORDER — MAGNESIUM OXIDE 400 (241.3 MG) MG PO TABS
400.0000 mg | ORAL_TABLET | Freq: Two times a day (BID) | ORAL | Status: DC
  Administered 2014-08-26: 400 mg via ORAL
  Filled 2014-08-26 (×3): qty 1

## 2014-08-26 MED ORDER — INFLUENZA VAC SPLIT QUAD 0.5 ML IM SUSY
0.5000 mL | PREFILLED_SYRINGE | INTRAMUSCULAR | Status: DC
Start: 2014-08-27 — End: 2014-08-27
  Filled 2014-08-26 (×2): qty 0.5

## 2014-08-26 MED ORDER — CEFAZOLIN SODIUM-DEXTROSE 2-3 GM-% IV SOLR
2.0000 g | INTRAVENOUS | Status: AC
  Administered 2014-08-26: 2 g via INTRAVENOUS

## 2014-08-26 MED ORDER — IOHEXOL 300 MG/ML  SOLN
INTRAMUSCULAR | Status: DC | PRN
  Administered 2014-08-26: 30 mL via INTRAVENOUS

## 2014-08-26 MED ORDER — DONEPEZIL HCL 10 MG PO TABS
10.0000 mg | ORAL_TABLET | Freq: Every day | ORAL | Status: DC
  Administered 2014-08-26: 10 mg via ORAL
  Filled 2014-08-26 (×2): qty 1

## 2014-08-26 MED ORDER — PROPOFOL 10 MG/ML IV BOLUS
INTRAVENOUS | Status: AC
  Filled 2014-08-26: qty 20

## 2014-08-26 MED ORDER — DEXTROSE-NACL 5-0.45 % IV SOLN
INTRAVENOUS | Status: DC
  Administered 2014-08-26: 17:00:00 via INTRAVENOUS

## 2014-08-26 MED ORDER — ROSUVASTATIN CALCIUM 5 MG PO TABS
2.5000 mg | ORAL_TABLET | Freq: Every day | ORAL | Status: DC
  Administered 2014-08-26: 2.5 mg via ORAL
  Filled 2014-08-26 (×2): qty 0.5

## 2014-08-26 MED ORDER — VITAMIN C 250 MG PO TABS
250.0000 mg | ORAL_TABLET | Freq: Two times a day (BID) | ORAL | Status: DC
  Administered 2014-08-26: 250 mg via ORAL
  Filled 2014-08-26 (×3): qty 1

## 2014-08-26 MED ORDER — RIVAROXABAN 15 MG PO TABS
15.0000 mg | ORAL_TABLET | Freq: Every day | ORAL | Status: DC
  Administered 2014-08-26: 15 mg via ORAL
  Filled 2014-08-26 (×2): qty 1

## 2014-08-26 MED ORDER — METOCLOPRAMIDE HCL 5 MG/ML IJ SOLN
INTRAMUSCULAR | Status: AC
  Filled 2014-08-26: qty 2

## 2014-08-26 MED ORDER — MEPERIDINE HCL 50 MG/ML IJ SOLN
INTRAMUSCULAR | Status: AC
  Filled 2014-08-26: qty 1

## 2014-08-26 MED ORDER — HYDROCODONE-ACETAMINOPHEN 5-325 MG PO TABS: 1.0000 | ORAL_TABLET | ORAL | Status: DC | PRN

## 2014-08-26 MED ORDER — PHENYLEPHRINE HCL 10 MG/ML IJ SOLN
INTRAMUSCULAR | Status: DC | PRN
  Administered 2014-08-26: 80 ug via INTRAVENOUS

## 2014-08-26 MED ORDER — MIRTAZAPINE 15 MG PO TABS
15.0000 mg | ORAL_TABLET | Freq: Every day | ORAL | Status: DC
  Administered 2014-08-26: 15 mg via ORAL
  Filled 2014-08-26 (×2): qty 1

## 2014-08-26 MED ORDER — LIDOCAINE HCL 2 % EX GEL
CUTANEOUS | Status: AC
  Filled 2014-08-26: qty 10

## 2014-08-26 MED ORDER — ACYCLOVIR 400 MG PO TABS
400.0000 mg | ORAL_TABLET | Freq: Two times a day (BID) | ORAL | Status: DC
  Administered 2014-08-26: 400 mg via ORAL
  Filled 2014-08-26 (×3): qty 1

## 2014-08-26 MED ORDER — DIGOXIN 125 MCG PO TABS
0.1250 mg | ORAL_TABLET | ORAL | Status: DC
  Administered 2014-08-27: 0.125 mg via ORAL
  Filled 2014-08-26: qty 1

## 2014-08-26 MED ORDER — CEPHALEXIN 500 MG PO CAPS
500.0000 mg | ORAL_CAPSULE | Freq: Two times a day (BID) | ORAL | Status: DC
  Administered 2014-08-26: 500 mg via ORAL
  Filled 2014-08-26 (×3): qty 1

## 2014-08-26 MED ORDER — ACYCLOVIR 5 % EX OINT
TOPICAL_OINTMENT | Freq: Four times a day (QID) | CUTANEOUS | Status: DC
  Filled 2014-08-26: qty 30

## 2014-08-26 MED ORDER — PROPOFOL 10 MG/ML IV BOLUS
INTRAVENOUS | Status: DC | PRN
  Administered 2014-08-26: 120 mg via INTRAVENOUS

## 2014-08-26 MED ORDER — ONDANSETRON HCL 4 MG PO TABS: 4.0000 mg | ORAL_TABLET | Freq: Three times a day (TID) | ORAL | Status: DC | PRN

## 2014-08-26 MED ORDER — METOPROLOL TARTRATE 12.5 MG HALF TABLET
12.5000 mg | ORAL_TABLET | Freq: Every morning | ORAL | Status: DC
  Filled 2014-08-26: qty 1

## 2014-08-26 MED ORDER — ACETAMINOPHEN 325 MG PO TABS: 650.0000 mg | ORAL_TABLET | ORAL | Status: DC | PRN

## 2014-08-26 SURGICAL SUPPLY — 23 items
BAG URO CATCHER STRL LF (DRAPE) ×4 IMPLANT
BASKET ZERO TIP NITINOL 2.4FR (BASKET) ×4 IMPLANT
CATH INTERMIT  6FR 70CM (CATHETERS) ×4 IMPLANT
CLOTH BEACON ORANGE TIMEOUT ST (SAFETY) ×4 IMPLANT
DRAPE CAMERA CLOSED 9X96 (DRAPES) ×4 IMPLANT
FIBER LASER FLEXIVA 1000 (UROLOGICAL SUPPLIES) IMPLANT
FIBER LASER FLEXIVA 200 (UROLOGICAL SUPPLIES) ×4 IMPLANT
FIBER LASER FLEXIVA 365 (UROLOGICAL SUPPLIES) IMPLANT
FIBER LASER FLEXIVA 550 (UROLOGICAL SUPPLIES) IMPLANT
FIBER LASER TRAC TIP (UROLOGICAL SUPPLIES) ×4 IMPLANT
GLOVE BIOGEL M 8.0 STRL (GLOVE) ×4 IMPLANT
GOWN STRL REUS W/TWL XL LVL3 (GOWN DISPOSABLE) ×4 IMPLANT
GUIDEWIRE ANG ZIPWIRE 035X150 (WIRE) ×4 IMPLANT
GUIDEWIRE STR DUAL SENSOR (WIRE) ×4 IMPLANT
KIT BALLIN UROMAX 15FX10 (LABEL) ×2 IMPLANT
MANIFOLD NEPTUNE II (INSTRUMENTS) ×4 IMPLANT
PACK CYSTO (CUSTOM PROCEDURE TRAY) ×4 IMPLANT
SET HIGH PRES BAL DIL (LABEL) ×2
SHEATH ACCESS URETERAL 38CM (SHEATH) ×4 IMPLANT
STENT URET 6FRX24 CONTOUR (STENTS) ×8 IMPLANT
TUBING CONNECTING 10 (TUBING) ×3 IMPLANT
TUBING CONNECTING 10' (TUBING) ×1
WIRE COONS/BENSON .038X145CM (WIRE) IMPLANT

## 2014-08-26 NOTE — Anesthesia Postprocedure Evaluation (Signed)
  Anesthesia Post-op Note  Patient: Penny [name]  Procedure(s) Performed: Procedure(s) (LRB): CYSTOSCOPY WITH RETROGRADE PYELOGRAM, URETEROSCOPY , EXTRACTION OF STONES AND STENT PLACEMENT (Bilateral) HOLMIUM LASER APPLICATION (Left)  Patient Location: PACU  Anesthesia Type: General  Level of Consciousness: awake and alert   Airway and Oxygen Therapy: Patient Spontanous Breathing  Post-op Pain: mild  Post-op Assessment: Post-op Vital signs reviewed, Patient's Cardiovascular Status Stable, Respiratory Function Stable, Patent Airway and No signs of Nausea or vomiting  Last Vitals: 89, 19, 143/68   Post-op Vital Signs: stable   Complications:  Some shivering which was treated with small doses of demerol times two with resolution. Some minor nausea treated with Reglan 5 mg IV. Vital signs stable with no obvious signs of sepsis.

## 2014-08-26 NOTE — Interval H&P Note (Signed)
History and Physical Interval Note:  08/26/2014 12:12 PM  Penny [name]  has presented today for surgery, with the diagnosis of BILATERAL RENAL CALCULI  The various methods of treatment have been discussed with the patient and family. After consideration of risks, benefits and other options for treatment, the patient has consented to  Procedure(s): CYSTOSCOPY WITH RETROGRADE PYELOGRAM, URETEROSCOPY , EXTRACTION OF STONES AND STENT PLACEMENT (Bilateral) HOLMIUM LASER APPLICATION (Left) as a surgical intervention .  The patient's history has been reviewed, patient examined, no change in status, stable for surgery.  I have reviewed the patient's chart and labs.  Questions were answered to the patient's satisfaction.     Jorja Loa

## 2014-08-26 NOTE — Discharge Instructions (Signed)
POSTOPERATIVE CARE AFTER URETEROSCOPY ° °Stent management ° °*Stents are often left in after ureteroscopy and stone treatment. If left in, they often cause urinary frequency, urgency, occasional blood in the urine, as well as flank discomfort with urination. These are all expected issues, and should resolve after the stent is removed. °*Often times, a small thread is left on the end of the stent, and brought out through the urethra. If so, this is used to remove the stent, making it unnecessary to look in the bladder with a scope in the office to remove the stent. If a thread is left on, did not pull on it until instructed. ° °Diet ° °Once you have adequately recovered from anesthesia, you may gradually advance your diet, as tolerated, to your regular diet. ° °Activities ° °You may gradually increase your activities to your normal unrestricted level the day following your procedure. ° °Medications ° °You should resume all preoperative medications. If you are on aspirin-like compounds, you should not resume these until the blood clears from your urine. If given an antibiotic by the surgeon, take these until they are completed. You may also be given, if you have a stent, medications to decrease the urinary frequency and urgency. ° °Pain ° °After ureteroscopy, there may be some pain on the side of the scope. Take your pain medicine for this. Usually, this pain resolves within a day or 2. ° °Fever ° °Please report any fever over 100° to the doctor. ° °

## 2014-08-26 NOTE — Op Note (Signed)
Preoperative diagnosis: Bilateral renal calculi  Postoperative diagnosis: Same  Procedure:  1. Cystoscopy 2. Bilateral ureteroscopy and stone removal 3. Ureteroscopic laser lithotripsy 4. Bilateral ureteral stent placement (6 French by 24 cm contour stent without string) 5. Bilateral retrograde pyelography with interpretation  Surgeon: Lillette Boxer. Carsin Randazzo,  M.D.  Anesthesia: General  Complications: None  Intraoperative findings: Bilateral retrograde pyelography demonstrated a filling defect within the right lower pole collecting system in the left upper pole collecting system, consistent with the patient's known calculi without other abnormalities noted.  EBL: Minimal  Specimens: 1. Right calculus 2. Left renal calculus  Disposition of specimens: [facility] for stone analysis  Indication: Penny [name]  is a 80 y.o. patient with urolithiasis. She has a history of prior right renal calculi and is status post percutaneous management in the past. She was recently admitted with urosepsis and an obstructing left upper ureteral stone. This was managed with a percutaneous nephrostomy tube which currently is in place. Additionally, she has a duplicated system on the right, with a 1 cm stone in the lower pole pelvis. She presents for bilateral ureteroscopic management. After reviewing the management options for treatment, the patient's family elected to proceed with the above surgical procedure(s). We have discussed the potential benefits and risks of the procedure, side effects of the proposed treatment, the likelihood of the patient achieving the goals of the procedure, and any potential problems that might occur during the procedure or recuperation. Informed consent has been obtained.  Description of procedure:  The patient was taken to the operating room and general anesthesia was induced.  The patient was placed in the dorsal lithotomy position, prepped and draped in  the usual sterile fashion, and preoperative antibiotics were administered. A preoperative time-out was performed.   Cystourethroscopy was performed.  The bladder was then circumferentially examined. There was no evidence of tumors, trabeculations or foreign bodies. A solitary orifice was noted on the right. 2  were present on the left, but these were actually orifice is to the same ureter.  Attention then turned to the left ureteral orifice and a ureteral catheter was used to intubate the ureteral orifice.  Omnipaque contrast was injected through the ureteral catheter and a retrograde pyelogram was performed with findings as dictated above.  A 0.38 sensor guidewire was then advanced up the left ureter into the renal pelvis under fluoroscopic guidance.  A 12/14 Fr ureteral access sheath was then advance over the guide wire, but this could not be negotiated all the way to the pelvis. I then, over a guidewire and using fluoroscopic guidance, passed a 10 cm, 15 French balloon. The entire ureter was dilated with this balloon to a pressure of 20 atmospheres. I then passed the access sheath following balloon dilatation. The digital flexible ureteroscope was then advanced through the access sheath into the ureter next to the guidewire and the calculus was identified and was located in the left renal pelvis. The nephrostomy tube was seen curled right next to the stone.   The stone was then fragmented with the 200 micron holmium laser fiber on a setting of 0.5 J and frequency of 15 Hz.   All sizable stones were then removed with a zero tip nitinol basket.  Reinspection of the ureter/renal pelvis revealed no remaining visible stones or fragments of significant size.   The safety wire was then replaced and the access sheath removed.  The guidewire was backloaded through the cystoscope and a ureteral stent was advance  over the wire using Seldinger technique.  The stent was positioned appropriately under fluoroscopic  and cystoscopic guidance.  The wire was then removed with an adequate stent curl noted in the renal pelvis as well as in the bladder.  Attention was then turned to the right side. The solitary orifice was cannulated. Retrograde was performed. This revealed a common ureter distally, with a duplicated ureter above the pelvis. There was mild dilatation of both proximal ureters but no obstruction was noted. A filling defect was present in the right renal pelvis consistent with the known stone. I then advanced the open-ended catheter proximally in the ureter, and using a angled tip guidewire, negotiated guidewire into the lower pole collecting system. I then dilated the distal ureter with the 10 cm/15 French balloon to 20 atmospheres of pressure. The access sheath was then advanced up to the right renal pelvis/lower pole. The flexible scope was advanced through the access sheath, the stone identified, and fragmented into multiple smaller pieces with the laser fiber, again at a power of 0.5 J and frequency of 15 Hz. Multiple stone fragments were then removed with the Nitinol basket through the access sheath. A few tiny fragments remained, unable to be grasped with the basket. Inspection of all the calyces revealed no significant stones remaining. Again, a right double-J stent was placed in the lower pole moiety over a guidewire using fluoroscopic and cystoscopic guidance.  The bladder was then emptied and the procedure ended.  The patient appeared to tolerate the procedure well and without complications.  The patient was able to be awakened and transferred to the recovery unit in satisfactory condition. I then removed the patient's nephrostomy tube in the tube site was dressed with gauze.  At this point, the patient was awakened and taken to the PACU in stable condition. She tolerated the procedure well

## 2014-08-26 NOTE — Anesthesia Preprocedure Evaluation (Signed)
Anesthesia Evaluation  Patient identified by MRN, date of birth, ID band Patient awake    Reviewed: Allergy & Precautions, H&P , NPO status , Patient's Chart, lab work & pertinent test results  Airway Mallampati: II TM Distance: >3 FB Neck ROM: Full    Dental no notable dental hx.    Pulmonary former smoker,  breath sounds clear to auscultation  Pulmonary exam normal       Cardiovascular hypertension, Pt. on home beta blockers + angina +CHF + dysrhythmias Atrial Fibrillation Rhythm:Regular Rate:Normal     Neuro/Psych PSYCHIATRIC DISORDERS Anxiety Depression negative neurological ROS     GI/Hepatic negative GI ROS, Neg liver ROS,   Endo/Other  negative endocrine ROS  Renal/GU Renal disease  negative genitourinary   Musculoskeletal  (+) Arthritis -,   Abdominal   Peds negative pediatric ROS (+)  Hematology  (+) anemia ,   Anesthesia Other Findings   Reproductive/Obstetrics negative OB ROS                           Anesthesia Physical Anesthesia Plan  ASA: III  Anesthesia Plan: General   Post-op Pain Management:    Induction: Intravenous  Airway Management Planned: LMA  Additional Equipment:   Intra-op Plan:   Post-operative Plan: Extubation in OR  Informed Consent: I have reviewed the patients History and Physical, chart, labs and discussed the procedure including the risks, benefits and alternatives for the proposed anesthesia with the patient or authorized representative who has indicated his/her understanding and acceptance.   Dental advisory given  Plan Discussed with: CRNA  Anesthesia Plan Comments:         Anesthesia Quick Evaluation

## 2014-08-26 NOTE — Transfer of Care (Signed)
Immediate Anesthesia Transfer of Care Note  Patient: Penny [name]  Procedure(s) Performed: Procedure(s) (LRB): CYSTOSCOPY WITH RETROGRADE PYELOGRAM, URETEROSCOPY , EXTRACTION OF STONES AND STENT PLACEMENT (Bilateral) HOLMIUM LASER APPLICATION (Left)  Patient Location: PACU  Anesthesia Type: General  Level of Consciousness: sedated, patient cooperative and responds to stimulation  Airway & Oxygen Therapy: Patient Spontanous Breathing and Patient connected to face mask oxgen  Post-op Assessment: Report given to PACU RN and Post -op Vital signs reviewed and stable  Post vital signs: Reviewed and stable  Complications: No apparent anesthesia complications

## 2014-08-27 ENCOUNTER — Encounter (HOSPITAL_COMMUNITY): Payer: Self-pay | Admitting: Urology

## 2014-08-27 DIAGNOSIS — N200 Calculus of kidney: Secondary | ICD-10-CM | POA: Diagnosis not present

## 2014-08-27 MED ORDER — CEPHALEXIN 500 MG PO CAPS: 500.0000 mg | ORAL_CAPSULE | Freq: Two times a day (BID) | ORAL | Status: DC

## 2014-08-27 NOTE — Progress Notes (Signed)
UR completed 

## 2014-08-27 NOTE — Discharge Summary (Signed)
Patient ID: Penny [name] MRN: [id number] DOB/AGE: 27-Nov-1928 80 y.o.  Admit date: 08/26/2014 Discharge date: 08/27/2014  Primary Care Physician:  Marton Redwood, MD  Discharge Diagnoses:   Bilateral renal calculi  Consults:  None     Discharge Medications:   Medication List    STOP taking these medications       ondansetron 4 MG tablet  Commonly known as:  ZOFRAN      TAKE these medications       acetaminophen 500 MG tablet  Commonly known as:  TYLENOL  Take 500 mg by mouth 2 (two) times daily as needed for mild pain or moderate pain.     acyclovir 400 MG tablet  Commonly known as:  ZOVIRAX  Take 400 mg by mouth 2 (two) times daily.     acyclovir ointment 5 %  Commonly known as:  ZOVIRAX  Apply topically 4 (four) times daily.     CALCITRATE PO  Take 1 tablet by mouth daily.     cephALEXin 500 MG capsule  Commonly known as:  KEFLEX  Take 1 capsule (500 mg total) by mouth 2 (two) times daily.     diazepam 5 MG tablet  Commonly known as:  VALIUM  Take 2.5-5 mg by mouth every 12 (twelve) hours as needed for anxiety.     digoxin 0.125 MG tablet  Commonly known as:  LANOXIN  Take 0.125 mg by mouth 3 (three) times a week. 1 pill on Monday, Wednesday, and Friday     donepezil 10 MG tablet  Commonly known as:  ARICEPT  Take 10 mg by mouth at bedtime.     FISH OIL PO  Take 1 capsule by mouth daily.     fluconazole 100 MG tablet  Commonly known as:  DIFLUCAN  Take 1 tablet (100 mg total) by mouth daily.     lactose free nutrition Liqd  Take 237 mLs by mouth daily as needed (for nutrition).     magnesium oxide 400 MG tablet  Commonly known as:  MAG-OX  Take 400 mg by mouth 2 (two) times daily. Completed on 08/16/2014     metoprolol tartrate 25 MG tablet  Commonly known as:  LOPRESSOR  Take 0.5 tablets (12.5 mg total) by mouth every morning.     mirtazapine 15 MG tablet  Commonly known as:  REMERON  Take 15 mg by mouth at bedtime.     multivitamin with  minerals Tabs tablet  Take 1 tablet by mouth daily.     OVER THE COUNTER MEDICATION  Apply 1 application topically See admin instructions. Two to three times daily to lips. (Lysine)     rosuvastatin 5 MG tablet  Commonly known as:  CRESTOR  Take 2.5 mg by mouth at bedtime.     VITAMIN C PO  Take 1 tablet by mouth daily.     XARELTO 20 MG Tabs tablet  Generic drug:  rivaroxaban  Take 20 mg by mouth at bedtime.         Significant Diagnostic Studies:  Dg Chest 2 View  08/18/2014   CLINICAL DATA:  Preop cystoscopy  EXAM: CHEST  2 VIEW  COMPARISON:  07/13/2014  FINDINGS: The heart size appears normal. No pleural effusion identified. There is no airspace consolidation identified. Lungs are hyperinflated and there are coarsened interstitial markings noted bilaterally. The bones appear osteopenic. Vertebroplasty has been performed within the upper thoracic spine. There is a right shoulder- arthroplasty device.  IMPRESSION: 1. No acute  cardiopulmonary abnormalities.   Electronically Signed   By: Kerby Moors M.D.   On: 08/18/2014 14:30    Brief H and P: For complete details please refer to admission H and P, but in brief the patient is admitted for ureteroscopic management of bilateral renal calculi. She has an indwelling left nephrostomy tube placed during her recent admission for left hydronephrosis secondary to an obstructing left UPJ stone.  Hospital Course:  Active Problems:   Renal calculus, bilateral  the patient underwent a lateral ureteroscopic stone management on 08/26/2014. The procedure went well, and her postoperative recovery was uneventful, especially considering her dementia. She was discharged on postoperative day #1.  Day of Discharge BP 116/53  Pulse 74  Temp(Src) 98.8 F (37.1 C) (Oral)  Resp 20  Ht 5\' 5"  (1.651 m)  Wt 51.256 kg (113 lb)  BMI 18.80 kg/m2  SpO2 98%  Results for orders placed during the hospital encounter of 08/26/14 (from the past 24 hour(s))   CBC     Status: None   Collection Time    08/26/14  5:22 PM      Result Value Ref Range   WBC 9.6  4.0 - 10.5 K/uL   RBC 4.51  3.87 - 5.11 MIL/uL   Hemoglobin 13.3  12.0 - 15.0 g/dL   HCT 41.0  36.0 - 46.0 %   MCV 90.9  78.0 - 100.0 fL   MCH 29.5  26.0 - 34.0 pg   MCHC 32.4  30.0 - 36.0 g/dL   RDW 14.1  11.5 - 15.5 %   Platelets 177  150 - 400 K/uL  CREATININE, SERUM     Status: Abnormal   Collection Time    08/26/14  5:22 PM      Result Value Ref Range   Creatinine, Ser 0.68  0.50 - 1.10 mg/dL   GFR calc non Af Amer 79 (*) >90 mL/min   GFR calc Af Amer >90  >90 mL/min    Physical Exam: General: Alert and awake oriented x3 not in any acute distress. HEENT: anicteric sclera, pupils reactive to light and accommodation CVS: S1-S2 clear no murmur rubs or gallops Chest: clear to auscultation bilaterally, no wheezing rales or rhonchi Abdomen: soft nontender, nondistended, normal bowel sounds, no organomegaly Extremities: no cyanosis, clubbing or edema noted bilaterally Neuro: Cranial nerves II-XII intact, no focal neurological deficits  Disposition:  Home  Diet:  No restrictions  Activity:  Gradually increase   Disposition and Follow-up:    In the office for cystoscopy and stent extraction  TESTS THAT NEED FOLLOW-UP  None  DISCHARGE FOLLOW-UP     Follow-up Information   Follow up with Jorja Loa, MD. (as scheduled)    Specialty:  Urology   Contact information:   [place] [address] [phone]       Time spent on Discharge:  15 minutes  Signed: Jorja Loa 08/27/2014, 6:59 AM

## 2014-10-07 ENCOUNTER — Encounter (HOSPITAL_COMMUNITY): Payer: Self-pay | Admitting: *Deleted

## 2014-10-07 ENCOUNTER — Observation Stay (HOSPITAL_COMMUNITY)
Admission: AD | Admit: 2014-10-07 | Discharge: 2014-10-08 | Disposition: A | Discharge status: Home / Self Care | Payer: Medicare Other | Source: Ambulatory Visit | Attending: Urology | Admitting: Urology

## 2014-10-07 ENCOUNTER — Ambulatory Visit: Payer: Self-pay | Admitting: Urology

## 2014-10-07 ENCOUNTER — Other Ambulatory Visit: Payer: Self-pay | Admitting: Urology

## 2014-10-07 DIAGNOSIS — Z79899 Other long term (current) drug therapy: Secondary | ICD-10-CM | POA: Insufficient documentation

## 2014-10-07 DIAGNOSIS — N201 Calculus of ureter: Secondary | ICD-10-CM | POA: Diagnosis not present

## 2014-10-07 DIAGNOSIS — Z87891 Personal history of nicotine dependence: Secondary | ICD-10-CM | POA: Diagnosis not present

## 2014-10-07 DIAGNOSIS — F419 Anxiety disorder, unspecified: Secondary | ICD-10-CM | POA: Insufficient documentation

## 2014-10-07 DIAGNOSIS — Z466 Encounter for fitting and adjustment of urinary device: Secondary | ICD-10-CM | POA: Diagnosis present

## 2014-10-07 DIAGNOSIS — G309 Alzheimer's disease, unspecified: Secondary | ICD-10-CM | POA: Diagnosis not present

## 2014-10-07 DIAGNOSIS — Z882 Allergy status to sulfonamides status: Secondary | ICD-10-CM | POA: Diagnosis not present

## 2014-10-07 DIAGNOSIS — I509 Heart failure, unspecified: Secondary | ICD-10-CM | POA: Insufficient documentation

## 2014-10-07 DIAGNOSIS — M8580 Other specified disorders of bone density and structure, unspecified site: Secondary | ICD-10-CM | POA: Insufficient documentation

## 2014-10-07 DIAGNOSIS — F0280 Dementia in other diseases classified elsewhere without behavioral disturbance: Secondary | ICD-10-CM | POA: Diagnosis not present

## 2014-10-07 DIAGNOSIS — F329 Major depressive disorder, single episode, unspecified: Secondary | ICD-10-CM | POA: Diagnosis not present

## 2014-10-07 DIAGNOSIS — I4891 Unspecified atrial fibrillation: Secondary | ICD-10-CM | POA: Insufficient documentation

## 2014-10-07 DIAGNOSIS — R0789 Other chest pain: Secondary | ICD-10-CM

## 2014-10-07 DIAGNOSIS — R079 Chest pain, unspecified: Secondary | ICD-10-CM

## 2014-10-07 DIAGNOSIS — I10 Essential (primary) hypertension: Secondary | ICD-10-CM | POA: Insufficient documentation

## 2014-10-07 DIAGNOSIS — Z888 Allergy status to other drugs, medicaments and biological substances status: Secondary | ICD-10-CM | POA: Insufficient documentation

## 2014-10-07 DIAGNOSIS — E785 Hyperlipidemia, unspecified: Secondary | ICD-10-CM | POA: Insufficient documentation

## 2014-10-07 DIAGNOSIS — Z7901 Long term (current) use of anticoagulants: Secondary | ICD-10-CM | POA: Insufficient documentation

## 2014-10-07 DIAGNOSIS — N200 Calculus of kidney: Secondary | ICD-10-CM

## 2014-10-07 LAB — BASIC METABOLIC PANEL
Anion gap: 11 (ref 5–15)
BUN: 15 mg/dL (ref 6–23)
CO2: 26 meq/L (ref 19–32)
Calcium: 9.9 mg/dL (ref 8.4–10.5)
Chloride: 108 mEq/L (ref 96–112)
Creatinine, Ser: 0.85 mg/dL (ref 0.50–1.10)
GFR calc Af Amer: 71 mL/min — ABNORMAL LOW (ref >90)
GFR calc non Af Amer: 62 mL/min — ABNORMAL LOW (ref >90)
GLUCOSE: 94 mg/dL (ref 70–99)
POTASSIUM: 4.4 meq/L (ref 3.7–5.3)
SODIUM: 145 meq/L (ref 137–147)

## 2014-10-07 LAB — CBC WITH DIFFERENTIAL/PLATELET
Basophils Absolute: 0 10*3/uL (ref 0.0–0.1)
Basophils Relative: 1 % (ref 0–1)
EOS PCT: 3 % (ref 0–5)
Eosinophils Absolute: 0.2 10*3/uL (ref 0.0–0.7)
HEMATOCRIT: 32.2 % — AB (ref 36.0–46.0)
Hemoglobin: 10.1 g/dL — ABNORMAL LOW (ref 12.0–15.0)
LYMPHS PCT: 22 % (ref 12–46)
Lymphs Abs: 1.3 10*3/uL (ref 0.7–4.0)
MCH: 29 pg (ref 26.0–34.0)
MCHC: 31.4 g/dL (ref 30.0–36.0)
MCV: 92.5 fL (ref 78.0–100.0)
MONO ABS: 0.5 10*3/uL (ref 0.1–1.0)
Monocytes Relative: 9 % (ref 3–12)
Neutro Abs: 3.9 10*3/uL (ref 1.7–7.7)
Neutrophils Relative %: 65 % (ref 43–77)
Platelets: 210 10*3/uL (ref 150–400)
RBC: 3.48 MIL/uL — ABNORMAL LOW (ref 3.87–5.11)
RDW: 13.5 % (ref 11.5–15.5)
WBC: 6 10*3/uL (ref 4.0–10.5)

## 2014-10-07 MED ORDER — ACETAMINOPHEN 325 MG PO TABS: 650.0000 mg | ORAL_TABLET | ORAL | Status: DC | PRN

## 2014-10-07 MED ORDER — HYDROCODONE-ACETAMINOPHEN 5-325 MG PO TABS: 1.0000 | ORAL_TABLET | ORAL | Status: DC | PRN

## 2014-10-07 MED ORDER — METOPROLOL TARTRATE 25 MG PO TABS
12.5000 mg | ORAL_TABLET | Freq: Every morning | ORAL | Status: DC
  Administered 2014-10-08: 12.5 mg via ORAL
  Filled 2014-10-07: qty 1

## 2014-10-07 MED ORDER — DONEPEZIL HCL 10 MG PO TABS
10.0000 mg | ORAL_TABLET | Freq: Every day | ORAL | Status: DC
  Administered 2014-10-07: 10 mg via ORAL
  Filled 2014-10-07 (×2): qty 1

## 2014-10-07 MED ORDER — ONDANSETRON HCL 4 MG/2ML IJ SOLN: 4.0000 mg | INTRAMUSCULAR | Status: DC | PRN

## 2014-10-07 MED ORDER — RIVAROXABAN 20 MG PO TABS
20.0000 mg | ORAL_TABLET | Freq: Every day | ORAL | Status: DC
  Filled 2014-10-07 (×2): qty 1

## 2014-10-07 MED ORDER — DOCUSATE SODIUM 100 MG PO CAPS
100.0000 mg | ORAL_CAPSULE | Freq: Two times a day (BID) | ORAL | Status: DC
  Administered 2014-10-07: 100 mg via ORAL
  Filled 2014-10-07 (×3): qty 1

## 2014-10-07 MED ORDER — CEFAZOLIN SODIUM 1-5 GM-% IV SOLN
1.0000 g | Freq: Three times a day (TID) | INTRAVENOUS | Status: DC
Start: 2014-10-07 — End: 2014-10-08
  Administered 2014-10-07 – 2014-10-08 (×3): 1 g via INTRAVENOUS
  Filled 2014-10-07 (×4): qty 50

## 2014-10-07 MED ORDER — MIRTAZAPINE 15 MG PO TABS
15.0000 mg | ORAL_TABLET | Freq: Every day | ORAL | Status: DC
  Administered 2014-10-07: 15 mg via ORAL
  Filled 2014-10-07 (×2): qty 1

## 2014-10-07 NOTE — H&P (Signed)
H&P  Chief Complaint: kidney stones/retained stent  History of Present Illness: Penny [name] is a 80 y.o. year old female it was seen in the office earlier today following ureteroscopy with stent placementon 08/26/2014.  She had bilateral renal/upper ureteral stones treated.  She was still had her stents removed 2-3 weeks ago, but she was treated for pyuria, and her second appointment for stent removal was canceled due to the patient having soiled herself.  Cystoscopy and stent extraction was performed on 1 stent today, but the second stent was retained secondary to encrustations.  It was left coming out of her bladder.  Because she was not nothing by mouth, she is admitted at this time for hospitalization/IV antibiotics/pain management with cystoscopy, possible ureteroscopy and removal of her retained stent tomorrow.  Past Medical History  Diagnosis Date  . Long-term (current) use of anticoagulants   . Chronic shoulder pain   . Heart palpitations     rare  . Memory problem     "a little short term and long term memory loss" (03/31/2014)  . Depression   . Hyperlipidemia   . Dyslipidemia   . Seasonal allergies   . Anxiety   . Hypertension   . CHF (congestive heart failure)   . Atrial fibrillation   . Humerus fracture 03/31/2014    "fell and broke left arm"  . Kidney stones   . Anginal pain   . Blood in urine     "chronic" (03/31/2014)  . Arthritis     "joints" (03/31/2014)  . Osteopenia     "severe" (03/31/2014)  . Syncope and collapse 03/31/2014  . Alzheimer's dementia 03/31/2014  . Dysrhythmia     atrail fib   . Hx of transfusion of packed red blood cells     Past Surgical History  Procedure Laterality Date  . Ureteral stent placement Bilateral     for stenosis; "they've been removed"  . Removal of growth  2007    growth under kidney removed and possibly kindey stones removed  . Knee arthroscopy Right 2011  . Total shoulder arthroplasty Right 10/2007  . Nephrolithotomy  Right 06/04/2013    Procedure: NEPHROLITHOTOMY PERCUTANEOUS;  Surgeon: Franchot Gallo, MD;  Location: [facility];  Service: Urology;  Laterality: Right;  . Appendectomy  ~ 1953  . Bladder suspension      w/hysterectomy  . Cardioversion  2006  . Meniscus repair Left 07/2000  . Cystoscopy with retrograde pyelogram, ureteroscopy and stent placement Bilateral 08/26/2014    Procedure: CYSTOSCOPY WITH RETROGRADE PYELOGRAM, URETEROSCOPY , EXTRACTION OF STONES AND STENT PLACEMENT;  Surgeon: Jorja Loa, MD;  Location: [facility];  Service: Urology;  Laterality: Bilateral;  . Holmium laser application Left 16/11/958    Procedure: HOLMIUM LASER APPLICATION;  Surgeon: Jorja Loa, MD;  Location: [facility];  Service: Urology;  Laterality: Left;    Home Medications:  Medications Prior to Admission  Medication Sig Dispense Refill  . acetaminophen (TYLENOL) 500 MG tablet Take 500 mg by mouth 2 (two) times daily as needed for mild pain.    [name] acyclovir (ZOVIRAX) 400 MG tablet Take 400 mg by mouth 2 (two) times daily as needed (for fever blister).     [name] acyclovir ointment (ZOVIRAX) 5 % Apply topically 4 (four) times daily. (Patient taking differently: Apply 1 application topically 4 (four) times daily as needed (for fever blisters). ) 30 g 0  . Ascorbic Acid (VITAMIN C PO) Take 1 tablet by mouth daily.     [name]  Calcium Citrate (CALCITRATE PO) Take 1 tablet by mouth daily.    . diazepam (VALIUM) 5 MG tablet Take 2.5-5 mg by mouth every 12 (twelve) hours as needed for anxiety.    . digoxin (LANOXIN) 0.125 MG tablet Take 0.125 mg by mouth 3 (three) times a week. 1 pill on Monday, Wednesday, and Friday    . donepezil (ARICEPT) 10 MG tablet Take 10 mg by mouth at bedtime.     . lactose free nutrition (BOOST) LIQD Take 237 mLs by mouth daily as needed (for nutrition).    . metoprolol tartrate (LOPRESSOR) 25 MG tablet Take 0.5 tablets (12.5 mg total) by mouth every morning. 45 tablet 0  . mirtazapine (REMERON) 15  MG tablet Take 15 mg by mouth at bedtime.    . Multiple Vitamin (MULTIVITAMIN WITH MINERALS) TABS Take 1 tablet by mouth daily.    . Omega-3 Fatty Acids (FISH OIL PO) Take 1 capsule by mouth daily.    . Rivaroxaban (XARELTO) 20 MG TABS tablet Take 20 mg by mouth at bedtime.  30 tablet   . rosuvastatin (CRESTOR) 5 MG tablet Take 2.5 mg by mouth at bedtime.    . cephALEXin (KEFLEX) 500 MG capsule Take 1 capsule (500 mg total) by mouth 2 (two) times daily. (Patient not taking: Reported on 10/07/2014) 10 capsule 0  . fluconazole (DIFLUCAN) 100 MG tablet Take 1 tablet (100 mg total) by mouth daily. (Patient not taking: Reported on 10/07/2014) 30 tablet 0  . [DISCONTINUED] OVER THE COUNTER MEDICATION Apply 1 application topically See admin instructions. Two to three times daily to lips. (Lysine)      Allergies:  Allergies  Allergen Reactions  . Sulfa Antibiotics Rash  . Zocor [Simvastatin] Other (See Comments)    Family History  Problem Relation Age of Onset  . Heart disease Mother     Social History:  reports that she quit smoking about 33 years ago. Her smoking use included Cigarettes. She has a 1.25 pack-year smoking history. She has never used smokeless tobacco. She reports that she does not drink alcohol or use illicit drugs.  ROS: A complete review of systems was performed.  All systems are negative except for pertinent findings as noted.  Physical Exam:  Vital signs in last 24 hours: Temp:  [98.6 F (37 C)] 98.6 F (37 C) (11/19 1623) Pulse Rate:  [74] 74 (11/19 1623) Resp:  [18] 18 (11/19 1623) BP: (132)/(61) 132/61 mmHg (11/19 1623) SpO2:  [98 %] 98 % (11/19 1623) Weight:  [55.7 kg (122 lb 12.7 oz)] 55.7 kg (122 lb 12.7 oz) (11/19 1623) General:  Alert and oriented,she is in mild distress, but was comfortable by the time she left the office HEENT: Normocephalic, atraumatic Neck: No JVD or lymphadenopathy Cardiovascular: Regular rate and rhythm Lungs: Clear  bilaterally Abdomen: Soft, nontender, nondistended, no abdominal masses Back: No CVA tenderness Extremities: No edema Neurologic: she has cognitive dysfunction  Laboratory Data:  Results for orders placed or performed during the hospital encounter of 10/07/14 (from the past 24 hour(s))  Basic metabolic panel     Status: Abnormal   Collection Time: 10/07/14  4:18 PM  Result Value Ref Range   Sodium 145 137 - 147 mEq/L   Potassium 4.4 3.7 - 5.3 mEq/L   Chloride 108 96 - 112 mEq/L   CO2 26 19 - 32 mEq/L   Glucose, Bld 94 70 - 99 mg/dL   BUN 15 6 - 23 mg/dL   Creatinine, Ser 0.85 0.50 -  1.10 mg/dL   Calcium 9.9 8.4 - 10.5 mg/dL   GFR calc non Af Amer 62 (L) >90 mL/min   GFR calc Af Amer 71 (L) >90 mL/min   Anion gap 11 5 - 15  CBC WITH DIFFERENTIAL     Status: Abnormal   Collection Time: 10/07/14  4:18 PM  Result Value Ref Range   WBC 6.0 4.0 - 10.5 K/uL   RBC 3.48 (L) 3.87 - 5.11 MIL/uL   Hemoglobin 10.1 (L) 12.0 - 15.0 g/dL   HCT 32.2 (L) 36.0 - 46.0 %   MCV 92.5 78.0 - 100.0 fL   MCH 29.0 26.0 - 34.0 pg   MCHC 31.4 30.0 - 36.0 g/dL   RDW 13.5 11.5 - 15.5 %   Platelets 210 150 - 400 K/uL   Neutrophils Relative % 65 43 - 77 %   Neutro Abs 3.9 1.7 - 7.7 K/uL   Lymphocytes Relative 22 12 - 46 %   Lymphs Abs 1.3 0.7 - 4.0 K/uL   Monocytes Relative 9 3 - 12 %   Monocytes Absolute 0.5 0.1 - 1.0 K/uL   Eosinophils Relative 3 0 - 5 %   Eosinophils Absolute 0.2 0.0 - 0.7 K/uL   Basophils Relative 1 0 - 1 %   Basophils Absolute 0.0 0.0 - 0.1 K/uL   No results found for this or any previous visit (from the past 240 hour(s)). Creatinine:  Recent Labs  10/07/14 1618  CREATININE 0.85    Radiologic Imaging: No results found.  Impression/Assessment:  Retained ureteral stent secondary to encrustation after cystoscopy and stent placement last month  Plan:  Admission for pain management, IV antibiotic management and eventual cystoscopy and stent removal with ureteroscopic  help.  Jorja Loa 10/07/2014, 5:33 PM  Lillette Boxer. Hudson Lehmkuhl MD

## 2014-10-07 NOTE — Plan of Care (Signed)
Problem: Phase I Progression Outcomes Goal: Pain controlled with appropriate interventions Outcome: Completed/Met Date Met:  10/07/14 Goal: OOB as tolerated unless otherwise ordered Outcome: Completed/Met Date Met:  10/07/14

## 2014-10-08 ENCOUNTER — Observation Stay (HOSPITAL_COMMUNITY): Payer: Medicare Other | Admitting: Anesthesiology

## 2014-10-08 ENCOUNTER — Encounter (HOSPITAL_COMMUNITY): Payer: Self-pay | Admitting: Certified Registered"

## 2014-10-08 ENCOUNTER — Observation Stay (HOSPITAL_COMMUNITY): Payer: Medicare Other

## 2014-10-08 ENCOUNTER — Encounter (HOSPITAL_COMMUNITY)
Admission: AD | Disposition: A | Discharge status: Home / Self Care | Payer: Self-pay | Source: Ambulatory Visit | Attending: Urology

## 2014-10-08 DIAGNOSIS — I10 Essential (primary) hypertension: Secondary | ICD-10-CM | POA: Diagnosis not present

## 2014-10-08 DIAGNOSIS — Z466 Encounter for fitting and adjustment of urinary device: Secondary | ICD-10-CM | POA: Diagnosis not present

## 2014-10-08 DIAGNOSIS — N201 Calculus of ureter: Secondary | ICD-10-CM | POA: Diagnosis not present

## 2014-10-08 DIAGNOSIS — F329 Major depressive disorder, single episode, unspecified: Secondary | ICD-10-CM | POA: Diagnosis not present

## 2014-10-08 HISTORY — PX: CYSTOSCOPY/RETROGRADE/URETEROSCOPY/STONE EXTRACTION WITH BASKET: SHX5317

## 2014-10-08 SURGERY — CYSTOSCOPY, WITH CALCULUS REMOVAL USING BASKET
Anesthesia: General | Laterality: Right

## 2014-10-08 MED ORDER — ONDANSETRON HCL 4 MG/2ML IJ SOLN
INTRAMUSCULAR | Status: DC | PRN
  Administered 2014-10-08: 4 mg via INTRAVENOUS

## 2014-10-08 MED ORDER — ONDANSETRON HCL 4 MG/2ML IJ SOLN
INTRAMUSCULAR | Status: AC
  Filled 2014-10-08: qty 2

## 2014-10-08 MED ORDER — PROPOFOL 10 MG/ML IV BOLUS
INTRAVENOUS | Status: AC
  Filled 2014-10-08: qty 20

## 2014-10-08 MED ORDER — MEPERIDINE HCL 50 MG/ML IJ SOLN
6.2500 mg | INTRAMUSCULAR | Status: DC | PRN
  Administered 2014-10-08: 12.5 mg via INTRAVENOUS

## 2014-10-08 MED ORDER — DEXAMETHASONE SODIUM PHOSPHATE 10 MG/ML IJ SOLN
INTRAMUSCULAR | Status: AC
  Filled 2014-10-08: qty 1

## 2014-10-08 MED ORDER — HYDROMORPHONE HCL 1 MG/ML IJ SOLN: 0.2500 mg | INTRAMUSCULAR | Status: DC | PRN

## 2014-10-08 MED ORDER — METOPROLOL TARTRATE 12.5 MG HALF TABLET
12.5000 mg | ORAL_TABLET | Freq: Once | ORAL | Status: AC
  Administered 2014-10-08: 12.5 mg via ORAL
  Filled 2014-10-08: qty 1

## 2014-10-08 MED ORDER — FENTANYL CITRATE 0.05 MG/ML IJ SOLN
INTRAMUSCULAR | Status: DC | PRN
  Administered 2014-10-08 (×2): 50 ug via INTRAVENOUS

## 2014-10-08 MED ORDER — LACTATED RINGERS IV SOLN
INTRAVENOUS | Status: DC
  Administered 2014-10-08: 1000 mL via INTRAVENOUS

## 2014-10-08 MED ORDER — OXYCODONE HCL 5 MG/5ML PO SOLN
5.0000 mg | Freq: Once | ORAL | Status: DC | PRN
  Filled 2014-10-08: qty 5

## 2014-10-08 MED ORDER — SODIUM CHLORIDE 0.9 % IR SOLN
Status: DC | PRN
  Administered 2014-10-08: 1000 mL

## 2014-10-08 MED ORDER — IOHEXOL 300 MG/ML  SOLN
INTRAMUSCULAR | Status: DC | PRN
  Administered 2014-10-08: 25 mL

## 2014-10-08 MED ORDER — CIPROFLOXACIN HCL 250 MG PO TABS: 250.0000 mg | ORAL_TABLET | Freq: Two times a day (BID) | ORAL | Status: DC

## 2014-10-08 MED ORDER — FENTANYL CITRATE 0.05 MG/ML IJ SOLN
INTRAMUSCULAR | Status: AC
  Filled 2014-10-08: qty 2

## 2014-10-08 MED ORDER — LIDOCAINE HCL (CARDIAC) 20 MG/ML IV SOLN
INTRAVENOUS | Status: AC
Start: 2014-10-08 — End: 2014-10-08
  Filled 2014-10-08: qty 5

## 2014-10-08 MED ORDER — PROPOFOL 10 MG/ML IV BOLUS: INTRAVENOUS | Status: DC | PRN

## 2014-10-08 MED ORDER — MIDAZOLAM HCL 2 MG/2ML IJ SOLN
INTRAMUSCULAR | Status: AC
  Filled 2014-10-08: qty 2

## 2014-10-08 MED ORDER — LIDOCAINE HCL (CARDIAC) 20 MG/ML IV SOLN: INTRAVENOUS | Status: DC | PRN

## 2014-10-08 MED ORDER — LIDOCAINE HCL (CARDIAC) 20 MG/ML IV SOLN
INTRAVENOUS | Status: DC | PRN
  Administered 2014-10-08: 50 mg via INTRAVENOUS

## 2014-10-08 MED ORDER — FENTANYL CITRATE 0.05 MG/ML IJ SOLN: INTRAMUSCULAR | Status: DC | PRN

## 2014-10-08 MED ORDER — OXYCODONE HCL 5 MG PO TABS
5.0000 mg | ORAL_TABLET | Freq: Once | ORAL | Status: DC | PRN
Start: 2014-10-08 — End: 2014-10-08

## 2014-10-08 MED ORDER — PROPOFOL 10 MG/ML IV BOLUS
INTRAVENOUS | Status: DC | PRN
  Administered 2014-10-08: 150 mg via INTRAVENOUS

## 2014-10-08 MED ORDER — PROMETHAZINE HCL 25 MG/ML IJ SOLN: 6.2500 mg | INTRAMUSCULAR | Status: DC | PRN

## 2014-10-08 MED ORDER — LEVOFLOXACIN 250 MG PO TABS
250.0000 mg | ORAL_TABLET | Freq: Every day | ORAL | Status: DC
Start: 2014-10-08 — End: 2014-11-15

## 2014-10-08 MED ORDER — MEPERIDINE HCL 50 MG/ML IJ SOLN
INTRAMUSCULAR | Status: AC
  Filled 2014-10-08: qty 1

## 2014-10-08 SURGICAL SUPPLY — 19 items
BAG URO CATCHER STRL LF (DRAPE) ×3 IMPLANT
BASKET ZERO TIP NITINOL 2.4FR (BASKET) ×3 IMPLANT
CATH INTERMIT  6FR 70CM (CATHETERS) ×3 IMPLANT
CLOTH BEACON ORANGE TIMEOUT ST (SAFETY) ×3 IMPLANT
DRAPE CAMERA CLOSED 9X96 (DRAPES) ×3 IMPLANT
FIBER LASER FLEXIVA 365 (UROLOGICAL SUPPLIES) ×3 IMPLANT
GLOVE BIOGEL M STRL SZ7.5 (GLOVE) ×3 IMPLANT
GOWN STRL REUS W/ TWL XL LVL3 (GOWN DISPOSABLE) ×1 IMPLANT
GOWN STRL REUS W/TWL LRG LVL3 (GOWN DISPOSABLE) ×3 IMPLANT
GOWN STRL REUS W/TWL XL LVL3 (GOWN DISPOSABLE) ×2
GUIDEWIRE ANG ZIPWIRE 038X150 (WIRE) IMPLANT
GUIDEWIRE STR DUAL SENSOR (WIRE) ×3 IMPLANT
IV NS 1000ML (IV SOLUTION) ×2
IV NS 1000ML BAXH (IV SOLUTION) ×1 IMPLANT
MANIFOLD NEPTUNE II (INSTRUMENTS) ×3 IMPLANT
PACK CYSTO (CUSTOM PROCEDURE TRAY) ×3 IMPLANT
STENT URET 6FRX24 CONTOUR (STENTS) ×3 IMPLANT
TUBING CONNECTING 10 (TUBING) ×2 IMPLANT
TUBING CONNECTING 10' (TUBING) ×1

## 2014-10-08 NOTE — Progress Notes (Signed)
Subjective: Patient reports that she is comfortable and slept well.  She has no significant incontinence.  Objective: Vital signs in last 24 hours: Temp:  [97.7 F (36.5 C)-98.6 F (37 C)] 97.7 F (36.5 C) (11/20 0457) Pulse Rate:  [74-93] 86 (11/20 0457) Resp:  [16-18] 16 (11/20 0457) BP: (112-132)/(61-69) 112/69 mmHg (11/20 0457) SpO2:  [98 %] 98 % (11/20 0457) Weight:  [55.7 kg (122 lb 12.7 oz)] 55.7 kg (122 lb 12.7 oz) (11/19 1623)  Intake/Output from previous day: 11/19 0701 - 11/20 0700 In: 170 [P.O.:120; IV Piggyback:50] Out: -  Intake/Output this shift:    Physical Exam:  Constitutional: Vital signs reviewed. WD WN in NAD   Eyes: PERRL, No scleral icterus.   Pulmonary/Chest: Normal effort Abdominal: Soft. Non-tender, non-distended, bowel sounds are normal, no masses, organomegaly, or guarding present.    Lab Results:  Recent Labs  10/07/14 1618  HGB 10.1*  HCT 32.2*   BMET  Recent Labs  10/07/14 1618  NA 145  K 4.4  CL 108  CO2 26  GLUCOSE 94  BUN 15  CREATININE 0.85  CALCIUM 9.9   No results for input(s): LABPT, INR in the last 72 hours. No results for input(s): LABURIN in the last 72 hours. Results for orders placed or performed during the hospital encounter of 07/11/14  Urine culture     Status: None   Collection Time: 07/11/14 10:17 PM  Result Value Ref Range Status   Specimen Description URINE, CATHETERIZED  Final   Special Requests NONE  Final   Culture  Setup Time   Final    07/12/2014 01:52 Performed at [facility]   Final    >=100,000 COLONIES/ML Performed at [facility]   Culture   Final    [place] Performed at [facility]   Report Status 07/13/2014 FINAL  Final   Organism ID, Bacteria MORGANELLA MORGANII  Final      Susceptibility   Morganella morganii - MIC*    AMPICILLIN >=32 RESISTANT Resistant     CEFAZOLIN >=64 RESISTANT Resistant     CEFTRIAXONE <=1 SENSITIVE  Sensitive     CIPROFLOXACIN <=0.25 SENSITIVE Sensitive     GENTAMICIN <=1 SENSITIVE Sensitive     LEVOFLOXACIN <=0.12 SENSITIVE Sensitive     NITROFURANTOIN 128 RESISTANT Resistant     TOBRAMYCIN <=1 SENSITIVE Sensitive     TRIMETH/SULFA <=20 SENSITIVE Sensitive     PIP/TAZO <=4 SENSITIVE Sensitive     * MORGANELLA MORGANII  Blood Culture (routine x 2)     Status: None   Collection Time: 07/11/14 10:37 PM  Result Value Ref Range Status   Specimen Description BLOOD LEFT ARM  Final   Special Requests BOTTLES DRAWN AEROBIC AND ANAEROBIC [facility] EACH  Final   Culture  Setup Time   Final    07/12/2014 01:48 Performed at [facility]   Culture   Final    [facility] MORGANII Note: Gram Stain Report Called to,Read Back By and Verified With: TERESA [email or web address]  ON [id number] BY [facility] Performed at [facility]   Report Status 07/14/2014 FINAL  Final   Organism ID, Bacteria MORGANELLA MORGANII  Final      Susceptibility   Morganella morganii - MIC*    AMPICILLIN >=32 RESISTANT Resistant     AMPICILLIN/SULBACTAM >=32 RESISTANT Resistant     CEFAZOLIN >=64 RESISTANT Resistant     CEFEPIME <=1 SENSITIVE Sensitive     CEFTAZIDIME <=1 SENSITIVE Sensitive  CEFTRIAXONE <=1 SENSITIVE Sensitive     CIPROFLOXACIN <=0.25 SENSITIVE Sensitive     GENTAMICIN <=1 SENSITIVE Sensitive     IMIPENEM 4 SENSITIVE Sensitive     PIP/TAZO <=4 SENSITIVE Sensitive     TOBRAMYCIN <=1 SENSITIVE Sensitive     TRIMETH/SULFA <=20 SENSITIVE Sensitive     * MORGANELLA MORGANII  MRSA PCR Screening     Status: None   Collection Time: 07/12/14  4:00 AM  Result Value Ref Range Status   MRSA by PCR NEGATIVE NEGATIVE Final    Comment:        The GeneXpert MRSA Assay (FDA approved for NASAL specimens only), is one component of a comprehensive MRSA colonization surveillance program. It is not intended to diagnose MRSA infection nor to guide or monitor treatment for MRSA infections.  Culture, blood (routine  x 2)     Status: None   Collection Time: 07/12/14  4:45 AM  Result Value Ref Range Status   Specimen Description BLOOD RIGHT HAND  Final   Special Requests BOTTLES DRAWN AEROBIC ONLY 3CC  Final   Culture  Setup Time   Final    07/12/2014 08:41 Performed at [facility]   Culture   Final    NO GROWTH 5 DAYS Performed at [facility]   Report Status 07/18/2014 FINAL  Final  Culture, respiratory (NON-Expectorated)     Status: None   Collection Time: 07/12/14  8:06 AM  Result Value Ref Range Status   Specimen Description TRACHEAL ASPIRATE  Final   Special Requests NONE  Final   Gram Stain   Final    RARE WBC PRESENT, PREDOMINANTLY MONONUCLEAR NO SQUAMOUS EPITHELIAL CELLS SEEN NO ORGANISMS SEEN Performed at [facility]   Culture   Final    Non-Pathogenic Oropharyngeal-type Flora Isolated. Performed at [facility]   Report Status 07/14/2014 FINAL  Final  Urine culture     Status: None   Collection Time: 07/12/14 10:20 AM  Result Value Ref Range Status   Specimen Description URINE, CATHETERIZED  Final   Special Requests NONE  Final   Culture  Setup Time   Final    07/12/2014 11:06 Performed at [place] Performed at [facility]  Final   Culture NO GROWTH Performed at [facility]  Final   Report Status 07/13/2014 FINAL  Final  Culture, routine-abscess     Status: None   Collection Time: 07/12/14  1:30 PM  Result Value Ref Range Status   Specimen Description ABSCESS BLADDER  Final   Special Requests NONE  Final   Gram Stain   Final    MODERATE WBC PRESENT, PREDOMINANTLY PMN NO SQUAMOUS EPITHELIAL CELLS SEEN NO ORGANISMS SEEN Performed at [facility]   Culture   Final    MODERATE MORGANELLA MORGANII ABUNDANT ENTEROCOCCUS SPECIES Performed at [facility]   Report Status 07/16/2014 FINAL  Final   Organism ID, Bacteria MORGANELLA MORGANII  Final   Organism ID, Bacteria  ENTEROCOCCUS SPECIES  Final      Susceptibility   Morganella morganii - MIC*    AMPICILLIN >=32 RESISTANT Resistant     AMPICILLIN/SULBACTAM >=32 RESISTANT Resistant     CEFAZOLIN >=64 RESISTANT Resistant     CEFEPIME <=1 SENSITIVE Sensitive     CEFTAZIDIME <=1 SENSITIVE Sensitive     CEFTRIAXONE <=1 SENSITIVE Sensitive     CIPROFLOXACIN <=0.25 SENSITIVE Sensitive     GENTAMICIN <=1 SENSITIVE  Sensitive     IMIPENEM 4 SENSITIVE Sensitive     PIP/TAZO <=4 SENSITIVE Sensitive     TOBRAMYCIN <=1 SENSITIVE Sensitive     TRIMETH/SULFA* <=20 SENSITIVE Sensitive      * SET UP TIME:  [id number]    * MODERATE MORGANELLA MORGANII   Enterococcus species - MIC*    VANCOMYCIN 1 SENSITIVE Sensitive     AMPICILLIN <=2 SENSITIVE Sensitive     * ABUNDANT ENTEROCOCCUS SPECIES  Culture, blood (routine x 2)     Status: None   Collection Time: 07/16/14  3:00 PM  Result Value Ref Range Status   Specimen Description BLOOD RIGHT HAND  Final   Special Requests BOTTLES DRAWN AEROBIC ONLY 5CC  Final   Culture  Setup Time   Final    07/16/2014 19:17 Performed at [facility]   Culture   Final    NO GROWTH 5 DAYS Performed at [facility]   Report Status 07/22/2014 FINAL  Final  Culture, blood (routine x 2)     Status: None   Collection Time: 07/16/14  3:15 PM  Result Value Ref Range Status   Specimen Description BLOOD RIGHT HAND  Final   Special Requests BOTTLES DRAWN AEROBIC ONLY 3CC  Final   Culture  Setup Time   Final    07/16/2014 19:17 Performed at [facility]   Final    NO GROWTH 5 DAYS Performed at [facility]   Report Status 07/22/2014 FINAL  Final    Studies/Results: No results found.  Assessment/Plan:   Retained double-J stent.  She is scheduled for ureteroscopic management of this today.  I discussed the procedure with the patient and her family.  They agree to proceed.   LOS: 1 day   Franchot Gallo M 10/08/2014, 8:05  AM

## 2014-10-08 NOTE — Transfer of Care (Signed)
Immediate Anesthesia Transfer of Care Note  Patient: Penny [name]  Procedure(s) Performed: Procedure(s): CYSTOSCOPY, retrograde pyelogram,URETEROSCOPY, laser treatment of calculus, stent exchange (Right)  Patient Location: PACU  Anesthesia Type:General  Level of Consciousness: awake, alert  and oriented  Airway & Oxygen Therapy: Patient Spontanous Breathing and Patient connected to face mask oxygen  Post-op Assessment: Report given to PACU RN and Post -op Vital signs reviewed and stable  Post vital signs: Reviewed and stable  Complications: No apparent anesthesia complications

## 2014-10-08 NOTE — Progress Notes (Signed)
UR completed 

## 2014-10-08 NOTE — Discharge Instructions (Signed)
POSTOPERATIVE CARE AFTER URETEROSCOPY  Stent management  *Stents are often left in after ureteroscopy and stone treatment. If left in, they often cause urinary frequency, urgency, occasional blood in the urine, as well as flank discomfort with urination. These are all expected issues, and should resolve after the stent is removed. *Often times, a small thread is left on the end of the stent, and brought out through the urethra. If so, this is used to remove the stent, making it unnecessary to look in the bladder with a scope in the office to remove the stent. If a thread is left on, did not pull on it until instructed. If the stent has not come out by Monday morning, pull the string to remove the stent.  Diet  Once you have adequately recovered from anesthesia, you may gradually advance your diet, as tolerated, to your regular diet.  Activities  You may gradually increase your activities to your normal unrestricted level the day following your procedure.  Medications  You should resume all preoperative medications. If you are on aspirin-like compounds, you should not resume these until the blood clears from your urine. If given an antibiotic by the surgeon, take these until they are completed. You may also be given, if you have a stent, medications to decrease the urinary frequency and urgency.  Pain  After ureteroscopy, there may be some pain on the side of the scope. Take your pain medicine for this. Usually, this pain resolves within a day or 2.  Fever  Please report any fever over 100 to the doctor.

## 2014-10-08 NOTE — Anesthesia Postprocedure Evaluation (Signed)
Anesthesia Post Note  Patient: Penny [name]  Procedure(s) Performed: Procedure(s) (LRB): CYSTOSCOPY, retrograde pyelogram,URETEROSCOPY, laser treatment of calculus, stent exchange (Right)  Anesthesia type: General  Patient location: PACU  Post pain: Pain level controlled  Post assessment: Post-op Vital signs reviewed  Last Vitals: BP 123/72 mmHg  Pulse 74  Temp(Src) 36.7 C (Oral)  Resp 15  Ht 5\' 5"  (1.651 m)  Wt 122 lb 12.7 oz (55.7 kg)  BMI 20.43 kg/m2  SpO2 98%  Post vital signs: Reviewed  Level of consciousness: sedated  Complications: No apparent anesthesia complications

## 2014-10-08 NOTE — Op Note (Signed)
Preoperative diagnosis: Retained ureteral stent  Postoperative diagnosis: Same   Procedure: Cystoscopy, right retrograde ureteropyelogram with interpretive fluoroscopy, right ureteroscopy, laser fragmentation of encrusted ureteral stent, removal of right ureteral stent (complicated), right double-J stent placement    Surgeon: Lillette Boxer. Janifer Gieselman, M.D.   Anesthesia: Gen.   Complications: None  Specimen(s): Stone fragments, disposed  Drain(s): 24 cm x 6 French contour stent with tether  Indications: 80 year old female with recent intervention for bilateral ureteral calculi. She has a retained right ureteral stent with encrustations there was unable to be removed yesterday. She was admitted with her stent partially exposed yesterday. She has been receiving antibiotic intravenously. She presents at this time for endoscopic management of a retained right ureteral stent.    Technique and findings: The patient was identified in the holding area. She received preoperative IV Ancef-this is been given since yesterday on a every 8 hour schedule. She was taken to the operating room where general anesthetic was administered. She was placed in the dorsolithotomy position. Proper timeout was performed.  I then placed a 22 French panendoscope into her bladder. The stone encrusted stent was seen coming from the right ureteral orifice. I performed a retrograde ureteropyelogram with a 6 [place] open-ended catheter. This revealed contrast going into her right lower pole ureteral moiety. There was nothing going up the upper pole ureteral moiety. There was no dilatation of the right pyelocalyceal system, the lower pole aspect. I attempted to navigate a guidewire up the involved duplicated ureter with the stent in place. This was impossible to do because of obstruction from the stent.  I then removed the cystoscope. Under direct vision, I passed a 6 French ureteroscope through her ureteral orifice, up beside the  stent, and into the ureteral moiety that contained the stent. There was significant encrustation of the stent. I used the beak of the scope to break up as much of the stone as I could. I then used the laser fiber to fragment the remaining encrusted stent. The stent was then, with minimal difficulty, removed. The laser fiber was removed. I removed the significant encrustations of the stent with the Nitinol basket. 3-4 fair size fragments were removed easily. I then passed the ureteroscope retrograde, no further fragments were seen in either of the ureteral moieties.  At this point, over guidewire there was placed in the lower pole ureteral moiety, I passed a 6 French 24 cm contour stent, leaving the string on. The bladder was drained. The string was taped to the patient's right thigh. The patient was awakened and taken to PACU in stable condition. She tolerated the procedure well.

## 2014-10-08 NOTE — Anesthesia Preprocedure Evaluation (Addendum)
Anesthesia Evaluation  Patient identified by MRN, date of birth, ID band Patient awake    Reviewed: Allergy & Precautions, H&P , NPO status , Patient's Chart, lab work & pertinent test results, reviewed documented beta blocker date and time   Airway Mallampati: II  TM Distance: >3 FB Neck ROM: Full    Dental no notable dental hx.    Pulmonary former smoker,  breath sounds clear to auscultation  Pulmonary exam normal       Cardiovascular hypertension, Pt. on home beta blockers + angina +CHF + dysrhythmias Atrial Fibrillation Rhythm:Regular Rate:Normal     Neuro/Psych PSYCHIATRIC DISORDERS Anxiety Depression negative neurological ROS     GI/Hepatic negative GI ROS, Neg liver ROS,   Endo/Other  negative endocrine ROS  Renal/GU Renal disease     Musculoskeletal  (+) Arthritis -,   Abdominal   Peds  Hematology  (+) anemia ,   Anesthesia Other Findings   Reproductive/Obstetrics negative OB ROS                            Anesthesia Physical  Anesthesia Plan  ASA: III  Anesthesia Plan: General   Post-op Pain Management:    Induction: Intravenous  Airway Management Planned: LMA  Additional Equipment:   Intra-op Plan:   Post-operative Plan: Extubation in OR  Informed Consent: I have reviewed the patients History and Physical, chart, labs and discussed the procedure including the risks, benefits and alternatives for the proposed anesthesia with the patient or authorized representative who has indicated his/her understanding and acceptance.   Dental advisory given  Plan Discussed with: CRNA  Anesthesia Plan Comments:         Anesthesia Quick Evaluation

## 2014-10-11 ENCOUNTER — Encounter (HOSPITAL_COMMUNITY): Payer: Self-pay | Admitting: Urology

## 2014-11-03 NOTE — Discharge Summary (Signed)
Patient ID: Penny [name] MRN: [id number] DOB/AGE: 1929-05-05 80 y.o.  Admit date: 10/07/2014 Discharge date: 11/03/2014  Primary Care Physician:  Marton Redwood, MD  Discharge Diagnoses:  Retained/encrusted ureteral stent on right  Consults:  None   Discharge Medications:   Medication List    STOP taking these medications        cephALEXin 500 MG capsule  Commonly known as:  KEFLEX     fluconazole 100 MG tablet  Commonly known as:  DIFLUCAN      TAKE these medications        acetaminophen 500 MG tablet  Commonly known as:  TYLENOL  Take 500 mg by mouth 2 (two) times daily as needed for mild pain.     acyclovir 400 MG tablet  Commonly known as:  ZOVIRAX  Take 400 mg by mouth 2 (two) times daily as needed (for fever blister).     acyclovir ointment 5 %  Commonly known as:  ZOVIRAX  Apply topically 4 (four) times daily.     CALCITRATE PO  Take 1 tablet by mouth daily.     diazepam 5 MG tablet  Commonly known as:  VALIUM  Take 2.5-5 mg by mouth every 12 (twelve) hours as needed for anxiety.     digoxin 0.125 MG tablet  Commonly known as:  LANOXIN  Take 0.125 mg by mouth 3 (three) times a week. 1 pill on Monday, Wednesday, and Friday     donepezil 10 MG tablet  Commonly known as:  ARICEPT  Take 10 mg by mouth at bedtime.     FISH OIL PO  Take 1 capsule by mouth daily.     lactose free nutrition Liqd  Take 237 mLs by mouth daily as needed (for nutrition).     levofloxacin 250 MG tablet  Commonly known as:  LEVAQUIN  Take 1 tablet (250 mg total) by mouth daily.     metoprolol tartrate 25 MG tablet  Commonly known as:  LOPRESSOR  Take 0.5 tablets (12.5 mg total) by mouth every morning.     mirtazapine 15 MG tablet  Commonly known as:  REMERON  Take 15 mg by mouth at bedtime.     multivitamin with minerals Tabs tablet  Take 1 tablet by mouth daily.     rosuvastatin 5 MG tablet  Commonly known as:  CRESTOR  Take 2.5 mg by mouth at bedtime.     VITAMIN C PO  Take 1 tablet by mouth daily.     XARELTO 20 MG Tabs tablet  Generic drug:  rivaroxaban  Take 20 mg by mouth at bedtime.         Significant Diagnostic Studies:  X-ray Chest Pa And Lateral  10/08/2014   CLINICAL DATA:  Patient confused. Chest pain of on certain etiology.  EXAM: CHEST  2 VIEW  COMPARISON:  08/18/2014.  FINDINGS: Cardiac silhouette is mildly enlarged. No mediastinal or hilar masses or convincing adenopathy.  Lungs are hyperexpanded. No lung consolidation or edema. No pleural effusion or pneumothorax.  Bony thorax is demineralized. Right shoulder prosthesis is well aligned.  No change from the prior study.  IMPRESSION: 1. No acute cardiopulmonary disease.   Electronically Signed   By: Lajean Manes M.D.   On: 10/08/2014 08:40    Brief H and P: For complete details please refer to admission H and P, but in brief the patient was admitted for ureteroscopic management of a retained ureteral stent secondary to encrustation. She previously undergone bilateral  ureteroscopy and stent placement. At the office visit following the procedure, left stent was easily removed, right stent was not, as it was encrusted and could not be removed cystoscopically. She was admitted for ureteroscopic management of that.  Hospital Course:  Active Problems:   Kidney stones  the patient was admitted to the hospital, and on the second hospital day she underwent ureteroscopic management, with laser, of a retained/encrusted right ureteral stent. A temporary stent was left in with the string. She was doing well if the procedure and was discharged home the day of the procedure in adequate condition.  Day of Discharge BP 123/72 mmHg  Pulse 74  Temp(Src) 98 F (36.7 C) (Oral)  Resp 15  Ht 5\' 5"  (1.651 m)  Wt 55.7 kg (122 lb 12.7 oz)  BMI 20.43 kg/m2  SpO2 98%  No results found for this or any previous visit (from the past 24 hour(s)).  Physical Exam: General: Alert and awake oriented  x3 not in any acute distress. HEENT: anicteric sclera, pupils reactive to light and accommodation CVS: S1-S2 clear no murmur rubs or gallops Chest: clear to auscultation bilaterally, no wheezing rales or rhonchi Abdomen: soft nontender, nondistended, normal bowel sounds, no organomegaly Extremities: no cyanosis, clubbing or edema noted bilaterally Neuro: Cranial nerves II-XII intact, no focal neurological deficits  Disposition:  Home  Diet:  No restrictions  Activity:  No restrictions   Disposition and Follow-up:     Discharge Instructions    Care order/instruction    Complete by:  As directed   The patient may be discharged after she is awake, alert, and tolerating diet.     Discharge patient    Complete by:  As directed             Instructions were given to patient  TESTS THAT NEED FOLLOW-UP None  DISCHARGE FOLLOW-UP Follow-up Information    Follow up with Jorja Loa, MD.   Specialty:  Urology   Why:  We will call   Contact information:   [place] [address] [phone]       Time spent on Discharge: 15 minutes  Signed: Jorja Loa 11/03/2014, 7:45 PM

## 2014-11-15 ENCOUNTER — Encounter: Payer: Self-pay | Admitting: Cardiology

## 2014-11-15 ENCOUNTER — Ambulatory Visit (INDEPENDENT_AMBULATORY_CARE_PROVIDER_SITE_OTHER): Payer: Medicare Other | Admitting: Cardiology

## 2014-11-15 VITALS — BP 112/62 | HR 72 | Ht 65.0 in | Wt 126.0 lb

## 2014-11-15 DIAGNOSIS — I10 Essential (primary) hypertension: Secondary | ICD-10-CM

## 2014-11-15 DIAGNOSIS — I482 Chronic atrial fibrillation, unspecified: Secondary | ICD-10-CM

## 2014-11-15 DIAGNOSIS — R55 Syncope and collapse: Secondary | ICD-10-CM

## 2014-11-15 NOTE — Patient Instructions (Signed)
Your physician recommends that you continue on your current medications as directed. Please refer to the Current Medication list given to you today.  Your physician wants you to follow-up in: 6 MONTH OV /EKG You will receive a reminder letter in the mail two months in advance. If you don't receive a letter, please call our office to schedule the follow-up appointment.  

## 2014-11-15 NOTE — Progress Notes (Signed)
Frederico Hamman Date of Birth:  1929-03-21 [place] [address] [place], [place]  [address] [phone]        Fax   [phone]   History of Present Illness:  This pleasant 80 year old woman is seen for a scheduled followup office visit. She has a history of chronic atrial fibrillation. She also has a past history of syncope and collapse which occurred at about the beginning of July 2013. She was found to have low blood pressure following that episode and her lisinopril was stopped. Subsequently the patient did well until 01/12/13 when she had another episode of syncope at a restaurant and was hospitalized briefly. She subsequently wore a 30 day event monitor. The event monitor did not show any cause for her syncope. She was in constant atrial fibrillation with a controlled ventricular response and no excessive bradycardia or tachycardia was seen. The patient reports that she has been feeling better. Her daughter who is a Marine scientist also states that she thinks that her mother has been doing better since we cut her back on her digoxin to just a small dose of 0.125 Monday Wednesday and Friday only.  The patient was hospitalized with a kidney stone which was removed on 06/04/13. She spent 3 nights in the hospital postop. Her anticoagulation was held appropriately prior to the surgery. She is no longer on Coumadin. Her PCP switched her to Xarelto and I agree with that decision. She has recently been diagnosed with osteoporosis and is being treated for this by Dr. Brigitte Pulse. Since her last she saw her she fell and fractured her left proximal humerus.  Dr. Veverly Fells is her orthopedist.  She was treated without surgery.  She wore a sling for a while and is improving slowly.  She now has reasonably good function in her left arm. Since we last saw her she was readmitted to [facility] in October 2015 with fever and urosepsis.  Current Outpatient Prescriptions  Medication Sig Dispense  Refill  . acetaminophen (TYLENOL) 500 MG tablet Take 500 mg by mouth 2 (two) times daily as needed for mild pain.    . Ascorbic Acid (VITAMIN C PO) Take 1 tablet by mouth daily.     . Calcium Citrate (CALCITRATE PO) Take 1 tablet by mouth daily.    . diazepam (VALIUM) 5 MG tablet Take 2.5-5 mg by mouth every 12 (twelve) hours as needed for anxiety.    . digoxin (LANOXIN) 0.125 MG tablet Take 0.125 mg by mouth 3 (three) times a week. 1 pill on Monday, Wednesday, and Friday    . donepezil (ARICEPT) 10 MG tablet Take 10 mg by mouth at bedtime.     . lactose free nutrition (BOOST) LIQD Take 237 mLs by mouth daily as needed (for nutrition).    . metoprolol tartrate (LOPRESSOR) 25 MG tablet Take 0.5 tablets (12.5 mg total) by mouth every morning. 45 tablet 0  . mirtazapine (REMERON) 15 MG tablet Take 15 mg by mouth at bedtime.    . Multiple Vitamin (MULTIVITAMIN WITH MINERALS) TABS Take 1 tablet by mouth daily.    . Omega-3 Fatty Acids (FISH OIL PO) Take 1 capsule by mouth daily.    . Rivaroxaban (XARELTO) 20 MG TABS tablet Take 20 mg by mouth at bedtime.  30 tablet   . rosuvastatin (CRESTOR) 5 MG tablet Take 2.5 mg by mouth at bedtime.     No current facility-administered medications for this visit.    Allergies  Allergen Reactions  . Sulfa Antibiotics Rash  . Zocor [Simvastatin] Other (See Comments)    Patient Active Problem List   Diagnosis Date Noted  . Atrial fibrillation 02/26/2011    Priority: High  . Malaise and fatigue 03/23/2011    Priority: Medium  . Kidney stones 10/07/2014  . Renal calculus, bilateral 08/26/2014  . Severe sepsis(995.92) 07/15/2014  . Bacteremia due to Gram-negative bacteria 07/14/2014  . Septic shock 07/12/2014  . Acute respiratory failure 07/12/2014  . Hypokalemia 07/12/2014  . Pyelonephritis 07/12/2014  . Left humeral fracture 05/19/2014  . Dementia 04/05/2014  . Acute blood loss anemia 04/05/2014  . Hypertension 04/05/2014  . Depression 04/05/2014    . Humerus fracture 03/31/2014    Class: Acute  . Syncope 03/31/2014  . Bradycardia 01/14/2013  . UTI (urinary tract infection) 01/13/2013    Class: Acute  . Syncope and collapse 05/28/2012  . Weight loss, unintentional 03/04/2012  . Inguinal hernia 09/25/2011  . Dizziness - light-headed 06/27/2011  . Right bundle branch block 03/23/2011  . Hypercholesterolemia 03/23/2011  . Long term current use of anticoagulant 02/26/2011    History  Smoking status  . Former Smoker -- 0.25 packs/day for 5 years  . Types: Cigarettes  . Quit date: 07/20/1981  Smokeless tobacco  . Never Used    History  Alcohol Use No    Family History  Problem Relation Age of Onset  . Heart disease Mother     Review of Systems: Constitutional: no fever chills diaphoresis or fatigue or change in weight.  Head and neck: no hearing loss, no epistaxis, no photophobia or visual disturbance. Respiratory: No cough, shortness of breath or wheezing. Cardiovascular: No chest pain peripheral edema, palpitations. Gastrointestinal: No abdominal distention, no abdominal pain, no change in bowel habits hematochezia or melena. Genitourinary: No dysuria, no frequency, no urgency, no nocturia. Musculoskeletal:No arthralgias, no back pain, no gait disturbance or myalgias. Neurological: No dizziness, no headaches, no numbness, no seizures, no syncope, no weakness, no tremors. Hematologic: No lymphadenopathy, no easy bruising. Psychiatric: No confusion, no hallucinations, no sleep disturbance.    Physical Exam: Filed Vitals:   11/15/14 1556  BP: 112/62  Pulse: 72   the general appearance reveals a well-developed elderly woman in no acute distress.The head and neck exam reveals pupils equal and reactive.  Extraocular movements are full.  There is no scleral icterus.  The mouth and pharynx are normal.  The neck is supple.  The carotids reveal no bruits.  The jugular venous pressure is normal.  The  thyroid is not  enlarged.  There is no lymphadenopathy.  The chest is clear to percussion and auscultation.  There are no rales or rhonchi.  Expansion of the chest is symmetrical.  The precordium is quiet.  The pulse is irregularly irregular The first heart sound is normal.  The second heart sound is physiologically split.  There is a grade 1/6 apical systolic murmur.  There is no abnormal lift or heave.  The abdomen is soft and nontender.  The bowel sounds are normal.  The liver and spleen are not enlarged.  There are no abdominal masses.  There are no abdominal bruits.  Extremities reveal good pedal pulses.  There is no phlebitis or edema.  There is no cyanosis or clubbing.  Strength is normal and symmetrical in all extremities.  There is no lateralizing weakness.  There are no sensory deficits.  The skin is warm and dry.  There is no rash.  EKG today  shows atrial fibrillation with controlled ventricular response of 79.  Since 04/01/14, no significant change.  The right bundle branch block is old.  Assessment / Plan: 1. permanent atrial fibrillation on rate control and Xarelto.  She has not been having any symptoms related to her atrial fibrillation.  She has not had any thromboembolic episodes.  No bleeding from the Xarelto. 2. Dementia 3. dyslipidemia followed by PCP  Plan: Continue same medication.  Recheck in 6 months for office visit

## 2014-12-16 ENCOUNTER — Other Ambulatory Visit: Payer: Self-pay | Admitting: Cardiology

## 2015-01-06 IMAGING — CT CT RENAL STONE PROTOCOL
2 of 4 series · 16 of 46 positions shown, 18 images · non-contrast
Comparison: [DATE]

CLINICAL DATA: Abdominal pain, bilateral flank pain, UTI and fever.

EXAM:
CT RENAL STONE PROTOCOL
TECHNIQUE: Multidetector CT imaging of the abdomen and pelvis was performed
following the standard protocol without intravenous contrast

[Series 2: stone study 5.0 i30f 1 · axial · 0.68mm/px · z∈[-408,+24]mm · 13 of 95 slices shown, 15 images]
[im 4/95  soft-tissue]
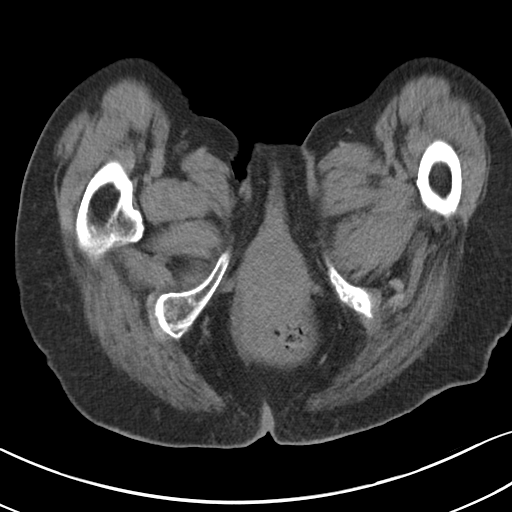
[im 4/95  bone]
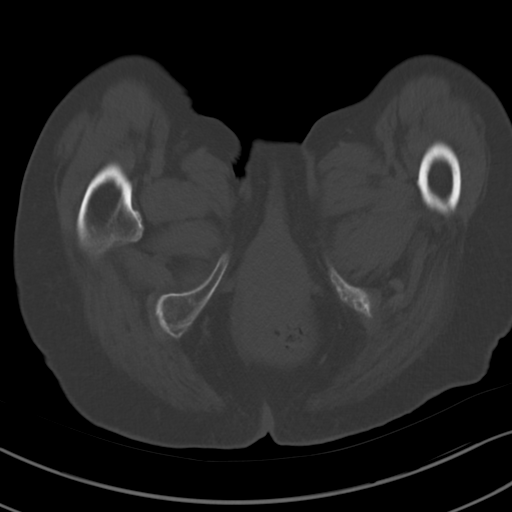
[im 12/95  soft-tissue]
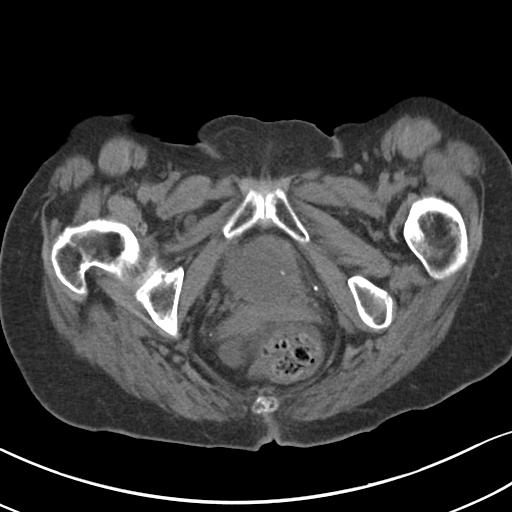
[im 20/95  soft-tissue]
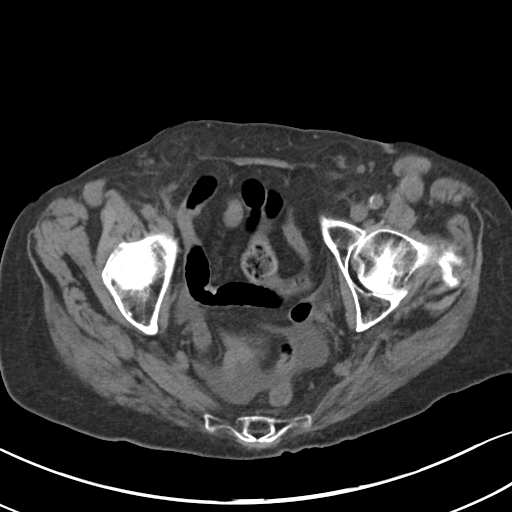
[im 28/95  soft-tissue]
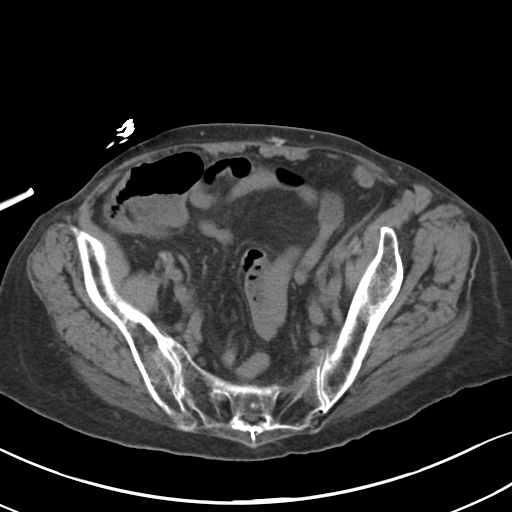
[im 32/95  soft-tissue]
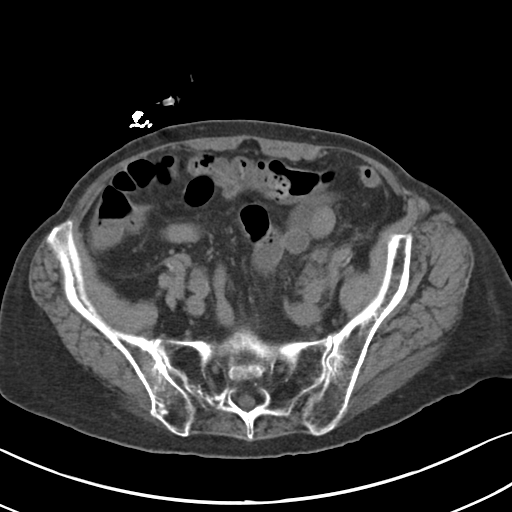
[im 40/95  soft-tissue]
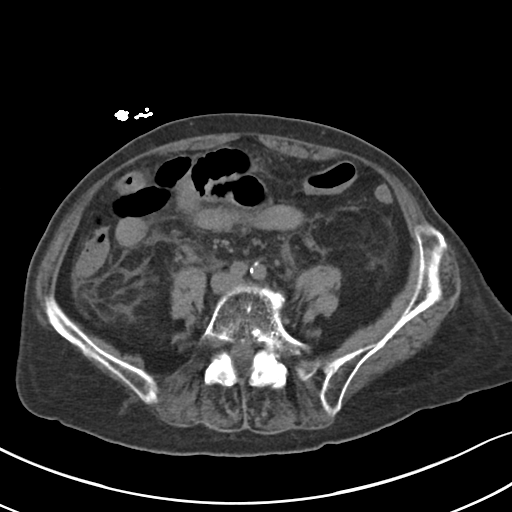
[im 48/95  soft-tissue]
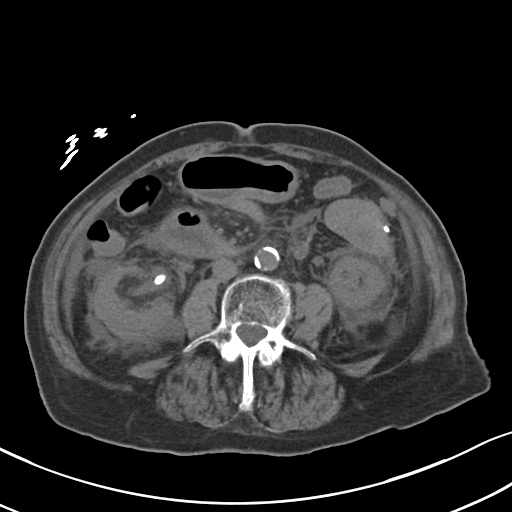
[im 55/95  soft-tissue]
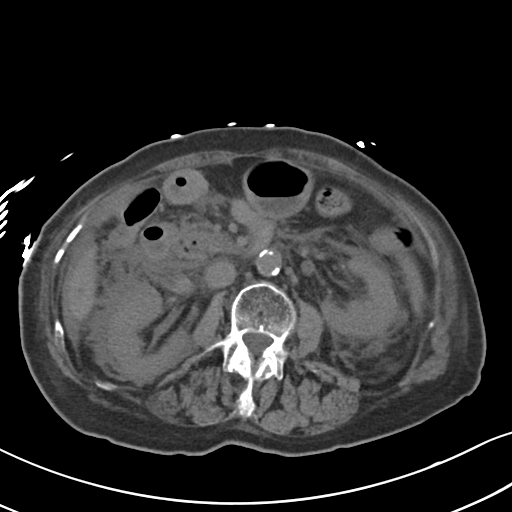
[im 63/95  soft-tissue]
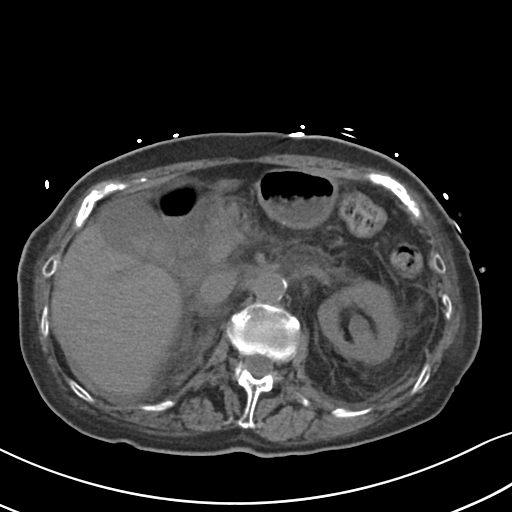
[im 63/95  bone]
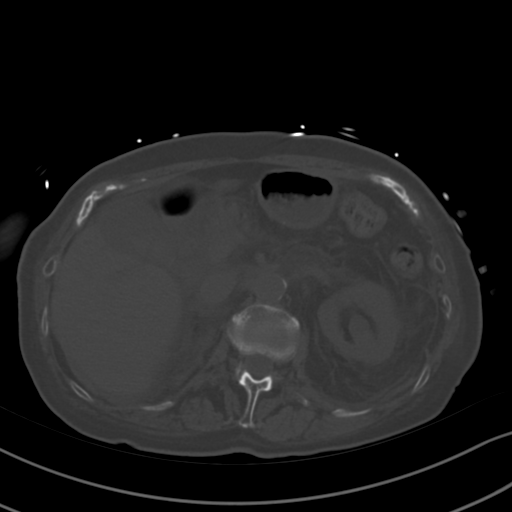
[im 67/95  soft-tissue]
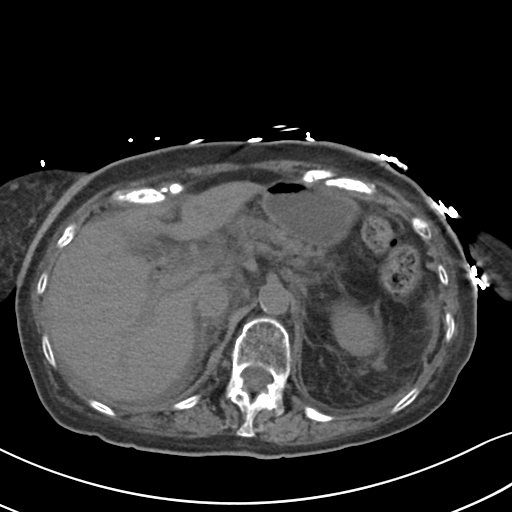
[im 75/95  soft-tissue]
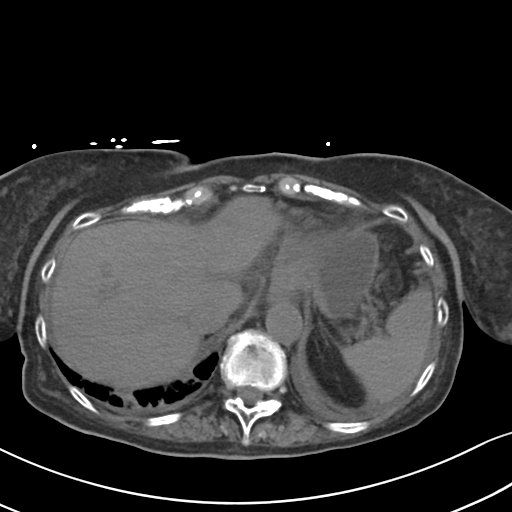
[im 83/95  soft-tissue]
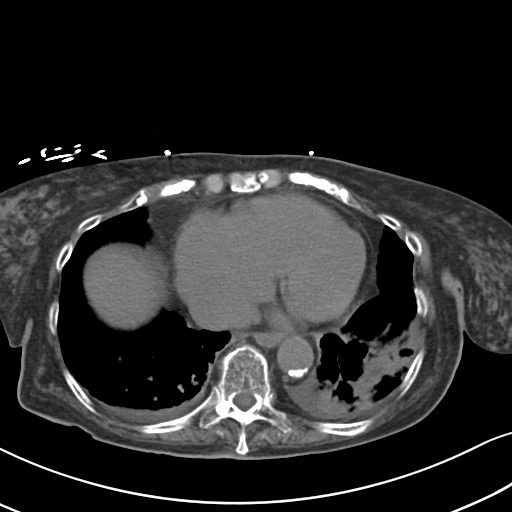
[im 91/95  soft-tissue]
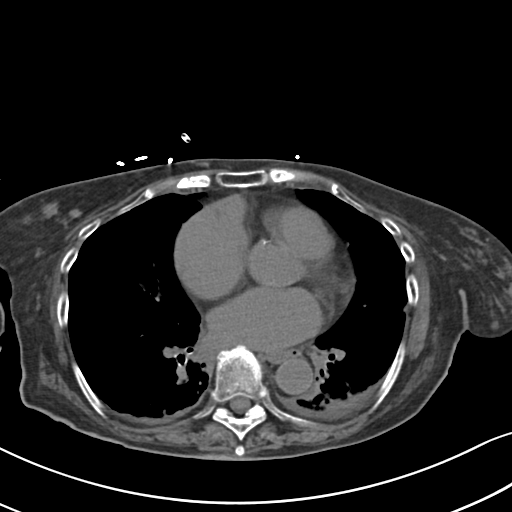

[Series 602: coronals · coronal · 0.92mm/px · 3 of 113 slices shown]
[im 38/113  soft-tissue]
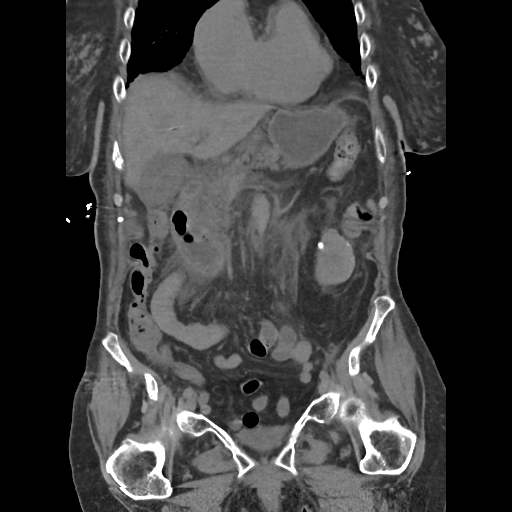
[im 50/113  soft-tissue]
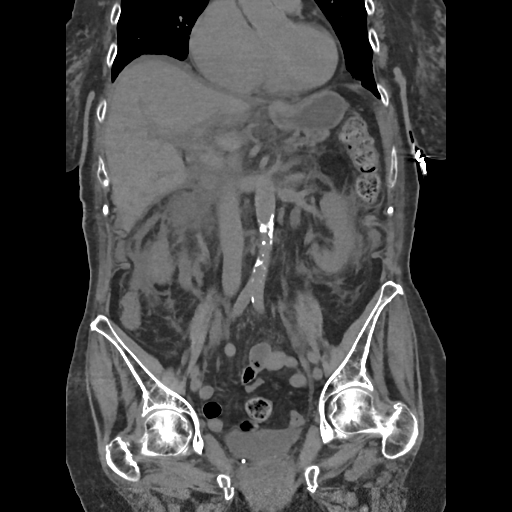
[im 63/113  soft-tissue]
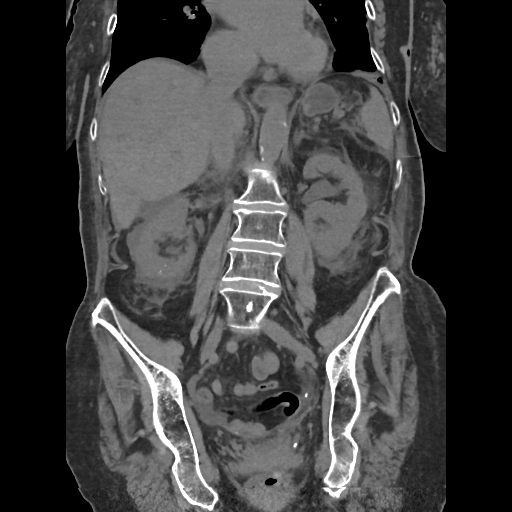

[16 of 46 positions shown; findings below may reference images not displayed]

FINDINGS: Small bilateral pleural effusions with basilar atelectasis, slightly
greater on the left. Mild bronchiectasis. Nodular interstitial
infiltrates in the lung bases. Small esophageal hiatal hernia.
Coronary artery calcifications.

There is duplication of the renal collecting systems and ureters
bilaterally. In the lower pole moiety on the right, there is a
mm ovoid stone with proximal pyelocaliectasis consistent with
obstruction. In the upper pole moiety on the left, there is a 7.8 mm
stone with proximal hydronephrosis consistent with obstruction. The
lower pole moiety on the left in the upper pole moiety on the right
are not obstructed. Additional punctate size stones are demonstrated
in the lower pole of the right kidney. Bladder is decompressed but
no bladder stones are appreciated. There is fairly extensive
perirenal edema and stranding with small amount of ascites and edema
demonstrated throughout the mesenteric and extending into the
pelvis. Small amount of pelvic free fluid. These changes are likely
inflammatory and suggest infected stones or pyelonephritis.

The gallbladder wall appears thickened and there is increased
density in the gallbladder most likely representing sludge.
Suggestion of new mobile area which may indicate gallbladder
infection as well. Circumscribed low-attenuation changes throughout
the kidneys appear similar to prior study and probably represent
cysts. Spleen size is normal. No adrenal gland nodules.
Calcification of the abdominal aorta. Normal caliber aorta and
inferior vena cava. Stomach, small bowel, and colon are not
pathologically enlarged. There is a soft tissue mass with
calcification in the left mid abdomen. This measures about 5.2 x
cm. This is similar in size and appearance to previous study.
Calcified nodules are demonstrated in the subcutaneous fat of the
anterior abdominal wall. No free air in the abdomen.

Pelvis: There are bilateral inguinal hernias containing fat. No
bowel herniation or obstruction. Uterus appears to be surgically
absent. No pelvic mass or lymphadenopathy is suggested. Appendix is
not identified. Degenerative changes in the spine and shoulders.
Mild lumbar scoliosis. No destructive bone lesions.
IMPRESSION: Duplication of the renal collecting systems bilaterally. Obstructing
stone in the lower pole moiety on the right and in the upper pole
more [place] on the left. Diffuse inflammatory infiltration around the
kidneys and throughout the upper abdomen and mesentery with small
amount of ascites. Inflammatory changes also demonstrated in the
gallbladder with evidence of mild pneumobilia and gallbladder
sludge. Left mid abdominal mesenteric mass, stable since previous
study.

## 2015-01-19 ENCOUNTER — Other Ambulatory Visit (HOSPITAL_COMMUNITY): Payer: Self-pay | Admitting: *Deleted

## 2015-01-20 ENCOUNTER — Ambulatory Visit (HOSPITAL_COMMUNITY)
Admission: RE | Admit: 2015-01-20 | Discharge: 2015-01-20 | Disposition: A | Discharge status: Home / Self Care | Payer: Medicare Other | Source: Ambulatory Visit | Attending: Internal Medicine | Admitting: Internal Medicine

## 2015-01-20 DIAGNOSIS — M810 Age-related osteoporosis without current pathological fracture: Secondary | ICD-10-CM | POA: Insufficient documentation

## 2015-01-20 MED ORDER — ZOLEDRONIC ACID 5 MG/100ML IV SOLN
5.0000 mg | Freq: Once | INTRAVENOUS | Status: AC
  Administered 2015-01-20: 5 mg via INTRAVENOUS

## 2015-01-21 MED ORDER — ZOLEDRONIC ACID 5 MG/100ML IV SOLN
INTRAVENOUS | Status: AC
  Filled 2015-01-21: qty 100

## 2015-04-21 ENCOUNTER — Ambulatory Visit (INDEPENDENT_AMBULATORY_CARE_PROVIDER_SITE_OTHER): Payer: Medicare Other | Admitting: Emergency Medicine

## 2015-04-21 VITALS — BP 112/80 | HR 81 | Temp 97.5°F | Resp 18 | Ht 65.0 in | Wt 135.2 lb

## 2015-04-21 DIAGNOSIS — W57XXXA Bitten or stung by nonvenomous insect and other nonvenomous arthropods, initial encounter: Secondary | ICD-10-CM

## 2015-04-21 DIAGNOSIS — L03313 Cellulitis of chest wall: Secondary | ICD-10-CM

## 2015-04-21 MED ORDER — DOXYCYCLINE HYCLATE 100 MG PO CAPS: 100.0000 mg | ORAL_CAPSULE | Freq: Two times a day (BID) | ORAL | Status: DC

## 2015-04-21 NOTE — Patient Instructions (Signed)

## 2015-04-21 NOTE — Progress Notes (Signed)
Subjective:  Patient ID: Penny [name], female    DOB: 1929/01/16  Age: 81 y.o. MRN: [id number]  CC: Insect Bite   HPI Penny [name] presents  patient has a area of cellulitis on her left lateral chest wall. She apparently was bitten by an insect but is uncertain whether his tick bite. She has no fever or chills. Has no cough or other complaints has no pain such as might be expected expected with shingles. She's had no improvement with over-the-counter medication.  History Penny [name] has a past medical history of Long-term (current) use of anticoagulants; Chronic shoulder pain; Heart palpitations; Memory problem; Depression; Hyperlipidemia; Dyslipidemia; Seasonal allergies; Anxiety; Hypertension; CHF (congestive heart failure); Atrial fibrillation; Humerus fracture (03/31/2014); Kidney stones; Anginal pain; Blood in urine; Arthritis; Osteopenia; Syncope and collapse (03/31/2014); Alzheimer's dementia (03/31/2014); Dysrhythmia; and transfusion of packed red blood cells.   She has past surgical history that includes Ureteral stent placement (Bilateral); removal of growth (2007); Knee arthroscopy (Right, 2011); Total shoulder arthroplasty (Right, 10/2007); Nephrolithotomy (Right, 06/04/2013); Appendectomy (~ 1953); Bladder suspension; Cardioversion (2006); Meniscus repair (Left, 07/2000); Cystoscopy with retrograde pyelogram, ureteroscopy and stent placement (Bilateral, 08/26/2014); Holmium laser application (Left, 96/05/5915); and Cystoscopy/retrograde/ureteroscopy/stone extraction with basket (Right, 10/08/2014).   Her  family history includes Heart disease in her mother.  She   reports that she quit smoking about 33 years ago. Her smoking use included Cigarettes. She has a 1.25 pack-year smoking history. She has never used smokeless tobacco. She reports that she does not drink alcohol or use illicit drugs.  Outpatient Prescriptions Prior to Visit  Medication Sig Dispense Refill  . acetaminophen  (TYLENOL) 500 MG tablet Take 500 mg by mouth 2 (two) times daily as needed for mild pain.    . Ascorbic Acid (VITAMIN C PO) Take 1 tablet by mouth daily.     . diazepam (VALIUM) 5 MG tablet Take 2.5-5 mg by mouth every 12 (twelve) hours as needed for anxiety.    . digoxin (LANOXIN) 0.125 MG tablet Take 0.125 mg by mouth 3 (three) times a week. 1 pill on Monday, Wednesday, and Friday    . donepezil (ARICEPT) 10 MG tablet Take 10 mg by mouth at bedtime.     . lactose free nutrition (BOOST) LIQD Take 237 mLs by mouth daily as needed (for nutrition).    . metoprolol tartrate (LOPRESSOR) 25 MG tablet TAKE 1/2 TABLET (12.5 MG TOTAL) BY MOUTH EVERY MORNING 45 tablet 1  . mirtazapine (REMERON) 15 MG tablet Take 15 mg by mouth at bedtime.    . Multiple Vitamin (MULTIVITAMIN WITH MINERALS) TABS Take 1 tablet by mouth daily.    . Omega-3 Fatty Acids (FISH OIL PO) Take 1 capsule by mouth daily.    . Rivaroxaban (XARELTO) 20 MG TABS tablet Take 20 mg by mouth at bedtime.  30 tablet   . rosuvastatin (CRESTOR) 5 MG tablet Take 2.5 mg by mouth at bedtime.    . Calcium Citrate (CALCITRATE PO) Take 1 tablet by mouth daily.     No facility-administered medications prior to visit.    History   Social History  . Marital Status: Married    Spouse Name: N/A  . Number of Children: N/A  . Years of Education: N/A   Social History Main Topics  . Smoking status: Former Smoker -- 0.25 packs/day for 5 years    Types: Cigarettes    Quit date: 07/20/1981  . Smokeless tobacco: Never Used  . Alcohol Use: No  .  Drug Use: No  . Sexual Activity: Yes   Other Topics Concern  . None   Social History Narrative   Social History::   She is married. She has three children and five     grandchildren. 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  HENT: Negative for congestion, ear pain, postnasal  drip, sinus pressure and sore throat.   Eyes: Negative for pain and redness.  Respiratory: Negative for cough, shortness of breath and wheezing.   Cardiovascular: Negative for leg swelling.  Gastrointestinal: Negative for nausea, vomiting, abdominal pain, diarrhea, constipation and blood in stool.  Endocrine: Negative for polyuria.  Genitourinary: Negative for dysuria, urgency, frequency and flank pain.  Musculoskeletal: Negative for gait problem.  Skin: Positive for rash.  Neurological: Negative for weakness and headaches.  Psychiatric/Behavioral: Negative for confusion and decreased concentration. The patient is not nervous/anxious.     Objective:  BP 112/80 mmHg  Pulse 81  Temp(Src) 97.5 F (36.4 C) (Oral)  Resp 18  Ht 5\' 5"  (1.651 m)  Wt 135 lb 3.2 oz (61.326 kg)  BMI 22.50 kg/m2  SpO2 97%  Physical Exam  Constitutional: She is oriented to person, place, and time. She appears well-developed and well-nourished. No distress.  HENT:  Head: Normocephalic and atraumatic.  Right Ear: External ear normal.  Left Ear: External ear normal.  Nose: Nose normal.  Eyes: Conjunctivae and EOM are normal. Pupils are equal, round, and reactive to light. No scleral icterus.  Neck: Normal range of motion. Neck supple. No tracheal deviation present.  Cardiovascular: Normal rate, regular rhythm and normal heart sounds.   Pulmonary/Chest: Effort normal. No respiratory distress. She has no wheezes. She has no rales.  Abdominal: She exhibits no mass. There is no tenderness. There is no rebound and no guarding.  Musculoskeletal: She exhibits no edema.  Lymphadenopathy:    She has no cervical adenopathy.  Neurological: She is alert and oriented to person, place, and time. Coordination normal.  Skin: Skin is warm and dry. Rash noted.  Psychiatric: She has a normal mood and affect. Her behavior is normal.   Penny [name] she is noted to have a cellulitis surrounding a central insect bite.   Assessment &  Plan:   Penny [name] was seen today for insect bite.  Diagnoses and all orders for this visit:  Insect bite  Cellulitis of chest wall  Other orders -     doxycycline (VIBRAMYCIN) 100 MG capsule; Take 1 capsule (100 mg total) by mouth 2 (two) times daily.   I am having Penny [name] start on doxycycline. I am also having her maintain her donepezil, multivitamin with minerals, digoxin, diazepam, rivaroxaban, Omega-3 Fatty Acids (FISH OIL PO), Ascorbic Acid (VITAMIN C PO), Calcium Citrate (CALCITRATE PO), lactose free nutrition, mirtazapine, rosuvastatin, acetaminophen, and metoprolol tartrate.  Meds ordered this encounter  Medications  . doxycycline (VIBRAMYCIN) 100 MG capsule    Sig: Take 1 capsule (100 mg total) by mouth 2 (two) times daily.    Dispense:  20 capsule    Refill:  0    Appropriate red flag conditions were discussed with the patient as well as actions that should be taken.  Patient expressed his understanding.  Follow-up: Return if symptoms worsen or fail to improve.  Roselee Culver, MD

## 2015-06-24 ENCOUNTER — Observation Stay (HOSPITAL_COMMUNITY): Payer: Medicare Other | Admitting: Anesthesiology

## 2015-06-24 ENCOUNTER — Observation Stay (HOSPITAL_COMMUNITY)
Admission: AD | Admit: 2015-06-24 | Discharge: 2015-06-26 | Disposition: A | Discharge status: Home / Self Care | Payer: Medicare Other | Source: Ambulatory Visit | Attending: Urology | Admitting: Urology

## 2015-06-24 ENCOUNTER — Other Ambulatory Visit: Payer: Self-pay | Admitting: Urology

## 2015-06-24 ENCOUNTER — Encounter (HOSPITAL_COMMUNITY): Payer: Self-pay

## 2015-06-24 ENCOUNTER — Encounter (HOSPITAL_COMMUNITY)
Admission: AD | Disposition: A | Discharge status: Home / Self Care | Payer: Self-pay | Source: Ambulatory Visit | Attending: Urology

## 2015-06-24 ENCOUNTER — Observation Stay (HOSPITAL_COMMUNITY): Payer: Medicare Other

## 2015-06-24 DIAGNOSIS — Z87442 Personal history of urinary calculi: Secondary | ICD-10-CM | POA: Insufficient documentation

## 2015-06-24 DIAGNOSIS — G8929 Other chronic pain: Secondary | ICD-10-CM | POA: Insufficient documentation

## 2015-06-24 DIAGNOSIS — Z96611 Presence of right artificial shoulder joint: Secondary | ICD-10-CM | POA: Insufficient documentation

## 2015-06-24 DIAGNOSIS — G309 Alzheimer's disease, unspecified: Secondary | ICD-10-CM | POA: Insufficient documentation

## 2015-06-24 DIAGNOSIS — I10 Essential (primary) hypertension: Secondary | ICD-10-CM | POA: Diagnosis not present

## 2015-06-24 DIAGNOSIS — E785 Hyperlipidemia, unspecified: Secondary | ICD-10-CM | POA: Insufficient documentation

## 2015-06-24 DIAGNOSIS — Z79899 Other long term (current) drug therapy: Secondary | ICD-10-CM | POA: Insufficient documentation

## 2015-06-24 DIAGNOSIS — Z7901 Long term (current) use of anticoagulants: Secondary | ICD-10-CM | POA: Insufficient documentation

## 2015-06-24 DIAGNOSIS — Z87891 Personal history of nicotine dependence: Secondary | ICD-10-CM | POA: Diagnosis not present

## 2015-06-24 DIAGNOSIS — N132 Hydronephrosis with renal and ureteral calculous obstruction: Principal | ICD-10-CM | POA: Insufficient documentation

## 2015-06-24 DIAGNOSIS — N390 Urinary tract infection, site not specified: Secondary | ICD-10-CM | POA: Diagnosis not present

## 2015-06-24 DIAGNOSIS — M1990 Unspecified osteoarthritis, unspecified site: Secondary | ICD-10-CM | POA: Diagnosis not present

## 2015-06-24 DIAGNOSIS — F419 Anxiety disorder, unspecified: Secondary | ICD-10-CM | POA: Insufficient documentation

## 2015-06-24 DIAGNOSIS — M25519 Pain in unspecified shoulder: Secondary | ICD-10-CM | POA: Diagnosis not present

## 2015-06-24 DIAGNOSIS — Q625 Duplication of ureter: Secondary | ICD-10-CM | POA: Insufficient documentation

## 2015-06-24 DIAGNOSIS — F329 Major depressive disorder, single episode, unspecified: Secondary | ICD-10-CM | POA: Insufficient documentation

## 2015-06-24 DIAGNOSIS — I4891 Unspecified atrial fibrillation: Secondary | ICD-10-CM | POA: Diagnosis not present

## 2015-06-24 DIAGNOSIS — D649 Anemia, unspecified: Secondary | ICD-10-CM | POA: Insufficient documentation

## 2015-06-24 DIAGNOSIS — I509 Heart failure, unspecified: Secondary | ICD-10-CM | POA: Insufficient documentation

## 2015-06-24 DIAGNOSIS — R079 Chest pain, unspecified: Secondary | ICD-10-CM

## 2015-06-24 DIAGNOSIS — M549 Dorsalgia, unspecified: Secondary | ICD-10-CM | POA: Diagnosis present

## 2015-06-24 DIAGNOSIS — F0280 Dementia in other diseases classified elsewhere without behavioral disturbance: Secondary | ICD-10-CM | POA: Diagnosis not present

## 2015-06-24 DIAGNOSIS — N201 Calculus of ureter: Secondary | ICD-10-CM | POA: Diagnosis present

## 2015-06-24 HISTORY — PX: CYSTOSCOPY WITH STENT PLACEMENT: SHX5790

## 2015-06-24 LAB — BASIC METABOLIC PANEL
Anion gap: 13 (ref 5–15)
BUN: 16 mg/dL (ref 6–20)
CALCIUM: 9.6 mg/dL (ref 8.9–10.3)
CO2: 23 mmol/L (ref 22–32)
CREATININE: 0.82 mg/dL (ref 0.44–1.00)
Chloride: 102 mmol/L (ref 101–111)
GFR calc Af Amer: >60 mL/min (ref >60)
GFR calc non Af Amer: >60 mL/min (ref >60)
Glucose, Bld: 113 mg/dL — ABNORMAL HIGH (ref 65–99)
Potassium: 3.5 mmol/L (ref 3.5–5.1)
SODIUM: 138 mmol/L (ref 135–145)

## 2015-06-24 SURGERY — CYSTOSCOPY, WITH STENT INSERTION
Anesthesia: General | Site: Ureter | Laterality: Right

## 2015-06-24 MED ORDER — CEFAZOLIN SODIUM-DEXTROSE 2-3 GM-% IV SOLR
INTRAVENOUS | Status: AC
  Filled 2015-06-24: qty 50

## 2015-06-24 MED ORDER — ACETAMINOPHEN 500 MG PO TABS: 500.0000 mg | ORAL_TABLET | Freq: Two times a day (BID) | ORAL | Status: DC | PRN

## 2015-06-24 MED ORDER — ROSUVASTATIN CALCIUM 5 MG PO TABS
2.5000 mg | ORAL_TABLET | Freq: Every day | ORAL | Status: DC
  Administered 2015-06-25: 2.5 mg via ORAL
  Filled 2015-06-24: qty 0.5

## 2015-06-24 MED ORDER — LABETALOL HCL 5 MG/ML IV SOLN
INTRAVENOUS | Status: AC
  Filled 2015-06-24: qty 4

## 2015-06-24 MED ORDER — ONDANSETRON HCL 4 MG/2ML IJ SOLN
4.0000 mg | INTRAMUSCULAR | Status: DC | PRN
  Administered 2015-06-24: 4 mg via INTRAVENOUS
  Filled 2015-06-24: qty 2

## 2015-06-24 MED ORDER — FENTANYL CITRATE (PF) 100 MCG/2ML IJ SOLN
INTRAMUSCULAR | Status: AC
  Filled 2015-06-24: qty 2

## 2015-06-24 MED ORDER — POTASSIUM CHLORIDE ER 10 MEQ PO TBCR
10.0000 meq | EXTENDED_RELEASE_TABLET | Freq: Every day | ORAL | Status: DC
  Administered 2015-06-24 – 2015-06-26 (×3): 10 meq via ORAL
  Filled 2015-06-24 (×6): qty 1

## 2015-06-24 MED ORDER — FENTANYL CITRATE (PF) 100 MCG/2ML IJ SOLN
25.0000 ug | INTRAMUSCULAR | Status: DC | PRN
  Administered 2015-06-24 (×2): 25 ug via INTRAVENOUS

## 2015-06-24 MED ORDER — PHENYLEPHRINE HCL 10 MG/ML IJ SOLN
INTRAMUSCULAR | Status: DC | PRN
  Administered 2015-06-24: 40 ug via INTRAVENOUS

## 2015-06-24 MED ORDER — DEXTROSE 5 % IV SOLN
1.0000 g | INTRAVENOUS | Status: DC
  Administered 2015-06-24 – 2015-06-25 (×2): 1 g via INTRAVENOUS
  Filled 2015-06-24 (×2): qty 10

## 2015-06-24 MED ORDER — METOPROLOL TARTRATE 25 MG PO TABS
25.0000 mg | ORAL_TABLET | Freq: Two times a day (BID) | ORAL | Status: DC
  Administered 2015-06-24 – 2015-06-26 (×3): 25 mg via ORAL
  Filled 2015-06-24 (×3): qty 1

## 2015-06-24 MED ORDER — MIRTAZAPINE 15 MG PO TABS: 15.0000 mg | ORAL_TABLET | Freq: Every evening | ORAL | Status: DC | PRN

## 2015-06-24 MED ORDER — LIDOCAINE HCL (CARDIAC) 20 MG/ML IV SOLN
INTRAVENOUS | Status: AC
  Filled 2015-06-24: qty 5

## 2015-06-24 MED ORDER — RIVAROXABAN 20 MG PO TABS
20.0000 mg | ORAL_TABLET | Freq: Every day | ORAL | Status: DC
  Administered 2015-06-24 – 2015-06-25 (×2): 20 mg via ORAL
  Filled 2015-06-24 (×2): qty 1

## 2015-06-24 MED ORDER — FENTANYL CITRATE (PF) 100 MCG/2ML IJ SOLN
INTRAMUSCULAR | Status: AC
  Filled 2015-06-24: qty 4

## 2015-06-24 MED ORDER — ESMOLOL HCL 10 MG/ML IV SOLN
INTRAVENOUS | Status: DC | PRN
  Administered 2015-06-24: 5 mg via INTRAVENOUS
  Administered 2015-06-24: 10 mg via INTRAVENOUS

## 2015-06-24 MED ORDER — DIGOXIN 125 MCG PO TABS
0.1250 mg | ORAL_TABLET | ORAL | Status: DC
  Administered 2015-06-24: 0.125 mg via ORAL
  Filled 2015-06-24: qty 1

## 2015-06-24 MED ORDER — OXYCODONE HCL 5 MG PO TABS: 5.0000 mg | ORAL_TABLET | ORAL | Status: DC | PRN

## 2015-06-24 MED ORDER — SODIUM CHLORIDE 0.45 % IV SOLN
INTRAVENOUS | Status: DC
  Administered 2015-06-24 (×2): via INTRAVENOUS

## 2015-06-24 MED ORDER — ADULT MULTIVITAMIN W/MINERALS CH
1.0000 | ORAL_TABLET | Freq: Every day | ORAL | Status: DC
  Administered 2015-06-25 – 2015-06-26 (×2): 1 via ORAL
  Filled 2015-06-24 (×2): qty 1

## 2015-06-24 MED ORDER — CEFAZOLIN SODIUM-DEXTROSE 2-3 GM-% IV SOLR
2.0000 g | Freq: Once | INTRAVENOUS | Status: AC
  Administered 2015-06-24: 2 g via INTRAVENOUS

## 2015-06-24 MED ORDER — PROMETHAZINE HCL 25 MG/ML IJ SOLN: 6.2500 mg | INTRAMUSCULAR | Status: DC | PRN

## 2015-06-24 MED ORDER — MEPERIDINE HCL 50 MG/ML IJ SOLN: 6.2500 mg | INTRAMUSCULAR | Status: DC | PRN

## 2015-06-24 MED ORDER — DIAZEPAM 5 MG PO TABS: 2.5000 mg | ORAL_TABLET | Freq: Two times a day (BID) | ORAL | Status: DC | PRN

## 2015-06-24 MED ORDER — IOHEXOL 300 MG/ML  SOLN
INTRAMUSCULAR | Status: DC | PRN
  Administered 2015-06-24: 20 mL via INTRAVENOUS

## 2015-06-24 MED ORDER — PROPOFOL 10 MG/ML IV BOLUS
INTRAVENOUS | Status: DC | PRN
  Administered 2015-06-24: 120 mg via INTRAVENOUS

## 2015-06-24 MED ORDER — DONEPEZIL HCL 5 MG PO TABS
10.0000 mg | ORAL_TABLET | Freq: Every day | ORAL | Status: DC
  Administered 2015-06-24 – 2015-06-25 (×2): 10 mg via ORAL
  Filled 2015-06-24 (×2): qty 2

## 2015-06-24 MED ORDER — ESMOLOL HCL 10 MG/ML IV SOLN
INTRAVENOUS | Status: AC
  Filled 2015-06-24: qty 10

## 2015-06-24 MED ORDER — ONDANSETRON HCL 4 MG/2ML IJ SOLN
INTRAMUSCULAR | Status: AC
  Filled 2015-06-24: qty 2

## 2015-06-24 MED ORDER — PROPOFOL 10 MG/ML IV BOLUS
INTRAVENOUS | Status: AC
  Filled 2015-06-24: qty 20

## 2015-06-24 MED ORDER — LIDOCAINE HCL (CARDIAC) 20 MG/ML IV SOLN
INTRAVENOUS | Status: DC | PRN
  Administered 2015-06-24: 50 mg via INTRAVENOUS

## 2015-06-24 MED ORDER — FENTANYL CITRATE (PF) 100 MCG/2ML IJ SOLN
INTRAMUSCULAR | Status: DC | PRN
  Administered 2015-06-24: 25 ug via INTRAVENOUS
  Administered 2015-06-24: 50 ug via INTRAVENOUS
  Administered 2015-06-24: 25 ug via INTRAVENOUS

## 2015-06-24 MED ORDER — LABETALOL HCL 5 MG/ML IV SOLN
5.0000 mg | Freq: Once | INTRAVENOUS | Status: AC
  Administered 2015-06-24: 5 mg via INTRAVENOUS

## 2015-06-24 MED ORDER — ONDANSETRON HCL 4 MG/2ML IJ SOLN
INTRAMUSCULAR | Status: DC | PRN
  Administered 2015-06-24: 4 mg via INTRAVENOUS

## 2015-06-24 SURGICAL SUPPLY — 17 items
BAG URO CATCHER STRL LF (DRAPE) ×3 IMPLANT
CATH INTERMIT  6FR 70CM (CATHETERS) ×3 IMPLANT
CATH URET 5FR 28IN CONE TIP (BALLOONS)
CATH URET 5FR 70CM CONE TIP (BALLOONS) IMPLANT
CLOTH BEACON ORANGE TIMEOUT ST (SAFETY) ×3 IMPLANT
GLOVE BIOGEL M 8.0 STRL (GLOVE) ×3 IMPLANT
GOWN STRL REUS W/ TWL XL LVL3 (GOWN DISPOSABLE) ×1 IMPLANT
GOWN STRL REUS W/TWL XL LVL3 (GOWN DISPOSABLE) ×5 IMPLANT
GUIDEWIRE ANG ZIPWIRE 038X150 (WIRE) ×6 IMPLANT
GUIDEWIRE STR DUAL SENSOR (WIRE) ×6 IMPLANT
MANIFOLD NEPTUNE II (INSTRUMENTS) ×3 IMPLANT
PACK CYSTO (CUSTOM PROCEDURE TRAY) ×3 IMPLANT
SHEATH ACCESS URETERAL 38CM (SHEATH) IMPLANT
STENT URET 6FRX24 CONTOUR (STENTS) ×6 IMPLANT
TUBING CONNECTING 10 (TUBING) ×2 IMPLANT
TUBING CONNECTING 10' (TUBING) ×1
WIRE COONS/BENSON .038X145CM (WIRE) IMPLANT

## 2015-06-24 NOTE — H&P (Signed)
H&P  Chief Complaint: back pain, fever  History of Present Illness: Penny [name] is a 81 y.o. year old femalewho is admitted at this time for stent placement for obstructing/infected right ureteral calculi.  She has a prior history of urolithiasis and also has had a left UPJ stone treated in 2015 that was associated with an infection.  She began having symptoms within the past day or 2-right flank pain, low-grade fever.  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  She is admitted for IV antibiotic management as well as stent placement today.  Past Medical History  Diagnosis Date  . Long-term (current) use of anticoagulants   . Chronic shoulder pain   . Heart palpitations     rare  . Memory problem     "a little short term and long term memory loss" (03/31/2014)  . Depression   . Hyperlipidemia   . Dyslipidemia   . Seasonal allergies   . Anxiety   . Hypertension   . CHF (congestive heart failure)   . Atrial fibrillation   . Humerus fracture 03/31/2014    "fell and broke left arm"  . Kidney stones   . Anginal pain   . Blood in urine     "chronic" (03/31/2014)  . Arthritis     "joints" (03/31/2014)  . Osteopenia     "severe" (03/31/2014)  . Syncope and collapse 03/31/2014  . Alzheimer's dementia 03/31/2014  . Dysrhythmia     atrail fib   . Hx of transfusion of packed red blood cells     Past Surgical History  Procedure Laterality Date  . Ureteral stent placement Bilateral     for stenosis; "they've been removed"  . Removal of growth  2007    growth under kidney removed and possibly kindey stones removed  . Knee arthroscopy Right 2011  . Total shoulder arthroplasty Right 10/2007  . Nephrolithotomy Right 06/04/2013    Procedure: NEPHROLITHOTOMY PERCUTANEOUS;  Surgeon: Franchot Gallo, MD;  Location: [facility];  Service: Urology;  Laterality: Right;  . Appendectomy  ~ 1953  . Bladder  suspension      w/hysterectomy  . Cardioversion  2006  . Meniscus repair Left 07/2000  . Cystoscopy with retrograde pyelogram, ureteroscopy and stent placement Bilateral 08/26/2014    Procedure: CYSTOSCOPY WITH RETROGRADE PYELOGRAM, URETEROSCOPY , EXTRACTION OF STONES AND STENT PLACEMENT;  Surgeon: Jorja Loa, MD;  Location: [facility];  Service: Urology;  Laterality: Bilateral;  . Holmium laser application Left 71/0/6269    Procedure: HOLMIUM LASER APPLICATION;  Surgeon: Jorja Loa, MD;  Location: [facility];  Service: Urology;  Laterality: Left;  . Cystoscopy/retrograde/ureteroscopy/stone extraction with basket Right 10/08/2014    Procedure: CYSTOSCOPY, retrograde pyelogram,URETEROSCOPY, laser treatment of calculus, stent exchange;  Surgeon: Jorja Loa, MD;  Location: [facility];  Service: Urology;  Laterality: Right;    Home Medications:  Medications Prior to Admission  Medication Sig Dispense Refill  . acetaminophen (TYLENOL) 500 MG tablet Take 500 mg by mouth 2 (two) times daily as needed for mild pain.    . Ascorbic Acid (VITAMIN C PO) Take 1 tablet by mouth daily.     . ciprofloxacin (CIPRO) 500 MG tablet Take 1 tablet by mouth 2 (two) times daily.  0  . diazepam (VALIUM) 5 MG tablet Take 2.5-5 mg by mouth every 12 (twelve) hours as needed for anxiety.    . digoxin (  LANOXIN) 0.125 MG tablet Take 0.125 mg by mouth 3 (three) times a week. 1 pill on Monday, Wednesday, and Friday    . donepezil (ARICEPT) 10 MG tablet Take 10 mg by mouth at bedtime.     . metoprolol tartrate (LOPRESSOR) 25 MG tablet TAKE 1/2 TABLET (12.5 MG TOTAL) BY MOUTH EVERY MORNING 45 tablet 1  . mirtazapine (REMERON) 15 MG tablet Take 15 mg by mouth at bedtime as needed (sleep).     . Multiple Vitamin (MULTIVITAMIN WITH MINERALS) TABS Take 1 tablet by mouth daily.    . Omega-3 Fatty Acids (FISH OIL PO) Take 1 capsule by mouth daily.    . potassium chloride (K-DUR) 10 MEQ tablet Take 10 mEq by mouth  daily.  0  . Rivaroxaban (XARELTO) 20 MG TABS tablet Take 20 mg by mouth at bedtime.  30 tablet   . rosuvastatin (CRESTOR) 5 MG tablet Take 2.5 mg by mouth at bedtime.    [name] doxycycline (VIBRAMYCIN) 100 MG capsule Take 1 capsule (100 mg total) by mouth 2 (two) times daily. (Patient not taking: Reported on 06/24/2015) 20 capsule 0    Allergies:  Allergies  Allergen Reactions  . Sulfa Antibiotics Rash  . Zocor [Simvastatin] Other (See Comments)    Family History  Problem Relation Age of Onset  . Heart disease Mother     Social History:  reports that she quit smoking about 33 years ago. Her smoking use included Cigarettes. She has a 1.25 pack-year smoking history. She has never used smokeless tobacco. She reports that she does not drink alcohol or use illicit drugs.  ROS: Genitourinary: no dysuria and no hematuria.  Gastrointestinal: flank pain.  Constitutional: fever and feeling tired (fatigue).  Musculoskeletal: back pain.  Neurological: confusion and memory loss.  Physical Exam:  Vital signs in last 24 hours: Temp:  [97.6 F (36.4 C)] 97.6 F (36.4 C) (08/05 1603) Pulse Rate:  [99-102] 102 (08/05 1603) Resp:  [16-18] 16 (08/05 1603) BP: (122-130)/(71-83) 130/83 mmHg (08/05 1603) SpO2:  [96 %-98 %] 98 % (08/05 1603) Weight:  [58.514 kg (129 lb)] 58.514 kg (129 lb) (08/05 1247) Constitutional: Well nourished and well developed . No acute distress.  ENT:. The ears and nose are normal in appearance.  Neck: The appearance of the neck is normal and no neck mass is present.  Pulmonary: No respiratory distress and normal respiratory rhythm and effort.  Cardiovascular: Heart rate and rhythm are normal . No peripheral edema.  Abdomen: The abdomen is rounded. No masses are palpated. Mild tenderness in the RUQ is present. Right CVA tenderness and no left CVA tenderness. No hernias are palpable. No hepatosplenomegaly noted.  Lymphatics: The femoral and inguinal nodes are not enlarged or  tender.  Skin: Normal skin turgor, no visible rash and no visible skin lesions.   Laboratory Data:  Results for orders placed or performed during the hospital encounter of 06/24/15 (from the past 24 hour(s))  Basic metabolic panel     Status: Abnormal   Collection Time: 06/24/15  1:50 PM  Result Value Ref Range   Sodium 138 135 - 145 mmol/L   Potassium 3.5 3.5 - 5.1 mmol/L   Chloride 102 101 - 111 mmol/L   CO2 23 22 - 32 mmol/L   Glucose, Bld 113 (H) 65 - 99 mg/dL   BUN 16 6 - 20 mg/dL   Creatinine, Ser 0.82 0.44 - 1.00 mg/dL   Calcium 9.6 8.9 - 10.3 mg/dL   GFR calc non  Af Amer >60 >60 mL/min   GFR calc Af Amer >60 >60 mL/min   Anion gap 13 5 - 15   No results found for this or any previous visit (from the past 240 hour(s)). Creatinine:  Recent Labs  06/24/15 1350  CREATININE 0.82    Radiologic Imaging: X-ray Chest Pa And Lateral  06/24/2015   CLINICAL DATA:  Chest pain, confusion.  Right shoulder pain.  EXAM: CHEST  2 VIEW  COMPARISON:  10/08/2014  FINDINGS: Heart is borderline in size. Lungs are clear. No effusions or edema. No acute bony abnormality. Prior right shoulder replacement.  IMPRESSION: No active cardiopulmonary disease.   Electronically Signed   By: Rolm Baptise M.D.   On: 06/24/2015 14:29    Impression/Assessment:  1. Urinary tract infection with hydronephrosis    2. 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   Plan:  As the patient has a UTI and an obstructing stone, I feel strongly about ringing her in the hospital for observation at the least as well as placement of a right double-J stent    We will get that scheduled for later on today. We will keep her on IV antibiotics. We will culture her urine. If she is afebrile tomorrow, he'll go home    She will need eventual lithotripsy or, more likely, ureteroscopy of the stones.    Jorja Loa 06/24/2015, 4:43 PM  Lillette Boxer. Rhythm Wigfall MD

## 2015-06-24 NOTE — Op Note (Signed)
Preoperative diagnosis: Obstructing upper ureteral calculi and partially duplicated right ureters, urinary tract infection  Postoperative diagnosis: Same  Procedure: Cystoscopy, retrograde ureteropyelogram, placement of double-J stents in both partially duplicated ureters, right ureteroscopy, interpretive fluoroscopy  Surgeon: Jeffren Dombek  Anesthesia: Gen. with LMA  Specimen: Urine for culture  Stents: Separate 6 French by 24 cm contour double-J stents in both upper pole moieties on the right without tethers  Complications: None  Brief history: 81 year old female with history of urolithiasis and prior infections with stones. Her most recent follow-up in my office revealed no evidence of urolithiasis. She presented to the office today, having been seen by her primary care physician yesterday with low-grade fever and right flank pain. CT stone protocol was performed revealing moderate to large stones in both upper pole ureteral moieties. There was significant hydronephrosis in the lower pole renal moiety. With an infection, which has been treated so far with Rocephin, it was recommended that she undergo urgent decompression of her upper pole moieties. She was admitted earlier today and presents at this time for attempted stent placement.  Procedure: The patient was properly marked and identified in the holding area. She received 2 g of IV Ancef. She was taken to the operating room where general and aesthetic was administered with the LMA. She was placed in the dorsolithotomy position. Genitalia and perineum were prepped and draped. Proper timeout was performed.  A 23 French cystoscope was advanced into her bladder. Urine was sent for culture. Bladder was inspected circumferentially. There were no tumors or trabeculations. No foreign bodies were noted. There were single ureteral orifices bilaterally. The right ureteral orifice was cannulated with a 6 [place] open-ended catheter. Gentle retrograde  ureteropyelogram was performed. This revealed a single ureter to the mid ureter, above this there was bifurcation. The upper pole ureteral moiety had a filling defect but there was easy flow of the contrast, which was introduced under very low pressure, into the calyceal system which was non-obstructive. There was no flow into the lower pole pelvis or calyceal system.  I then advanced a 0.035 inch sensor-tip guidewire through the open-ended catheter and into the upper pole ureter. This was easily curled in the pelvis. The scope was then removed, leaving the guidewire in place for eventual stent placement. I then passed the cystoscope, and advanced the open-ended catheter up to the area of the ureteral duplication/bifurcation. The sensor-tip guidewire easily advanced through this and up into the upper part of the lower pole ureter. I was unable to advance the guidewire past the obstructing stone. I then tried a Glidewire which again could not get past the stone. It did eventually negotiate around the stone. I advanced the open-ended catheter over top of the guidewire. Retrograde, done gently revealed that this was not within the renal pelvis, most likely extra urine ureteral. The guidewire and open-ended catheter were then backed out. Multiple attempts were then made to pass the guidewire past the stone again. This was unsuccessful. At this point, I felt that ureteroscopy was necessary. The guidewire was removed from the lower pole ureter. I then advanced a 6 French rigid ureteroscope up the ureter, into the duplicated ureter, and then after some difficulty was able to advance the sensor-tip guidewire past the obstructing stone which was identified. Once a good curl was seen in the renal pelvis, I backed the ureteroscope out. I then advanced the open-ended catheter over top of the guidewire. Hydronephrotic drip was noted after I removed the sensor-tip guidewire. The guidewire was  then replaced and the open-ended  catheter removed. I then placed, cystoscopically, a 24 cm x 6 French contour stent over the guidewire that was placed in the lower pole ureteral unit. The same was then done for the upper pole ureter. Good curls were seen proximally and distally using fluoroscopic and cystoscopic guidance. At this point, the scope was removed. The bladder was drained and the procedure terminated.  The patient tolerated procedure well and was returned to the PACU in stable condition.

## 2015-06-24 NOTE — Transfer of Care (Signed)
Immediate Anesthesia Transfer of Care Note  Patient: Penny [name]  Procedure(s) Performed: Procedure(s) with comments: CYSTOSCOPY URETEROSCOPY, PYLEOGRAM, WITH STENT PLACEMENT X2  (Right) - 2 RIGHT URETERS  Patient Location: PACU  Anesthesia Type:General  Level of Consciousness: awake, alert  and oriented  Airway & Oxygen Therapy: Patient Spontanous Breathing and Patient connected to face mask oxygen  Post-op Assessment: Report given to RN and Post -op Vital signs reviewed and stable  Post vital signs: Reviewed and stable  Last Vitals:  Filed Vitals:   06/24/15 1603  BP: 130/83  Pulse: 102  Temp: 36.4 C  Resp: 16    Complications: No apparent anesthesia complications

## 2015-06-24 NOTE — Anesthesia Preprocedure Evaluation (Signed)
Anesthesia Evaluation  Patient identified by MRN, date of birth, ID band Patient awake    Reviewed: Allergy & Precautions, H&P , NPO status , Patient's Chart, lab work & pertinent test results, reviewed documented beta blocker date and time   Airway Mallampati: II  TM Distance: >3 FB Neck ROM: Full    Dental no notable dental hx.    Pulmonary former smoker,  breath sounds clear to auscultation  Pulmonary exam normal       Cardiovascular hypertension, Pt. on home beta blockers + angina +CHF Normal cardiovascular exam+ dysrhythmias Atrial Fibrillation Rhythm:Regular Rate:Normal     Neuro/Psych PSYCHIATRIC DISORDERS Anxiety Depression Dementia     GI/Hepatic negative GI ROS, Neg liver ROS,   Endo/Other  negative endocrine ROS  Renal/GU Renal disease     Musculoskeletal  (+) Arthritis -,   Abdominal   Peds  Hematology  (+) anemia ,   Anesthesia Other Findings   Reproductive/Obstetrics negative OB ROS                             Anesthesia Physical  Anesthesia Plan  ASA: III  Anesthesia Plan: General   Post-op Pain Management:    Induction: Intravenous  Airway Management Planned: LMA  Additional Equipment:   Intra-op Plan:   Post-operative Plan: Extubation in OR  Informed Consent: I have reviewed the patients History and Physical, chart, labs and discussed the procedure including the risks, benefits and alternatives for the proposed anesthesia with the patient or authorized representative who has indicated his/her understanding and acceptance.   Dental advisory given  Plan Discussed with: CRNA  Anesthesia Plan Comments:         Anesthesia Quick Evaluation

## 2015-06-24 NOTE — Anesthesia Procedure Notes (Signed)
Procedure Name: LMA Insertion Date/Time: 06/24/2015 5:20 PM Performed by: Noralyn Pick D Pre-anesthesia Checklist: Patient identified, Emergency Drugs available, Suction available, Patient being monitored and Timeout performed Patient Re-evaluated:Patient Re-evaluated prior to inductionOxygen Delivery Method: Circle system utilized Preoxygenation: Pre-oxygenation with 100% oxygen Intubation Type: IV induction Ventilation: Mask ventilation without difficulty LMA Size: 4.0 Number of attempts: 1 Placement Confirmation: positive ETCO2 and breath sounds checked- equal and bilateral

## 2015-06-25 DIAGNOSIS — N132 Hydronephrosis with renal and ureteral calculous obstruction: Secondary | ICD-10-CM | POA: Diagnosis not present

## 2015-06-25 NOTE — Discharge Summary (Signed)
Date of admission: 06/24/2015  Date of discharge: 06/25/2015  Admission diagnosis: RIGHT nephrolithiasis, pyelonephritis  Discharge diagnosis: RIGHT nephrolithiasis, pyelonephritis  Secondary diagnoses: HLD, depression, CHF  History and Physical: For full details, please see admission history and physical. Briefly, Penny [name] is a 81 y.o. 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   Hospital Course:   RIGHT nephrolithiasis with pyelonephritis-status post cystourethroscopy with decompression and placement of RIGHT sided ureteral stents in duplicated system. Transferred to the floor post procedurally. It well without fevers. Tolerating regular diet. Voiding without difficulty. No stent symptoms. Urine culture no growth x24h at time of discharge. She was discharged home on Vantin which she will continue for a 14 day course. She'll follow-up with Dr. Diona Fanti for definitive stone management as scheduled.  Chronic Anticoagulation on Xarelto-pharmacists inform me that given her creatinine clearance she should be on the 15 mg dose rather than the 20 mg dose of Xarelto. We discussed that this would be best managed by her prescribing provider. We'll have her follow-up with her prescribing provider as an outpatient to adjust as necessary.  Laboratory values: No results for input(s): HGB, HCT in the last 72 hours.   Physical Exam: NAD Abdomen soft, NT, ND No foley Extremities WWP   Recent Labs  06/24/15 1350  CREATININE 0.82    Disposition: Home  Discharge instruction: The patient was instructed to be ambulatory but told to refrain from heavy lifting, strenuous activity, or driving.   You have been discharged home with a ureteral stent in place. This is a temporary tube placed in the tube (aka ureter) that drains from the kidney to the bladder. This stent is TEMPORARY and it must be  removed or exchanged within three months or else significant damage can occur.  Stents cause most patients to have side effects which may include urinary urgency, frequency, and/or pain with voiding. In a female, they can cause erection problems. They may also cause discomfort with strenuous activity. You will likely receive several medicines to help with these symptoms, for example, oxybutynin (ditropan) or tamsulosin (flomax). Oxybutynin helps quiet the bladder, but it may cause constipation or dry mouth. Please use over-the-counter laxatives or stool softeners to help with the former, and sucking on a sugar-free lozenge can help with dry mouth. Tamsulosin is usually taken at night before going to bed, and it helps with stent discomfort. You may also be prescribed a urinary analgesic (e.g. pyridium, prosed, uribel) to help with pain with urination that can change the color of your urine. Please do not be alarmed by this color change.  You may see some blood in your urine, but this is usually normal. If you have persistent bleeding that is not improving and/or large amounts of clots in your urine and the inability to urinate.   Discharge medications:    Medication List    ASK your doctor about these medications        acetaminophen 500 MG tablet  Commonly known as:  TYLENOL  Take 500 mg by mouth 2 (two) times daily as needed for mild pain.     ciprofloxacin 500 MG tablet  Commonly known as:  CIPRO  Take 1 tablet by mouth 2 (two) times daily.     diazepam 5 MG tablet  Commonly known as:  VALIUM  Take 2.5-5 mg by mouth every 12 (twelve) hours as needed for anxiety.  digoxin 0.125 MG tablet  Commonly known as:  LANOXIN  Take 0.125 mg by mouth 3 (three) times a week. 1 pill on Monday, Wednesday, and Friday     donepezil 10 MG tablet  Commonly known as:  ARICEPT  Take 10 mg by mouth at bedtime.     doxycycline 100 MG capsule  Commonly known as:  VIBRAMYCIN  Take 1 capsule (100 mg  total) by mouth 2 (two) times daily.     FISH OIL PO  Take 1 capsule by mouth daily.     metoprolol tartrate 25 MG tablet  Commonly known as:  LOPRESSOR  TAKE 1/2 TABLET (12.5 MG TOTAL) BY MOUTH EVERY MORNING     mirtazapine 15 MG tablet  Commonly known as:  REMERON  Take 15 mg by mouth at bedtime as needed (sleep).     multivitamin with minerals Tabs tablet  Take 1 tablet by mouth daily.     potassium chloride 10 MEQ tablet  Commonly known as:  K-DUR  Take 10 mEq by mouth daily.     rosuvastatin 5 MG tablet  Commonly known as:  CRESTOR  Take 2.5 mg by mouth at bedtime.     VITAMIN C PO  Take 1 tablet by mouth daily.     XARELTO 20 MG Tabs tablet  Generic drug:  rivaroxaban  Take 20 mg by mouth at bedtime.        Followup:  Follow-up Information    Follow up with Jorja Loa, MD.   Specialty:  Urology   Why:  as scheduled   Contact information:   [place] [address] [phone]

## 2015-06-25 NOTE — Anesthesia Postprocedure Evaluation (Signed)
  Anesthesia Post-op Note  Patient: Penny [name]  Procedure(s) Performed: Procedure(s) (LRB): CYSTOSCOPY URETEROSCOPY, PYLEOGRAM, WITH STENT PLACEMENT X2  (Right)  Patient Location: PACU  Anesthesia Type: General  Level of Consciousness: awake and alert   Airway and Oxygen Therapy: Patient Spontanous Breathing  Post-op Pain: mild  Post-op Assessment: Post-op Vital signs reviewed, Patient's Cardiovascular Status Stable, Respiratory Function Stable, Patent Airway and No signs of Nausea or vomiting  Last Vitals:  Filed Vitals:   06/24/15 2341  BP: 108/64  Pulse: 97  Temp: 36.4 C  Resp: 20    Post-op Vital Signs: stable   Complications: No apparent anesthesia complications

## 2015-06-25 NOTE — Progress Notes (Signed)
1 Day Post-Op Subjective: NAEON. No pain. No stent symptoms. Voiding volitionally.  Objective: Vital signs in last 24 hours: Temp:  [97.3 F (36.3 C)-99.1 F (37.3 C)] 97.5 F (36.4 C) (08/05 2341) Pulse Rate:  [97-115] 97 (08/05 2341) Resp:  [16-21] 20 (08/05 2341) BP: (101-159)/(56-95) 108/64 mmHg (08/05 2341) SpO2:  [94 %-100 %] 98 % (08/05 2341) Weight:  [58.1 kg (128 lb 1.4 oz)-58.514 kg (129 lb)] 58.1 kg (128 lb 1.4 oz) (08/05 2003)  Intake/Output from previous day: 08/05 0701 - 08/06 0700 In: 800 [I.V.:800] Out: -  Intake/Output this shift:    Physical Exam:  General: Alert and oriented CV: RRR Lungs: Clear Abdomen: Soft, ND Incisions: Ext: NT, No erythema  Lab Results: No results for input(s): HGB, HCT in the last 72 hours. BMET  Recent Labs  06/24/15 1350  NA 138  K 3.5  CL 102  CO2 23  GLUCOSE 113*  BUN 16  CREATININE 0.82  CALCIUM 9.6     Studies/Results: X-ray Chest Pa And Lateral  06/24/2015   CLINICAL DATA:  Chest pain, confusion.  Right shoulder pain.  EXAM: CHEST  2 VIEW  COMPARISON:  10/08/2014  FINDINGS: Heart is borderline in size. Lungs are clear. No effusions or edema. No acute bony abnormality. Prior right shoulder replacement.  IMPRESSION: No active cardiopulmonary disease.   Electronically Signed   By: Rolm Baptise M.D.   On: 06/24/2015 14:29    Assessment/Plan:  81yF with history of urolithiasis with infection stones as well as duplicated RIGHT collecting system found to have obstructing RIGHT upper pole/lower pole moiety stones status post stent placement in both systems 06/24/15.  -MLIV -regular diet -Await UCx results -Continue Rocephin until culture sensitivities return -OOB/ambulate with assistance  -Potential d/c later this afternoon versus tomorrow morning pending clinical course and culture sensitivities  Star Age 06/25/2015, 3:26 AM

## 2015-06-25 NOTE — Discharge Instructions (Signed)
You have been discharged home with a ureteral stent in place. This is a temporary tube placed in the tube (aka ureter) that drains from the kidney to the bladder. This stent is TEMPORARY and it must be removed or exchanged within three months or else significant damage can occur.  Stents cause most patients to have side effects which may include urinary urgency, frequency, and/or pain with voiding. In a female, they can cause erection problems. They may also cause discomfort with strenuous activity. You will likely receive several medicines to help with these symptoms, for example, oxybutynin (ditropan) or tamsulosin (flomax). Oxybutynin helps quiet the bladder, but it may cause constipation or dry mouth. Please use over-the-counter laxatives or stool softeners to help with the former, and sucking on a sugar-free lozenge can help with dry mouth. Tamsulosin is usually taken at night before going to bed, and it helps with stent discomfort. You may also be prescribed a urinary analgesic (e.g. pyridium, prosed, uribel) to help with pain with urination that can change the color of your urine. Please do not be alarmed by this color change.  You may see some blood in your urine, but this is usually normal. If you have persistent bleeding that is not improving and/or large amounts of clots in your urine and the inability to urinate.

## 2015-06-26 DIAGNOSIS — N132 Hydronephrosis with renal and ureteral calculous obstruction: Secondary | ICD-10-CM | POA: Diagnosis not present

## 2015-06-26 LAB — CBC
HCT: 33.5 % — ABNORMAL LOW (ref 36.0–46.0)
HEMOGLOBIN: 10.8 g/dL — AB (ref 12.0–15.0)
MCH: 27.6 pg (ref 26.0–34.0)
MCHC: 32.2 g/dL (ref 30.0–36.0)
MCV: 85.5 fL (ref 78.0–100.0)
PLATELETS: 240 10*3/uL (ref 150–400)
RBC: 3.92 MIL/uL (ref 3.87–5.11)
RDW: 14.5 % (ref 11.5–15.5)
WBC: 15.2 10*3/uL — ABNORMAL HIGH (ref 4.0–10.5)

## 2015-06-26 LAB — BASIC METABOLIC PANEL
Anion gap: 8 (ref 5–15)
BUN: 20 mg/dL (ref 6–20)
CO2: 27 mmol/L (ref 22–32)
CREATININE: 0.78 mg/dL (ref 0.44–1.00)
Calcium: 8.8 mg/dL — ABNORMAL LOW (ref 8.9–10.3)
Chloride: 102 mmol/L (ref 101–111)
GFR calc Af Amer: >60 mL/min (ref >60)
GFR calc non Af Amer: >60 mL/min (ref >60)
Glucose, Bld: 99 mg/dL (ref 65–99)
POTASSIUM: 3.3 mmol/L — AB (ref 3.5–5.1)
Sodium: 137 mmol/L (ref 135–145)

## 2015-06-26 LAB — URINE CULTURE: CULTURE: NO GROWTH

## 2015-06-26 MED ORDER — TRAMADOL HCL 50 MG PO TABS
50.0000 mg | ORAL_TABLET | Freq: Four times a day (QID) | ORAL | Status: DC | PRN
Start: 2015-06-26 — End: 2016-02-29

## 2015-06-26 MED ORDER — CEFPODOXIME PROXETIL 200 MG PO TABS: 200.0000 mg | ORAL_TABLET | Freq: Two times a day (BID) | ORAL | Status: AC

## 2015-06-26 MED ORDER — DOCUSATE SODIUM 100 MG PO CAPS: 100.0000 mg | ORAL_CAPSULE | Freq: Two times a day (BID) | ORAL | Status: DC

## 2015-06-26 MED ORDER — CEFPODOXIME PROXETIL 200 MG PO TABS
200.0000 mg | ORAL_TABLET | Freq: Two times a day (BID) | ORAL | Status: DC
  Filled 2015-06-26 (×2): qty 1

## 2015-06-27 ENCOUNTER — Encounter (HOSPITAL_COMMUNITY): Payer: Self-pay | Admitting: Urology

## 2015-07-08 ENCOUNTER — Other Ambulatory Visit: Payer: Self-pay | Admitting: Cardiology

## 2015-07-13 ENCOUNTER — Other Ambulatory Visit: Payer: Self-pay | Admitting: Urology

## 2015-07-15 NOTE — Patient Instructions (Signed)
ENDA SANTO  07/15/2015   Your procedure is scheduled on:    07/21/2015    Report to [facility] Main  Entrance take [place]  elevators to 3rd floor to  [place] at      [place] AM.  Call this number if you have problems the morning of surgery [phone]   Remember: ONLY 1 PERSON MAY GO WITH YOU TO SHORT STAY TO GET  READY MORNING OF [place].  Do not eat food or drink liquids :After Midnight.     Take these medicines the morning of surgery with A SIP OF WATER:   Valium if needed, Metoprolol ( Lopressor)                               You may not have any metal on your body including hair pins and              piercings  Do not wear jewelry, make-up, lotions, powders or perfumes, deodorant             Do not wear nail polish.  Do not shave  48 hours prior to surgery.                 Do not bring valuables to the hospital. [place].  Contacts, dentures or bridgework may not be worn into surgery.       Patients discharged the day of surgery will not be allowed to drive home.  Name and phone number of your driver:  Special Instructions: coughing and deep breathing exercises, leg exercises               Please read over the following fact sheets you were given: _____________________________________________________________________             [facility] - Preparing for Surgery Before surgery, you can play an important role.  Because skin is not sterile, your skin needs to be as free of germs as possible.  You can reduce the number of germs on your skin by washing with CHG (chlorahexidine gluconate) soap before surgery.  CHG is an antiseptic cleaner which kills germs and bonds with the skin to continue killing germs even after washing. Please DO NOT use if you have an allergy to CHG or antibacterial soaps.  If your skin becomes reddened/irritated stop using the CHG and inform your nurse when you arrive at  Short Stay. Do not shave (including legs and underarms) for at least 48 hours prior to the first CHG shower.  You may shave your face/neck. Please follow these instructions carefully:  1.  Shower with CHG Soap the night before surgery and the  morning of Surgery.  2.  If you choose to wash your hair, wash your hair first as usual with your  normal  shampoo.  3.  After you shampoo, rinse your hair and body thoroughly to remove the  shampoo.                           4.  Use CHG as you would any other liquid soap.  You can apply chg directly  to the skin and wash  Gently with a scrungie or clean washcloth.  5.  Apply the CHG Soap to your body ONLY FROM THE NECK DOWN.   Do not use on face/ open                           Wound or open sores. Avoid contact with eyes, ears mouth and genitals (private parts).                       Wash face,  Genitals (private parts) with your normal soap.             6.  Wash thoroughly, paying special attention to the area where your surgery  will be performed.  7.  Thoroughly rinse your body with warm water from the neck down.  8.  DO NOT shower/wash with your normal soap after using and rinsing off  the CHG Soap.                9.  Pat yourself dry with a clean towel.            10.  Wear clean pajamas.            11.  Place clean sheets on your bed the night of your first shower and do not  sleep with pets. Day of Surgery : Do not apply any lotions/deodorants the morning of surgery.  Please wear clean clothes to the hospital/surgery center.  FAILURE TO FOLLOW THESE INSTRUCTIONS MAY RESULT IN THE CANCELLATION OF YOUR SURGERY PATIENT SIGNATURE_________________________________  NURSE SIGNATURE__________________________________  ________________________________________________________________________

## 2015-07-18 ENCOUNTER — Encounter (HOSPITAL_COMMUNITY): Payer: Self-pay

## 2015-07-18 ENCOUNTER — Encounter (HOSPITAL_COMMUNITY)
Admission: RE | Admit: 2015-07-18 | Discharge: 2015-07-18 | Disposition: A | Discharge status: Home / Self Care | Payer: Medicare Other | Source: Ambulatory Visit | Attending: Urology | Admitting: Urology

## 2015-07-18 DIAGNOSIS — F0280 Dementia in other diseases classified elsewhere without behavioral disturbance: Secondary | ICD-10-CM | POA: Diagnosis not present

## 2015-07-18 DIAGNOSIS — E785 Hyperlipidemia, unspecified: Secondary | ICD-10-CM | POA: Diagnosis not present

## 2015-07-18 DIAGNOSIS — G309 Alzheimer's disease, unspecified: Secondary | ICD-10-CM | POA: Diagnosis not present

## 2015-07-18 DIAGNOSIS — M1990 Unspecified osteoarthritis, unspecified site: Secondary | ICD-10-CM | POA: Diagnosis not present

## 2015-07-18 DIAGNOSIS — Z87891 Personal history of nicotine dependence: Secondary | ICD-10-CM | POA: Diagnosis not present

## 2015-07-18 DIAGNOSIS — Z87442 Personal history of urinary calculi: Secondary | ICD-10-CM | POA: Diagnosis not present

## 2015-07-18 DIAGNOSIS — I509 Heart failure, unspecified: Secondary | ICD-10-CM | POA: Diagnosis not present

## 2015-07-18 DIAGNOSIS — N200 Calculus of kidney: Secondary | ICD-10-CM | POA: Diagnosis not present

## 2015-07-18 DIAGNOSIS — I10 Essential (primary) hypertension: Secondary | ICD-10-CM | POA: Diagnosis not present

## 2015-07-18 DIAGNOSIS — Z96611 Presence of right artificial shoulder joint: Secondary | ICD-10-CM | POA: Diagnosis not present

## 2015-07-18 DIAGNOSIS — I4891 Unspecified atrial fibrillation: Secondary | ICD-10-CM | POA: Diagnosis not present

## 2015-07-18 LAB — CBC
HCT: 43.5 % (ref 36.0–46.0)
Hemoglobin: 14.1 g/dL (ref 12.0–15.0)
MCH: 28.5 pg (ref 26.0–34.0)
MCHC: 32.4 g/dL (ref 30.0–36.0)
MCV: 87.9 fL (ref 78.0–100.0)
PLATELETS: 291 10*3/uL (ref 150–400)
RBC: 4.95 MIL/uL (ref 3.87–5.11)
RDW: 15.1 % (ref 11.5–15.5)
WBC: 11.5 10*3/uL — ABNORMAL HIGH (ref 4.0–10.5)

## 2015-07-18 LAB — BASIC METABOLIC PANEL
Anion gap: 8 (ref 5–15)
BUN: 20 mg/dL (ref 6–20)
CO2: 26 mmol/L (ref 22–32)
CREATININE: 0.85 mg/dL (ref 0.44–1.00)
Calcium: 9.9 mg/dL (ref 8.9–10.3)
Chloride: 106 mmol/L (ref 101–111)
GFR calc Af Amer: >60 mL/min (ref >60)
GLUCOSE: 132 mg/dL — AB (ref 65–99)
POTASSIUM: 4.2 mmol/L (ref 3.5–5.1)
Sodium: 140 mmol/L (ref 135–145)

## 2015-07-18 NOTE — Progress Notes (Signed)
CBC done 07/18/2015 faxed via EPIC to Dr Diona Fanti.

## 2015-07-18 NOTE — Progress Notes (Addendum)
08/18/14- 2V CXR- [facility]  12/2012 ECHO- [facility]  10/2014- LOV- Dr Mare Ferrari in [facility]

## 2015-07-18 NOTE — Progress Notes (Signed)
Patient had episode of nausea and vomiting at time of preop appointment on 07/18/2015.  No blood noted in emesis.  Only note yellow emesis with food particles.  Patient recovered .  No further episodes at time of preop appointment.  Husband was present at time of preop appointment.  Husband, Fritz Pickerel aware to report any further episodes of nausea and vomiting to Dr Diona Fanti.  Spoke with Gwyn at office of Dr Diona Fanti and she was made aware and she stated she would inform Dr Diona Fanti.

## 2015-07-20 NOTE — H&P (Signed)
H&P  Chief Complaint: Kidney stones  History of Present Illness: Penny [name] is a 81 y.o. year old female who presents for ureteroscopic management of right renal calculi. She has moderate sized stones in both upper and lower pole moieties of a partially duplicated right kidney. She originally presented with an obstructing UPJ stone which was significantly symptomatic. Stents were placed in both ureters due to the presence of pyuria. She has been adequately treated for a UTI w/ levaquin and now nitrofurantoin suppression.  Past Medical History  Diagnosis Date  . Long-term (current) use of anticoagulants   . Chronic shoulder pain   . Heart palpitations     rare  . Memory problem     "a little short term and long term memory loss" (03/31/2014)  . Depression   . Hyperlipidemia   . Dyslipidemia   . Seasonal allergies   . Anxiety   . Hypertension   . CHF (congestive heart failure)   . Atrial fibrillation   . Humerus fracture 03/31/2014    "fell and broke left arm"  . Kidney stones   . Anginal pain   . Blood in urine     "chronic" (03/31/2014)  . Arthritis     "joints" (03/31/2014)  . Osteopenia     "severe" (03/31/2014)  . Syncope and collapse 03/31/2014  . Alzheimer's dementia 03/31/2014  . Dysrhythmia     atrail fib   . Hx of transfusion of packed red blood cells     Past Surgical History  Procedure Laterality Date  . Ureteral stent placement Bilateral     for stenosis; "they've been removed"  . Removal of growth  2007    growth under kidney removed and possibly kindey stones removed  . Knee arthroscopy Right 2011  . Total shoulder arthroplasty Right 10/2007  . Nephrolithotomy Right 06/04/2013    Procedure: NEPHROLITHOTOMY PERCUTANEOUS;  Surgeon: Franchot Gallo, MD;  Location: [facility];  Service: Urology;  Laterality: Right;  . Appendectomy  ~ 1953  . Bladder suspension      w/hysterectomy  . Cardioversion  2006  . Meniscus repair Left 07/2000  . Cystoscopy with  retrograde pyelogram, ureteroscopy and stent placement Bilateral 08/26/2014    Procedure: CYSTOSCOPY WITH RETROGRADE PYELOGRAM, URETEROSCOPY , EXTRACTION OF STONES AND STENT PLACEMENT;  Surgeon: Jorja Loa, MD;  Location: [facility];  Service: Urology;  Laterality: Bilateral;  . Holmium laser application Left 96/05/8937    Procedure: HOLMIUM LASER APPLICATION;  Surgeon: Jorja Loa, MD;  Location: [facility];  Service: Urology;  Laterality: Left;  . Cystoscopy/retrograde/ureteroscopy/stone extraction with basket Right 10/08/2014    Procedure: CYSTOSCOPY, retrograde pyelogram,URETEROSCOPY, laser treatment of calculus, stent exchange;  Surgeon: Jorja Loa, MD;  Location: [facility];  Service: Urology;  Laterality: Right;  . Cystoscopy with stent placement Right 06/24/2015    Procedure: CYSTOSCOPY URETEROSCOPY, PYLEOGRAM, WITH STENT PLACEMENT X2 ;  Surgeon: Franchot Gallo, MD;  Location: [facility];  Service: Urology;  Laterality: Right;    Home Medications:  No prescriptions prior to admission    Allergies:  Allergies  Allergen Reactions  . Sulfa Antibiotics Rash  . Zocor [Simvastatin] Other (See Comments)    Family History  Problem Relation Age of Onset  . Heart disease Mother     Social History:  reports that she quit smoking about 34 years ago. Her smoking use included Cigarettes. She has a 1.25 pack-year smoking history. She has never used smokeless tobacco. She reports that she does not  drink alcohol or use illicit drugs.  ROS: A complete review of systems was performed.  All systems are negative except for pertinent findings as noted.  Physical Exam:  Vital signs in last 24 hours:   General:  Alert and oriented, No acute distress HEENT: Normocephalic, atraumatic Neck: No JVD or lymphadenopathy Cardiovascular: Regular rate and rhythm Lungs: Clear bilaterally Abdomen: Soft, nontender, nondistended, no abdominal masses Back: No CVA tenderness Extremities: No  edema Neurologic: Grossly intact. Short term memory loss  Laboratory Data:  No results found for this or any previous visit (from the past 24 hour(s)). No results found for this or any previous visit (from the past 240 hour(s)). Creatinine:  Recent Labs  07/18/15 1430  CREATININE 0.85    Radiologic Imaging: No results found.  Impression/Assessment:  Right renal calculi  Plan:  Cysto, right J2 stent removal, right ureteroscopy, HLL and extraction of renal calculi, J2 stents  Richey Doolittle M 07/20/2015, 8:11 PM  Lillette Boxer. Jud Fanguy MD

## 2015-07-20 NOTE — Anesthesia Preprocedure Evaluation (Addendum)
Anesthesia Evaluation  Patient identified by MRN, date of birth, ID band Patient awake    Reviewed: Allergy & Precautions, H&P , NPO status , Patient's Chart, lab work & pertinent test results, reviewed documented beta blocker date and time   Airway Mallampati: II  TM Distance: >3 FB Neck ROM: Full    Dental no notable dental hx.    Pulmonary former smoker,  breath sounds clear to auscultation  Pulmonary exam normal       Cardiovascular hypertension, Pt. on home beta blockers + angina +CHF Normal cardiovascular exam+ dysrhythmias Atrial Fibrillation Rhythm:Regular Rate:Normal     Neuro/Psych PSYCHIATRIC DISORDERS Anxiety Depression Dementia     GI/Hepatic negative GI ROS, Neg liver ROS,   Endo/Other  negative endocrine ROS  Renal/GU Renal disease     Musculoskeletal  (+) Arthritis -,   Abdominal   Peds  Hematology  (+) anemia ,   Anesthesia Other Findings Recent Echo with normal EF, no sig valvular abnormalities Patient with significant alzheimers dementia, consent obtained with husband at bedside  NPO appropriate, allergies reviewed Denies active cardiac or pulmonary symptoms No recent congestive cough or symptoms of upper respiratory infection Meds - xarelto held for procedure (takes for a-fib)  Reproductive/Obstetrics negative OB ROS                            Anesthesia Physical  Anesthesia Plan  ASA: III  Anesthesia Plan: General   Post-op Pain Management:    Induction: Intravenous  Airway Management Planned: LMA  Additional Equipment:   Intra-op Plan:   Post-operative Plan: Extubation in OR  Informed Consent: I have reviewed the patients History and Physical, chart, labs and discussed the procedure including the risks, benefits and alternatives for the proposed anesthesia with the patient or authorized representative who has indicated his/her understanding and  acceptance.   Dental advisory given  Plan Discussed with: CRNA, Anesthesiologist and Surgeon  Anesthesia Plan Comments:        Anesthesia Quick Evaluation

## 2015-07-21 ENCOUNTER — Ambulatory Visit (HOSPITAL_COMMUNITY): Payer: Medicare Other | Admitting: Anesthesiology

## 2015-07-21 ENCOUNTER — Encounter (HOSPITAL_COMMUNITY): Payer: Self-pay | Admitting: *Deleted

## 2015-07-21 ENCOUNTER — Observation Stay (HOSPITAL_COMMUNITY)
Admission: RE | Admit: 2015-07-21 | Discharge: 2015-07-22 | Disposition: A | Discharge status: Home / Self Care | Payer: Medicare Other | Attending: Urology | Admitting: Urology

## 2015-07-21 ENCOUNTER — Encounter (HOSPITAL_COMMUNITY): Admission: RE | Disposition: A | Discharge status: Home / Self Care | Payer: Self-pay | Attending: Urology

## 2015-07-21 DIAGNOSIS — I509 Heart failure, unspecified: Secondary | ICD-10-CM | POA: Diagnosis not present

## 2015-07-21 DIAGNOSIS — G309 Alzheimer's disease, unspecified: Secondary | ICD-10-CM | POA: Insufficient documentation

## 2015-07-21 DIAGNOSIS — Z96611 Presence of right artificial shoulder joint: Secondary | ICD-10-CM | POA: Insufficient documentation

## 2015-07-21 DIAGNOSIS — N200 Calculus of kidney: Principal | ICD-10-CM

## 2015-07-21 DIAGNOSIS — I10 Essential (primary) hypertension: Secondary | ICD-10-CM | POA: Diagnosis not present

## 2015-07-21 DIAGNOSIS — F0280 Dementia in other diseases classified elsewhere without behavioral disturbance: Secondary | ICD-10-CM | POA: Insufficient documentation

## 2015-07-21 DIAGNOSIS — I4891 Unspecified atrial fibrillation: Secondary | ICD-10-CM | POA: Insufficient documentation

## 2015-07-21 DIAGNOSIS — Z87891 Personal history of nicotine dependence: Secondary | ICD-10-CM | POA: Insufficient documentation

## 2015-07-21 DIAGNOSIS — E785 Hyperlipidemia, unspecified: Secondary | ICD-10-CM | POA: Diagnosis not present

## 2015-07-21 DIAGNOSIS — M1990 Unspecified osteoarthritis, unspecified site: Secondary | ICD-10-CM | POA: Insufficient documentation

## 2015-07-21 DIAGNOSIS — Z87442 Personal history of urinary calculi: Secondary | ICD-10-CM | POA: Insufficient documentation

## 2015-07-21 HISTORY — PX: HOLMIUM LASER APPLICATION: SHX5852

## 2015-07-21 HISTORY — PX: CYSTOSCOPY WITH URETEROSCOPY, STONE BASKETRY AND STENT PLACEMENT: SHX6378

## 2015-07-21 SURGERY — CYSTOSCOPY, WITH CALCULUS MANIPULATION OR REMOVAL
Anesthesia: General | Site: Ureter | Laterality: Right

## 2015-07-21 MED ORDER — LIDOCAINE HCL (CARDIAC) 20 MG/ML IV SOLN
INTRAVENOUS | Status: AC
  Filled 2015-07-21: qty 5

## 2015-07-21 MED ORDER — ESMOLOL HCL 10 MG/ML IV SOLN
INTRAVENOUS | Status: DC | PRN
  Administered 2015-07-21 (×3): 5 mg via INTRAVENOUS

## 2015-07-21 MED ORDER — HYDROCODONE-ACETAMINOPHEN 5-325 MG PO TABS: 1.0000 | ORAL_TABLET | ORAL | Status: DC | PRN

## 2015-07-21 MED ORDER — IOHEXOL 300 MG/ML  SOLN
INTRAMUSCULAR | Status: DC | PRN
  Administered 2015-07-21: 25 mL

## 2015-07-21 MED ORDER — DIGOXIN 125 MCG PO TABS
0.1250 mg | ORAL_TABLET | ORAL | Status: DC
  Administered 2015-07-22: 0.125 mg via ORAL
  Filled 2015-07-21: qty 1

## 2015-07-21 MED ORDER — 0.9 % SODIUM CHLORIDE (POUR BTL) OPTIME
TOPICAL | Status: DC | PRN
  Administered 2015-07-21: 1000 mL

## 2015-07-21 MED ORDER — PROPOFOL 10 MG/ML IV BOLUS
INTRAVENOUS | Status: DC | PRN
  Administered 2015-07-21: 120 mg via INTRAVENOUS

## 2015-07-21 MED ORDER — CEFAZOLIN SODIUM-DEXTROSE 2-3 GM-% IV SOLR
2.0000 g | INTRAVENOUS | Status: AC
  Administered 2015-07-21: 2 g via INTRAVENOUS

## 2015-07-21 MED ORDER — ROSUVASTATIN CALCIUM 5 MG PO TABS
2.5000 mg | ORAL_TABLET | Freq: Every day | ORAL | Status: DC
  Administered 2015-07-21: 2.5 mg via ORAL
  Filled 2015-07-21: qty 1

## 2015-07-21 MED ORDER — DEXAMETHASONE SODIUM PHOSPHATE 10 MG/ML IJ SOLN
INTRAMUSCULAR | Status: DC | PRN
  Administered 2015-07-21: 10 mg via INTRAVENOUS

## 2015-07-21 MED ORDER — CEFAZOLIN SODIUM-DEXTROSE 2-3 GM-% IV SOLR
INTRAVENOUS | Status: AC
  Filled 2015-07-21: qty 50

## 2015-07-21 MED ORDER — ONDANSETRON HCL 4 MG/2ML IJ SOLN
INTRAMUSCULAR | Status: AC
  Filled 2015-07-21: qty 2

## 2015-07-21 MED ORDER — ACETAMINOPHEN 500 MG PO TABS: 500.0000 mg | ORAL_TABLET | Freq: Two times a day (BID) | ORAL | Status: DC | PRN

## 2015-07-21 MED ORDER — ESMOLOL HCL 10 MG/ML IV SOLN
INTRAVENOUS | Status: AC
  Filled 2015-07-21: qty 10

## 2015-07-21 MED ORDER — POTASSIUM CHLORIDE ER 10 MEQ PO TBCR
10.0000 meq | EXTENDED_RELEASE_TABLET | Freq: Every day | ORAL | Status: DC
  Administered 2015-07-21 – 2015-07-22 (×2): 10 meq via ORAL
  Filled 2015-07-21 (×4): qty 1

## 2015-07-21 MED ORDER — DONEPEZIL HCL 5 MG PO TABS
10.0000 mg | ORAL_TABLET | Freq: Every day | ORAL | Status: DC
  Administered 2015-07-21: 10 mg via ORAL
  Filled 2015-07-21: qty 2

## 2015-07-21 MED ORDER — DOCUSATE SODIUM 100 MG PO CAPS
100.0000 mg | ORAL_CAPSULE | Freq: Two times a day (BID) | ORAL | Status: DC
  Administered 2015-07-21: 100 mg via ORAL
  Filled 2015-07-21: qty 1

## 2015-07-21 MED ORDER — SODIUM CHLORIDE 0.9 % IV SOLN: INTRAVENOUS | Status: DC

## 2015-07-21 MED ORDER — FENTANYL CITRATE (PF) 100 MCG/2ML IJ SOLN
INTRAMUSCULAR | Status: DC | PRN
  Administered 2015-07-21 (×2): 25 ug via INTRAVENOUS
  Administered 2015-07-21: 50 ug via INTRAVENOUS

## 2015-07-21 MED ORDER — CEPHALEXIN 500 MG PO CAPS
500.0000 mg | ORAL_CAPSULE | Freq: Three times a day (TID) | ORAL | Status: DC
  Administered 2015-07-21 – 2015-07-22 (×3): 500 mg via ORAL
  Filled 2015-07-21 (×3): qty 1

## 2015-07-21 MED ORDER — PHENYLEPHRINE HCL 10 MG/ML IJ SOLN
INTRAMUSCULAR | Status: DC | PRN
  Administered 2015-07-21 (×2): 80 ug via INTRAVENOUS
  Administered 2015-07-21 (×2): 160 ug via INTRAVENOUS
  Administered 2015-07-21 (×2): 80 ug via INTRAVENOUS

## 2015-07-21 MED ORDER — RIVAROXABAN 20 MG PO TABS
20.0000 mg | ORAL_TABLET | Freq: Every day | ORAL | Status: DC
  Administered 2015-07-21: 20 mg via ORAL
  Filled 2015-07-21: qty 1

## 2015-07-21 MED ORDER — CEFAZOLIN SODIUM 1-5 GM-% IV SOLN: 1.0000 g | INTRAVENOUS | Status: DC

## 2015-07-21 MED ORDER — PHENYLEPHRINE 40 MCG/ML (10ML) SYRINGE FOR IV PUSH (FOR BLOOD PRESSURE SUPPORT)
PREFILLED_SYRINGE | INTRAVENOUS | Status: AC
  Filled 2015-07-21: qty 20

## 2015-07-21 MED ORDER — DEXAMETHASONE SODIUM PHOSPHATE 10 MG/ML IJ SOLN
INTRAMUSCULAR | Status: AC
  Filled 2015-07-21: qty 1

## 2015-07-21 MED ORDER — FENTANYL CITRATE (PF) 100 MCG/2ML IJ SOLN
25.0000 ug | INTRAMUSCULAR | Status: DC | PRN
  Administered 2015-07-21 (×2): 25 ug via INTRAVENOUS

## 2015-07-21 MED ORDER — ONDANSETRON HCL 4 MG/2ML IJ SOLN
4.0000 mg | INTRAMUSCULAR | Status: DC | PRN
  Administered 2015-07-21: 4 mg via INTRAVENOUS
  Filled 2015-07-21: qty 2

## 2015-07-21 MED ORDER — ENSURE ENLIVE PO LIQD: 237.0000 mL | Freq: Two times a day (BID) | ORAL | Status: DC

## 2015-07-21 MED ORDER — LIDOCAINE HCL 2 % EX GEL
CUTANEOUS | Status: AC
  Filled 2015-07-21: qty 10

## 2015-07-21 MED ORDER — LACTATED RINGERS IV SOLN
INTRAVENOUS | Status: DC | PRN
  Administered 2015-07-21: 07:00:00 via INTRAVENOUS

## 2015-07-21 MED ORDER — DOCUSATE SODIUM 100 MG PO CAPS
100.0000 mg | ORAL_CAPSULE | Freq: Two times a day (BID) | ORAL | Status: DC
  Administered 2015-07-21 – 2015-07-22 (×2): 100 mg via ORAL
  Filled 2015-07-21 (×2): qty 1

## 2015-07-21 MED ORDER — LACTATED RINGERS IV SOLN: INTRAVENOUS | Status: DC

## 2015-07-21 MED ORDER — SODIUM CHLORIDE 0.9 % IJ SOLN
INTRAMUSCULAR | Status: AC
  Filled 2015-07-21: qty 10

## 2015-07-21 MED ORDER — FENTANYL CITRATE (PF) 100 MCG/2ML IJ SOLN
INTRAMUSCULAR | Status: AC
  Filled 2015-07-21: qty 4

## 2015-07-21 MED ORDER — EPHEDRINE SULFATE 50 MG/ML IJ SOLN
INTRAMUSCULAR | Status: AC
  Filled 2015-07-21: qty 1

## 2015-07-21 MED ORDER — OXYBUTYNIN CHLORIDE 5 MG PO TABS: 5.0000 mg | ORAL_TABLET | Freq: Three times a day (TID) | ORAL | Status: DC | PRN

## 2015-07-21 MED ORDER — ONDANSETRON HCL 4 MG/2ML IJ SOLN
INTRAMUSCULAR | Status: DC | PRN
  Administered 2015-07-21: 4 mg via INTRAVENOUS

## 2015-07-21 MED ORDER — FENTANYL CITRATE (PF) 100 MCG/2ML IJ SOLN
INTRAMUSCULAR | Status: AC
  Filled 2015-07-21: qty 2

## 2015-07-21 MED ORDER — MIRTAZAPINE 15 MG PO TABS: 15.0000 mg | ORAL_TABLET | Freq: Every evening | ORAL | Status: DC | PRN

## 2015-07-21 MED ORDER — BELLADONNA ALKALOIDS-OPIUM 16.2-60 MG RE SUPP
RECTAL | Status: AC
Start: 2015-07-21 — End: 2015-07-21
  Filled 2015-07-21: qty 1

## 2015-07-21 MED ORDER — SODIUM CHLORIDE 0.9 % IR SOLN
Status: DC | PRN
  Administered 2015-07-21: 4000 mL

## 2015-07-21 MED ORDER — ONDANSETRON HCL 4 MG/2ML IJ SOLN: 4.0000 mg | Freq: Once | INTRAMUSCULAR | Status: DC | PRN

## 2015-07-21 MED ORDER — DEXTROSE-NACL 5-0.45 % IV SOLN
INTRAVENOUS | Status: DC
  Administered 2015-07-21 – 2015-07-22 (×2): via INTRAVENOUS

## 2015-07-21 MED ORDER — PROPOFOL 10 MG/ML IV BOLUS
INTRAVENOUS | Status: AC
  Filled 2015-07-21: qty 20

## 2015-07-21 MED ORDER — DIAZEPAM 5 MG PO TABS: 2.5000 mg | ORAL_TABLET | Freq: Two times a day (BID) | ORAL | Status: DC | PRN

## 2015-07-21 MED ORDER — LIDOCAINE HCL (CARDIAC) 20 MG/ML IV SOLN
INTRAVENOUS | Status: DC | PRN
Start: 2015-07-21 — End: 2015-07-21
  Administered 2015-07-21: 60 mg via INTRAVENOUS

## 2015-07-21 SURGICAL SUPPLY — 18 items
BAG URO CATCHER STRL LF (DRAPE) ×4 IMPLANT
CATH INTERMIT  6FR 70CM (CATHETERS) ×4 IMPLANT
CLOTH BEACON ORANGE TIMEOUT ST (SAFETY) ×4 IMPLANT
EXTRACTOR STONE NITINOL NGAGE (UROLOGICAL SUPPLIES) ×4 IMPLANT
FIBER LASER FLEXIVA 1000 (UROLOGICAL SUPPLIES) IMPLANT
FIBER LASER FLEXIVA 200 (UROLOGICAL SUPPLIES) ×4 IMPLANT
FIBER LASER FLEXIVA 365 (UROLOGICAL SUPPLIES) IMPLANT
FIBER LASER FLEXIVA 550 (UROLOGICAL SUPPLIES) IMPLANT
FIBER LASER TRAC TIP (UROLOGICAL SUPPLIES) ×4 IMPLANT
GLOVE BIOGEL M 8.0 STRL (GLOVE) ×4 IMPLANT
GOWN STRL REUS W/TWL XL LVL3 (GOWN DISPOSABLE) ×4 IMPLANT
GUIDEWIRE STR DUAL SENSOR (WIRE) ×8 IMPLANT
MANIFOLD NEPTUNE II (INSTRUMENTS) ×4 IMPLANT
PACK CYSTO (CUSTOM PROCEDURE TRAY) ×4 IMPLANT
SHEATH ACCESS URETERAL 38CM (SHEATH) ×4 IMPLANT
TUBING CONNECTING 10 (TUBING) ×3 IMPLANT
TUBING CONNECTING 10' (TUBING) ×1
WIRE COONS/BENSON .038X145CM (WIRE) IMPLANT

## 2015-07-21 NOTE — Interval H&P Note (Signed)
History and Physical Interval Note:  07/21/2015 7:33 AM  Penny [name]  has presented today for surgery, with the diagnosis of RIGHT RENAL CALCULI  The various methods of treatment have been discussed with the patient and family. After consideration of risks, benefits and other options for treatment, the patient has consented to  Procedure(s): CYSTOSCOPY WITH URETEROSCOPY, STONE BASKETRY AND POSSIBLE STENT REPLACEMENTS (Right) HOLMIUM LASER APPLICATION (Right) as a surgical intervention .  The patient's history has been reviewed, patient examined, no change in status, stable for surgery.  I have reviewed the patient's chart and labs.  Questions were answered to the patient's satisfaction.     Jorja Loa

## 2015-07-21 NOTE — Discharge Instructions (Signed)
POSTOPERATIVE CARE AFTER URETEROSCOPY ° °Stent management ° °*Stents are often left in after ureteroscopy and stone treatment. If left in, they often cause urinary frequency, urgency, occasional blood in the urine, as well as flank discomfort with urination. These are all expected issues, and should resolve after the stent is removed. °*Often times, a small thread is left on the end of the stent, and brought out through the urethra. If so, this is used to remove the stent, making it unnecessary to look in the bladder with a scope in the office to remove the stent. If a thread is left on, did not pull on it until instructed. ° °Diet ° °Once you have adequately recovered from anesthesia, you may gradually advance your diet, as tolerated, to your regular diet. ° °Activities ° °You may gradually increase your activities to your normal unrestricted level the day following your procedure. ° °Medications ° °You should resume all preoperative medications. If you are on aspirin-like compounds, you should not resume these until the blood clears from your urine. If given an antibiotic by the surgeon, take these until they are completed. You may also be given, if you have a stent, medications to decrease the urinary frequency and urgency. ° °Pain ° °After ureteroscopy, there may be some pain on the side of the scope. Take your pain medicine for this. Usually, this pain resolves within a day or 2. ° °Fever ° °Please report any fever over 100° to the doctor. ° °

## 2015-07-21 NOTE — Transfer of Care (Signed)
Immediate Anesthesia Transfer of Care Note  Patient: Penny [name]  Procedure(s) Performed: Procedure(s): CYSTOSCOPY WITH  right URETEROSCOPY, STONE BASKETRY bilateral pylegram  removal bilateral double j stents (Bilateral) HOLMIUM LASER APPLICATION (Right)  Patient Location: PACU  Anesthesia Type:General  Level of Consciousness:  sedated, patient cooperative and responds to stimulation  Airway & Oxygen Therapy:Patient Spontanous Breathing and Patient connected to face mask oxgen  Post-op Assessment:  Report given to PACU RN and Post -op Vital signs reviewed and stable  Post vital signs:  Reviewed and stable  Last Vitals:  Filed Vitals:   07/21/15 0609  BP: 121/80  Temp: 36.4 C  Resp: 16    Complications: No apparent anesthesia complications

## 2015-07-21 NOTE — Anesthesia Procedure Notes (Signed)
Procedure Name: LMA Insertion Date/Time: 07/21/2015 7:46 AM Performed by: Montel Clock Pre-anesthesia Checklist: Patient identified, Emergency Drugs available, Suction available, Patient being monitored and Timeout performed Patient Re-evaluated:Patient Re-evaluated prior to inductionOxygen Delivery Method: Circle system utilized Preoxygenation: Pre-oxygenation with 100% oxygen Intubation Type: IV induction Ventilation: Mask ventilation without difficulty LMA: LMA with gastric port inserted LMA Size: 3.0 Number of attempts: 1 Dental Injury: Teeth and Oropharynx as per pre-operative assessment

## 2015-07-21 NOTE — Op Note (Signed)
PATIENT:  Penny [name]  PRE-OPERATIVE DIAGNOSIS: Right renal calculi  POST-OPERATIVE DIAGNOSIS: Same  PROCEDURE: Cystoscopy, right double-J stent removal, bilateral retrograde ureteropyelograms with interpretive fluoroscopy, right ureteroscopy of both right renal moieties (duplicated system partially), holmium laser lithotripsy and extraction of renal calculi  SURGEON:  Lillette Boxer. Auron Tadros, M.D.  ANESTHESIA:  General  EBL:  Minimal  DRAINS: None  LOCAL MEDICATIONS USED:  None  SPECIMEN:  Stone fragments, to the patient's family  INDICATION: Penny [name] is an elderly female with recurrent urolithiasis. She was recently admitted urgently for significant flank pain and possible associated infection. She did have pyuria at the time of her visit, and underwent urgent cystoscopy, placement of stents in both right renal moieties with the assistance of ureteroscopy due to an impacted stone in the upper pole moiety. At this point, she presents for ureteroscopic management of these right renal calculi. The patient was originally treated for a urinary tract infection appropriately with a course of oral antibiotic, and has been on suppression since that time. I have discussed surgical management of the stones with the patient and her family, who desire to proceed.  Description of procedure: The patient was properly identified and marked (if applicable) in the holding area. They were then  taken to the operating room and placed on the table in a supine position. General anesthesia was then administered. Once fully anesthetized the patient was moved to the dorsolithotomy position and the genitalia and perineum were sterilely prepped and draped in standard fashion. An official timeout was then performed.  A 23 French panendoscope was advanced into her bladder. The right ureteral stents were encountered. Urine was sent for culture. I then removed both stents using the grasper. I advanced the guidewire  into the lower pole ureter, which was advanced into the lower pole renal pelvis. The cystoscope was removed and the ureter was accessed first with the inner core of a 12/14 ureteral access sheath, then with the entire sheath. The core was removed. The flexible ureteroscope was advanced into the sheath and into the renal pelvis. The entire pyelocalyceal system was inspected. There was one stone layering posteriorly in a calyx. Careful inspection of the other 3-4 calyces in the lower pole moiety failed to reveal any further calculi. The stone was then fragmented into 2-3 smaller fragments with the 200  fiber using the holmium laser energy set at 10 Hz and a power of 0.5 J. The fragments were then removed using the engage basket. Tiny fragments were also extracted such that I could not see any further fragments, even same-like fragments, remaining calyces following inspection.  The ureteroscope was then backed out of the lower pole ureteral moiety. I then encountered the upper pole ureter, and advanced the flexible scope up into the ureter. It was eventually advanced all the way into the pyelo-calyceal system. The ureteral access sheath was then advanced over top of this. I inspected the entire pyelocalyceal system. No stones were seen. This was worrisome, as I had felt like the larger of the stones in the kidney were in the upper pole moiety. Careful inspection and retrograde ureteropyelogram with the scope at the UPJ failed reveal any filling defects and confirmed the calyceal anatomy. I then backed the scope down the ureter. There, a large stone was encountered. It was approximately 6 mm in size. Because of the ureteral dilatation, I felt like I could grasp the stone and remove it. The engage basket was used to grasp the stone, it  was easily extracted without any tension. I then backed the scope out the rest of the ureters. I could not see any further stones. For confirmation sake, retrograde ureteropyelogram was  performed using the cystoscope after the ureteroscope was removed. This revealed mild narrowing of the proximal common ureter, no further filling defects were seen. I then passed the rigid ureteroscope up both ureteral moieties. One small fragment was seen remaining and it was grasped and extracted.  Because of the calcification seen overlying the area of the left renal contour, I then performed a left retrograde ureteropyelogram. This revealed a normal ureter, normal pyelo-calyceal system without filling defects, stricture or hydronephrosis. The calcification was outside the confines of the left pyelo-calyceal system. Because of the minimal trauma to the ureters, as well as the prior presence of stents, I did not feel like the patient needed to be stented. The bladder was drained and the procedure terminated. The patient was awakened and taken to the PACU in stable condition.  She will remain overnight, as she does have cognitive dysfunction.

## 2015-07-21 NOTE — Anesthesia Postprocedure Evaluation (Signed)
  Anesthesia Post-op Note  Patient: Penny [name]  Procedure(s) Performed: Procedure(s) (LRB): CYSTOSCOPY WITH  right URETEROSCOPY, STONE BASKETRY bilateral pylegram  removal bilateral double j stents (Bilateral) HOLMIUM LASER APPLICATION (Right)  Patient Location: PACU  Anesthesia Type: General  Level of Consciousness: awake and alert   Airway and Oxygen Therapy: Patient Spontanous Breathing  Post-op Pain: mild  Post-op Assessment: Post-op Vital signs reviewed, Patient's Cardiovascular Status Stable, Respiratory Function Stable, Patent Airway and No signs of Nausea or vomiting  Last Vitals:  Filed Vitals:   07/21/15 0900  BP:   Temp: 36.4 C  Resp: 15    Post-op Vital Signs: stable   Complications: No apparent anesthesia complications

## 2015-07-22 ENCOUNTER — Encounter (HOSPITAL_COMMUNITY): Payer: Self-pay | Admitting: Urology

## 2015-07-22 DIAGNOSIS — N200 Calculus of kidney: Secondary | ICD-10-CM | POA: Diagnosis not present

## 2015-07-22 NOTE — Discharge Summary (Signed)
Patient ID: Penny [name] MRN: [id number] DOB/AGE: [id number] 79 y.o.  Admit date: 07/21/2015 Discharge date: 07/22/2015  Primary Care Physician:  Marton Redwood, MD  Discharge Diagnoses: Right renal calculi  Consults:  None     Discharge Medications:   Medication List    STOP taking these medications        doxycycline 100 MG tablet  Commonly known as:  VIBRA-TABS      TAKE these medications        acetaminophen 500 MG tablet  Commonly known as:  TYLENOL  Take 500 mg by mouth 2 (two) times daily as needed for mild pain.     cholecalciferol 1000 UNITS tablet  Commonly known as:  VITAMIN D  Take 1,000 Units by mouth daily.     diazepam 5 MG tablet  Commonly known as:  VALIUM  Take 2.5-5 mg by mouth every 12 (twelve) hours as needed for anxiety.     digoxin 0.125 MG tablet  Commonly known as:  LANOXIN  Take 0.125 mg by mouth every Monday, Wednesday, and Friday.     docusate sodium 100 MG capsule  Commonly known as:  COLACE  Take 1 capsule (100 mg total) by mouth 2 (two) times daily.     donepezil 10 MG tablet  Commonly known as:  ARICEPT  Take 10 mg by mouth at bedtime.     FISH OIL PO  Take 1 capsule by mouth daily.     levofloxacin 500 MG tablet  Commonly known as:  LEVAQUIN  Take 500 mg by mouth daily. Started on 07/16/2015     metoprolol tartrate 25 MG tablet  Commonly known as:  LOPRESSOR  TAKE ONE-HALF (12.5 MG TOTAL) TABLET BY MOUTH IN THE MORNING     mirtazapine 15 MG tablet  Commonly known as:  REMERON  Take 15 mg by mouth at bedtime as needed (sleep).     multivitamin with minerals Tabs tablet  Take 1 tablet by mouth daily.     potassium chloride 10 MEQ tablet  Commonly known as:  K-DUR  Take 10 mEq by mouth daily.     rosuvastatin 5 MG tablet  Commonly known as:  CRESTOR  Take 2.5 mg by mouth at bedtime.     traMADol 50 MG tablet  Commonly known as:  ULTRAM  Take 1 tablet (50 mg total) by mouth every 6 (six) hours as needed for moderate  pain.     vitamin C 500 MG tablet  Commonly known as:  ASCORBIC ACID  Take 500 mg by mouth daily.     XARELTO 20 MG Tabs tablet  Generic drug:  rivaroxaban  Take 20 mg by mouth at bedtime.         Significant Diagnostic Studies:  No results found.  Brief H and P: For complete details please refer to admission H and P, but in brief the patient was admitted for ureteroscopic management of stones in both upper and lower pole moieties of a duplicated right kidney. She had been previously stented for an infection and obstruction of the stones.  Hospital Course:  Active Problems:   Kidney stone  the patient was taken the operating room where ureteroscopic management was performed of both of her renal calculi. No stents were left in. She was sent to the floor afterwards where she had an uncomplicated postoperative course.  Day of Discharge BP 113/54 mmHg  Pulse 78  Temp(Src) 98.2 F (36.8 C) (Axillary)  Resp 16  Ht  5\' 5"  (1.651 m)  Wt 61.4 kg (135 lb 5.8 oz)  BMI 22.53 kg/m2  SpO2 99%  No results found for this or any previous visit (from the past 24 hour(s)).  Physical Exam: General: Alert and awake oriented x3 not in any acute distress. HEENT: anicteric sclera, pupils reactive to light and accommodation CVS: S1-S2 clear no murmur rubs or gallops Chest: clear to auscultation bilaterally, no wheezing rales or rhonchi Abdomen: soft nontender, nondistended, normal bowel sounds, no organomegaly Extremities: no cyanosis, clubbing or edema noted bilaterally Neuro: Cranial nerves II-XII intact, no focal neurological deficits  Disposition:  Home  Diet:  No restrictions  Activity:  No restrictions   Disposition and Follow-up:     Discharge Instructions    Discharge patient    Complete by:  As directed             Follow-up in office as scheduled  TESTS THAT NEED FOLLOW-UP  Check on urine culture  DISCHARGE FOLLOW-UP Follow-up Information    Follow up with  Jorja Loa, MD.   Specialty:  Urology   Why:  As scheduled   Contact information:   [place] [id number] [phone]       Time spent on Discharge:  10 minutes  Signed: Jorja Loa 07/22/2015, 6:49 AM

## 2015-07-24 LAB — URINE CULTURE

## 2015-08-31 ENCOUNTER — Encounter: Payer: Self-pay | Admitting: Cardiology

## 2015-08-31 ENCOUNTER — Ambulatory Visit (INDEPENDENT_AMBULATORY_CARE_PROVIDER_SITE_OTHER): Payer: Medicare Other | Admitting: Cardiology

## 2015-08-31 VITALS — BP 130/68 | HR 73 | Ht 65.5 in | Wt 128.0 lb

## 2015-08-31 DIAGNOSIS — I482 Chronic atrial fibrillation, unspecified: Secondary | ICD-10-CM

## 2015-08-31 DIAGNOSIS — F0390 Unspecified dementia without behavioral disturbance: Secondary | ICD-10-CM

## 2015-08-31 DIAGNOSIS — I4510 Unspecified right bundle-branch block: Secondary | ICD-10-CM | POA: Diagnosis not present

## 2015-08-31 DIAGNOSIS — I10 Essential (primary) hypertension: Secondary | ICD-10-CM

## 2015-08-31 NOTE — Progress Notes (Signed)
Cardiology Office Note   Date:  08/31/2015   ID:  Penny [name], DOB April 26, 1929, MRN [id number]  PCP:  Marton Redwood, MD  Cardiologist: Darlin Coco MD  No chief complaint on file.     History of Present Illness: Penny [name] is a 81 y.o. female who presents for a scheduled follow-up visit.  This pleasant 82 year old woman is seen for a scheduled followup office visit. She has a history of chronic atrial fibrillation. She also has a past history of syncope and collapse which occurred at about the beginning of July 2013. She was found to have low blood pressure following that episode and her lisinopril was stopped. Subsequently the patient did well until 01/12/13 when she had another episode of syncope at a restaurant and was hospitalized briefly. She subsequently wore a 30 day event monitor. The event monitor did not show any cause for her syncope. She was in constant atrial fibrillation with a controlled ventricular response and no excessive bradycardia or tachycardia was seen. The patient reports that she has been feeling better. Her daughter who is a Marine scientist also states that she thinks that her mother has been doing better since we cut her back on her digoxin to just a small dose of 0.125 Monday Wednesday and Friday only. The patient was hospitalized with a kidney stone which was removed on 06/04/13. She spent 3 nights in the hospital postop.  The patient also had 2 admissions for kidney stone in August 2016. [name] She is no longer on Coumadin. Her PCP switched her to Xarelto and I agree with that decision. She has recently been diagnosed with osteoporosis and is being treated for this by Dr. Brigitte Pulse. The patient denies any chest pain or increased dyspnea.  She does complain of osteoarthritis and pain in the left knee.  She has seen Drs. Percell Miller and Patterson Tract concerning this and has received a steroid shot with improvement.  Past Medical History  Diagnosis Date  . Long-term (current) use of  anticoagulants   . Chronic shoulder pain   . Heart palpitations     rare  . Memory problem     "a little short term and long term memory loss" (03/31/2014)  . Depression   . Hyperlipidemia   . Dyslipidemia   . Seasonal allergies   . Anxiety   . Hypertension   . CHF (congestive heart failure) ([place])   . Atrial fibrillation ([place])   . Humerus fracture 03/31/2014    "fell and broke left arm"  . Kidney stones   . Anginal pain ([place])   . Blood in urine     "chronic" (03/31/2014)  . Arthritis     "joints" (03/31/2014)  . Osteopenia     "severe" (03/31/2014)  . Syncope and collapse 03/31/2014  . Alzheimer's dementia 03/31/2014  . Dysrhythmia     atrail fib   . Hx of transfusion of packed red blood cells     Past Surgical History  Procedure Laterality Date  . Ureteral stent placement Bilateral     for stenosis; "they've been removed"  . Removal of growth  2007    growth under kidney removed and possibly kindey stones removed  . Knee arthroscopy Right 2011  . Total shoulder arthroplasty Right 10/2007  . Nephrolithotomy Right 06/04/2013    Procedure: NEPHROLITHOTOMY PERCUTANEOUS;  Surgeon: Franchot Gallo, MD;  Location: [facility];  Service: Urology;  Laterality: Right;  . Appendectomy  ~ 1953  . Bladder suspension  w/hysterectomy  . Cardioversion  2006  . Meniscus repair Left 07/2000  . Cystoscopy with retrograde pyelogram, ureteroscopy and stent placement Bilateral 08/26/2014    Procedure: CYSTOSCOPY WITH RETROGRADE PYELOGRAM, URETEROSCOPY , EXTRACTION OF STONES AND STENT PLACEMENT;  Surgeon: Jorja Loa, MD;  Location: [facility];  Service: Urology;  Laterality: Bilateral;  . Holmium laser application Left 16/11/958    Procedure: HOLMIUM LASER APPLICATION;  Surgeon: Jorja Loa, MD;  Location: [facility];  Service: Urology;  Laterality: Left;  . Cystoscopy/retrograde/ureteroscopy/stone extraction with basket Right 10/08/2014    Procedure: CYSTOSCOPY, retrograde  pyelogram,URETEROSCOPY, laser treatment of calculus, stent exchange;  Surgeon: Jorja Loa, MD;  Location: [facility];  Service: Urology;  Laterality: Right;  . Cystoscopy with stent placement Right 06/24/2015    Procedure: CYSTOSCOPY URETEROSCOPY, PYLEOGRAM, WITH STENT PLACEMENT X2 ;  Surgeon: Franchot Gallo, MD;  Location: [facility];  Service: Urology;  Laterality: Right;  . Cystoscopy with ureteroscopy, stone basketry and stent placement Bilateral 07/21/2015    Procedure: CYSTOSCOPY WITH  right URETEROSCOPY, STONE BASKETRY bilateral pylegram  removal bilateral double j stents;  Surgeon: Franchot Gallo, MD;  Location: [facility];  Service: Urology;  Laterality: Bilateral;  . Holmium laser application Right 02/21/4097    Procedure: HOLMIUM LASER APPLICATION;  Surgeon: Franchot Gallo, MD;  Location: [facility];  Service: Urology;  Laterality: Right;     Current Outpatient Prescriptions  Medication Sig Dispense Refill  . acetaminophen (TYLENOL) 500 MG tablet Take 500 mg by mouth 2 (two) times daily as needed for mild pain.    . cholecalciferol (VITAMIN D) 1000 UNITS tablet Take 1,000 Units by mouth daily.    . diazepam (VALIUM) 5 MG tablet Take 2.5-5 mg by mouth every 12 (twelve) hours as needed for anxiety.    . digoxin (LANOXIN) 0.125 MG tablet Take 0.125 mg by mouth every Monday, Wednesday, and Friday.     . docusate sodium (COLACE) 100 MG capsule Take 1 capsule (100 mg total) by mouth 2 (two) times daily. 30 capsule 0  . donepezil (ARICEPT) 10 MG tablet Take 10 mg by mouth at bedtime.     [name] levofloxacin (LEVAQUIN) 500 MG tablet Take 500 mg by mouth daily. Started on 07/16/2015    . metoprolol tartrate (LOPRESSOR) 25 MG tablet TAKE ONE-HALF (12.5 MG TOTAL) TABLET BY MOUTH IN THE MORNING 45 tablet 0  . mirtazapine (REMERON) 15 MG tablet Take 15 mg by mouth at bedtime as needed (sleep).     . Multiple Vitamin (MULTIVITAMIN WITH MINERALS) TABS Take 1 tablet by mouth daily.    . Omega-3 Fatty Acids  (FISH OIL PO) Take 1 capsule by mouth daily.    . potassium chloride (K-DUR) 10 MEQ tablet Take 10 mEq by mouth daily.  0  . Rivaroxaban (XARELTO) 20 MG TABS tablet Take 20 mg by mouth at bedtime.  30 tablet   . rosuvastatin (CRESTOR) 5 MG tablet Take 2.5 mg by mouth at bedtime.    . traMADol (ULTRAM) 50 MG tablet Take 1 tablet (50 mg total) by mouth every 6 (six) hours as needed for moderate pain. 30 tablet 0  . vitamin C (ASCORBIC ACID) 500 MG tablet Take 500 mg by mouth daily.     No current facility-administered medications for this visit.    Allergies:   Sulfa antibiotics and Zocor    Social History:  The patient  reports that she quit smoking about 34 years ago. Her smoking use included Cigarettes. She has a  1.25 pack-year smoking history. She has never used smokeless tobacco. She reports that she does not drink alcohol or use illicit drugs.   Family History:  The patient's family history includes Heart disease in her mother.    ROS:  Please see the history of present illness.   Otherwise, review of systems are positive for none.   All other systems are reviewed and negative.    PHYSICAL EXAM: VS:  BP 130/68 mmHg  Pulse 73  Ht 5' 5.5" (1.664 m)  Wt 128 lb (58.06 kg)  BMI 20.97 kg/m2 , BMI Body mass index is 20.97 kg/(m^2). GEN: Well nourished, well developed, in no acute distress HEENT: normal Neck: no JVD, carotid bruits, or masses Cardiac: Irregularly irregular; no murmurs, rubs, or gallops,no edema  Respiratory:  clear to auscultation bilaterally, normal work of breathing GI: soft, nontender, nondistended, + BS MS: no deformity or atrophy Skin: warm and dry, no rash Neuro:  Strength and sensation are intact Psych: euthymic mood, full affect   EKG:  EKG is ordered today. The ekg ordered today demonstrates atrial fibrillation with controlled ventricular response and heart rate of 73 bpm.  Left axis deviation.  Right bundle branch block.  Since 07/18/15, no significant  change except heart rate is now slower   Recent Labs: 07/18/2015: BUN 20; Creatinine, Ser 0.85; Hemoglobin 14.1; Platelets 291; Potassium 4.2; Sodium 140    Lipid Panel    Component Value Date/Time   CHOL 166 02/26/2011 0940   TRIG 63.0 02/26/2011 0940   HDL 74.20 02/26/2011 0940   CHOLHDL 2 02/26/2011 0940   VLDL 12.6 02/26/2011 0940   LDLCALC 79 02/26/2011 0940      Wt Readings from Last 3 Encounters:  08/31/15 128 lb (58.06 kg)  07/21/15 135 lb 5.8 oz (61.4 kg)  07/18/15 124 lb (56.246 kg)         ASSESSMENT AND PLAN:  1. permanent atrial fibrillation on rate control and Xarelto. She has not been having any symptoms related to her atrial fibrillation. She has not had any thromboembolic episodes. No bleeding from the Xarelto. 2. Dementia 3. dyslipidemia followed by PCP  Plan: Continue same medication. Recheck in 6 months for office visit   Current medicines are reviewed at length with the patient today.  The patient does not have concerns regarding medicines.  The following changes have been made:  no change  Labs/ tests ordered today include:   Orders Placed This Encounter  Procedures  . EKG 12-Lead     Disposition: Continue current medication.  Recheck in 6 months for follow-up office visit.  Berna Spare MD 08/31/2015 5:33 PM    [address], [place], [place]  [address] Phone: [phone]; Fax: [phone]

## 2015-08-31 NOTE — Patient Instructions (Addendum)
Medication Instructions:  Your physician recommends that you continue on your current medications as directed. Please refer to the Current Medication list given to you today.  Labwork: NONE  Testing/Procedures: NOE  Follow-Up: Your physician recommends that you schedule a follow-up appointment in: [place]

## 2015-10-22 ENCOUNTER — Other Ambulatory Visit: Payer: Self-pay | Admitting: Cardiology

## 2015-11-03 ENCOUNTER — Encounter: Payer: Self-pay | Admitting: Cardiology

## 2016-01-25 ENCOUNTER — Other Ambulatory Visit (HOSPITAL_COMMUNITY): Payer: Self-pay | Admitting: *Deleted

## 2016-01-26 ENCOUNTER — Ambulatory Visit (HOSPITAL_COMMUNITY): Payer: Medicare Other

## 2016-02-29 ENCOUNTER — Ambulatory Visit (INDEPENDENT_AMBULATORY_CARE_PROVIDER_SITE_OTHER): Payer: Medicare Other | Admitting: Physician Assistant

## 2016-02-29 ENCOUNTER — Ambulatory Visit: Payer: Medicare Other | Admitting: Physician Assistant

## 2016-02-29 ENCOUNTER — Encounter: Payer: Self-pay | Admitting: Physician Assistant

## 2016-02-29 VITALS — BP 110/80 | HR 73 | Ht 65.5 in | Wt 131.6 lb

## 2016-02-29 DIAGNOSIS — I482 Chronic atrial fibrillation, unspecified: Secondary | ICD-10-CM

## 2016-02-29 DIAGNOSIS — I10 Essential (primary) hypertension: Secondary | ICD-10-CM | POA: Diagnosis not present

## 2016-02-29 DIAGNOSIS — R600 Localized edema: Secondary | ICD-10-CM

## 2016-02-29 MED ORDER — POTASSIUM CHLORIDE ER 10 MEQ PO TBCR: 10.0000 meq | EXTENDED_RELEASE_TABLET | ORAL | Status: DC | PRN

## 2016-02-29 MED ORDER — FUROSEMIDE 20 MG PO TABS: 20.0000 mg | ORAL_TABLET | ORAL | Status: DC | PRN

## 2016-02-29 NOTE — Progress Notes (Signed)
Cardiology Office Note   Date:  02/29/2016   ID:  Penny BATZEL, DOB June 08, 1929, MRN [id number]  PCP:  Penny Redwood, MD  Cardiologist:  Set up with Dr. Angelena [name] by daughter's request (Previously Dr. Mare Ferrari)   Chief Complaint  Patient presents with  . Follow-up    previously Dr. Mare Ferrari      History of Present Illness: Penny [name] is a 82 y.o. female who presents for follow-up. She has a history of permanent atrial fibrillation, syncope, hyperlipidemia, hypertension and Alzheimer's dementia. She had a negative adenosine myoview 01/2008. She had a syncope and collapse in July 2013, her blood pressure was low, her lisinopril was stopped. He had another episode in February 2014, a 30 day event monitor was placed which did not show any cause for syncope. Last echocardiogram on 01/13/2013 showed EF 55-60%, no regional wall motion abnormality, mild MR, severe TR. Last carotid ultrasound obtained on 01/13/2013 show no significant ICA stenosis noted, mild to moderate plaque noted on the right. She is on digoxin 0.125 Monday Wednesday and Friday only. She is no longer on Coumadin, her PCP has switched her to Xarelto. She was last seen in the office in 08/2015 at which time she complained of osteoarthritis and pain in the left knee. She was followed by orthopedic. Otherwise she has been doing well with permanent atrial fibrillation that is rate controlled and anticoagulated. 6 month follow-up was recommended.  She presents today for follow-up, she does have significant bilateral pitting edema. Otherwise she has been feeling generally well. She has not had a syncope episode for several years. She did recently lose her husband to cancer. Family has been taking care of her. Daughter is a Marine scientist at cardiac ICU at [employer]. She denies any shortness breath or chest pain. Her lung is clear on exam. As for her lower extremity edema, I have instructed her to raise her leg at night. I have given  her a prescription of Lasix 20 mg along with 20 meg of potassium chloride, given her size and blood pressure, she should not take Lasix more than twice a week. I have made it PRN for increased swelling. For tonight I wanted to hold her off on Lasix, I have instructed her family to put 2 pillows under her bed to see if swelling will decreased. If she still has edema, she can start using PRN lasix. She will follow-up with Dr. Angelena [name] in 6 month at daughter's request. EKG obtained today shows despite taking only daily amount of metoprolol tartrate at the lowest dose, her heart rate is very well controlled in atrial fibrillation. Given her borderline blood pressure, I'm less inclined to increase the dose to twice a day at this time.    Past Medical History  Diagnosis Date  . Long-term (current) use of anticoagulants   . Chronic shoulder pain   . Heart palpitations     rare  . Memory problem     "a little short term and long term memory loss" (03/31/2014)  . Depression   . Hyperlipidemia   . Dyslipidemia   . Seasonal allergies   . Anxiety   . Hypertension   . CHF (congestive heart failure) ([place])   . Atrial fibrillation ([place])   . Humerus fracture 03/31/2014    "fell and broke left arm"  . Kidney stones   . Anginal pain ([place])   . Blood in urine     "chronic" (03/31/2014)  . Arthritis     "  joints" (03/31/2014)  . Osteopenia     "severe" (03/31/2014)  . Syncope and collapse 03/31/2014  . Alzheimer's dementia 03/31/2014  . Dysrhythmia     atrail fib   . Hx of transfusion of packed red blood cells     Past Surgical History  Procedure Laterality Date  . Ureteral stent placement Bilateral     for stenosis; "they've been removed"  . Removal of growth  2007    growth under kidney removed and possibly kindey stones removed  . Knee arthroscopy Right 2011  . Total shoulder arthroplasty Right 10/2007  . Nephrolithotomy Right 06/04/2013    Procedure: NEPHROLITHOTOMY PERCUTANEOUS;  Surgeon:  Franchot Gallo, MD;  Location: [facility];  Service: Urology;  Laterality: Right;  . Appendectomy  ~ 1953  . Bladder suspension      w/hysterectomy  . Cardioversion  2006  . Meniscus repair Left 07/2000  . Cystoscopy with retrograde pyelogram, ureteroscopy and stent placement Bilateral 08/26/2014    Procedure: CYSTOSCOPY WITH RETROGRADE PYELOGRAM, URETEROSCOPY , EXTRACTION OF STONES AND STENT PLACEMENT;  Surgeon: Jorja Loa, MD;  Location: [facility];  Service: Urology;  Laterality: Bilateral;  . Holmium laser application Left [id number]    Procedure: HOLMIUM LASER APPLICATION;  Surgeon: Jorja Loa, MD;  Location: [facility];  Service: Urology;  Laterality: Left;  . Cystoscopy/retrograde/ureteroscopy/stone extraction with basket Right 10/08/2014    Procedure: CYSTOSCOPY, retrograde pyelogram,URETEROSCOPY, laser treatment of calculus, stent exchange;  Surgeon: Jorja Loa, MD;  Location: [facility];  Service: Urology;  Laterality: Right;  . Cystoscopy with stent placement Right 06/24/2015    Procedure: CYSTOSCOPY URETEROSCOPY, PYLEOGRAM, WITH STENT PLACEMENT X2 ;  Surgeon: Franchot Gallo, MD;  Location: [facility];  Service: Urology;  Laterality: Right;  . Cystoscopy with ureteroscopy, stone basketry and stent placement Bilateral 07/21/2015    Procedure: CYSTOSCOPY WITH  right URETEROSCOPY, STONE BASKETRY bilateral pylegram  removal bilateral double j stents;  Surgeon: Franchot Gallo, MD;  Location: [facility];  Service: Urology;  Laterality: Bilateral;  . Holmium laser application Right [id number]    Procedure: HOLMIUM LASER APPLICATION;  Surgeon: Franchot Gallo, MD;  Location: [facility];  Service: Urology;  Laterality: Right;     Current Outpatient Prescriptions  Medication Sig Dispense Refill  . docusate sodium (COLACE) 100 MG capsule Take 100 mg by mouth daily as needed for mild constipation or moderate constipation.    [name] acetaminophen (TYLENOL) 500 MG tablet Take 500 mg by mouth 2 (two)  times daily as needed for mild pain.    . cholecalciferol (VITAMIN D) 1000 UNITS tablet Take 1,000 Units by mouth daily.    . diazepam (VALIUM) 5 MG tablet Take 2.5-5 mg by mouth every 12 (twelve) hours as needed for anxiety.    . digoxin (LANOXIN) 0.125 MG tablet Take 0.125 mg by mouth every Monday, Wednesday, and Friday.     . donepezil (ARICEPT) 10 MG tablet Take 10 mg by mouth at bedtime.     . furosemide (LASIX) 20 MG tablet Take 1 tablet (20 mg total) by mouth as needed for fluid or edema. 90 tablet 3  . metoprolol tartrate (LOPRESSOR) 25 MG tablet TAKE ONE-HALF TABLET BY MOUTH IN THE MORNING 45 tablet 11  . mirtazapine (REMERON) 15 MG tablet Take 15 mg by mouth at bedtime as needed (sleep).     . Multiple Vitamin (MULTIVITAMIN WITH MINERALS) TABS Take 1 tablet by mouth daily.    [name] NAMZARIC 28-10 MG CP24 take 1 capsule by mouth  once daily TO REPLACE DONEPAZIL  1  . Omega-3 Fatty Acids (FISH OIL PO) Take 1 capsule by mouth daily.    . potassium chloride (K-DUR) 10 MEQ tablet Take 1 tablet (10 mEq total) by mouth as needed (take only when you have to take a lasix). 90 tablet 3  . Rivaroxaban (XARELTO) 20 MG TABS tablet Take 20 mg by mouth at bedtime.  30 tablet   . rosuvastatin (CRESTOR) 5 MG tablet Take 2.5 mg by mouth at bedtime.    . vitamin C (ASCORBIC ACID) 500 MG tablet Take 500 mg by mouth daily.     No current facility-administered medications for this visit.    Allergies:   Sulfa antibiotics and Zocor    Social History:  The patient  reports that she quit smoking about 34 years ago. Her smoking use included Cigarettes. She has a 1.25 pack-year smoking history. She has never used smokeless tobacco. She reports that she does not drink alcohol or use illicit drugs.   Family History:  The patient's family history includes Heart attack (age of onset: 18) in her mother; Heart disease in her mother.    ROS:  Please see the history of present illness.   Otherwise, review of systems  are positive for LE edema.   All other systems are reviewed and negative.    PHYSICAL EXAM: VS:  BP 110/80 mmHg  Pulse 73  Ht 5' 5.5" (1.664 m)  Wt 131 lb 9.6 oz (59.693 kg)  BMI 21.56 kg/m2  SpO2 96% , BMI Body mass index is 21.56 kg/(m^2). GEN: Well nourished, well developed, in no acute distress HEENT: normal Neck: no carotid bruits, or masses +JVD Cardiac: irregular; no murmurs, rubs, or gallops,no edema  Respiratory:  clear to auscultation bilaterally, normal work of breathing GI: soft, nontender, nondistended, + BS MS: no deformity or atrophy Skin: warm and dry, no rash Neuro:  Strength and sensation are intact Psych: euthymic mood, full affect   EKG:  EKG is ordered today. The ekg ordered today demonstrates afib with HR 60s   Recent Labs: 07/18/2015: BUN 20; Creatinine, Ser 0.85; Hemoglobin 14.1; Platelets 291; Potassium 4.2; Sodium 140    Lipid Panel    Component Value Date/Time   CHOL 166 02/26/2011 0940   TRIG 63.0 02/26/2011 0940   HDL 74.20 02/26/2011 0940   CHOLHDL 2 02/26/2011 0940   VLDL 12.6 02/26/2011 0940   LDLCALC 79 02/26/2011 0940      Wt Readings from Last 3 Encounters:  02/29/16 131 lb 9.6 oz (59.693 kg)  08/31/15 128 lb (58.06 kg)  07/21/15 135 lb 5.8 oz (61.4 kg)      Other studies Reviewed: Additional studies/ records that were reviewed today include:   Echo 01/13/2013 LV EF: 55% -  60%  ------------------------------------------------------------ Indications:   Syncope 780.2.  ------------------------------------------------------------ History:  PMH: former smoker, dizzy, rbbb, collapse Syncope. Atrial fibrillation.  ------------------------------------------------------------ Study Conclusions  - Left ventricle: The cavity size was normal. Wall thickness was normal. Systolic function was normal. The estimated ejection fraction was in the range of 55% to 60%. Wall motion was normal; there were no regional wall  motion abnormalities. - Aortic valve: Trivial regurgitation. - Mitral valve: Mild regurgitation. - Left atrium: The atrium was mildly dilated. - Right ventricle: The cavity size was mildly dilated. - Right atrium: The atrium was severely dilated. - Tricuspid valve: Severe regurgitation. - Pulmonary arteries: Systolic pressure was mildly increased.   Review of the above records demonstrates:  Patient has history of permanent atrial fibrillation came in for six-month follow-up complaining of increasing lower extremity edema.    ASSESSMENT AND PLAN:  1. LE edema  - will try conservative management first with leg raising, if does not improve, she will take PRN 20mg  lasix along with 40meq KCl  - obtain BMET and Echocardiogram. Some of the swelling likely attributed by severe TR  2. permanent atrial fibrillation  - CHA2DS2-Vasc score 4 (age, female, HTN), on Xarelto  - EKG today shows good rate control in atrial fibrillation, on metoprolol tartrate 12.5 mg daily instead of twice a day and digoxin 3 times weekly.  3. H/o syncope: no recurrence in recent years  4. Hyperlipidemia: on crestor  5. Hypertension: well controlled BP 110/80  6. Alzheimer's dementia: mild, able to communicate. She did not talk much, but able to answer most questions when asked. She did lose her husband recently. Continue Aricept.     Current medicines are reviewed at length with the patient today.  The patient does not have concerns regarding medicines.  The following changes have been made:  Add PRN lasix and KCl  Labs/ tests ordered today include:   Orders Placed This Encounter  Procedures  . Basic Metabolic Panel (BMET)  . EKG 12-Lead  . Echocardiogram     Disposition:   FU with Dr. Angelena [name] in 6 months  Signed, Almyra Deforest, [place]  02/29/2016 5:01 PM    [address], [place], [place]  [address] Phone: [phone]; Fax: [phone]

## 2016-02-29 NOTE — Patient Instructions (Addendum)
Medication Instructions:  Your physician has recommended you make the following change in your medication:  1.  START Lasix 20 mg TAKE ONLY AS NEEDED FOR LOWER EXTREMITY SWELLING 2.  START Potassium 10 meq TAKE ONLY WHEN YOU HAVE TO TAKE A LASIX (1 TABLET)  Labwork: TODAY:  BMET  Testing/Procedures: Your physician has requested that you have an echocardiogram. Echocardiography is a painless test that uses sound waves to create images of your heart. It provides your doctor with information about the size and shape of your heart and how well your heart's chambers and valves are working. This procedure takes approximately one hour. There are no restrictions for this procedure.    Follow-Up: Your physician wants you to follow-up in: 6 MONTHS WITH DR. Angelena Form   You will receive a reminder letter in the mail two months in advance. If you don't receive a letter, please call our office to schedule the follow-up appointment.    Any Other Special Instructions Will Be Listed Below (If Applicable).  Echocardiogram An echocardiogram, or echocardiography, uses sound waves (ultrasound) to produce an image of your heart. The echocardiogram is simple, painless, obtained within a short period of time, and offers valuable information to your health care provider. The images from an echocardiogram can provide information such as:  Evidence of coronary artery disease (CAD).  Heart size.  Heart muscle function.  Heart valve function.  Aneurysm detection.  Evidence of a past heart attack.  Fluid buildup around the heart.  Heart muscle thickening.  Assess heart valve function. LET [facility] CARE PROVIDER KNOW ABOUT:  Any allergies you have.  All medicines you are taking, including vitamins, herbs, eye drops, creams, and over-the-counter medicines.  Previous problems you or members of your family have had with the use of anesthetics.  Any blood disorders you have.  Previous surgeries you  have had.  Medical conditions you have.  Possibility of pregnancy, if this applies. BEFORE THE PROCEDURE  No special preparation is needed. Eat and drink normally.  PROCEDURE   In order to produce an image of your heart, gel will be applied to your chest and a wand-like tool (transducer) will be moved over your chest. The gel will help transmit the sound waves from the transducer. The sound waves will harmlessly bounce off your heart to allow the heart images to be captured in real-time motion. These images will then be recorded.  You may need an IV to receive a medicine that improves the quality of the pictures. AFTER THE PROCEDURE You may return to your normal schedule including diet, activities, and medicines, unless your health care provider tells you otherwise.   This information is not intended to replace advice given to you by your health care provider. Make sure you discuss any questions you have with your health care provider.   Document Released: 11/02/2000 Document Revised: 11/26/2014 Document Reviewed: 07/13/2013 Elsevier Interactive Patient Education [employer].    If you need a refill on your cardiac medications before your next appointment, please call your pharmacy.

## 2016-03-01 LAB — BASIC METABOLIC PANEL
BUN: 16 mg/dL (ref 7–25)
CALCIUM: 9.2 mg/dL (ref 8.6–10.4)
CHLORIDE: 102 mmol/L (ref 98–110)
CO2: 28 mmol/L (ref 20–31)
CREATININE: 0.84 mg/dL (ref 0.60–0.88)
Glucose, Bld: 96 mg/dL (ref 65–99)
Potassium: 3 mmol/L — ABNORMAL LOW (ref 3.5–5.3)
Sodium: 146 mmol/L (ref 135–146)

## 2016-03-02 ENCOUNTER — Telehealth: Payer: Self-pay | Admitting: Cardiology

## 2016-03-02 NOTE — Telephone Encounter (Signed)
Follow up    Pt daughter calling for labs  Reviewed chart prep and Rico Junker called left vm 03/02/16

## 2016-03-02 NOTE — Telephone Encounter (Signed)
Pts daughter, [name], [place] ON FILE, has been made aware to have pt take 2 20 meq potassium bid X's 1 day then only as taking lasix. She verbalized understanding.

## 2016-03-21 ENCOUNTER — Ambulatory Visit (HOSPITAL_COMMUNITY): Payer: Medicare Other | Attending: Physician Assistant

## 2016-03-21 ENCOUNTER — Other Ambulatory Visit: Payer: Self-pay

## 2016-03-21 DIAGNOSIS — I071 Rheumatic tricuspid insufficiency: Secondary | ICD-10-CM | POA: Insufficient documentation

## 2016-03-21 DIAGNOSIS — I340 Nonrheumatic mitral (valve) insufficiency: Secondary | ICD-10-CM | POA: Insufficient documentation

## 2016-03-21 DIAGNOSIS — I341 Nonrheumatic mitral (valve) prolapse: Secondary | ICD-10-CM | POA: Diagnosis not present

## 2016-03-21 DIAGNOSIS — I358 Other nonrheumatic aortic valve disorders: Secondary | ICD-10-CM | POA: Insufficient documentation

## 2016-03-21 DIAGNOSIS — I509 Heart failure, unspecified: Secondary | ICD-10-CM | POA: Diagnosis not present

## 2016-03-21 DIAGNOSIS — R600 Localized edema: Secondary | ICD-10-CM | POA: Diagnosis not present

## 2016-03-21 DIAGNOSIS — I110 Hypertensive heart disease with heart failure: Secondary | ICD-10-CM | POA: Diagnosis not present

## 2016-03-28 ENCOUNTER — Telehealth: Payer: Self-pay | Admitting: Cardiovascular Disease

## 2016-03-28 NOTE — Telephone Encounter (Signed)
Pt daughter, Jeral Fruit, [place] on file, has been made aware of pts echo results. She verbalized understanding and appreciation.

## 2016-03-28 NOTE — Telephone Encounter (Signed)
New Message:  Pt' daughter is calling in to get the results to the pt's Echo. Please f/u with her

## 2016-04-30 ENCOUNTER — Ambulatory Visit (HOSPITAL_COMMUNITY): Payer: Medicare Other | Attending: Internal Medicine

## 2016-05-16 ENCOUNTER — Other Ambulatory Visit (HOSPITAL_COMMUNITY): Payer: Self-pay | Admitting: *Deleted

## 2016-05-17 ENCOUNTER — Ambulatory Visit (HOSPITAL_COMMUNITY): Payer: Medicare Other | Attending: Internal Medicine

## 2017-01-08 ENCOUNTER — Encounter (HOSPITAL_COMMUNITY): Payer: Self-pay | Admitting: Emergency Medicine

## 2017-01-08 ENCOUNTER — Emergency Department (HOSPITAL_COMMUNITY): Payer: Medicare Other

## 2017-01-08 ENCOUNTER — Emergency Department (HOSPITAL_COMMUNITY)
Admission: EM | Admit: 2017-01-08 | Discharge: 2017-01-09 | Disposition: A | Discharge status: Home / Self Care | Payer: Medicare Other | Attending: Emergency Medicine | Admitting: Emergency Medicine

## 2017-01-08 DIAGNOSIS — G309 Alzheimer's disease, unspecified: Secondary | ICD-10-CM | POA: Insufficient documentation

## 2017-01-08 DIAGNOSIS — I110 Hypertensive heart disease with heart failure: Secondary | ICD-10-CM | POA: Diagnosis not present

## 2017-01-08 DIAGNOSIS — Z7901 Long term (current) use of anticoagulants: Secondary | ICD-10-CM | POA: Diagnosis not present

## 2017-01-08 DIAGNOSIS — E876 Hypokalemia: Secondary | ICD-10-CM | POA: Insufficient documentation

## 2017-01-08 DIAGNOSIS — Z87891 Personal history of nicotine dependence: Secondary | ICD-10-CM | POA: Insufficient documentation

## 2017-01-08 DIAGNOSIS — Z96611 Presence of right artificial shoulder joint: Secondary | ICD-10-CM | POA: Insufficient documentation

## 2017-01-08 DIAGNOSIS — R531 Weakness: Secondary | ICD-10-CM | POA: Diagnosis present

## 2017-01-08 DIAGNOSIS — I509 Heart failure, unspecified: Secondary | ICD-10-CM | POA: Diagnosis not present

## 2017-01-08 DIAGNOSIS — Z79899 Other long term (current) drug therapy: Secondary | ICD-10-CM | POA: Diagnosis not present

## 2017-01-08 DIAGNOSIS — N390 Urinary tract infection, site not specified: Secondary | ICD-10-CM | POA: Diagnosis not present

## 2017-01-08 DIAGNOSIS — R319 Hematuria, unspecified: Secondary | ICD-10-CM | POA: Insufficient documentation

## 2017-01-08 LAB — CBC
HCT: 38.2 % (ref 36.0–46.0)
HEMOGLOBIN: 12.8 g/dL (ref 12.0–15.0)
MCH: 30 pg (ref 26.0–34.0)
MCHC: 33.5 g/dL (ref 30.0–36.0)
MCV: 89.7 fL (ref 78.0–100.0)
Platelets: 180 10*3/uL (ref 150–400)
RBC: 4.26 MIL/uL (ref 3.87–5.11)
RDW: 13.4 % (ref 11.5–15.5)
WBC: 9.8 10*3/uL (ref 4.0–10.5)

## 2017-01-08 LAB — URINALYSIS, ROUTINE W REFLEX MICROSCOPIC
Bilirubin Urine: NEGATIVE
Glucose, UA: NEGATIVE mg/dL
KETONES UR: 5 mg/dL — AB
Nitrite: NEGATIVE
PROTEIN: 30 mg/dL — AB
Specific Gravity, Urine: 1.014 (ref 1.005–1.030)
pH: 6 (ref 5.0–8.0)

## 2017-01-08 LAB — BASIC METABOLIC PANEL
Anion gap: 9 (ref 5–15)
BUN: 18 mg/dL (ref 6–20)
CHLORIDE: 104 mmol/L (ref 101–111)
CO2: 28 mmol/L (ref 22–32)
CREATININE: 0.72 mg/dL (ref 0.44–1.00)
Calcium: 9.6 mg/dL (ref 8.9–10.3)
GFR calc Af Amer: >60 mL/min (ref >60)
GFR calc non Af Amer: >60 mL/min (ref >60)
Glucose, Bld: 143 mg/dL — ABNORMAL HIGH (ref 65–99)
Potassium: 3.1 mmol/L — ABNORMAL LOW (ref 3.5–5.1)
Sodium: 141 mmol/L (ref 135–145)

## 2017-01-08 LAB — DIGOXIN LEVEL: Digoxin Level: <0.2 ng/mL — ABNORMAL LOW (ref 0.8–2.0)

## 2017-01-08 LAB — VALPROIC ACID LEVEL: Valproic Acid Lvl: 51 ug/mL (ref 50.0–100.0)

## 2017-01-08 LAB — I-STAT TROPONIN, ED: TROPONIN I, POC: 0.03 ng/mL (ref 0.00–0.08)

## 2017-01-08 LAB — MAGNESIUM: MAGNESIUM: 1.6 mg/dL — AB (ref 1.7–2.4)

## 2017-01-08 LAB — CBG MONITORING, ED: Glucose-Capillary: 147 mg/dL — ABNORMAL HIGH (ref 65–99)

## 2017-01-08 MED ORDER — POTASSIUM CHLORIDE CRYS ER 20 MEQ PO TBCR
40.0000 meq | EXTENDED_RELEASE_TABLET | Freq: Once | ORAL | Status: AC
  Administered 2017-01-08: 40 meq via ORAL
  Filled 2017-01-08: qty 2

## 2017-01-08 MED ORDER — MAGNESIUM OXIDE 400 (241.3 MG) MG PO TABS
400.0000 mg | ORAL_TABLET | Freq: Once | ORAL | Status: AC
  Administered 2017-01-09: 400 mg via ORAL
  Filled 2017-01-08: qty 1

## 2017-01-08 MED ORDER — MAGNESIUM SULFATE IN D5W 1-5 GM/100ML-% IV SOLN
1.0000 g | Freq: Once | INTRAVENOUS | Status: DC
  Filled 2017-01-08: qty 100

## 2017-01-08 MED ORDER — DEXTROSE 5 % IV SOLN
1.0000 g | Freq: Once | INTRAVENOUS | Status: AC
  Administered 2017-01-08: 1 g via INTRAVENOUS
  Filled 2017-01-08: qty 10

## 2017-01-08 MED ORDER — SODIUM CHLORIDE 0.9 % IV BOLUS (SEPSIS)
250.0000 mL | Freq: Once | INTRAVENOUS | Status: AC
  Administered 2017-01-08: 250 mL via INTRAVENOUS

## 2017-01-08 MED ORDER — MAGNESIUM SULFATE 50 % IJ SOLN: 1.0000 g | Freq: Once | INTRAMUSCULAR | Status: DC

## 2017-01-08 NOTE — ED Triage Notes (Signed)
Per EMS pt from home for evaluation of malaise that started today. EMS advised that caregiver stated she had not wanted to eat or drink today. Pt family was concerned for worsening dementia. Pt confused to date and time but oriented to person, place, situation.

## 2017-01-08 NOTE — ED Notes (Signed)
Bed: [id number] Expected date:  Expected time:  Means of arrival:  Comments: 83 yo malaise, not eating

## 2017-01-08 NOTE — ED Notes (Signed)
Attempted to use urinal, but unable to give sample.

## 2017-01-09 MED ORDER — POTASSIUM CHLORIDE ER 10 MEQ PO TBCR: 10.0000 meq | EXTENDED_RELEASE_TABLET | Freq: Two times a day (BID) | ORAL | 0 refills | Status: DC

## 2017-01-09 MED ORDER — CEPHALEXIN 500 MG PO CAPS: 500.0000 mg | ORAL_CAPSULE | Freq: Three times a day (TID) | ORAL | 0 refills | Status: DC

## 2017-01-09 NOTE — ED Provider Notes (Signed)
[facility] DEPT Provider Note   CSN: [id number] Arrival date & time: 01/08/17  2032     History   Chief Complaint Chief Complaint  Patient presents with  . Fatigue    HPI Penny [name] is a 83 y.o. female. She presents here with HER 2 daughters. Complaint is weakness.  HPI:  Patient has history of dementia. She lives independently. She has healthy 44 hours a day either one of her 2 daughters, her healthcare provider. She has degenerative function well despite progressive dementia. She still walks. Usually without walker or cane. Daughter states she came by at  5:00. This is her usual routine. States her mother was weak. She was able to stand and walk require assistance. She did not fall. She does not have unilateral weakness.  Patient is incontinent at baseline. Has not noticed fever or unusual odor to urine. No cough.   Past Medical History:  Diagnosis Date  . Alzheimer's dementia 03/31/2014  . Anginal pain ([place])   . Anxiety   . Arthritis    "joints" (03/31/2014)  . Atrial fibrillation ([place])   . Blood in urine    "chronic" (03/31/2014)  . CHF (congestive heart failure) ([place])   . Chronic shoulder pain   . Depression   . Dyslipidemia   . Dysrhythmia    atrail fib   . Heart palpitations    rare  . Humerus fracture 03/31/2014   "fell and broke left arm"  . Hx of transfusion of packed red blood cells   . Hyperlipidemia   . Hypertension   . Kidney stones   . Long-term (current) use of anticoagulants   . Memory problem    "a little short term and long term memory loss" (03/31/2014)  . Osteopenia    "severe" (03/31/2014)  . Seasonal allergies   . Syncope and collapse 03/31/2014    Patient Active Problem List   Diagnosis Date Noted  . Kidney stone 07/21/2015  . Calculus of ureter 06/24/2015  . Kidney stones 10/07/2014  . Renal calculus, bilateral 08/26/2014  . Severe sepsis(995.92) 07/15/2014  . Bacteremia due to Gram-negative bacteria 07/14/2014  . Septic shock  ([place]) 07/12/2014  . Acute respiratory failure ([place]) 07/12/2014  . Hypokalemia 07/12/2014  . Pyelonephritis 07/12/2014  . Left humeral fracture 05/19/2014  . Dementia 04/05/2014  . Acute blood loss anemia 04/05/2014  . Hypertension 04/05/2014  . Depression 04/05/2014  . Humerus fracture 03/31/2014    Class: Acute  . Syncope 03/31/2014  . Bradycardia 01/14/2013  . UTI (urinary tract infection) 01/13/2013    Class: Acute  . Syncope and collapse 05/28/2012  . Weight loss, unintentional 03/04/2012  . Inguinal hernia 09/25/2011  . Dizziness - light-headed 06/27/2011  . Malaise and fatigue 03/23/2011  . Right bundle branch block 03/23/2011  . Hypercholesterolemia 03/23/2011  . Atrial fibrillation ([place]) 02/26/2011  . Long term current use of anticoagulant 02/26/2011    Past Surgical History:  Procedure Laterality Date  . APPENDECTOMY  ~ 1953  . BLADDER SUSPENSION     w/hysterectomy  . CARDIOVERSION  2006  . CYSTOSCOPY WITH RETROGRADE PYELOGRAM, URETEROSCOPY AND STENT PLACEMENT Bilateral 08/26/2014   Procedure: CYSTOSCOPY WITH RETROGRADE PYELOGRAM, URETEROSCOPY , EXTRACTION OF STONES AND STENT PLACEMENT;  Surgeon: Jorja Loa, MD;  Location: [facility];  Service: Urology;  Laterality: Bilateral;  . CYSTOSCOPY WITH STENT PLACEMENT Right 06/24/2015   Procedure: CYSTOSCOPY URETEROSCOPY, PYLEOGRAM, WITH STENT PLACEMENT X2 ;  Surgeon: Franchot Gallo, MD;  Location: [facility];  Service:  Urology;  Laterality: Right;  . CYSTOSCOPY WITH URETEROSCOPY, STONE BASKETRY AND STENT PLACEMENT Bilateral 07/21/2015   Procedure: CYSTOSCOPY WITH  right URETEROSCOPY, STONE BASKETRY bilateral pylegram  removal bilateral double j stents;  Surgeon: Franchot Gallo, MD;  Location: [facility];  Service: Urology;  Laterality: Bilateral;  . CYSTOSCOPY/RETROGRADE/URETEROSCOPY/STONE EXTRACTION WITH BASKET Right 10/08/2014   Procedure: CYSTOSCOPY, retrograde pyelogram,URETEROSCOPY, laser treatment of calculus, stent  exchange;  Surgeon: Jorja Loa, MD;  Location: [facility];  Service: Urology;  Laterality: Right;  . HOLMIUM LASER APPLICATION Left [id number]   Procedure: HOLMIUM LASER APPLICATION;  Surgeon: Jorja Loa, MD;  Location: [facility];  Service: Urology;  Laterality: Left;  . HOLMIUM LASER APPLICATION Right [id number]   Procedure: HOLMIUM LASER APPLICATION;  Surgeon: Franchot Gallo, MD;  Location: [facility];  Service: Urology;  Laterality: Right;  . KNEE ARTHROSCOPY Right 2011  . MENISCUS REPAIR Left 07/2000  . NEPHROLITHOTOMY Right 06/04/2013   Procedure: NEPHROLITHOTOMY PERCUTANEOUS;  Surgeon: Franchot Gallo, MD;  Location: [facility];  Service: Urology;  Laterality: Right;  . removal of growth  2007   growth under kidney removed and possibly kindey stones removed  . TOTAL SHOULDER ARTHROPLASTY Right 10/2007  . URETERAL STENT PLACEMENT Bilateral    for stenosis; "they've been removed"    OB History    No data available       Home Medications    Prior to Admission medications   Medication Sig Start Date End Date Taking? Authorizing Provider  acetaminophen (TYLENOL) 500 MG tablet Take 500 mg by mouth at bedtime.    Yes Historical Provider, MD  cholecalciferol (VITAMIN D) 1000 UNITS tablet Take 1,000 Units by mouth daily.   Yes Historical Provider, MD  CRANBERRY PO Take 1 capsule by mouth daily.   Yes Historical Provider, MD  diazepam (VALIUM) 5 MG tablet Take 2.5-5 mg by mouth every 12 (twelve) hours as needed for anxiety.   Yes Historical Provider, MD  digoxin (LANOXIN) 0.125 MG tablet Take 0.125 mg by mouth every Monday, Wednesday, and Friday.  01/14/13  Yes Marton Redwood, MD  divalproex (DEPAKOTE) 250 MG DR tablet Take 250 mg by mouth 2 (two) times daily. 12/20/16  Yes Historical Provider, MD  metoprolol tartrate (LOPRESSOR) 25 MG tablet TAKE ONE-HALF TABLET BY MOUTH IN THE MORNING 10/24/15  Yes Darlin Coco, MD  mirtazapine (REMERON) 15 MG tablet Take 15 mg by mouth at bedtime as  needed (sleep).    Yes Historical Provider, MD  [facility] 28-10 MG CP24 Take 1 capsule by mouth once daily TO REPLACE DONEPEZIL 02/13/16  Yes Historical Provider, MD  Omega-3 Fatty Acids (FISH OIL PO) Take 1 capsule by mouth daily.   Yes Historical Provider, MD  Rivaroxaban (XARELTO) 20 MG TABS tablet Take 20 mg by mouth at bedtime.  06/24/13  Yes Darlin Coco, MD  rosuvastatin (CRESTOR) 5 MG tablet Take 2.5 mg by mouth at bedtime.   Yes Historical Provider, MD  vitamin C (ASCORBIC ACID) 500 MG tablet Take 500 mg by mouth daily.   Yes Historical Provider, MD  cephALEXin (KEFLEX) 500 MG capsule Take 1 capsule (500 mg total) by mouth 3 (three) times daily. 01/08/17   Tanna Furry, MD  furosemide (LASIX) 20 MG tablet Take 1 tablet (20 mg total) by mouth as needed for fluid or edema. Patient not taking: Reported on 01/08/2017 02/29/16   Almyra Deforest, PA  potassium chloride (K-DUR) 10 MEQ tablet Take 1 tablet (10 mEq total) by mouth 2 (two) times daily.  01/08/17   Tanna Furry, MD    Family History Family History  Problem Relation Age of Onset  . Heart disease Mother   . Heart attack Mother 33    Social History Social History  Substance Use Topics  . Smoking status: Former Smoker    Packs/day: 0.25    Years: 5.00    Types: Cigarettes    Quit date: 07/20/1981  . Smokeless tobacco: Never Used  . Alcohol use No     Allergies   Sulfa antibiotics and Zocor [simvastatin]   Review of Systems Review of Systems  Constitutional: Positive for activity change. Negative for appetite change, chills, diaphoresis, fatigue and fever.  HENT: Negative for mouth sores, sore throat and trouble swallowing.   Eyes: Negative for visual disturbance.  Respiratory: Negative for cough, chest tightness, shortness of breath and wheezing.   Cardiovascular: Negative for chest pain.  Gastrointestinal: Negative for abdominal distention, abdominal pain, diarrhea, nausea and vomiting.  Endocrine: Negative for polydipsia,  polyphagia and polyuria.  Genitourinary: Negative for dysuria, frequency and hematuria.  Musculoskeletal: Negative for gait problem.  Skin: Negative for color change, pallor and rash.  Neurological: Positive for weakness. Negative for dizziness, syncope, light-headedness and headaches.  Hematological: Does not bruise/bleed easily.     Physical Exam Updated Vital Signs BP 128/71 (BP Location: Left Arm)   Pulse 80   Temp 98 F (36.7 C) (Oral)   Resp 16   Ht 5' 5.5" (1.664 m)   Wt 132 lb (59.9 kg)   SpO2 97%   BMI 21.63 kg/m   Physical Exam  Constitutional: No distress.  Thin, Frail, NOT cachectic.  HENT:  Head: Normocephalic.  Eyes: Conjunctivae are normal. Pupils are equal, round, and reactive to light. No scleral icterus.  Neck: Normal range of motion. Neck supple. No thyromegaly present.  Cardiovascular: Normal rate and regular rhythm.  Exam reveals no gallop and no friction rub.   No murmur heard. Pulmonary/Chest: Effort normal and breath sounds normal. No respiratory distress. She has no wheezes. She has no rales.  Abdominal: Soft. Bowel sounds are normal. She exhibits no distension. There is no tenderness. There is no rebound.  Musculoskeletal: Normal range of motion.  Neurological: She is alert.  Interactive. Smiles as I enter the room. When asked to protrude her tongue she "sherberts" her tongue playfully and laughs.  Skin: Skin is warm and dry. No rash noted.     ED Treatments / Results  Labs (all labs ordered are listed, but only abnormal results are displayed) Labs Reviewed  BASIC METABOLIC PANEL - Abnormal; Notable for the following:       Result Value   Potassium 3.1 (*)    Glucose, Bld 143 (*)    All other components within normal limits  URINALYSIS, ROUTINE W REFLEX MICROSCOPIC - Abnormal; Notable for the following:    APPearance HAZY (*)    Hgb urine dipstick MODERATE (*)    Ketones, ur 5 (*)    Protein, ur 30 (*)    Leukocytes, UA LARGE (*)     Bacteria, UA MANY (*)    Squamous Epithelial / LPF 0-5 (*)    All other components within normal limits  DIGOXIN LEVEL - Abnormal; Notable for the following:    Digoxin Level <0.2 (*)    All other components within normal limits  MAGNESIUM - Abnormal; Notable for the following:    Magnesium 1.6 (*)    All other components within normal limits  CBG MONITORING, ED -  Abnormal; Notable for the following:    Glucose-Capillary 147 (*)    All other components within normal limits  CBC  VALPROIC ACID LEVEL  I-STAT TROPOININ, ED    EKG  EKG Interpretation None       Radiology Ct Head Wo Contrast  Result Date: 01/08/2017 CLINICAL DATA:  83 y/o  F; weakness. EXAM: CT HEAD WITHOUT CONTRAST TECHNIQUE: Contiguous axial images were obtained from the base of the skull through the vertex without intravenous contrast. COMPARISON:  03/31/2014 CT of the head. FINDINGS: Brain: No evidence of acute infarction, hemorrhage, hydrocephalus, extra-axial collection or mass lesion/mass effect. Stable moderate chronic microvascular ischemic changes and parenchymal volume loss of the brain. Vascular: Calcific atherosclerosis of cavernous and paraclinoid internal carotid arteries. Skull: Normal. Negative for fracture or focal lesion. Sinuses/Orbits: No acute finding. Other: None. IMPRESSION: 1. No acute intracranial abnormality. 2. Stable moderate chronic microvascular ischemic changes of the brain and parenchymal volume loss. Electronically Signed   By: Kristine Garbe M.D.   On: 01/08/2017 22:20   Dg Chest Port 1 View  Result Date: 01/08/2017 CLINICAL DATA:  Acute onset of generalized weakness. Initial encounter. EXAM: PORTABLE CHEST 1 VIEW COMPARISON:  Chest radiograph performed 06/24/2015 FINDINGS: The lungs are well-aerated. Mildly increased interstitial markings are noted. There is no evidence of pleural effusion or pneumothorax. The cardiomediastinal silhouette is borderline enlarged. No acute osseous  abnormalities are seen. The right shoulder arthroplasty is grossly unremarkable in appearance. IMPRESSION: Mildly increased interstitial markings noted. Borderline cardiomegaly. Electronically Signed   By: Garald Balding M.D.   On: 01/08/2017 22:02    Procedures Procedures (including critical care time)  Medications Ordered in ED Medications  cefTRIAXone (ROCEPHIN) 1 g in dextrose 5 % 50 mL IVPB (0 g Intravenous Stopped 01/09/17 0011)  potassium chloride SA (K-DUR,KLOR-CON) CR tablet 40 mEq (40 mEq Oral Given 01/08/17 2329)  sodium chloride 0.9 % bolus 250 mL (250 mLs Intravenous New Bag/Given 01/08/17 2332)  magnesium oxide (MAG-OX) tablet 400 mg (400 mg Oral Given 01/09/17 0012)     Initial Impression / Assessment and Plan / ED Course  I have reviewed the triage vital signs and the nursing notes.  Pertinent labs & imaging results that were available during my care of the patient were reviewed by me and considered in my medical decision making (see chart for details).     UTI, mild Hypo K+ and Mypo Mag+.  Final Clinical Impressions(s) / ED Diagnoses   Final diagnoses:  Urinary tract infection with hematuria, site unspecified  Hypokalemia    Daughters are concerned. They state "you would be worried too if you could see her walk".  I have walked her from the bedside, walked out of the room, and back in. This was with no assist."  appropriate. Given IV Rocephin. Given potassium and magnesium. Plan will be Keflex prescription at home. Primary care follow-up. 7 day course of potassium.  New Prescriptions New Prescriptions   CEPHALEXIN (KEFLEX) 500 MG CAPSULE    Take 1 capsule (500 mg total) by mouth 3 (three) times daily.   POTASSIUM CHLORIDE (K-DUR) 10 MEQ TABLET    Take 1 tablet (10 mEq total) by mouth 2 (two) times daily.     Tanna Furry, MD 01/09/17 0030

## 2017-01-23 ENCOUNTER — Encounter (HOSPITAL_COMMUNITY): Payer: Self-pay | Admitting: Emergency Medicine

## 2017-01-23 ENCOUNTER — Emergency Department (HOSPITAL_COMMUNITY): Payer: Medicare Other

## 2017-01-23 ENCOUNTER — Inpatient Hospital Stay (HOSPITAL_COMMUNITY)
Admission: EM | Admit: 2017-01-23 | Discharge: 2017-01-28 | DRG: 689 | Disposition: A | Discharge status: Skilled Nursing Facility | Payer: Medicare Other | Attending: Internal Medicine | Admitting: Internal Medicine

## 2017-01-23 ENCOUNTER — Inpatient Hospital Stay (HOSPITAL_COMMUNITY): Payer: Medicare Other

## 2017-01-23 DIAGNOSIS — Z66 Do not resuscitate: Secondary | ICD-10-CM | POA: Diagnosis present

## 2017-01-23 DIAGNOSIS — I10 Essential (primary) hypertension: Secondary | ICD-10-CM | POA: Diagnosis not present

## 2017-01-23 DIAGNOSIS — I5032 Chronic diastolic (congestive) heart failure: Secondary | ICD-10-CM | POA: Diagnosis present

## 2017-01-23 DIAGNOSIS — Z96611 Presence of right artificial shoulder joint: Secondary | ICD-10-CM | POA: Diagnosis present

## 2017-01-23 DIAGNOSIS — E872 Acidosis, unspecified: Secondary | ICD-10-CM | POA: Diagnosis present

## 2017-01-23 DIAGNOSIS — N200 Calculus of kidney: Secondary | ICD-10-CM

## 2017-01-23 DIAGNOSIS — B9620 Unspecified Escherichia coli [E. coli] as the cause of diseases classified elsewhere: Secondary | ICD-10-CM | POA: Diagnosis present

## 2017-01-23 DIAGNOSIS — R55 Syncope and collapse: Secondary | ICD-10-CM | POA: Diagnosis present

## 2017-01-23 DIAGNOSIS — R5381 Other malaise: Secondary | ICD-10-CM | POA: Diagnosis not present

## 2017-01-23 DIAGNOSIS — E860 Dehydration: Secondary | ICD-10-CM | POA: Diagnosis present

## 2017-01-23 DIAGNOSIS — I110 Hypertensive heart disease with heart failure: Secondary | ICD-10-CM | POA: Diagnosis present

## 2017-01-23 DIAGNOSIS — I4891 Unspecified atrial fibrillation: Secondary | ICD-10-CM | POA: Diagnosis present

## 2017-01-23 DIAGNOSIS — K863 Pseudocyst of pancreas: Secondary | ICD-10-CM

## 2017-01-23 DIAGNOSIS — N132 Hydronephrosis with renal and ureteral calculous obstruction: Secondary | ICD-10-CM | POA: Diagnosis present

## 2017-01-23 DIAGNOSIS — Q638 Other specified congenital malformations of kidney: Secondary | ICD-10-CM

## 2017-01-23 DIAGNOSIS — K5641 Fecal impaction: Secondary | ICD-10-CM | POA: Diagnosis present

## 2017-01-23 DIAGNOSIS — N139 Obstructive and reflux uropathy, unspecified: Secondary | ICD-10-CM | POA: Diagnosis not present

## 2017-01-23 DIAGNOSIS — F329 Major depressive disorder, single episode, unspecified: Secondary | ICD-10-CM | POA: Diagnosis present

## 2017-01-23 DIAGNOSIS — E43 Unspecified severe protein-calorie malnutrition: Secondary | ICD-10-CM | POA: Insufficient documentation

## 2017-01-23 DIAGNOSIS — Z87891 Personal history of nicotine dependence: Secondary | ICD-10-CM

## 2017-01-23 DIAGNOSIS — F0390 Unspecified dementia without behavioral disturbance: Secondary | ICD-10-CM

## 2017-01-23 DIAGNOSIS — Z87440 Personal history of urinary (tract) infections: Secondary | ICD-10-CM

## 2017-01-23 DIAGNOSIS — I482 Chronic atrial fibrillation: Secondary | ICD-10-CM | POA: Diagnosis present

## 2017-01-23 DIAGNOSIS — Z8249 Family history of ischemic heart disease and other diseases of the circulatory system: Secondary | ICD-10-CM

## 2017-01-23 DIAGNOSIS — M8580 Other specified disorders of bone density and structure, unspecified site: Secondary | ICD-10-CM | POA: Diagnosis present

## 2017-01-23 DIAGNOSIS — R32 Unspecified urinary incontinence: Secondary | ICD-10-CM | POA: Diagnosis present

## 2017-01-23 DIAGNOSIS — Z79899 Other long term (current) drug therapy: Secondary | ICD-10-CM

## 2017-01-23 DIAGNOSIS — Z90710 Acquired absence of both cervix and uterus: Secondary | ICD-10-CM | POA: Diagnosis not present

## 2017-01-23 DIAGNOSIS — F0280 Dementia in other diseases classified elsewhere without behavioral disturbance: Secondary | ICD-10-CM | POA: Diagnosis present

## 2017-01-23 DIAGNOSIS — C642 Malignant neoplasm of left kidney, except renal pelvis: Secondary | ICD-10-CM | POA: Diagnosis present

## 2017-01-23 DIAGNOSIS — N2889 Other specified disorders of kidney and ureter: Secondary | ICD-10-CM | POA: Diagnosis not present

## 2017-01-23 DIAGNOSIS — Z882 Allergy status to sulfonamides status: Secondary | ICD-10-CM

## 2017-01-23 DIAGNOSIS — N390 Urinary tract infection, site not specified: Principal | ICD-10-CM

## 2017-01-23 DIAGNOSIS — Z6820 Body mass index (BMI) 20.0-20.9, adult: Secondary | ICD-10-CM

## 2017-01-23 DIAGNOSIS — G309 Alzheimer's disease, unspecified: Secondary | ICD-10-CM | POA: Diagnosis present

## 2017-01-23 DIAGNOSIS — F419 Anxiety disorder, unspecified: Secondary | ICD-10-CM | POA: Diagnosis present

## 2017-01-23 DIAGNOSIS — E785 Hyperlipidemia, unspecified: Secondary | ICD-10-CM | POA: Diagnosis present

## 2017-01-23 DIAGNOSIS — Z87442 Personal history of urinary calculi: Secondary | ICD-10-CM

## 2017-01-23 DIAGNOSIS — R319 Hematuria, unspecified: Secondary | ICD-10-CM

## 2017-01-23 DIAGNOSIS — I5030 Unspecified diastolic (congestive) heart failure: Secondary | ICD-10-CM | POA: Diagnosis present

## 2017-01-23 DIAGNOSIS — Z888 Allergy status to other drugs, medicaments and biological substances status: Secondary | ICD-10-CM

## 2017-01-23 DIAGNOSIS — Z7901 Long term (current) use of anticoagulants: Secondary | ICD-10-CM

## 2017-01-23 LAB — URINALYSIS, ROUTINE W REFLEX MICROSCOPIC
Bilirubin Urine: NEGATIVE
GLUCOSE, UA: NEGATIVE mg/dL
Ketones, ur: NEGATIVE mg/dL
Nitrite: NEGATIVE
PH: 5 (ref 5.0–8.0)
Protein, ur: 100 mg/dL — AB
SPECIFIC GRAVITY, URINE: 1.015 (ref 1.005–1.030)

## 2017-01-23 LAB — COMPREHENSIVE METABOLIC PANEL
ALK PHOS: 68 U/L (ref 38–126)
ANION GAP: 8 (ref 5–15)
AST: 25 U/L (ref 15–41)
Albumin: 3 g/dL — ABNORMAL LOW (ref 3.5–5.0)
BILIRUBIN TOTAL: 1.4 mg/dL — AB (ref 0.3–1.2)
BUN: 19 mg/dL (ref 6–20)
CALCIUM: 9.6 mg/dL (ref 8.9–10.3)
CO2: 30 mmol/L (ref 22–32)
CREATININE: 0.93 mg/dL (ref 0.44–1.00)
Chloride: 106 mmol/L (ref 101–111)
GFR, EST NON AFRICAN AMERICAN: 54 mL/min — AB (ref >60)
Glucose, Bld: 202 mg/dL — ABNORMAL HIGH (ref 65–99)
Potassium: 4.1 mmol/L (ref 3.5–5.1)
Sodium: 144 mmol/L (ref 135–145)
TOTAL PROTEIN: 6.4 g/dL — AB (ref 6.5–8.1)

## 2017-01-23 LAB — BASIC METABOLIC PANEL
BUN: 19 mg/dL (ref 4–21)
Creatinine: 0.9 mg/dL (ref 0.5–1.1)
GLUCOSE: 202 mg/dL
Potassium: 4.1 mmol/L (ref 3.4–5.3)
Sodium: 144 mmol/L (ref 137–147)

## 2017-01-23 LAB — MAGNESIUM
Magnesium: 1.8 mg/dL (ref 1.7–2.4)
Magnesium: 1.7 mg/dL (ref 1.7–2.4)

## 2017-01-23 LAB — I-STAT CG4 LACTIC ACID, ED: LACTIC ACID, VENOUS: 2.84 mmol/L — AB (ref 0.5–1.9)

## 2017-01-23 LAB — CBC WITH DIFFERENTIAL/PLATELET
BASOS ABS: 0 10*3/uL (ref 0.0–0.1)
Basophils Relative: 0 %
EOS PCT: 1 %
Eosinophils Absolute: 0.1 10*3/uL (ref 0.0–0.7)
HCT: 39.5 % (ref 36.0–46.0)
HEMOGLOBIN: 12.5 g/dL (ref 12.0–15.0)
Lymphocytes Relative: 6 %
Lymphs Abs: 0.5 10*3/uL — ABNORMAL LOW (ref 0.7–4.0)
MCH: 30.2 pg (ref 26.0–34.0)
MCHC: 31.6 g/dL (ref 30.0–36.0)
MCV: 95.4 fL (ref 78.0–100.0)
MONO ABS: 0.7 10*3/uL (ref 0.1–1.0)
Monocytes Relative: 8 %
NEUTROS ABS: 7.7 10*3/uL (ref 1.7–7.7)
Neutrophils Relative %: 85 %
Platelets: 202 10*3/uL (ref 150–400)
RBC: 4.14 MIL/uL (ref 3.87–5.11)
RDW: 13.8 % (ref 11.5–15.5)
WBC: 9 10*3/uL (ref 4.0–10.5)

## 2017-01-23 LAB — PHOSPHORUS: PHOSPHORUS: 2.7 mg/dL (ref 2.5–4.6)

## 2017-01-23 LAB — I-STAT TROPONIN, ED: TROPONIN I, POC: 0.01 ng/mL (ref 0.00–0.08)

## 2017-01-23 LAB — CBC AND DIFFERENTIAL
HEMATOCRIT: 40 % (ref 36–46)
HEMOGLOBIN: 12.5 g/dL (ref 12.0–16.0)
PLATELETS: 202 10*3/uL (ref 150–399)
WBC: 9 10*3/mL

## 2017-01-23 LAB — TSH: TSH: 5.133 u[IU]/mL — ABNORMAL HIGH (ref 0.350–4.500)

## 2017-01-23 LAB — DIGOXIN LEVEL: DIGOXIN LVL: 0.3 ng/mL — AB (ref 0.8–2.0)

## 2017-01-23 LAB — HEPATIC FUNCTION PANEL: Bilirubin, Total: 1.4 mg/dL

## 2017-01-23 MED ORDER — RIVAROXABAN 20 MG PO TABS
20.0000 mg | ORAL_TABLET | Freq: Every day | ORAL | Status: DC
  Administered 2017-01-23: 20 mg via ORAL
  Filled 2017-01-23: qty 1

## 2017-01-23 MED ORDER — CEFTRIAXONE SODIUM 1 G IJ SOLR
1.0000 g | Freq: Once | INTRAMUSCULAR | Status: AC
  Administered 2017-01-23: 1 g via INTRAVENOUS
  Filled 2017-01-23: qty 10

## 2017-01-23 MED ORDER — ONDANSETRON HCL 4 MG PO TABS
4.0000 mg | ORAL_TABLET | Freq: Four times a day (QID) | ORAL | Status: DC | PRN
Start: 2017-01-23 — End: 2017-01-28

## 2017-01-23 MED ORDER — DIAZEPAM 5 MG PO TABS
2.5000 mg | ORAL_TABLET | Freq: Two times a day (BID) | ORAL | Status: DC | PRN
  Administered 2017-01-25 – 2017-01-26 (×2): 2.5 mg via ORAL
  Filled 2017-01-23 (×2): qty 1

## 2017-01-23 MED ORDER — VITAMIN C 500 MG PO TABS
500.0000 mg | ORAL_TABLET | Freq: Every day | ORAL | Status: DC
  Administered 2017-01-24 – 2017-01-28 (×4): 500 mg via ORAL
  Filled 2017-01-23 (×5): qty 1

## 2017-01-23 MED ORDER — SODIUM CHLORIDE 0.9% FLUSH
3.0000 mL | Freq: Two times a day (BID) | INTRAVENOUS | Status: DC
  Administered 2017-01-24 – 2017-01-28 (×9): 3 mL via INTRAVENOUS

## 2017-01-23 MED ORDER — VITAMIN D3 25 MCG (1000 UNIT) PO TABS
1000.0000 [IU] | ORAL_TABLET | Freq: Every day | ORAL | Status: DC
  Administered 2017-01-24 – 2017-01-28 (×4): 1000 [IU] via ORAL
  Filled 2017-01-23 (×5): qty 1

## 2017-01-23 MED ORDER — MIRTAZAPINE 15 MG PO TABS
15.0000 mg | ORAL_TABLET | Freq: Every day | ORAL | Status: DC
  Administered 2017-01-23 – 2017-01-27 (×5): 15 mg via ORAL
  Filled 2017-01-23 (×5): qty 1

## 2017-01-23 MED ORDER — DONEPEZIL HCL 5 MG PO TABS
10.0000 mg | ORAL_TABLET | Freq: Every day | ORAL | Status: DC
  Administered 2017-01-23 – 2017-01-27 (×5): 10 mg via ORAL
  Filled 2017-01-23 (×5): qty 2

## 2017-01-23 MED ORDER — SODIUM CHLORIDE 0.9 % IV SOLN
INTRAVENOUS | Status: AC
  Administered 2017-01-23: 20:00:00 via INTRAVENOUS

## 2017-01-23 MED ORDER — ONDANSETRON HCL 4 MG/2ML IJ SOLN: 4.0000 mg | Freq: Four times a day (QID) | INTRAMUSCULAR | Status: DC | PRN

## 2017-01-23 MED ORDER — OMEGA-3-ACID ETHYL ESTERS 1 G PO CAPS
1.0000 g | ORAL_CAPSULE | Freq: Every day | ORAL | Status: DC
  Administered 2017-01-24 – 2017-01-28 (×4): 1 g via ORAL
  Filled 2017-01-23 (×5): qty 1

## 2017-01-23 MED ORDER — MEMANTINE HCL-DONEPEZIL HCL ER 28-10 MG PO CP24: 1.0000 | ORAL_CAPSULE | Freq: Every day | ORAL | Status: DC

## 2017-01-23 MED ORDER — SENNOSIDES-DOCUSATE SODIUM 8.6-50 MG PO TABS
1.0000 | ORAL_TABLET | Freq: Two times a day (BID) | ORAL | Status: DC
  Administered 2017-01-23 – 2017-01-28 (×7): 1 via ORAL
  Filled 2017-01-23 (×10): qty 1

## 2017-01-23 MED ORDER — METOPROLOL TARTRATE 25 MG PO TABS
12.5000 mg | ORAL_TABLET | Freq: Two times a day (BID) | ORAL | Status: DC
  Administered 2017-01-23 – 2017-01-24 (×2): 12.5 mg via ORAL
  Filled 2017-01-23 (×2): qty 1

## 2017-01-23 MED ORDER — MEMANTINE HCL ER 28 MG PO CP24
28.0000 mg | ORAL_CAPSULE | Freq: Every day | ORAL | Status: DC
  Administered 2017-01-23 – 2017-01-27 (×5): 28 mg via ORAL
  Filled 2017-01-23 (×5): qty 1

## 2017-01-23 MED ORDER — DIGOXIN 125 MCG PO TABS
0.1250 mg | ORAL_TABLET | ORAL | Status: DC
  Administered 2017-01-25 – 2017-01-28 (×2): 0.125 mg via ORAL
  Filled 2017-01-23 (×2): qty 1

## 2017-01-23 MED ORDER — ACETAMINOPHEN 325 MG PO TABS: 650.0000 mg | ORAL_TABLET | Freq: Four times a day (QID) | ORAL | Status: DC | PRN

## 2017-01-23 MED ORDER — DEXTROSE 5 % IV SOLN
1.0000 g | INTRAVENOUS | Status: DC
  Administered 2017-01-24 – 2017-01-25 (×2): 1 g via INTRAVENOUS
  Filled 2017-01-23 (×3): qty 10

## 2017-01-23 MED ORDER — SODIUM CHLORIDE 0.9 % IV BOLUS (SEPSIS)
1000.0000 mL | Freq: Once | INTRAVENOUS | Status: AC
  Administered 2017-01-23: 1000 mL via INTRAVENOUS

## 2017-01-23 MED ORDER — POLYETHYLENE GLYCOL 3350 17 G PO PACK
17.0000 g | PACK | Freq: Every day | ORAL | Status: DC
  Administered 2017-01-24 – 2017-01-28 (×3): 17 g via ORAL
  Filled 2017-01-23 (×5): qty 1

## 2017-01-23 MED ORDER — ROSUVASTATIN CALCIUM 5 MG PO TABS
2.5000 mg | ORAL_TABLET | Freq: Every day | ORAL | Status: DC
  Administered 2017-01-23 – 2017-01-27 (×5): 2.5 mg via ORAL
  Filled 2017-01-23 (×5): qty 1

## 2017-01-23 MED ORDER — DIVALPROEX SODIUM 250 MG PO DR TAB
250.0000 mg | DELAYED_RELEASE_TABLET | Freq: Two times a day (BID) | ORAL | Status: DC
  Administered 2017-01-23 – 2017-01-28 (×9): 250 mg via ORAL
  Filled 2017-01-23 (×10): qty 1

## 2017-01-23 MED ORDER — SODIUM CHLORIDE 0.9 % IV BOLUS (SEPSIS): 1000.0000 mL | Freq: Once | INTRAVENOUS | Status: DC

## 2017-01-23 NOTE — ED Triage Notes (Signed)
Per [facility] ptaient comes from home where she was sitting outside in sun with daughter when patient had two syncopal episodes that last approx 5-10 secs each. Per EMS patient initially had pin point pupils and has no narcotic use.  Patient has afib at controlled rate.

## 2017-01-23 NOTE — ED Notes (Signed)
Patient returned from CT

## 2017-01-23 NOTE — H&P (Signed)
History and Physical    Penny [name] XHB:[id number] DOB: February 19, 1929 DOA: 01/23/2017  PCP: Marton Redwood, MD   I have briefly reviewed patients previous medical reports in [facility].  Patient coming from: home   Chief Complaint: syncope, nausea  HPI: Penny [name] is a 83 y/o female with PMH significant for dementia, recurrent UTI's, A. Fib (on xarelto), HTN, HLD and depression; who presented to ED after experiencing 2 short episodes of syncope on the day of admission. Per daughter at bedside patient had 2 episodes of losing consciousness while sitting at her porch. Patient's daughter endorses her mother mentioned been lightheaded and nauseated. Is important to mentioned that just 2 weeks ago she was diagnosed with UTI, and has not complete return to her baseline, regarding eating, drinking or been herself physically. Patient is incontinent at baseline and has been experiencing some difficulty with constipation lately. Per daughter she had hx of hemorrhoids and has seen some BRBPR intermittently; also noticed her urine to be darker than usual. Patient denies dysuria, CP, fever, vomiting, HA's, no focal deficit, or any other complaints reported.  ED Course: normal BMET, EKG w/o abnormalities; elevated lactic acid (2.84) and dirty urine. Given hx of A. Fib, CHF, and concerns of infection in presence of acute syncope event, [facility] was called to admit patient for further evaluation and treatment. Patient received IVF's and one dose of rocephin.   Review of Systems:  All other systems reviewed and except for LLQ pain, no other complaint reported. Level 5 caveat apply as patient has dementia.  Past Medical History:  Diagnosis Date  . Alzheimer's dementia 03/31/2014  . Anginal pain ([place])   . Anxiety   . Arthritis    "joints" (03/31/2014)  . Atrial fibrillation ([place])   . Blood in urine    "chronic" (03/31/2014)  . CHF (congestive heart failure) ([place])   . Chronic shoulder pain   . Depression    . Dyslipidemia   . Dysrhythmia    atrail fib   . Heart palpitations    rare  . Humerus fracture 03/31/2014   "fell and broke left arm"  . Hx of transfusion of packed red blood cells   . Hyperlipidemia   . Hypertension   . Kidney stones   . Long-term (current) use of anticoagulants   . Memory problem    "a little short term and long term memory loss" (03/31/2014)  . Osteopenia    "severe" (03/31/2014)  . Seasonal allergies   . Syncope and collapse 03/31/2014    Past Surgical History:  Procedure Laterality Date  . APPENDECTOMY  ~ 1953  . BLADDER SUSPENSION     w/hysterectomy  . CARDIOVERSION  2006  . CYSTOSCOPY WITH RETROGRADE PYELOGRAM, URETEROSCOPY AND STENT PLACEMENT Bilateral 08/26/2014   Procedure: CYSTOSCOPY WITH RETROGRADE PYELOGRAM, URETEROSCOPY , EXTRACTION OF STONES AND STENT PLACEMENT;  Surgeon: Jorja Loa, MD;  Location: [facility];  Service: Urology;  Laterality: Bilateral;  . CYSTOSCOPY WITH STENT PLACEMENT Right 06/24/2015   Procedure: CYSTOSCOPY URETEROSCOPY, PYLEOGRAM, WITH STENT PLACEMENT X2 ;  Surgeon: Franchot Gallo, MD;  Location: [facility];  Service: Urology;  Laterality: Right;  . CYSTOSCOPY WITH URETEROSCOPY, STONE BASKETRY AND STENT PLACEMENT Bilateral 07/21/2015   Procedure: CYSTOSCOPY WITH  right URETEROSCOPY, STONE BASKETRY bilateral pylegram  removal bilateral double j stents;  Surgeon: Franchot Gallo, MD;  Location: [facility];  Service: Urology;  Laterality: Bilateral;  . CYSTOSCOPY/RETROGRADE/URETEROSCOPY/STONE EXTRACTION WITH BASKET Right 10/08/2014   Procedure: CYSTOSCOPY, retrograde pyelogram,URETEROSCOPY,  laser treatment of calculus, stent exchange;  Surgeon: Jorja Loa, MD;  Location: [facility];  Service: Urology;  Laterality: Right;  . HOLMIUM LASER APPLICATION Left 10/22/5808   Procedure: HOLMIUM LASER APPLICATION;  Surgeon: Jorja Loa, MD;  Location: [facility];  Service: Urology;  Laterality: Left;  . HOLMIUM LASER APPLICATION Right  07/27/3381   Procedure: HOLMIUM LASER APPLICATION;  Surgeon: Franchot Gallo, MD;  Location: [facility];  Service: Urology;  Laterality: Right;  . KNEE ARTHROSCOPY Right 2011  . MENISCUS REPAIR Left 07/2000  . NEPHROLITHOTOMY Right 06/04/2013   Procedure: NEPHROLITHOTOMY PERCUTANEOUS;  Surgeon: Franchot Gallo, MD;  Location: [facility];  Service: Urology;  Laterality: Right;  . removal of growth  2007   growth under kidney removed and possibly kindey stones removed  . TOTAL SHOULDER ARTHROPLASTY Right 10/2007  . URETERAL STENT PLACEMENT Bilateral    for stenosis; "they've been removed"     reports that she quit smoking about 35 years ago. Her smoking use included Cigarettes. She has a 1.25 pack-year smoking history. She has never used smokeless tobacco. She reports that she does not drink alcohol or use drugs.  Allergies  Allergen Reactions  . Sulfa Antibiotics Rash  . Zocor [Simvastatin] Other (See Comments)    Old allergy. Unsure reaction    Family History  Problem Relation Age of Onset  . Heart disease Mother   . Heart attack Mother 5     Prior to Admission medications   Medication Sig Start Date End Date Taking? Authorizing Provider  acetaminophen (TYLENOL) 500 MG tablet Take 500 mg by mouth at bedtime.    Yes Historical Provider, MD  cholecalciferol (VITAMIN D) 1000 UNITS tablet Take 1,000 Units by mouth every morning.    Yes Historical Provider, MD  CRANBERRY PO Take 1 capsule by mouth every morning.    Yes Historical Provider, MD  diazepam (VALIUM) 5 MG tablet Take 2.5-5 mg by mouth every 12 (twelve) hours as needed for anxiety.   Yes Historical Provider, MD  digoxin (LANOXIN) 0.125 MG tablet Take 0.125 mg by mouth every Monday, Wednesday, and Friday.  01/14/13  Yes Marton Redwood, MD  divalproex (DEPAKOTE) 250 MG DR tablet Take 250 mg by mouth 2 (two) times daily. 12/20/16  Yes Historical Provider, MD  metoprolol tartrate (LOPRESSOR) 25 MG tablet TAKE ONE-HALF TABLET BY MOUTH IN THE  MORNING Patient taking differently: Take 12.5 mg by mouth every morning 10/24/15  Yes Darlin Coco, MD  mirtazapine (REMERON) 15 MG tablet Take 15 mg by mouth at bedtime.    Yes Historical Provider, MD  NAMZARIC 28-10 MG CP24 Take 1 capsule by mouth each night  TO REPLACE DONEPEZIL 02/13/16  Yes Historical Provider, MD  Omega-3 Fatty Acids (FISH OIL PO) Take 1 capsule by mouth every morning.    Yes Historical Provider, MD  Rivaroxaban (XARELTO) 20 MG TABS tablet Take 20 mg by mouth at bedtime.  06/24/13  Yes Darlin Coco, MD  rosuvastatin (CRESTOR) 5 MG tablet Take 2.5 mg by mouth at bedtime.   Yes Historical Provider, MD  vitamin C (ASCORBIC ACID) 500 MG tablet Take 500 mg by mouth every morning.    Yes Historical Provider, MD  cephALEXin (KEFLEX) 500 MG capsule Take 1 capsule (500 mg total) by mouth 3 (three) times daily. Patient not taking: Reported on 01/23/2017 01/08/17   Tanna Furry, MD  furosemide (LASIX) 20 MG tablet Take 1 tablet (20 mg total) by mouth as needed for fluid or edema. Patient  not taking: Reported on 01/08/2017 02/29/16   Almyra Deforest, PA  potassium chloride (K-DUR) 10 MEQ tablet Take 1 tablet (10 mEq total) by mouth 2 (two) times daily. Patient not taking: Reported on 01/23/2017 01/08/17   Tanna Furry, MD    Physical Exam: Vitals:   01/23/17 1530 01/23/17 1545 01/23/17 1608 01/23/17 1733  BP: 136/68 134/76  137/68  Pulse: 70 86  81  Resp: 22 21  20   Temp:   99.5 F (37.5 C)   TempSrc:   Rectal   SpO2: 98% 100%  98%    Constitutional: afebrile, oriented X1, in no distress. Patient able to follow simple commands. No nausea, no vomiting.  Eyes: PERTLA, lids and conjunctivae normal, no icterus, no nystagmus  ENMT: Mucous membranes dry on exam. Posterior pharynx clear of any exudate or lesions. Normal dentition.  Neck: supple, no masses, no thyromegaly Respiratory: clear to auscultation bilaterally, no wheezing, no crackles. Normal respiratory effort. No accessory muscle use.    Cardiovascular: S1 & S2 heard, irregular rhythm, soft murmurs appreciated; no rubs and no gallops. Positive trace extremity edema. No JVD.  Abdomen: No distension, mild LLQ tenderness on palpation; no masses palpated. No hepatosplenomegaly. Bowel sounds normal.  Musculoskeletal: no clubbing / cyanosis. No joint deformity upper and lower extremities. Good ROM, no contractures. Normal muscle tone.  Skin: no rashes, lesions or open ulcers appreciated on exam.  Neurologic: CN 2-12 grossly intact. Sensation intact, DTR normal. Strength 4/5 in all 4 limbs.  Psychiatric: patient with poor insight. Alert and oriented x1. Normal mood, no agitation, calm and able to follow simple commands.    Labs on Admission: I have personally reviewed following labs and imaging studies  CBC:  Recent Labs Lab 01/23/17 1519  WBC 9.0  NEUTROABS 7.7  HGB 12.5  HCT 39.5  MCV 95.4  PLT 585   Basic Metabolic Panel:  Recent Labs Lab 01/23/17 1519  NA 144  K 4.1  CL 106  CO2 30  GLUCOSE 202*  BUN 19  CREATININE 0.93  CALCIUM 9.6  MG 1.8   Liver Function Tests:  Recent Labs Lab 01/23/17 1519  AST 25  ALT <5*  ALKPHOS 68  BILITOT 1.4*  PROT 6.4*  ALBUMIN 3.0*   Urine analysis:    Component Value Date/Time   COLORURINE YELLOW 01/23/2017 1447   APPEARANCEUR CLOUDY (A) 01/23/2017 1447   LABSPEC 1.015 01/23/2017 1447   PHURINE 5.0 01/23/2017 1447   GLUCOSEU NEGATIVE 01/23/2017 1447   HGBUR SMALL (A) 01/23/2017 1447   BILIRUBINUR NEGATIVE 01/23/2017 1447   KETONESUR NEGATIVE 01/23/2017 1447   PROTEINUR 100 (A) 01/23/2017 1447   UROBILINOGEN 1.0 07/12/2014 1020   NITRITE NEGATIVE 01/23/2017 1447   LEUKOCYTESUR LARGE (A) 01/23/2017 1447     Radiological Exams on Admission: Dg Chest 2 View  Result Date: 01/23/2017 CLINICAL DATA:  Syncope x2 today. EXAM: CHEST  2 VIEW COMPARISON:  Single-view of the chest 01/08/2017. PA and lateral chest 06/24/2015. FINDINGS: The lungs are clear. There is  cardiomegaly. No pneumothorax or pleural effusion. Aortic atherosclerosis is seen. No acute bony abnormality. The patient is status post right shoulder replacement. IMPRESSION: Cardiomegaly without acute disease. Electronically Signed   By: Inge Rise M.D.   On: 01/23/2017 17:06    EKG:  Patient with rate controlled A. Fib and right bundle branch block. Unchanged from previous tracing.  Assessment/Plan 1-syncope: most likely associated with dehydration from UTI infection/poor PO intake; also with high risk for arrhythmias Vs vasovagal  events, as patient appears to have fecal impaction. -will admit to telemetry  -start treatment for UTI -provide fluid resuscitation (been cautious as patient had hx of CHF) -follow cultures results -assess orthostatic changes -monitor on telemetry (no CP and neg troponin) -assess TSH, B12, Mg and Phosphorus levels -check abd CT scan (to r/o nephrolithiasis as per family request and also to assess further presumed fecal impaction/constipation) -provide supportive care and follow clinical response   2-chronic Atrial fibrillation ([facility]) -rate controlled -will continue low dose metoprolol and digoxin -check digoxin level -continue xarelto -admitted to telemetry   3-UTI (urinary tract infection): with big difficulties quantifying symptoms given dementia and baseline incontinence  -will start empiric treatment with rocephin -follow urine cultures -provide gentle IVF's -provide supportive care and follow clinical response   4-lactic acidosis -most likely associated with dehydration  -will provide IVF's and follow level -patient w/o fever, no elevation of WBC's and no tachycardia  5-Hypertension: -continue metoprolol -BP is stable and well controlled  6-Diastolic heart failure ([place]): chronic and compensated  -will follow daily weights -heart healthy diet -strict intake and output   7-HLD -will continue statins and fish oil  8-depression and  anxiety  -continue depakote and PRN valium   Time: 70 minutes    DVT prophylaxis: patient on Xarelto Code Status: DNR/DNI Family Communication: daughters at bedside  Disposition Plan: anticipate discharge back home once medically stable Consults called: none yet; but depending on telemetry findings could required cardiology evaluation)  Admission status: inpatient, telemetry bed, LOS > 2 midnights     Barton Dubois MD [facility] Pager [phone]  If 7PM-7AM, please contact night-coverage [email or web address] Password [facility]  01/23/2017, 6:25 PM

## 2017-01-23 NOTE — ED Provider Notes (Signed)
[facility] DEPT Provider Note   CSN: [id number] Arrival date & time: 01/23/17  1422     History   Chief Complaint Chief Complaint  Patient presents with  . syncopal    HPI CRISOL MUECKE is a 83 y.o. female.  HPI   Presents with concern for syncope. Was sitting outside with (per pt) her son and began to feel lightheaded. Per hx from EMS was sitting in sun with daughter when she had 2 syncopal episodes that lasted 5-10 seconds each. Patient denies other symptoms other than lightheadedness and now reports she is at baseline.  No fall.  No chest pain, no shortness of breath.  Reports eating and drinking ok, just ate a little bit this AM "i don't eat a big breakfast". No nausea, vomiting, diarrhea, black or bloody stools.   Past Medical History:  Diagnosis Date  . Alzheimer's dementia 03/31/2014  . Anginal pain ([place])   . Anxiety   . Arthritis    "joints" (03/31/2014)  . Atrial fibrillation ([place])   . Blood in urine    "chronic" (03/31/2014)  . CHF (congestive heart failure) ([place])   . Chronic shoulder pain   . Depression   . Dyslipidemia   . Dysrhythmia    atrail fib   . Heart palpitations    rare  . Humerus fracture 03/31/2014   "fell and broke left arm"  . Hx of transfusion of packed red blood cells   . Hyperlipidemia   . Hypertension   . Kidney stones   . Long-term (current) use of anticoagulants   . Memory problem    "a little short term and long term memory loss" (03/31/2014)  . Osteopenia    "severe" (03/31/2014)  . Seasonal allergies   . Syncope and collapse 03/31/2014    Patient Active Problem List   Diagnosis Date Noted  . Lactic acidosis 01/23/2017  . Diastolic heart failure ([place]) 01/23/2017  . Kidney stone 07/21/2015  . Calculus of ureter 06/24/2015  . Kidney stones 10/07/2014  . Renal calculus, bilateral 08/26/2014  . Severe sepsis(995.92) 07/15/2014  . Bacteremia due to Gram-negative bacteria 07/14/2014  . Septic shock ([place]) 07/12/2014  . Acute  respiratory failure ([place]) 07/12/2014  . Hypokalemia 07/12/2014  . Pyelonephritis 07/12/2014  . Left humeral fracture 05/19/2014  . Dementia without behavioral disturbance 04/05/2014  . Acute blood loss anemia 04/05/2014  . Hypertension 04/05/2014  . Depression 04/05/2014  . Humerus fracture 03/31/2014    Class: Acute  . Syncope 03/31/2014  . Bradycardia 01/14/2013  . UTI (urinary tract infection) 01/13/2013    Class: Acute  . Syncope and collapse 05/28/2012  . Weight loss, unintentional 03/04/2012  . Inguinal hernia 09/25/2011  . Dizziness - light-headed 06/27/2011  . Malaise and fatigue 03/23/2011  . Right bundle branch block 03/23/2011  . Hypercholesterolemia 03/23/2011  . Atrial fibrillation ([place]) 02/26/2011  . Long term current use of anticoagulant 02/26/2011    Past Surgical History:  Procedure Laterality Date  . APPENDECTOMY  ~ 1953  . BLADDER SUSPENSION     w/hysterectomy  . CARDIOVERSION  2006  . CYSTOSCOPY WITH RETROGRADE PYELOGRAM, URETEROSCOPY AND STENT PLACEMENT Bilateral 08/26/2014   Procedure: CYSTOSCOPY WITH RETROGRADE PYELOGRAM, URETEROSCOPY , EXTRACTION OF STONES AND STENT PLACEMENT;  Surgeon: Jorja Loa, MD;  Location: [facility];  Service: Urology;  Laterality: Bilateral;  . CYSTOSCOPY WITH STENT PLACEMENT Right 06/24/2015   Procedure: CYSTOSCOPY URETEROSCOPY, PYLEOGRAM, WITH STENT PLACEMENT X2 ;  Surgeon: Franchot Gallo, MD;  Location: [facility];  Service: Urology;  Laterality: Right;  . CYSTOSCOPY WITH URETEROSCOPY, STONE BASKETRY AND STENT PLACEMENT Bilateral 07/21/2015   Procedure: CYSTOSCOPY WITH  right URETEROSCOPY, STONE BASKETRY bilateral pylegram  removal bilateral double j stents;  Surgeon: Franchot Gallo, MD;  Location: [facility];  Service: Urology;  Laterality: Bilateral;  . CYSTOSCOPY/RETROGRADE/URETEROSCOPY/STONE EXTRACTION WITH BASKET Right 10/08/2014   Procedure: CYSTOSCOPY, retrograde pyelogram,URETEROSCOPY, laser treatment of calculus, stent  exchange;  Surgeon: Jorja Loa, MD;  Location: [facility];  Service: Urology;  Laterality: Right;  . HOLMIUM LASER APPLICATION Left 19/02/1739   Procedure: HOLMIUM LASER APPLICATION;  Surgeon: Jorja Loa, MD;  Location: [facility];  Service: Urology;  Laterality: Left;  . HOLMIUM LASER APPLICATION Right 06/19/4480   Procedure: HOLMIUM LASER APPLICATION;  Surgeon: Franchot Gallo, MD;  Location: [facility];  Service: Urology;  Laterality: Right;  . KNEE ARTHROSCOPY Right 2011  . MENISCUS REPAIR Left 07/2000  . NEPHROLITHOTOMY Right 06/04/2013   Procedure: NEPHROLITHOTOMY PERCUTANEOUS;  Surgeon: Franchot Gallo, MD;  Location: [facility];  Service: Urology;  Laterality: Right;  . removal of growth  2007   growth under kidney removed and possibly kindey stones removed  . TOTAL SHOULDER ARTHROPLASTY Right 10/2007  . URETERAL STENT PLACEMENT Bilateral    for stenosis; "they've been removed"    OB History    No data available       Home Medications    Prior to Admission medications   Medication Sig Start Date End Date Taking? Authorizing Provider  acetaminophen (TYLENOL) 500 MG tablet Take 500 mg by mouth at bedtime.    Yes Historical Provider, MD  cholecalciferol (VITAMIN D) 1000 UNITS tablet Take 1,000 Units by mouth every morning.    Yes Historical Provider, MD  CRANBERRY PO Take 1 capsule by mouth every morning.    Yes Historical Provider, MD  diazepam (VALIUM) 5 MG tablet Take 2.5-5 mg by mouth every 12 (twelve) hours as needed for anxiety.   Yes Historical Provider, MD  digoxin (LANOXIN) 0.125 MG tablet Take 0.125 mg by mouth every Monday, Wednesday, and Friday.  01/14/13  Yes Marton Redwood, MD  divalproex (DEPAKOTE) 250 MG DR tablet Take 250 mg by mouth 2 (two) times daily. 12/20/16  Yes Historical Provider, MD  mirtazapine (REMERON) 15 MG tablet Take 15 mg by mouth at bedtime.    Yes Historical Provider, MD  NAMZARIC 28-10 MG CP24 Take 1 capsule by mouth each night  TO REPLACE  DONEPEZIL 02/13/16  Yes Historical Provider, MD  Omega-3 Fatty Acids (FISH OIL PO) Take 1 capsule by mouth every morning.    Yes Historical Provider, MD  Rivaroxaban (XARELTO) 20 MG TABS tablet Take 20 mg by mouth at bedtime.  06/24/13  Yes Darlin Coco, MD  rosuvastatin (CRESTOR) 5 MG tablet Take 2.5 mg by mouth at bedtime.   Yes Historical Provider, MD  vitamin C (ASCORBIC ACID) 500 MG tablet Take 500 mg by mouth every morning.    Yes Historical Provider, MD  cephALEXin (KEFLEX) 500 MG capsule Take 1 capsule (500 mg total) by mouth 3 (three) times daily. Patient not taking: Reported on 01/23/2017 01/08/17   Tanna Furry, MD  furosemide (LASIX) 20 MG tablet Take 1 tablet (20 mg total) by mouth as needed for fluid or edema. Patient not taking: Reported on 01/08/2017 02/29/16   Almyra Deforest, PA  metoprolol tartrate (LOPRESSOR) 25 MG tablet Take 0.5 tablets (12.5 mg total) by mouth every morning. 01/24/17   Almyra Deforest, PA  potassium  chloride (K-DUR) 10 MEQ tablet Take 1 tablet (10 mEq total) by mouth 2 (two) times daily. Patient not taking: Reported on 01/23/2017 01/08/17   Tanna Furry, MD    Family History Family History  Problem Relation Age of Onset  . Heart disease Mother   . Heart attack Mother 27    Social History Social History  Substance Use Topics  . Smoking status: Former Smoker    Packs/day: 0.25    Years: 5.00    Types: Cigarettes    Quit date: 07/20/1981  . Smokeless tobacco: Never Used  . Alcohol use No     Allergies   Sulfa antibiotics and Zocor [simvastatin]   Review of Systems Review of Systems  Constitutional: Negative for fever.  HENT: Negative for sore throat.   Eyes: Negative for visual disturbance.  Respiratory: Negative for cough and shortness of breath.   Cardiovascular: Negative for chest pain.  Gastrointestinal: Negative for abdominal pain, blood in stool, diarrhea, nausea and vomiting.  Genitourinary: Negative for difficulty urinating.  Musculoskeletal: Negative  for back pain and neck pain.  Skin: Negative for rash.  Neurological: Positive for syncope (pt does not think so but family reports yes) and light-headedness. Negative for facial asymmetry, weakness, numbness and headaches.     Physical Exam Updated Vital Signs BP (!) 122/59 (BP Location: Left Arm)   Pulse 68   Temp 98 F (36.7 C) (Oral)   Resp 18   Ht 5' 5.5" (1.664 m)   Wt 122 lb 9.2 oz (55.6 kg)   SpO2 96%   BMI 20.09 kg/m   Physical Exam  Constitutional: She is oriented to person, place, and time. She appears well-developed and well-nourished. No distress.  HENT:  Head: Normocephalic and atraumatic.  Eyes: Conjunctivae and EOM are normal.  Neck: Normal range of motion.  Cardiovascular: Normal rate, regular rhythm, normal heart sounds and intact distal pulses.  Exam reveals no gallop and no friction rub.   No murmur heard. Pulmonary/Chest: Effort normal and breath sounds normal. No respiratory distress. She has no wheezes. She has no rales.  Abdominal: Soft. She exhibits no distension. There is no tenderness. There is no guarding.  Musculoskeletal: She exhibits no edema or tenderness.  Neurological: She is alert and oriented to person, place, and time.  Skin: Skin is warm and dry. No rash noted. She is not diaphoretic. No erythema.  Nursing note and vitals reviewed.    ED Treatments / Results  Labs (all labs ordered are listed, but only abnormal results are displayed) Labs Reviewed  CBC WITH DIFFERENTIAL/PLATELET - Abnormal; Notable for the following:       Result Value   Lymphs Abs 0.5 (*)    All other components within normal limits  COMPREHENSIVE METABOLIC PANEL - Abnormal; Notable for the following:    Glucose, Bld 202 (*)    Total Protein 6.4 (*)    Albumin 3.0 (*)    ALT <5 (*)    Total Bilirubin 1.4 (*)    GFR calc non Af Amer 54 (*)    All other components within normal limits  URINALYSIS, ROUTINE W REFLEX MICROSCOPIC - Abnormal; Notable for the  following:    APPearance CLOUDY (*)    Hgb urine dipstick SMALL (*)    Protein, ur 100 (*)    Leukocytes, UA LARGE (*)    Bacteria, UA MANY (*)    Squamous Epithelial / LPF 0-5 (*)    All other components within normal limits  DIGOXIN  LEVEL - Abnormal; Notable for the following:    Digoxin Level 0.3 (*)    All other components within normal limits  TSH - Abnormal; Notable for the following:    TSH 5.133 (*)    All other components within normal limits  LACTIC ACID, PLASMA - Abnormal; Notable for the following:    Lactic Acid, Venous 2.7 (*)    All other components within normal limits  LACTIC ACID, PLASMA - Abnormal; Notable for the following:    Lactic Acid, Venous 2.3 (*)    All other components within normal limits  CBC - Abnormal; Notable for the following:    RBC 3.46 (*)    Hemoglobin 10.7 (*)    HCT 32.7 (*)    All other components within normal limits  BASIC METABOLIC PANEL - Abnormal; Notable for the following:    Potassium 3.2 (*)    Chloride 113 (*)    All other components within normal limits  I-STAT CG4 LACTIC ACID, ED - Abnormal; Notable for the following:    Lactic Acid, Venous 2.84 (*)    All other components within normal limits  CULTURE, BLOOD (ROUTINE X 2)  CULTURE, BLOOD (ROUTINE X 2)  URINE CULTURE  MAGNESIUM  PHOSPHORUS  MAGNESIUM  VITAMIN B12  I-STAT TROPOININ, ED    EKG  EKG Interpretation  Date/Time:  Wednesday January 23 2017 15:13:30 EST Ventricular Rate:  60 PR Interval:    QRS Duration: 138 QT Interval:  414 QTC Calculation: 414 R Axis:   100 Text Interpretation:  Atrial fibrillation Right bundle branch block Repol abnrm suggests ischemia, diffuse leads No significant change since last tracing Confirmed by [facility] MD, Althia Egolf ([id number]) on 01/23/2017 4:31:18 PM       Radiology Dg Chest 2 View  Result Date: 01/23/2017 CLINICAL DATA:  Syncope x2 today. EXAM: CHEST  2 VIEW COMPARISON:  Single-view of the chest 01/08/2017. PA and lateral  chest 06/24/2015. FINDINGS: The lungs are clear. There is cardiomegaly. No pneumothorax or pleural effusion. Aortic atherosclerosis is seen. No acute bony abnormality. The patient is status post right shoulder replacement. IMPRESSION: Cardiomegaly without acute disease. Electronically Signed   By: Inge Rise M.D.   On: 01/23/2017 17:06   Ct Renal Stone Study  Result Date: 01/23/2017 CLINICAL DATA:  83 year old female with history of nausea and syncope. Recurrent urinary tract infections. EXAM: CT ABDOMEN AND PELVIS WITHOUT CONTRAST TECHNIQUE: Multidetector CT imaging of the abdomen and pelvis was performed following the standard protocol without IV contrast. COMPARISON:  CT the abdomen and pelvis 07/12/2014. FINDINGS: Lower chest: Scarring in the left lower lobe. Mild cardiomegaly. Atherosclerotic calcifications in the left anterior descending, left circumflex and right coronary arteries. Hepatobiliary: Calcified granuloma in the liver again noted. Several small well-defined low-attenuation lesions are again noted scattered throughout the liver, incompletely characterized on today's noncontrast CT examination, but similar to the prior study, presumably small cysts, largest of which measures up to 1.8 cm in segment 6. Gallbladder is normal in appearance. Pancreas: Extending off the inferior aspect of the body of the pancreas there is a well-circumscribed 4.3 x 2.4 x 3.6 cm low-attenuation lesion (axial image 27 of series 2 and sagittal image 113 of series 4) which is new compared to the prior examination, and incompletely characterized on today's noncontrast CT examination, but favored to represent a pancreatic pseudocyst (this measures only -27 HU, suggesting that it contains fluid with some saponified fat). Spleen: Unremarkable. Adrenals/Urinary Tract: 11 mm calculus in the proximal third  of the left upper pole ureter (left renal collecting system and proximal ureters are duplicated) shortly beyond the  left ureteropelvic junction. This is associated with mild proximal left hydroureteronephrosis involving the upper pole moiety. No additional calculi are noted within the right renal collecting system, elsewhere along the course of either ureter, or within the lumen of the urinary bladder. Right kidney and bilateral adrenal glands are normal in appearance. Left kidney is partially distorted by a mass. It is uncertain whether not this mass arises from the left kidney or is adjacent to the left kidney. Previous studies have demonstrated a partially calcified soft tissue mass in this region, however, this mass appears anteriorly displaced by a new larger intermediate attenuation mass on today's examination. Specifically, the new mass is favored to arise from the lower pole of the left kidney and measures 7.2 x 9.4 x 9.9 cm (axial image 43 of series 2 and coronal image 58 of series 3) and measures intermediate attenuation (40 HU). There is also a large low-attenuation perinephric fluid collection posterior to the left kidney which measures 4.6 x 0.3 x 6.7 cm (axial image 39 of series 2 and coronal image 78 of series 3). Unenhanced appearance of the urinary bladder is normal. Stomach/Bowel: Appearance of the stomach is normal. There is no pathologic dilatation of small bowel or colon. Normal appendix. Vascular/Lymphatic: Aortic atherosclerosis, without definite aneurysm in the abdominal or pelvic vasculature. No definite lymphadenopathy noted in the abdomen or pelvis on today's noncontrast CT examination. Reproductive: Status post hysterectomy. Ovaries are not confidently identified may be surgically absent or atrophic. Other: Previously described soft tissue mass in the left side of the abdomen is similar in size to prior examinations, currently measuring 2.9 x 5.5 x 4.7 cm (axial image 43 of series 2 and coronal image 39 of series 3), but has been displaced anteriorly by the large left-sided renal or perirenal mass  discussed above. Musculoskeletal: Multiple chronic sclerotic lesions are again noted throughout the pelvis, similar to prior examinations, presumably benign. No other new aggressive appearing lytic or blastic lesions are noted in the visualized portions of the skeleton. IMPRESSION: 1. 11 mm calculus in the proximal third of the left ureter from the upper pole moiety of the patient's left kidney (duplicated left renal collecting system and proximal ureters), with mild proximal left hydroureteronephrosis indicative of at least partial obstruction. 2. Large mass in the left side of the retroperitoneum which appears intimately associated with the lower pole of the left kidney and is favored to represent a primary renal neoplasm such as a renal cell carcinoma. This may alternatively represent a large hemorrhage, however, that is not favored. This could be further delineated with MRI of the abdomen with and without IV gadolinium if clinically appropriate. 3. New low-attenuation (-27 HU) cystic appearing lesion extending off the inferior aspect of the mid body of the pancreas, favored to represent a pancreatic pseudocyst. This too could be better evaluated with followup MRI of the abdomen with and without IV gadolinium. 4. Large left-sided low-attenuation perinephric fluid collection could represent a urinoma in the setting of an obstructed upper pole collecting system. 5. Aortic atherosclerosis, in addition to at least 3 vessel coronary artery disease. 6. Cardiomegaly. 7. Additional incidental findings, as above. Electronically Signed   By: Vinnie Langton M.D.   On: 01/23/2017 19:41    Procedures Procedures (including critical care time)  Medications Ordered in ED Medications  sodium chloride 0.9 % bolus 1,000 mL (not administered)  divalproex (  DEPAKOTE) DR tablet 250 mg (250 mg Oral Given 01/24/17 1214)  metoprolol tartrate (LOPRESSOR) tablet 12.5 mg (12.5 mg Oral Given 01/24/17 1213)  cholecalciferol (VITAMIN  D) tablet 1,000 Units (1,000 Units Oral Given 01/24/17 1214)  vitamin C (ASCORBIC ACID) tablet 500 mg (500 mg Oral Given 01/24/17 1213)  acetaminophen (TYLENOL) tablet 650 mg (not administered)  mirtazapine (REMERON) tablet 15 mg (15 mg Oral Given 01/23/17 2218)  rosuvastatin (CRESTOR) tablet 2.5 mg (2.5 mg Oral Given 01/23/17 2218)  omega-3 acid ethyl esters (LOVAZA) capsule 1 g (1 g Oral Given 01/24/17 1213)  diazepam (VALIUM) tablet 2.5 mg (not administered)  rivaroxaban (XARELTO) tablet 20 mg (20 mg Oral Given 01/23/17 2217)  digoxin (LANOXIN) tablet 0.125 mg (not administered)  sodium chloride flush (NS) 0.9 % injection 3 mL (3 mLs Intravenous Given 01/24/17 1214)  0.9 %  sodium chloride infusion ( Intravenous New Bag/Given 01/23/17 2025)  ondansetron (ZOFRAN) tablet 4 mg (not administered)    Or  ondansetron (ZOFRAN) injection 4 mg (not administered)  cefTRIAXone (ROCEPHIN) 1 g in dextrose 5 % 50 mL IVPB (not administered)  senna-docusate (Senokot-S) tablet 1 tablet (1 tablet Oral Given 01/24/17 1212)  polyethylene glycol (MIRALAX / GLYCOLAX) packet 17 g (17 g Oral Given 01/24/17 1212)  memantine (NAMENDA XR) 24 hr capsule 28 mg (28 mg Oral Given 01/23/17 2217)    And  donepezil (ARICEPT) tablet 10 mg (10 mg Oral Given 01/23/17 2220)  cefTRIAXone (ROCEPHIN) 1 g in dextrose 5 % 50 mL IVPB (0 g Intravenous Stopped 01/23/17 1826)  sodium chloride 0.9 % bolus 1,000 mL (1,000 mLs Intravenous New Bag/Given 01/23/17 1851)  sodium chloride 0.9 % bolus 500 mL (500 mLs Intravenous Given 01/24/17 0347)     Initial Impression / Assessment and Plan / ED Course  I have reviewed the triage vital signs and the nursing notes.  Pertinent labs & imaging results that were available during my care of the patient were reviewed by me and considered in my medical decision making (see chart for details).     83 year old female with a history of dementia, atrial fibrillation, CHF, dyslipidemia, hypertension presents with concern for  2 syncopal episodes observed by her daughter. Patient with normal hemoglobin, no signs to suggest a GI bleed. EKG shows atrial fibrillation which is rate controlled, no other significant changes. Electrolytes are within normal limits. No focal neurologic findings, and have low suspicion for CVA. They report a recent UTI, however she has no signs of sepsis by history, exam and labs at this time.  Urinalysis currently pending.  No chest pain and doubt ACS/PE.  No prior hx of syncope.  Possible etiologies of episode include dehydration, arrhythmia.  Given hx of CHF, family preference, will admit to syncope observation. UA pending.   UA shows signs of UTI.  Blood cx ordered. Lactic acid ordered elevated, possibly secondary to dehydration versus possible sepsis. GIven 2L of IV fluid, rocephin, hospitalist to admit.  Final Clinical Impressions(s) / ED Diagnoses   Final diagnoses:  Syncope, unspecified syncope type  Urinary Tract Infection    New Prescriptions Current Discharge Medication List       Gareth Morgan, MD 01/24/17 1225

## 2017-01-23 NOTE — ED Notes (Signed)
Hospitalist at bedside 

## 2017-01-23 NOTE — ED Notes (Signed)
Bed: WA17 Expected date:  Expected time:  Means of arrival:  Comments: EMS Fall/[facility]

## 2017-01-24 ENCOUNTER — Other Ambulatory Visit: Payer: Self-pay

## 2017-01-24 ENCOUNTER — Other Ambulatory Visit: Payer: Self-pay | Admitting: Urology

## 2017-01-24 DIAGNOSIS — R55 Syncope and collapse: Secondary | ICD-10-CM

## 2017-01-24 DIAGNOSIS — I482 Chronic atrial fibrillation: Secondary | ICD-10-CM

## 2017-01-24 DIAGNOSIS — N139 Obstructive and reflux uropathy, unspecified: Secondary | ICD-10-CM

## 2017-01-24 DIAGNOSIS — N200 Calculus of kidney: Secondary | ICD-10-CM

## 2017-01-24 DIAGNOSIS — N2889 Other specified disorders of kidney and ureter: Secondary | ICD-10-CM

## 2017-01-24 LAB — BASIC METABOLIC PANEL
Anion gap: 8 (ref 5–15)
BUN: 15 mg/dL (ref 6–20)
BUN: 15 mg/dL (ref 4–21)
CO2: 24 mmol/L (ref 22–32)
Calcium: 9 mg/dL (ref 8.9–10.3)
Chloride: 113 mmol/L — ABNORMAL HIGH (ref 101–111)
Creatinine, Ser: 0.68 mg/dL (ref 0.44–1.00)
Creatinine: 0.7 mg/dL (ref 0.5–1.1)
GFR calc Af Amer: >60 mL/min (ref >60)
GFR calc non Af Amer: >60 mL/min (ref >60)
Glucose, Bld: 90 mg/dL (ref 65–99)
Glucose: 90 mg/dL
Potassium: 3.2 mmol/L — AB (ref 3.4–5.3)
Potassium: 3.2 mmol/L — ABNORMAL LOW (ref 3.5–5.1)
Sodium: 145 mmol/L (ref 135–145)
Sodium: 145 mmol/L (ref 137–147)

## 2017-01-24 LAB — CBC
HCT: 32.7 % — ABNORMAL LOW (ref 36.0–46.0)
Hemoglobin: 10.7 g/dL — ABNORMAL LOW (ref 12.0–15.0)
MCH: 30.9 pg (ref 26.0–34.0)
MCHC: 32.7 g/dL (ref 30.0–36.0)
MCV: 94.5 fL (ref 78.0–100.0)
Platelets: 171 10*3/uL (ref 150–400)
RBC: 3.46 MIL/uL — ABNORMAL LOW (ref 3.87–5.11)
RDW: 14 % (ref 11.5–15.5)
WBC: 8.5 10*3/uL (ref 4.0–10.5)

## 2017-01-24 LAB — PROTIME-INR
INR: 1.9
Prothrombin Time: 22 seconds — ABNORMAL HIGH (ref 11.4–15.2)

## 2017-01-24 LAB — CBC AND DIFFERENTIAL
HCT: 33 % — AB (ref 36–46)
HEMOGLOBIN: 10.7 g/dL — AB (ref 12.0–16.0)
PLATELETS: 171 10*3/uL (ref 150–399)
WBC: 8.5 10*3/mL

## 2017-01-24 LAB — LACTIC ACID, PLASMA
LACTIC ACID, VENOUS: 2.7 mmol/L — AB (ref 0.5–1.9)
Lactic Acid, Venous: 2.3 mmol/L (ref 0.5–1.9)

## 2017-01-24 LAB — APTT: aPTT: 44 seconds — ABNORMAL HIGH (ref 24–36)

## 2017-01-24 LAB — VITAMIN B12: VITAMIN B 12: 462 pg/mL (ref 180–914)

## 2017-01-24 MED ORDER — METOPROLOL TARTRATE 25 MG PO TABS
12.5000 mg | ORAL_TABLET | Freq: Every day | ORAL | Status: DC
  Administered 2017-01-25 – 2017-01-27 (×3): 12.5 mg via ORAL
  Filled 2017-01-24 (×3): qty 1

## 2017-01-24 MED ORDER — SODIUM CHLORIDE 0.9 % IV BOLUS (SEPSIS)
500.0000 mL | Freq: Once | INTRAVENOUS | Status: AC
  Administered 2017-01-24: 500 mL via INTRAVENOUS

## 2017-01-24 MED ORDER — METOPROLOL TARTRATE 25 MG PO TABS
12.5000 mg | ORAL_TABLET | Freq: Every morning | ORAL | 5 refills | Status: DC
Start: 2017-01-24 — End: 2017-01-28

## 2017-01-24 MED ORDER — POTASSIUM CHLORIDE CRYS ER 20 MEQ PO TBCR
40.0000 meq | EXTENDED_RELEASE_TABLET | ORAL | Status: AC
  Administered 2017-01-24: 40 meq via ORAL
  Filled 2017-01-24: qty 2

## 2017-01-24 MED ORDER — LEVOFLOXACIN IN D5W 500 MG/100ML IV SOLN
500.0000 mg | INTRAVENOUS | Status: AC
  Administered 2017-01-25: 500 mg via INTRAVENOUS
  Filled 2017-01-24: qty 100

## 2017-01-24 MED ORDER — HEPARIN (PORCINE) IN NACL 100-0.45 UNIT/ML-% IJ SOLN
800.0000 [IU]/h | INTRAMUSCULAR | Status: DC
  Administered 2017-01-24: 800 [IU]/h via INTRAVENOUS
  Filled 2017-01-24: qty 250

## 2017-01-24 MED ORDER — BOOST / RESOURCE BREEZE PO LIQD
1.0000 | Freq: Three times a day (TID) | ORAL | Status: DC
  Administered 2017-01-25 – 2017-01-28 (×5): 1 via ORAL

## 2017-01-24 MED ORDER — SODIUM CHLORIDE 0.9 % IV SOLN
INTRAVENOUS | Status: AC
  Administered 2017-01-24: 15:00:00 via INTRAVENOUS

## 2017-01-24 NOTE — Progress Notes (Signed)
Called lab to verify pt was to get Lactic acid drawn every 3 hours. Phlebotomy states lab order not showing on the pending list. Order re-entered so that, lactic acid level can be drawn as previously ordered.

## 2017-01-24 NOTE — Progress Notes (Signed)
Initial Nutrition Assessment  DOCUMENTATION CODES:   Severe malnutrition in context of chronic illness  INTERVENTION:   Boost Breeze po TID, each supplement provides 250 kcal and 9 grams of protein  Magic cup TID with meals, each supplement provides 290 kcal and 9 grams of protein  NUTRITION DIAGNOSIS:   Malnutrition related to poor appetite, chronic illness as evidenced by moderate depletion of body fat, severe depletion of muscle mass, 8 percent weight loss in 1 month.  GOAL:   Patient will meet greater than or equal to 90% of their needs  MONITOR:   PO intake, Supplement acceptance, Labs, Weight trends  REASON FOR ASSESSMENT:   Malnutrition Screening Tool    ASSESSMENT:   83 y/o female with PMH significant for dementia, recurrent UTI's, A. Fib (on xarelto), HTN, HLD and depression; who presented to ED after experiencing 2 short episodes of syncope on the day of admission. Patient admitted for dehydration, UTI, and fecal impaction   Met with patient and pt's daughter in room today. Pt's daughter reports that pt has 24 hr around the clock care at home. Daughter reports that pt has not been eating well for months. Pt does drink two Boost Breeze daily at home. Daughter reports that pt has had some trouble with constipation lately. Per chart, pt has lost 10lbs(8%) over the past month. This is severe. Pt with moderate fat depletions and moderate to severe muscle wasting. RD observed pt's lunch tray that was <25% eaten. RD spoke extensively with pt's daughter and discussed the importance of adequate protein intake to preserve lean muscle mass and adequate hydration. RD provided pt's daughter with some ways to increase pt's fluid and protein intake at home. Pt currently NPO for MRI of kidney later today. Pt has history of kidney stones. Pt's daughter would like pt to try Magic Cups. Pt likes Boost Breeze; RD will order. Pt with hypokalemia today; monitor and supplement as needed per MD  discretion.   Medications reviewed and include: ceftriaxone, Vit D, remeron, Omega 3, miralax, senokot,  Vit C  Labs reviewed: K 3.2(L), Cl 113(H), P 2.7 wnl, Mg 1.7 wnl  Nutrition-Focused physical exam completed. Findings are moderate fat depletion, moderate to severe muscle depletion, and no edema.   Diet Order:  Diet NPO time specified  Skin:  Reviewed, no issues  Last BM:  3/7- fecal impaction   Height:   Ht Readings from Last 1 Encounters:  01/23/17 5' 5.5" (1.664 m)    Weight:   Wt Readings from Last 1 Encounters:  01/24/17 122 lb 9.2 oz (55.6 kg)    Ideal Body Weight:  57.9 kg  BMI:  Body mass index is 20.09 kg/m.  Estimated Nutritional Needs:   Kcal:  1600-1800kcal/day   Protein:  83-94g/day   Fluid:  >1.6L/day   EDUCATION NEEDS:   No education needs identified at this time  Koleen Distance, RD, LDN Pager #[phone]

## 2017-01-24 NOTE — Consult Note (Signed)
New Urology Consult Note   Requesting Attending Physician:  Barton Dubois, MD Service Providing Consult: Urology Consulting Attending: Junious Silk, MD  Assessment:  Patient is a 83 y.o. female with history of dementia, recurrent UTI, nephrolithiasis, CHF, atrial fibrillation, hypertension, hyperlipidemia, depression  Admitted yesterday for syncope and the setting of known UTI.   Incontinent at baseline.  Currently on ceftriaxone for infected urine, cultures pending.   Has required multiple ureteroscopic procedures for kidney stones in the past.  CT scan showed an 11 mm calculus in the proximal third of the left upper pole moiety ureter from a partially duplicated collecting system, mild hydronephrosis proximal to this, a large mass associated with the lower pole of the left kidney and a possible urinoma surrounding the left kidney.  Currently hemodynamically stable with a plan to go for an MRI of the abdomen and pelvis later this evening. Clearly palpable left flank mass on exam.  Recommendations: 1. Agree with MRI for further assessment of left renal mass 2. Follow-up urine and blood cultures, tailor antibiotics accordingly 3. Please keep NPO after midnight for cystoscopy, left ureteral stent placement to take place tomorrow with Dr. Junious Silk 4. Continue rocephin 5. Please notify urology if patient were to become hemodynamically unstable or have spiking fevers as she would require emergent stent placement  Thank you for this consult. Please contact the urology consult pager with any further questions/concerns. Sharmaine Base, MD Urology Surgical Resident   HPI -   Reason for Consult:  Nephrolithiasis, left renal mass  Penny [name] is seen in consultation for reasons noted above at the request of Barton Dubois, MD.  83 year old female with past medical history significant for dementia, recurrent urinary tract infection, CHF, nephrolithiasis, atrial fibrillation on Xarelto, hypertension,  hyperlipidemia and depression.  She was admitted last evening after presenting to the ER with 2 short episodes of syncope.  She was recently diagnosed with UTI 2 weeks ago and has not completely returned to her baseline in terms of by mouth intake.  She is incontinent at baseline.  She has had constipation recently.  She has also had some bright red blood per rectum intermittently and darker than normal urine.   Accompanied by daughter tonight. She reportedly never complains of pain. Has had [place] in past, but not recently. Always has poor PO appetite. 10 pound weight loss over unknown time period. No new back, abdominal, flank pain.  Has history of Klebsiella & Morganella UTIs in past. Currently on CTX. She had a bladder suspension procedure with hysterectomy in the past.  She was required cystoscopic management for urolithiasis in the past including an 2015 and 2016 with Dr. Diona Fanti. She had an elevated lactic acid in the ER, 2.8.  Her urinalysis was suggestive of infection with many bacteria, large leukocytes, too numerous to count white blood cells.  Urine culture is obtained.  Creatinine 0.93.  Blood cultures pending.  Lactic acid has come down to 2.2 this morning with IV hydration and antibiotic administration. Current last evening showed an 11 mm calculus in the proximal third of the left ureter from the upper pole moiety ureter in a partially duplicated collecting system with mild proximal left hydroureteronephrosis and possible urinoma surrounding the obstructed upper pole collecting system.  She was also noted have a large mass in the left side of the retroperitoneum intimately associated with the lower pole of the left kidney favored to represent RCC.  She is hemodynamically stable.  Past Medical History: Past Medical History:  Diagnosis  Date  . Alzheimer's dementia 03/31/2014  . Anginal pain ([place])   . Anxiety   . Arthritis    "joints" (03/31/2014)  . Atrial fibrillation ([place])   . Blood in  urine    "chronic" (03/31/2014)  . CHF (congestive heart failure) ([place])   . Chronic shoulder pain   . Depression   . Dyslipidemia   . Dysrhythmia    atrail fib   . Heart palpitations    rare  . Humerus fracture 03/31/2014   "fell and broke left arm"  . Hx of transfusion of packed red blood cells   . Hyperlipidemia   . Hypertension   . Kidney stones   . Long-term (current) use of anticoagulants   . Memory problem    "a little short term and long term memory loss" (03/31/2014)  . Osteopenia    "severe" (03/31/2014)  . Seasonal allergies   . Syncope and collapse 03/31/2014    Past Surgical History:  Past Surgical History:  Procedure Laterality Date  . APPENDECTOMY  ~ 1953  . BLADDER SUSPENSION     w/hysterectomy  . CARDIOVERSION  2006  . CYSTOSCOPY WITH RETROGRADE PYELOGRAM, URETEROSCOPY AND STENT PLACEMENT Bilateral 08/26/2014   Procedure: CYSTOSCOPY WITH RETROGRADE PYELOGRAM, URETEROSCOPY , EXTRACTION OF STONES AND STENT PLACEMENT;  Surgeon: Jorja Loa, MD;  Location: [facility];  Service: Urology;  Laterality: Bilateral;  . CYSTOSCOPY WITH STENT PLACEMENT Right 06/24/2015   Procedure: CYSTOSCOPY URETEROSCOPY, PYLEOGRAM, WITH STENT PLACEMENT X2 ;  Surgeon: Franchot Gallo, MD;  Location: [facility];  Service: Urology;  Laterality: Right;  . CYSTOSCOPY WITH URETEROSCOPY, STONE BASKETRY AND STENT PLACEMENT Bilateral 07/21/2015   Procedure: CYSTOSCOPY WITH  right URETEROSCOPY, STONE BASKETRY bilateral pylegram  removal bilateral double j stents;  Surgeon: Franchot Gallo, MD;  Location: [facility];  Service: Urology;  Laterality: Bilateral;  . CYSTOSCOPY/RETROGRADE/URETEROSCOPY/STONE EXTRACTION WITH BASKET Right 10/08/2014   Procedure: CYSTOSCOPY, retrograde pyelogram,URETEROSCOPY, laser treatment of calculus, stent exchange;  Surgeon: Jorja Loa, MD;  Location: [facility];  Service: Urology;  Laterality: Right;  . HOLMIUM LASER APPLICATION Left 78/12/9560   Procedure: HOLMIUM LASER  APPLICATION;  Surgeon: Jorja Loa, MD;  Location: [facility];  Service: Urology;  Laterality: Left;  . HOLMIUM LASER APPLICATION Right 11/22/863   Procedure: HOLMIUM LASER APPLICATION;  Surgeon: Franchot Gallo, MD;  Location: [facility];  Service: Urology;  Laterality: Right;  . KNEE ARTHROSCOPY Right 2011  . MENISCUS REPAIR Left 07/2000  . NEPHROLITHOTOMY Right 06/04/2013   Procedure: NEPHROLITHOTOMY PERCUTANEOUS;  Surgeon: Franchot Gallo, MD;  Location: [facility];  Service: Urology;  Laterality: Right;  . removal of growth  2007   growth under kidney removed and possibly kindey stones removed  . TOTAL SHOULDER ARTHROPLASTY Right 10/2007  . URETERAL STENT PLACEMENT Bilateral    for stenosis; "they've been removed"    Medication: Current Facility-Administered Medications  Medication Dose Route Frequency Provider Last Rate Last Dose  . 0.9 %  sodium chloride infusion   Intravenous Continuous Barton Dubois, MD 75 mL/hr at 01/24/17 1515    . acetaminophen (TYLENOL) tablet 650 mg  650 mg Oral Q6H PRN Barton Dubois, MD      . cefTRIAXone (ROCEPHIN) 1 g in dextrose 5 % 50 mL IVPB  1 g Intravenous Q24H Barton Dubois, MD      . cholecalciferol (VITAMIN D) tablet 1,000 Units  1,000 Units Oral Daily Barton Dubois, MD   1,000 Units at 01/24/17 1214  . diazepam (VALIUM) tablet 2.5 mg  2.5 mg Oral Q12H PRN Barton Dubois, MD      . Derrill Memo ON 01/25/2017] digoxin (LANOXIN) tablet 0.125 mg  0.125 mg Oral Q M,W,F Barton Dubois, MD      . divalproex (DEPAKOTE) DR tablet 250 mg  250 mg Oral BID Barton Dubois, MD   250 mg at 01/24/17 1214  . memantine (NAMENDA XR) 24 hr capsule 28 mg  28 mg Oral QHS Barton Dubois, MD   28 mg at 01/23/17 2217   And  . donepezil (ARICEPT) tablet 10 mg  10 mg Oral QHS Barton Dubois, MD   10 mg at 01/23/17 2220  . feeding supplement (BOOST / RESOURCE BREEZE) liquid 1 Container  1 Container Oral TID BM Barton Dubois, MD      . Derrill Memo ON 01/25/2017] metoprolol tartrate (LOPRESSOR)  tablet 12.5 mg  12.5 mg Oral Daily Thayer Headings, MD      . mirtazapine (REMERON) tablet 15 mg  15 mg Oral QHS Barton Dubois, MD   15 mg at 01/23/17 2218  . omega-3 acid ethyl esters (LOVAZA) capsule 1 g  1 g Oral Daily Barton Dubois, MD   1 g at 01/24/17 1213  . ondansetron (ZOFRAN) tablet 4 mg  4 mg Oral Q6H PRN Barton Dubois, MD       Or  . ondansetron [facility]) injection 4 mg  4 mg Intravenous Q6H PRN Barton Dubois, MD      . polyethylene glycol (MIRALAX / GLYCOLAX) packet 17 g  17 g Oral Daily Barton Dubois, MD   17 g at 01/24/17 1212  . potassium chloride SA (K-DUR,KLOR-CON) CR tablet 40 mEq  40 mEq Oral Q4H Barton Dubois, MD      . rosuvastatin (CRESTOR) tablet 2.5 mg  2.5 mg Oral QHS Barton Dubois, MD   2.5 mg at 01/23/17 2218  . senna-docusate (Senokot-S) tablet 1 tablet  1 tablet Oral BID Barton Dubois, MD   1 tablet at 01/24/17 1212  . sodium chloride 0.9 % bolus 1,000 mL  1,000 mL Intravenous Once Gareth Morgan, MD      . sodium chloride flush (NS) 0.9 % injection 3 mL  3 mL Intravenous Q12H Barton Dubois, MD   3 mL at 01/24/17 1214  . vitamin C (ASCORBIC ACID) tablet 500 mg  500 mg Oral Daily Barton Dubois, MD   500 mg at 01/24/17 1213    Allergies: Allergies  Allergen Reactions  . Sulfa Antibiotics Rash  . Zocor [Simvastatin] Other (See Comments)    Old allergy. Unsure reaction    Social History: Social History  Substance Use Topics  . Smoking status: Former Smoker    Packs/day: 0.25    Years: 5.00    Types: Cigarettes    Quit date: 07/20/1981  . Smokeless tobacco: Never Used  . Alcohol use No    Family History Family History  Problem Relation Age of Onset  . Heart disease Mother   . Heart attack Mother 21    Review of Systems 10 systems were reviewed and are negative except as noted specifically in the HPI.  Objective   Vital signs in last 24 hours: BP 129/75 (BP Location: Right Arm)   Pulse 84   Temp 98.2 F (36.8 C) (Oral)   Resp 18   Ht 5' 5.5"  (1.664 m)   Wt 55.6 kg (122 lb 9.2 oz)   SpO2 96%   BMI 20.09 kg/m   Intake/Output last 3 shifts: I/O last 3 completed shifts: In:  1388.8 [P.O.:120; I.V.:718.8; IV Piggyback:550] Out: 54 [Urine:60]  Physical Exam General: NAD, A&O, resting HEENT: [place]/AT, EOMI, MMM Pulmonary: Normal work of breathing on RA Cardiovascular: Regular rate & rhythm, HDS, adequate peripheral perfusion Abdomen: soft, TTP in LLQ, clearly palpable left flank mass, nondistended, no suprapubic fullness or tenderness GU: incontinent into a pad, no CVA tenderness bilaterally DRE: deferred Extremities: warm and well perfused, no edema  Most Recent Labs: Lab Results  Component Value Date   WBC 8.5 01/24/2017   HGB 10.7 (L) 01/24/2017   HCT 32.7 (L) 01/24/2017   PLT 171 01/24/2017    Lab Results  Component Value Date   NA 145 01/24/2017   K 3.2 (L) 01/24/2017   CL 113 (H) 01/24/2017   CO2 24 01/24/2017   BUN 15 01/24/2017   CREATININE 0.68 01/24/2017   CALCIUM 9.0 01/24/2017   MG 1.7 01/23/2017   PHOS 2.7 01/23/2017    Lab Results  Component Value Date   ALKPHOS 68 01/23/2017   BILITOT 1.4 (H) 01/23/2017   BILIDIR 0.2 02/26/2011   PROT 6.4 (L) 01/23/2017   ALBUMIN 3.0 (L) 01/23/2017   ALT <5 (L) 01/23/2017   AST 25 01/23/2017    Lab Results  Component Value Date   INR 1.90 01/24/2017   APTT 44 (H) 01/24/2017     Urine Culture: @[id number] (laburin,org,[id number])@    IMAGING: Dg Chest 2 View  Result Date: 01/23/2017 CLINICAL DATA:  Syncope x2 today. EXAM: CHEST  2 VIEW COMPARISON:  Single-view of the chest 01/08/2017. PA and lateral chest 06/24/2015. FINDINGS: The lungs are clear. There is cardiomegaly. No pneumothorax or pleural effusion. Aortic atherosclerosis is seen. No acute bony abnormality. The patient is status post right shoulder replacement. IMPRESSION: Cardiomegaly without acute disease. Electronically Signed   By: Inge Rise M.D.   On: 01/23/2017 17:06   Ct Renal  Stone Study  Result Date: 01/23/2017 CLINICAL DATA:  83 year old female with history of nausea and syncope. Recurrent urinary tract infections. EXAM: CT ABDOMEN AND PELVIS WITHOUT CONTRAST TECHNIQUE: Multidetector CT imaging of the abdomen and pelvis was performed following the standard protocol without IV contrast. COMPARISON:  CT the abdomen and pelvis 07/12/2014. FINDINGS: Lower chest: Scarring in the left lower lobe. Mild cardiomegaly. Atherosclerotic calcifications in the left anterior descending, left circumflex and right coronary arteries. Hepatobiliary: Calcified granuloma in the liver again noted. Several small well-defined low-attenuation lesions are again noted scattered throughout the liver, incompletely characterized on today's noncontrast CT examination, but similar to the prior study, presumably small cysts, largest of which measures up to 1.8 cm in segment 6. Gallbladder is normal in appearance. Pancreas: Extending off the inferior aspect of the body of the pancreas there is a well-circumscribed 4.3 x 2.4 x 3.6 cm low-attenuation lesion (axial image 27 of series 2 and sagittal image 113 of series 4) which is new compared to the prior examination, and incompletely characterized on today's noncontrast CT examination, but favored to represent a pancreatic pseudocyst (this measures only -27 HU, suggesting that it contains fluid with some saponified fat). Spleen: Unremarkable. Adrenals/Urinary Tract: 11 mm calculus in the proximal third of the left upper pole ureter (left renal collecting system and proximal ureters are duplicated) shortly beyond the left ureteropelvic junction. This is associated with mild proximal left hydroureteronephrosis involving the upper pole moiety. No additional calculi are noted within the right renal collecting system, elsewhere along the course of either ureter, or within the lumen of the urinary bladder. Right kidney and bilateral adrenal  glands are normal in appearance.  Left kidney is partially distorted by a mass. It is uncertain whether not this mass arises from the left kidney or is adjacent to the left kidney. Previous studies have demonstrated a partially calcified soft tissue mass in this region, however, this mass appears anteriorly displaced by a new larger intermediate attenuation mass on today's examination. Specifically, the new mass is favored to arise from the lower pole of the left kidney and measures 7.2 x 9.4 x 9.9 cm (axial image 43 of series 2 and coronal image 58 of series 3) and measures intermediate attenuation (40 HU). There is also a large low-attenuation perinephric fluid collection posterior to the left kidney which measures 4.6 x 0.3 x 6.7 cm (axial image 39 of series 2 and coronal image 78 of series 3). Unenhanced appearance of the urinary bladder is normal. Stomach/Bowel: Appearance of the stomach is normal. There is no pathologic dilatation of small bowel or colon. Normal appendix. Vascular/Lymphatic: Aortic atherosclerosis, without definite aneurysm in the abdominal or pelvic vasculature. No definite lymphadenopathy noted in the abdomen or pelvis on today's noncontrast CT examination. Reproductive: Status post hysterectomy. Ovaries are not confidently identified may be surgically absent or atrophic. Other: Previously described soft tissue mass in the left side of the abdomen is similar in size to prior examinations, currently measuring 2.9 x 5.5 x 4.7 cm (axial image 43 of series 2 and coronal image 39 of series 3), but has been displaced anteriorly by the large left-sided renal or perirenal mass discussed above. Musculoskeletal: Multiple chronic sclerotic lesions are again noted throughout the pelvis, similar to prior examinations, presumably benign. No other new aggressive appearing lytic or blastic lesions are noted in the visualized portions of the skeleton. IMPRESSION: 1. 11 mm calculus in the proximal third of the left ureter from the upper pole  moiety of the patient's left kidney (duplicated left renal collecting system and proximal ureters), with mild proximal left hydroureteronephrosis indicative of at least partial obstruction. 2. Large mass in the left side of the retroperitoneum which appears intimately associated with the lower pole of the left kidney and is favored to represent a primary renal neoplasm such as a renal cell carcinoma. This may alternatively represent a large hemorrhage, however, that is not favored. This could be further delineated with MRI of the abdomen with and without IV gadolinium if clinically appropriate. 3. New low-attenuation (-27 HU) cystic appearing lesion extending off the inferior aspect of the mid body of the pancreas, favored to represent a pancreatic pseudocyst. This too could be better evaluated with followup MRI of the abdomen with and without IV gadolinium. 4. Large left-sided low-attenuation perinephric fluid collection could represent a urinoma in the setting of an obstructed upper pole collecting system. 5. Aortic atherosclerosis, in addition to at least 3 vessel coronary artery disease. 6. Cardiomegaly. 7. Additional incidental findings, as above. Electronically Signed   By: Vinnie Langton M.D.   On: 01/23/2017 19:41

## 2017-01-24 NOTE — Consult Note (Addendum)
CONSULT NOTE  Date: 01/24/2017               Patient Name:  Penny [name] MRN: [id number]  DOB: [birth year] Age / Sex: 83 y.o., female        PCP: Marton Redwood Primary Cardiologist: Angelena Form previous [place]             Referring Physician: [place]               Reason for Consult: Nausea, syncope            History of Present Illness: Patient is a 83 y.o. female with a PMHx of dementia, recurrent UTI's, A. Fib (on xarelto), HTN, HLD and depression, who was admitted to [facility] on 01/23/2017 for evaluation of 2 brief episodes of syncope .   She developed a UTI 2 weeks ago and has not returned to baseline since then .  Has had poor PO intake for the past several weeks . Perhaps longer than that.   Has had a fecal impaction.   Was sitting in a chair.   Her daughter Penny [name] ( nurse in the CCU at [facility]) was with her . She had a brief episode of LOC.   Regained consciousness and then had another episode - a bit longer. Was brought into the [place] ER  Has not had any further episodes.     Last echo Mar 21, 2016 showed normal LV systolic function. [name] MVP with moderate MR Severe TR with est. PA pressure of 30       Medications: Outpatient medications: Prescriptions Prior to Admission  Medication Sig Dispense Refill Last Dose  . acetaminophen (TYLENOL) 500 MG tablet Take 500 mg by mouth at bedtime.    01/22/2017 at Unknown time  . cholecalciferol (VITAMIN D) 1000 UNITS tablet Take 1,000 Units by mouth every morning.    01/23/2017 at Unknown time  . CRANBERRY PO Take 1 capsule by mouth every morning.    01/23/2017 at Unknown time  . diazepam (VALIUM) 5 MG tablet Take 2.5-5 mg by mouth every 12 (twelve) hours as needed for anxiety.   unk  . digoxin (LANOXIN) 0.125 MG tablet Take 0.125 mg by mouth every Monday, Wednesday, and Friday.    01/23/2017 at Unknown time  . divalproex (DEPAKOTE) 250 MG DR tablet Take 250 mg by mouth 2 (two) times daily.   01/23/2017 at Unknown time  . mirtazapine  (REMERON) 15 MG tablet Take 15 mg by mouth at bedtime.    01/22/2017 at Unknown time  . NAMZARIC 28-10 MG CP24 Take 1 capsule by mouth each night  TO REPLACE DONEPEZIL  1 01/22/2017 at Unknown time  . Omega-3 Fatty Acids (FISH OIL PO) Take 1 capsule by mouth every morning.    01/23/2017 at Unknown time  . Rivaroxaban (XARELTO) 20 MG TABS tablet Take 20 mg by mouth at bedtime.  30 tablet  01/22/2017 at 1800  . rosuvastatin (CRESTOR) 5 MG tablet Take 2.5 mg by mouth at bedtime.   01/22/2017 at Unknown time  . vitamin C (ASCORBIC ACID) 500 MG tablet Take 500 mg by mouth every morning.    01/23/2017 at Unknown time  . cephALEXin (KEFLEX) 500 MG capsule Take 1 capsule (500 mg total) by mouth 3 (three) times daily. (Patient not taking: Reported on 01/23/2017) 21 capsule 0 Completed Course at Unknown time  . furosemide (LASIX) 20 MG tablet Take 1 tablet (20 mg total) by mouth as needed for  fluid or edema. (Patient not taking: Reported on 01/08/2017) 90 tablet 3 Not Taking at Unknown time  . potassium chloride (K-DUR) 10 MEQ tablet Take 1 tablet (10 mEq total) by mouth 2 (two) times daily. (Patient not taking: Reported on 01/23/2017) 14 tablet 0 Completed Course at Unknown time    Current medications: Current Facility-Administered Medications  Medication Dose Route Frequency Provider Last Rate Last Dose  . acetaminophen (TYLENOL) tablet 650 mg  650 mg Oral Q6H PRN Barton Dubois, MD      . cefTRIAXone (ROCEPHIN) 1 g in dextrose 5 % 50 mL IVPB  1 g Intravenous Q24H Barton Dubois, MD      . cholecalciferol (VITAMIN D) tablet 1,000 Units  1,000 Units Oral Daily Barton Dubois, MD   1,000 Units at 01/24/17 1214  . diazepam (VALIUM) tablet 2.5 mg  2.5 mg Oral Q12H PRN Barton Dubois, MD      . Derrill Memo ON 01/25/2017] digoxin (LANOXIN) tablet 0.125 mg  0.125 mg Oral Q M,W,F Barton Dubois, MD      . divalproex (DEPAKOTE) DR tablet 250 mg  250 mg Oral BID Barton Dubois, MD   250 mg at 01/24/17 1214  . memantine (NAMENDA XR) 24 hr  capsule 28 mg  28 mg Oral QHS Barton Dubois, MD   28 mg at 01/23/17 2217   And  . donepezil (ARICEPT) tablet 10 mg  10 mg Oral QHS Barton Dubois, MD   10 mg at 01/23/17 2220  . metoprolol tartrate (LOPRESSOR) tablet 12.5 mg  12.5 mg Oral BID Barton Dubois, MD   12.5 mg at 01/24/17 1213  . mirtazapine (REMERON) tablet 15 mg  15 mg Oral QHS Barton Dubois, MD   15 mg at 01/23/17 2218  . omega-3 acid ethyl esters (LOVAZA) capsule 1 g  1 g Oral Daily Barton Dubois, MD   1 g at 01/24/17 1213  . ondansetron (ZOFRAN) tablet 4 mg  4 mg Oral Q6H PRN Barton Dubois, MD       Or  . ondansetron [facility]) injection 4 mg  4 mg Intravenous Q6H PRN Barton Dubois, MD      . polyethylene glycol (MIRALAX / GLYCOLAX) packet 17 g  17 g Oral Daily Barton Dubois, MD   17 g at 01/24/17 1212  . rivaroxaban (XARELTO) tablet 20 mg  20 mg Oral QHS Barton Dubois, MD   20 mg at 01/23/17 2217  . rosuvastatin (CRESTOR) tablet 2.5 mg  2.5 mg Oral QHS Barton Dubois, MD   2.5 mg at 01/23/17 2218  . senna-docusate (Senokot-S) tablet 1 tablet  1 tablet Oral BID Barton Dubois, MD   1 tablet at 01/24/17 1212  . sodium chloride 0.9 % bolus 1,000 mL  1,000 mL Intravenous Once Gareth Morgan, MD      . sodium chloride flush (NS) 0.9 % injection 3 mL  3 mL Intravenous Q12H Barton Dubois, MD   3 mL at 01/24/17 1214  . vitamin C (ASCORBIC ACID) tablet 500 mg  500 mg Oral Daily Barton Dubois, MD   500 mg at 01/24/17 1213     Allergies  Allergen Reactions  . Sulfa Antibiotics Rash  . Zocor [Simvastatin] Other (See Comments)    Old allergy. Unsure reaction     Past Medical History:  Diagnosis Date  . Alzheimer's dementia 03/31/2014  . Anginal pain ([place])   . Anxiety   . Arthritis    "joints" (03/31/2014)  . Atrial fibrillation ([place])   . Blood in urine    "  chronic" (03/31/2014)  . CHF (congestive heart failure) ([place])   . Chronic shoulder pain   . Depression   . Dyslipidemia   . Dysrhythmia    atrail fib   . Heart palpitations      rare  . Humerus fracture 03/31/2014   "fell and broke left arm"  . Hx of transfusion of packed red blood cells   . Hyperlipidemia   . Hypertension   . Kidney stones   . Long-term (current) use of anticoagulants   . Memory problem    "a little short term and long term memory loss" (03/31/2014)  . Osteopenia    "severe" (03/31/2014)  . Seasonal allergies   . Syncope and collapse 03/31/2014    Past Surgical History:  Procedure Laterality Date  . APPENDECTOMY  ~ 1953  . BLADDER SUSPENSION     w/hysterectomy  . CARDIOVERSION  2006  . CYSTOSCOPY WITH RETROGRADE PYELOGRAM, URETEROSCOPY AND STENT PLACEMENT Bilateral 08/26/2014   Procedure: CYSTOSCOPY WITH RETROGRADE PYELOGRAM, URETEROSCOPY , EXTRACTION OF STONES AND STENT PLACEMENT;  Surgeon: Jorja Loa, MD;  Location: [facility];  Service: Urology;  Laterality: Bilateral;  . CYSTOSCOPY WITH STENT PLACEMENT Right 06/24/2015   Procedure: CYSTOSCOPY URETEROSCOPY, PYLEOGRAM, WITH STENT PLACEMENT X2 ;  Surgeon: Franchot Gallo, MD;  Location: [facility];  Service: Urology;  Laterality: Right;  . CYSTOSCOPY WITH URETEROSCOPY, STONE BASKETRY AND STENT PLACEMENT Bilateral 07/21/2015   Procedure: CYSTOSCOPY WITH  right URETEROSCOPY, STONE BASKETRY bilateral pylegram  removal bilateral double j stents;  Surgeon: Franchot Gallo, MD;  Location: [facility];  Service: Urology;  Laterality: Bilateral;  . CYSTOSCOPY/RETROGRADE/URETEROSCOPY/STONE EXTRACTION WITH BASKET Right 10/08/2014   Procedure: CYSTOSCOPY, retrograde pyelogram,URETEROSCOPY, laser treatment of calculus, stent exchange;  Surgeon: Jorja Loa, MD;  Location: [facility];  Service: Urology;  Laterality: Right;  . HOLMIUM LASER APPLICATION Left 12/24/971   Procedure: HOLMIUM LASER APPLICATION;  Surgeon: Jorja Loa, MD;  Location: [facility];  Service: Urology;  Laterality: Left;  . HOLMIUM LASER APPLICATION Right 03/22/2991   Procedure: HOLMIUM LASER APPLICATION;  Surgeon: Franchot Gallo, MD;  Location: [facility];  Service: Urology;  Laterality: Right;  . KNEE ARTHROSCOPY Right 2011  . MENISCUS REPAIR Left 07/2000  . NEPHROLITHOTOMY Right 06/04/2013   Procedure: NEPHROLITHOTOMY PERCUTANEOUS;  Surgeon: Franchot Gallo, MD;  Location: [facility];  Service: Urology;  Laterality: Right;  . removal of growth  2007   growth under kidney removed and possibly kindey stones removed  . TOTAL SHOULDER ARTHROPLASTY Right 10/2007  . URETERAL STENT PLACEMENT Bilateral    for stenosis; "they've been removed"    Family History  Problem Relation Age of Onset  . Heart disease Mother   . Heart attack Mother 39    Social History:  reports that she quit smoking about 35 years ago. Her smoking use included Cigarettes. She has a 1.25 pack-year smoking history. She has never used smokeless tobacco. She reports that she does not drink alcohol or use drugs.   Review of Systems: Constitutional:  denies fever, chills, diaphoresis, appetite change and fatigue.  HEENT: denies photophobia, eye pain, redness, hearing loss, ear pain, congestion, sore throat, rhinorrhea, sneezing, neck pain, neck stiffness and tinnitus.  Respiratory: denies SOB, DOE, cough, chest tightness, and wheezing.  Cardiovascular: denies chest pain, palpitations and leg swelling.  Gastrointestinal: denies nausea, vomiting, abdominal pain, diarrhea, constipation, blood in stool.  Genitourinary: denies dysuria, urgency, frequency, hematuria, flank pain and difficulty urinating.  Musculoskeletal: denies  myalgias, back pain, joint  swelling, arthralgias and gait problem.   Skin: denies pallor, rash and wound.  Neurological: denies dizziness, seizures, syncope, weakness, light-headedness, numbness and headaches.   Hematological: denies adenopathy, easy bruising, personal or family bleeding history.  Psychiatric/ Behavioral: denies suicidal ideation, mood changes, confusion, nervousness, sleep disturbance and agitation.     Physical Exam: BP (!) 122/59 (BP Location: Left Arm)   Pulse 68   Temp 98 F (36.7 C) (Oral)   Resp 18   Ht 5' 5.5" (1.664 m)   Wt 122 lb 9.2 oz (55.6 kg)   SpO2 96%   BMI 20.09 kg/m   Wt Readings from Last 3 Encounters:  01/24/17 122 lb 9.2 oz (55.6 kg)  01/08/17 132 lb (59.9 kg)  02/29/16 131 lb 9.6 oz (59.7 kg)    General: Vital signs reviewed and noted. Chronically ill appearing , elderly female  Head: Normocephalic, atraumatic, sclera anicteric,   Neck: Supple. Negative for carotid bruits. No JVD   Lungs:  Consolidation on the right side   Heart: Irreg. Irreg. 2-4/4 systolic murmur   Abdomen/ GI :  Soft, non-tender, non-distended with normoactive bowel sounds. No hepatomegaly. No rebound/guarding. No obvious abdominal masses   MSK: Strength and the appear normal for age.   Extremities: No clubbing or cyanosis. No edema.  Distal pedal pulses are 2+ and equal   Neurologic:  CN are grossly intact,  No obvious motor or sensory defect.  Alert and oriented X 3. Moves all extremities spontaneously.  Psych: Has some memory issues.  Daughter answered most questions     Lab results: Basic Metabolic Panel:  Recent Labs Lab 01/23/17 1519 01/23/17 2307 01/24/17 0600  NA 144  --  145  K 4.1  --  3.2*  CL 106  --  113*  CO2 30  --  24  GLUCOSE 202*  --  90  BUN 19  --  15  CREATININE 0.93  --  0.68  CALCIUM 9.6  --  9.0  MG 1.8 1.7  --   PHOS  --  2.7  --     Liver Function Tests:  Recent Labs Lab 01/23/17 1519  AST 25  ALT <5*  ALKPHOS 68  BILITOT 1.4*  PROT 6.4*  ALBUMIN 3.0*   No results for input(s): LIPASE, AMYLASE in the last 168 hours. No results for input(s): AMMONIA in the last 168 hours.  CBC:  Recent Labs Lab 01/23/17 1519 01/24/17 0600  WBC 9.0 8.5  NEUTROABS 7.7  --   HGB 12.5 10.7*  HCT 39.5 32.7*  MCV 95.4 94.5  PLT 202 171    Cardiac Enzymes: No results for input(s): CKTOTAL, CKMB, CKMBINDEX, TROPONINI in the last 168  hours.  BNP: Invalid input(s): POCBNP  CBG: No results for input(s): GLUCAP in the last 168 hours.  Coagulation Studies: No results for input(s): LABPROT, INR in the last 72 hours.   Other results: Personal review of EKG shows :  -  Atrial fib with controlled V response   Imaging: Dg Chest 2 View  Result Date: 01/23/2017 CLINICAL DATA:  Syncope x2 today. EXAM: CHEST  2 VIEW COMPARISON:  Single-view of the chest 01/08/2017. PA and lateral chest 06/24/2015. FINDINGS: The lungs are clear. There is cardiomegaly. No pneumothorax or pleural effusion. Aortic atherosclerosis is seen. No acute bony abnormality. The patient is status post right shoulder replacement. IMPRESSION: Cardiomegaly without acute disease. Electronically Signed   By: Inge Rise M.D.   On: 01/23/2017 17:06   Ct  Renal Stone Study  Result Date: 01/23/2017 CLINICAL DATA:  84 year old female with history of nausea and syncope. Recurrent urinary tract infections. EXAM: CT ABDOMEN AND PELVIS WITHOUT CONTRAST TECHNIQUE: Multidetector CT imaging of the abdomen and pelvis was performed following the standard protocol without IV contrast. COMPARISON:  CT the abdomen and pelvis 07/12/2014. FINDINGS: Lower chest: Scarring in the left lower lobe. Mild cardiomegaly. Atherosclerotic calcifications in the left anterior descending, left circumflex and right coronary arteries. Hepatobiliary: Calcified granuloma in the liver again noted. Several small well-defined low-attenuation lesions are again noted scattered throughout the liver, incompletely characterized on today's noncontrast CT examination, but similar to the prior study, presumably small cysts, largest of which measures up to 1.8 cm in segment 6. Gallbladder is normal in appearance. Pancreas: Extending off the inferior aspect of the body of the pancreas there is a well-circumscribed 4.3 x 2.4 x 3.6 cm low-attenuation lesion (axial image 27 of series 2 and sagittal image 113 of series 4)  which is new compared to the prior examination, and incompletely characterized on today's noncontrast CT examination, but favored to represent a pancreatic pseudocyst (this measures only -27 HU, suggesting that it contains fluid with some saponified fat). Spleen: Unremarkable. Adrenals/Urinary Tract: 11 mm calculus in the proximal third of the left upper pole ureter (left renal collecting system and proximal ureters are duplicated) shortly beyond the left ureteropelvic junction. This is associated with mild proximal left hydroureteronephrosis involving the upper pole moiety. No additional calculi are noted within the right renal collecting system, elsewhere along the course of either ureter, or within the lumen of the urinary bladder. Right kidney and bilateral adrenal glands are normal in appearance. Left kidney is partially distorted by a mass. It is uncertain whether not this mass arises from the left kidney or is adjacent to the left kidney. Previous studies have demonstrated a partially calcified soft tissue mass in this region, however, this mass appears anteriorly displaced by a new larger intermediate attenuation mass on today's examination. Specifically, the new mass is favored to arise from the lower pole of the left kidney and measures 7.2 x 9.4 x 9.9 cm (axial image 43 of series 2 and coronal image 58 of series 3) and measures intermediate attenuation (40 HU). There is also a large low-attenuation perinephric fluid collection posterior to the left kidney which measures 4.6 x 0.3 x 6.7 cm (axial image 39 of series 2 and coronal image 78 of series 3). Unenhanced appearance of the urinary bladder is normal. Stomach/Bowel: Appearance of the stomach is normal. There is no pathologic dilatation of small bowel or colon. Normal appendix. Vascular/Lymphatic: Aortic atherosclerosis, without definite aneurysm in the abdominal or pelvic vasculature. No definite lymphadenopathy noted in the abdomen or pelvis on  today's noncontrast CT examination. Reproductive: Status post hysterectomy. Ovaries are not confidently identified may be surgically absent or atrophic. Other: Previously described soft tissue mass in the left side of the abdomen is similar in size to prior examinations, currently measuring 2.9 x 5.5 x 4.7 cm (axial image 43 of series 2 and coronal image 39 of series 3), but has been displaced anteriorly by the large left-sided renal or perirenal mass discussed above. Musculoskeletal: Multiple chronic sclerotic lesions are again noted throughout the pelvis, similar to prior examinations, presumably benign. No other new aggressive appearing lytic or blastic lesions are noted in the visualized portions of the skeleton. IMPRESSION: 1. 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 2. Large mass in the left side of the retroperitoneum which appears intimately associated with the lower pole of the left kidney and is favored to represent a primary renal neoplasm such as a renal cell carcinoma. This may alternatively represent a large hemorrhage, however, that is not favored. This could be further delineated with MRI of the abdomen with and without IV gadolinium if clinically appropriate. 3. New low-attenuation (-27 HU) cystic appearing lesion extending off the inferior aspect of the mid body of the pancreas, favored to represent a pancreatic pseudocyst. This too could be better evaluated with followup MRI of the abdomen with and without IV gadolinium. 4. Large left-sided low-attenuation perinephric fluid collection could represent a urinoma in the setting of an obstructed upper pole collecting system. 5. Aortic atherosclerosis, in addition to at least 3 vessel coronary artery disease. 6. Cardiomegaly. 7.  Additional incidental findings, as above. Electronically Signed   By: Vinnie Langton M.D.   On: 01/23/2017 19:41        Assessment & Plan:  1. Syncope: I suspect this was due to dehydration - from recent UTI and fecal impaction. Her PO intake has been poor. She is on a low dose Metoprolol to control her AF rate  ( the dose was increased from QD to BID on admission - I will decrease it back to daily dosing )   Continue to monitor . No additional testing indicated at this point   2. Atrial fib :  HR is very well controlled on metoprolol 12. 5 a day .  Also on Xarelto 20 mg a day .  Continue for now. There is a possiblity that she may need a urology procedure. She may hold her Xarelto for 2 days prior to procedure and she is at low risk for the procedure .  3. Dementia:  4. Hyperlipidemia:  Continue atorvastatin    No new cardiology recs. Will sign off.  Call for questions   Ramond Dial., MD, [facility] 01/24/2017, 2:06 PM Office - [phone] Pager [phone]

## 2017-01-24 NOTE — Progress Notes (Signed)
CRITICAL VALUE ALERT  Critical value received: Lactic acid 2.7  Date of notification:  01/24/17  Time of notification:  5465  Critical value read back:Yes.    Nurse who received alert:  S. Shariq Puig,RN   MD notified (1st page):  Maryjane Hurter  Time of first page:  0303  MD notified (2nd page):  Time of second page:  Responding MD:  Maryjane Hurter  Time MD responded:  [phone]

## 2017-01-24 NOTE — Progress Notes (Signed)
CRITICAL VALUE ALERT  Critical value received:  Lactic acid 2.3  Date of notification:  01/24/17  Time of notification:  0645  Critical value read back:Yes.    Nurse who received alert:  S.Kyl Givler,RN  MD notified (1st page):  Raliegh Ip. Schorr,NP  Time of first page:    MD notified (2nd page):  Time of second page:  Responding MD:    Time MD responded:

## 2017-01-24 NOTE — Progress Notes (Signed)
PT Cancellation Note  Patient Details Name: Penny [name] MRN: [id number] DOB: 1929-07-22   Cancelled Treatment:    Reason Eval/Treat Not Completed: Fatigue/lethargy limiting ability to participate Pt sleeping upon entering room.  Family present and reports she did not sleep much last night and request to allow her to rest at this time.  Family very agreeable to PT evaluation, request PT check back.   Alyssha Housh,KATHrine E 01/24/2017, 10:15 AM Carmelia Bake, PT, DPT 01/24/2017 Pager: [phone]

## 2017-01-24 NOTE — Progress Notes (Signed)
[facility] PROGRESS NOTE  Penny [name] TML:[id number] DOB: 1929/09/10 DOA: 01/23/2017 PCP: Marton Redwood, MD  Interim summary and HPI 83 y/o female with PMH significant for dementia, recurrent UTI's, A. Fib (on xarelto), HTN, HLD and depression; who presented to ED after experiencing 2 short episodes of syncope on the day of admission. Per daughter at bedside patient had 2 episodes of losing consciousness while sitting at her porch. Patient's daughter endorses her mother mentioned been lightheaded and nauseated. Is important to mentioned that just 2 weeks ago she was diagnosed with UTI, and has not complete return to her baseline, regarding eating, drinking or been herself physically. Patient is incontinent at baseline and has been experiencing some difficulty with constipation lately. Per daughter she had hx of hemorrhoids and has seen some BRBPR intermittently; also noticed her urine to be darker than usual. Patient denies dysuria, CP, fever, vomiting, HA's, no focal deficit, or any other complaints reported.  Assessment/Plan: 1-syncope: most likely associated with dehydration from UTI infection/poor PO intake; also with high risk for arrhythmias Vs vasovagal events, as patient appears to have fecal impaction. -no abnormalities seen on telemetry  -continue tx for UTI -continue IVF's -no fecal impaction seen on CT scan now; but patient started on bowel regimen  -follow cultures results -assess orthostatic changes -TSH (slightly elevated, Mg and Phosphorus levels WNL -positive CT for obstructive uropathy; nephrology consulted -provide supportive care and follow clinical response   2-chronic Atrial fibrillation (HCC) -rate controlled and stable overall -will continue low dose metoprolol  -digoxin level 0.3; will continue current digoxin dose. -on heparin now; in case urology needs to perform any intervention  -will monitor on telemetry for now -appreciated cardiology recommendations  and inputs   3-UTI (urinary tract infection), with obstructive uropathy: with big difficulties quantifying symptoms given dementia and baseline incontinence  -will continue empiric treatment with rocephin -follow urine cultures and blood cx's -continue gentle IVF's -provide supportive care and follow clinical response  -patient with abnormalities seen on CT scan suggesting obstructive uropathy by nephrolithiasis; MRI ordered for better assessment (especially given concerns for renal cell carcinoma). -Urology consulted.  4-lactic acidosis -most likely associated with dehydration and infection -will provide IVF's and follow level -patient w/o fever, no elevation of WBC's and no tachycardia currently  5-Hypertension: -continue metoprolol -BP is stable and well controlled  6-Diastolic heart failure ([place]): chronic and compensated  -will follow daily weights -heart healthy diet -strict intake and output   7-HLD -will continue statins and fish oil  8-depression and anxiety  -continue depakote and PRN valium    Code Status:DNR Family Communication: daughters at bedside  Disposition Plan: to be determined. Remains inpatient for now. Continue antibiotics for UTI with obstructive uropathy, follow images studies and urology rec's. Continue IVF's.   Consultants:  Cardiology  Urology   Procedures:  See below for x-ray reports   Antibiotics:  Rocephin 01/23/17  HPI/Subjective: Afebrile currently. Not eating much, complaining of left flank discomfort on palpation. No nausea, no vomiting. Patient with stable and rate controlled A. Fib on telemetry.  Objective: Vitals:   01/23/17 2025 01/24/17 0621  BP: (!) 151/86 (!) 122/59  Pulse: 83 68  Resp: 20 18  Temp: 97.4 F (36.3 C) 98 F (36.7 C)    Intake/Output Summary (Last 24 hours) at 01/24/17 1444 Last data filed at 01/24/17 1100  Gross per 24 hour  Intake          1448.75 ml  Output  60 ml  Net           1388.75 ml   Filed Weights   01/23/17 2025 01/24/17 0621  Weight: 53.1 kg (117 lb 1 oz) 55.6 kg (122 lb 9.2 oz)    Exam:   General:  Afebrile, no CP, no SOB. Patient not talking much and per family with very poor intake. Continue complaining of left flank discomfort on palpation.  Cardiovascular: irregular, no rubs or gallops, soft murmur, no JVD  Respiratory: good air movement, no wheezing, no crackles  Abdomen: soft, no guarding, positive left flank/LLQ pain on palpation, positive Bs  Musculoskeletal: no edema, no cyanosis   Data Reviewed: Basic Metabolic Panel:  Recent Labs Lab 01/23/17 1519 01/23/17 2307 01/24/17 0600  NA 144  --  145  K 4.1  --  3.2*  CL 106  --  113*  CO2 30  --  24  GLUCOSE 202*  --  90  BUN 19  --  15  CREATININE 0.93  --  0.68  CALCIUM 9.6  --  9.0  MG 1.8 1.7  --   PHOS  --  2.7  --    Liver Function Tests:  Recent Labs Lab 01/23/17 1519  AST 25  ALT <5*  ALKPHOS 68  BILITOT 1.4*  PROT 6.4*  ALBUMIN 3.0*   CBC:  Recent Labs Lab 01/23/17 1519 01/24/17 0600  WBC 9.0 8.5  NEUTROABS 7.7  --   HGB 12.5 10.7*  HCT 39.5 32.7*  MCV 95.4 94.5  PLT 202 171   Studies: Dg Chest 2 View  Result Date: 01/23/2017 CLINICAL DATA:  Syncope x2 today. EXAM: CHEST  2 VIEW COMPARISON:  Single-view of the chest 01/08/2017. PA and lateral chest 06/24/2015. FINDINGS: The lungs are clear. There is cardiomegaly. No pneumothorax or pleural effusion. Aortic atherosclerosis is seen. No acute bony abnormality. The patient is status post right shoulder replacement. IMPRESSION: Cardiomegaly without acute disease. Electronically Signed   By: Inge Rise M.D.   On: 01/23/2017 17:06   Ct Renal Stone Study  Result Date: 01/23/2017 CLINICAL DATA:  83 year old female with history of nausea and syncope. Recurrent urinary tract infections. EXAM: CT ABDOMEN AND PELVIS WITHOUT CONTRAST TECHNIQUE: Multidetector CT imaging of the abdomen and pelvis was  performed following the standard protocol without IV contrast. COMPARISON:  CT the abdomen and pelvis 07/12/2014. FINDINGS: Lower chest: Scarring in the left lower lobe. Mild cardiomegaly. Atherosclerotic calcifications in the left anterior descending, left circumflex and right coronary arteries. Hepatobiliary: Calcified granuloma in the liver again noted. Several small well-defined low-attenuation lesions are again noted scattered throughout the liver, incompletely characterized on today's noncontrast CT examination, but similar to the prior study, presumably small cysts, largest of which measures up to 1.8 cm in segment 6. Gallbladder is normal in appearance. Pancreas: Extending off the inferior aspect of the body of the pancreas there is a well-circumscribed 4.3 x 2.4 x 3.6 cm low-attenuation lesion (axial image 27 of series 2 and sagittal image 113 of series 4) which is new compared to the prior examination, and incompletely characterized on today's noncontrast CT examination, but favored to represent a pancreatic pseudocyst (this measures only -27 HU, suggesting that it contains fluid with some saponified fat). Spleen: Unremarkable. Adrenals/Urinary Tract: 11 mm calculus in the proximal third of the left upper pole ureter (left renal collecting system and proximal ureters are duplicated) shortly beyond the left ureteropelvic junction. This is associated with mild proximal left hydroureteronephrosis involving the upper  pole moiety. No additional calculi are noted within the right renal collecting system, elsewhere along the course of either ureter, or within the lumen of the urinary bladder. Right kidney and bilateral adrenal glands are normal in appearance. Left kidney is partially distorted by a mass. It is uncertain whether not this mass arises from the left kidney or is adjacent to the left kidney. Previous studies have demonstrated a partially calcified soft tissue mass in this region, however, this mass  appears anteriorly displaced by a new larger intermediate attenuation mass on today's examination. Specifically, the new mass is favored to arise from the lower pole of the left kidney and measures 7.2 x 9.4 x 9.9 cm (axial image 43 of series 2 and coronal image 58 of series 3) and measures intermediate attenuation (40 HU). There is also a large low-attenuation perinephric fluid collection posterior to the left kidney which measures 4.6 x 0.3 x 6.7 cm (axial image 39 of series 2 and coronal image 78 of series 3). Unenhanced appearance of the urinary bladder is normal. Stomach/Bowel: Appearance of the stomach is normal. There is no pathologic dilatation of small bowel or colon. Normal appendix. Vascular/Lymphatic: Aortic atherosclerosis, without definite aneurysm in the abdominal or pelvic vasculature. No definite lymphadenopathy noted in the abdomen or pelvis on today's noncontrast CT examination. Reproductive: Status post hysterectomy. Ovaries are not confidently identified may be surgically absent or atrophic. Other: Previously described soft tissue mass in the left side of the abdomen is similar in size to prior examinations, currently measuring 2.9 x 5.5 x 4.7 cm (axial image 43 of series 2 and coronal image 39 of series 3), but has been displaced anteriorly by the large left-sided renal or perirenal mass discussed above. Musculoskeletal: Multiple chronic sclerotic lesions are again noted throughout the pelvis, similar to prior examinations, presumably benign. No other new aggressive appearing lytic or blastic lesions are noted in the visualized portions of the skeleton. IMPRESSION: 1. 11 mm calculus in the proximal third of the left ureter from the upper pole moiety of the patient's left kidney (duplicated left renal collecting system and proximal ureters), with mild proximal left hydroureteronephrosis indicative of at least partial obstruction. 2. Large mass in the left side of the retroperitoneum which  appears intimately associated with the lower pole of the left kidney and is favored to represent a primary renal neoplasm such as a renal cell carcinoma. This may alternatively represent a large hemorrhage, however, that is not favored. This could be further delineated with MRI of the abdomen with and without IV gadolinium if clinically appropriate. 3. New low-attenuation (-27 HU) cystic appearing lesion extending off the inferior aspect of the mid body of the pancreas, favored to represent a pancreatic pseudocyst. This too could be better evaluated with followup MRI of the abdomen with and without IV gadolinium. 4. Large left-sided low-attenuation perinephric fluid collection could represent a urinoma in the setting of an obstructed upper pole collecting system. 5. Aortic atherosclerosis, in addition to at least 3 vessel coronary artery disease. 6. Cardiomegaly. 7. Additional incidental findings, as above. Electronically Signed   By: Vinnie Langton M.D.   On: 01/23/2017 19:41    Scheduled Meds: . cefTRIAXone (ROCEPHIN)  IV  1 g Intravenous Q24H  . cholecalciferol  1,000 Units Oral Daily  . [START ON 01/25/2017] digoxin  0.125 mg Oral Q M,W,F  . divalproex  250 mg Oral BID  . memantine  28 mg Oral QHS   And  . donepezil  10  mg Oral QHS  . [START ON 01/25/2017] metoprolol tartrate  12.5 mg Oral Daily  . mirtazapine  15 mg Oral QHS  . omega-3 acid ethyl esters  1 g Oral Daily  . polyethylene glycol  17 g Oral Daily  . potassium chloride  40 mEq Oral Q4H  . rosuvastatin  2.5 mg Oral QHS  . senna-docusate  1 tablet Oral BID  . sodium chloride  1,000 mL Intravenous Once  . sodium chloride flush  3 mL Intravenous Q12H  . vitamin C  500 mg Oral Daily   Continuous Infusions:  Active Problems:   Atrial fibrillation (HCC)   UTI (urinary tract infection)   Syncope   Hypertension   Lactic acidosis   Diastolic heart failure ([place])    Time spent: 25 minutes    Barton Dubois  [facility] Pager [phone]. If 7PM-7AM, please contact night-coverage at [email or web address], password [facility] 01/24/2017, 2:44 PM  LOS: 1 day

## 2017-01-24 NOTE — Progress Notes (Signed)
ANTICOAGULATION CONSULT NOTE - Initial Consult  Pharmacy Consult for IV heparin Indication: atrial fibrillation  Allergies  Allergen Reactions  . Sulfa Antibiotics Rash  . Zocor [Simvastatin] Other (See Comments)    Old allergy. Unsure reaction    Patient Measurements: Height: 5' 5.5" (166.4 cm) Weight: 122 lb 9.2 oz (55.6 kg) IBW/kg (Calculated) : 58.15 Heparin Dosing Weight:   Vital Signs: Temp: 98.2 F (36.8 C) (03/08 1500) Temp Source: Oral (03/08 1500) BP: 129/75 (03/08 1500) Pulse Rate: 84 (03/08 1500)  Labs:  Recent Labs  01/23/17 1519 01/24/17 0600 01/24/17 1540  HGB 12.5 10.7*  --   HCT 39.5 32.7*  --   PLT 202 171  --   APTT  --   --  44*  LABPROT  --   --  22.0*  INR  --   --  1.90  CREATININE 0.93 0.68  --     Estimated Creatinine Clearance: 44.3 mL/min (by C-G formula based on SCr of 0.68 mg/dL).   Medical History: Past Medical History:  Diagnosis Date  . Alzheimer's dementia 03/31/2014  . Anginal pain ([place])   . Anxiety   . Arthritis    "joints" (03/31/2014)  . Atrial fibrillation ([place])   . Blood in urine    "chronic" (03/31/2014)  . CHF (congestive heart failure) ([place])   . Chronic shoulder pain   . Depression   . Dyslipidemia   . Dysrhythmia    atrail fib   . Heart palpitations    rare  . Humerus fracture 03/31/2014   "fell and broke left arm"  . Hx of transfusion of packed red blood cells   . Hyperlipidemia   . Hypertension   . Kidney stones   . Long-term (current) use of anticoagulants   . Memory problem    "a little short term and long term memory loss" (03/31/2014)  . Osteopenia    "severe" (03/31/2014)  . Seasonal allergies   . Syncope and collapse 03/31/2014    Medications:  Scheduled:  . cefTRIAXone (ROCEPHIN)  IV  1 g Intravenous Q24H  . cholecalciferol  1,000 Units Oral Daily  . [START ON 01/25/2017] digoxin  0.125 mg Oral Q M,W,F  . divalproex  250 mg Oral BID  . memantine  28 mg Oral QHS   And  . donepezil  10 mg  Oral QHS  . feeding supplement  1 Container Oral TID BM  . [START ON 01/25/2017] levofloxacin (LEVAQUIN) IV  500 mg Intravenous 60 min Pre-Op  . [START ON 01/25/2017] metoprolol tartrate  12.5 mg Oral Daily  . mirtazapine  15 mg Oral QHS  . omega-3 acid ethyl esters  1 g Oral Daily  . polyethylene glycol  17 g Oral Daily  . potassium chloride  40 mEq Oral Q4H  . rosuvastatin  2.5 mg Oral QHS  . senna-docusate  1 tablet Oral BID  . sodium chloride  1,000 mL Intravenous Once  . sodium chloride flush  3 mL Intravenous Q12H  . vitamin C  500 mg Oral Daily    Assessment: Pharmacy is consulted to start heparin in 83 yo female with PMH of atrial fibrillation. Pt was on Xarelto and the drug was then discontinued and pt was started on heparin due to schedule cystoscopy procedure on 3/8.   Pt last dose of Xarelto was on 3/7 at 2217. Therefore, will start heparin drip 24 hours after dose at 2200 on 3/8.  Will order with aPTT and HL due to recent  Xarelto dosing.  Today, 01/24/17  INR 1.90, likely elevated due to Xarelto dose  Hgb 10.7, plt 171  No bleeding issues charted   Goal of Therapy:  Heparin level 0.3-0.7 units/ml Monitor platelets by anticoagulation protocol: Yes   Plan:   No bolus  Start heparin at 800 units/hr  APTT, HL with AM labs  F/u plans for procedure in AM    Royetta Asal, PharmD, BCPS Pager [phone] 01/24/2017 8:55 PM

## 2017-01-25 ENCOUNTER — Inpatient Hospital Stay (HOSPITAL_COMMUNITY): Payer: Medicare Other

## 2017-01-25 ENCOUNTER — Inpatient Hospital Stay (HOSPITAL_COMMUNITY): Payer: Medicare Other | Admitting: Anesthesiology

## 2017-01-25 ENCOUNTER — Encounter (HOSPITAL_COMMUNITY): Payer: Self-pay | Admitting: Urology

## 2017-01-25 ENCOUNTER — Other Ambulatory Visit: Payer: Self-pay | Admitting: Urology

## 2017-01-25 ENCOUNTER — Encounter (HOSPITAL_COMMUNITY)
Admission: EM | Disposition: A | Discharge status: Skilled Nursing Facility | Payer: Self-pay | Source: Home / Self Care | Attending: Internal Medicine

## 2017-01-25 DIAGNOSIS — E43 Unspecified severe protein-calorie malnutrition: Secondary | ICD-10-CM | POA: Insufficient documentation

## 2017-01-25 HISTORY — PX: CYSTOSCOPY W/ URETERAL STENT PLACEMENT: SHX1429

## 2017-01-25 LAB — CBC
HCT: 32.3 % — ABNORMAL LOW (ref 36.0–46.0)
Hemoglobin: 10.4 g/dL — ABNORMAL LOW (ref 12.0–15.0)
MCH: 30.5 pg (ref 26.0–34.0)
MCHC: 32.2 g/dL (ref 30.0–36.0)
MCV: 94.7 fL (ref 78.0–100.0)
PLATELETS: 162 10*3/uL (ref 150–400)
RBC: 3.41 MIL/uL — AB (ref 3.87–5.11)
RDW: 13.9 % (ref 11.5–15.5)
WBC: 6.3 10*3/uL (ref 4.0–10.5)

## 2017-01-25 LAB — SURGICAL PCR SCREEN
MRSA, PCR: NEGATIVE
STAPHYLOCOCCUS AUREUS: NEGATIVE

## 2017-01-25 LAB — BASIC METABOLIC PANEL
ANION GAP: 5 (ref 5–15)
BUN: 18 mg/dL (ref 4–21)
BUN: 18 mg/dL (ref 6–20)
CO2: 25 mmol/L (ref 22–32)
CREATININE: 0.8 mg/dL (ref 0.5–1.1)
Calcium: 9.1 mg/dL (ref 8.9–10.3)
Chloride: 114 mmol/L — ABNORMAL HIGH (ref 101–111)
Creatinine, Ser: 0.79 mg/dL (ref 0.44–1.00)
GFR calc Af Amer: >60 mL/min (ref >60)
GLUCOSE: 81 mg/dL
Glucose, Bld: 81 mg/dL (ref 65–99)
POTASSIUM: 3.9 mmol/L (ref 3.4–5.3)
POTASSIUM: 3.9 mmol/L (ref 3.5–5.1)
SODIUM: 144 mmol/L (ref 135–145)
Sodium: 144 mmol/L (ref 137–147)

## 2017-01-25 LAB — LACTIC ACID, PLASMA: LACTIC ACID, VENOUS: 1.8 mmol/L (ref 0.5–1.9)

## 2017-01-25 LAB — APTT: APTT: 97 s — AB (ref 24–36)

## 2017-01-25 LAB — CBC AND DIFFERENTIAL
HCT: 32 % — AB (ref 36–46)
Hemoglobin: 10.4 g/dL — AB (ref 12.0–16.0)
PLATELETS: 162 10*3/uL (ref 150–399)
WBC: 6.3 10*3/mL

## 2017-01-25 LAB — HEPARIN LEVEL (UNFRACTIONATED): HEPARIN UNFRACTIONATED: 0.88 [IU]/mL — AB (ref 0.30–0.70)

## 2017-01-25 LAB — MAGNESIUM: Magnesium: 1.6 mg/dL — ABNORMAL LOW (ref 1.7–2.4)

## 2017-01-25 SURGERY — CYSTOSCOPY, WITH STENT INSERTION
Anesthesia: Choice | Laterality: Left

## 2017-01-25 SURGERY — CYSTOSCOPY, WITH RETROGRADE PYELOGRAM AND URETERAL STENT INSERTION
Anesthesia: General | Laterality: Left

## 2017-01-25 MED ORDER — SODIUM CHLORIDE 0.9 % IR SOLN
Status: DC | PRN
  Administered 2017-01-25: 3000 mL

## 2017-01-25 MED ORDER — LIDOCAINE HCL (CARDIAC) 20 MG/ML IV SOLN
INTRAVENOUS | Status: DC | PRN
  Administered 2017-01-25: 60 mg via INTRAVENOUS

## 2017-01-25 MED ORDER — PROPOFOL 10 MG/ML IV BOLUS
INTRAVENOUS | Status: DC | PRN
Start: 2017-01-25 — End: 2017-01-25
  Administered 2017-01-25: 90 mg via INTRAVENOUS

## 2017-01-25 MED ORDER — FENTANYL CITRATE (PF) 100 MCG/2ML IJ SOLN
INTRAMUSCULAR | Status: AC
  Filled 2017-01-25: qty 2

## 2017-01-25 MED ORDER — GADOBENATE DIMEGLUMINE 529 MG/ML IV SOLN
15.0000 mL | Freq: Once | INTRAVENOUS | Status: AC | PRN
  Administered 2017-01-25: 11 mL via INTRAVENOUS

## 2017-01-25 MED ORDER — FENTANYL CITRATE (PF) 100 MCG/2ML IJ SOLN: 25.0000 ug | INTRAMUSCULAR | Status: DC | PRN

## 2017-01-25 MED ORDER — PHENYLEPHRINE 40 MCG/ML (10ML) SYRINGE FOR IV PUSH (FOR BLOOD PRESSURE SUPPORT)
PREFILLED_SYRINGE | INTRAVENOUS | Status: AC
  Filled 2017-01-25: qty 10

## 2017-01-25 MED ORDER — PROMETHAZINE HCL 25 MG/ML IJ SOLN: 6.2500 mg | INTRAMUSCULAR | Status: DC | PRN

## 2017-01-25 MED ORDER — FENTANYL CITRATE (PF) 100 MCG/2ML IJ SOLN
INTRAMUSCULAR | Status: DC | PRN
  Administered 2017-01-25: 25 ug via INTRAVENOUS

## 2017-01-25 MED ORDER — PROPOFOL 10 MG/ML IV BOLUS
INTRAVENOUS | Status: AC
  Filled 2017-01-25: qty 20

## 2017-01-25 MED ORDER — LACTATED RINGERS IV SOLN
INTRAVENOUS | Status: DC
  Administered 2017-01-25: 12:00:00 via INTRAVENOUS

## 2017-01-25 MED ORDER — ONDANSETRON HCL 4 MG/2ML IJ SOLN
INTRAMUSCULAR | Status: AC
  Filled 2017-01-25: qty 2

## 2017-01-25 MED ORDER — ONDANSETRON HCL 4 MG/2ML IJ SOLN
INTRAMUSCULAR | Status: DC | PRN
  Administered 2017-01-25: 4 mg via INTRAVENOUS

## 2017-01-25 MED ORDER — LIDOCAINE 2% (20 MG/ML) 5 ML SYRINGE
INTRAMUSCULAR | Status: AC
Start: 2017-01-25 — End: 2017-01-25
  Filled 2017-01-25: qty 5

## 2017-01-25 MED ORDER — PHENYLEPHRINE HCL 10 MG/ML IJ SOLN
INTRAMUSCULAR | Status: DC | PRN
  Administered 2017-01-25: 40 ug via INTRAVENOUS
  Administered 2017-01-25: 80 ug via INTRAVENOUS

## 2017-01-25 SURGICAL SUPPLY — 14 items
BAG URO CATCHER STRL LF (MISCELLANEOUS) ×2 IMPLANT
BASKET ZERO TIP NITINOL 2.4FR (BASKET) IMPLANT
CATH INTERMIT  6FR 70CM (CATHETERS) IMPLANT
CLOTH BEACON ORANGE TIMEOUT ST (SAFETY) ×2 IMPLANT
GLOVE BIOGEL M STRL SZ7.5 (GLOVE) ×2 IMPLANT
GOWN STRL REUS W/TWL LRG LVL3 (GOWN DISPOSABLE) ×2 IMPLANT
GOWN STRL REUS W/TWL XL LVL3 (GOWN DISPOSABLE) ×2 IMPLANT
GUIDEWIRE ANG ZIPWIRE 035X150 (WIRE) ×2 IMPLANT
GUIDEWIRE ANG ZIPWIRE 038X150 (WIRE) IMPLANT
GUIDEWIRE STR DUAL SENSOR (WIRE) ×2 IMPLANT
MANIFOLD NEPTUNE II (INSTRUMENTS) ×2 IMPLANT
PACK CYSTO (CUSTOM PROCEDURE TRAY) ×2 IMPLANT
STENT URET 6FRX24 CONTOUR (STENTS) ×2 IMPLANT
TUBING CONNECTING 10 (TUBING) ×2 IMPLANT

## 2017-01-25 NOTE — Care Management Note (Signed)
Case Management Note  Patient Details  Name: Penny [name] MRN: [id number] Date of Birth: 1929/06/09  Subjective/Objective: Pt admitted with Syncope.                   Action/Plan: Plan to discharge home with family and private duty 24/7 care.   Expected Discharge Date:  01/26/2017               Expected Discharge Plan:  [place]  In-House Referral:  NA  Discharge planning Services  CM Consult  Post Acute Care Choice:  NA Choice offered to:  Adult Children  DME Arranged:    DME Agency:     HH Arranged:    HH Agency:     Status of Service:  In process, will continue to follow  If discussed at Long Length of Stay Meetings, dates discussed:    Additional CommentsPurcell Mouton, RN 01/25/2017, 4:54 PM

## 2017-01-25 NOTE — Care Management Important Message (Signed)
Important Message  Patient Details  Name: Penny [name] MRN: [id number] Date of Birth: 11/27/1928   Medicare Important Message Given:  Yes    Breigh Annett, RN 01/25/2017, 3:00 PM

## 2017-01-25 NOTE — Progress Notes (Signed)
Subjective: Patient looks comfortable. Already back from MRI   Objective: Vital signs in last 24 hours: Temp:  [97.7 F (36.5 C)-98.2 F (36.8 C)] 97.7 F (36.5 C) (03/09 0519) Pulse Rate:  [76-84] 76 (03/09 0519) Resp:  [18] 18 (03/09 0519) BP: (129-153)/(69-75) 136/69 (03/09 0519) SpO2:  [96 %-98 %] 97 % (03/09 0519) Weight:  [56.5 kg (124 lb 9 oz)] 56.5 kg (124 lb 9 oz) (03/09 0519)  Intake/Output from previous day: 03/08 0701 - 03/09 0700 In: 1613.2 [P.O.:440; I.V.:1173.2] Out: 0  Intake/Output this shift: No intake/output data recorded.  Physical Exam:  Constitutional: Vital signs reviewed. WD WN in NAD   Eyes: PERRL, No scleral icterus.   Pulmonary/Chest: Normal effort   Lab Results:  Recent Labs  01/23/17 1519 01/24/17 0600 01/25/17 0600  HGB 12.5 10.7* 10.4*  HCT 39.5 32.7* 32.3*   BMET  Recent Labs  01/24/17 0600 01/25/17 0600  NA 145 144  K 3.2* 3.9  CL 113* 114*  CO2 24 25  GLUCOSE 90 81  BUN 15 18  CREATININE 0.68 0.79  CALCIUM 9.0 9.1    Recent Labs  01/24/17 1540  INR 1.90   No results for input(s): LABURIN in the last 72 hours. Results for orders placed or performed during the hospital encounter of 01/23/17  Blood culture (routine x 2)     Status: None (Preliminary result)   Collection Time: 01/23/17  2:11 PM  Result Value Ref Range Status   Specimen Description BLOOD BLOOD RIGHT FOREARM  Final   Special Requests IN PEDIATRIC BOTTLE 2CC  Final   Culture   Final    NO GROWTH < 24 HOURS Performed at [facility], [place] [address] [place], [place] [address]    Report Status PENDING  Incomplete  Blood culture (routine x 2)     Status: None (Preliminary result)   Collection Time: 01/23/17  5:15 PM  Result Value Ref Range Status   Specimen Description BLOOD LEFT ANTECUBITAL  Final   Special Requests BOTTLES DRAWN AEROBIC AND ANAEROBIC 5CC  Final   Culture   Final    NO GROWTH < 24 HOURS Performed at [facility], [place] [address] [place], [place] [address]    Report Status PENDING  Incomplete    Studies/Results: Dg Chest 2 View  Result Date: 01/23/2017 CLINICAL DATA:  Syncope x2 today. EXAM: CHEST  2 VIEW COMPARISON:  Single-view of the chest 01/08/2017. PA and lateral chest 06/24/2015. FINDINGS: The lungs are clear. There is cardiomegaly. No pneumothorax or pleural effusion. Aortic atherosclerosis is seen. No acute bony abnormality. The patient is status post right shoulder replacement. IMPRESSION: Cardiomegaly without acute disease. Electronically Signed   By: Inge Rise M.D.   On: 01/23/2017 17:06   Ct Renal Stone Study  Result Date: 01/23/2017 CLINICAL DATA:  83 year old female with history of nausea and syncope. Recurrent urinary tract infections. EXAM: CT ABDOMEN AND PELVIS WITHOUT CONTRAST TECHNIQUE: Multidetector CT imaging of the abdomen and pelvis was performed following the standard protocol without IV contrast. COMPARISON:  CT the abdomen and pelvis 07/12/2014. FINDINGS: Lower chest: Scarring in the left lower lobe. Mild cardiomegaly. Atherosclerotic calcifications in the left anterior descending, left circumflex and right coronary arteries. Hepatobiliary: Calcified granuloma in the liver again noted. Several small well-defined low-attenuation lesions are again noted scattered throughout the liver, incompletely characterized on today's noncontrast CT examination, but similar to the prior study, presumably small cysts, largest of which measures up to  1.8 cm in segment 6. Gallbladder is normal in appearance. Pancreas: Extending off the inferior aspect of the body of the pancreas there is a well-circumscribed 4.3 x 2.4 x 3.6 cm low-attenuation lesion (axial image 27 of series 2 and sagittal image 113 of series 4) which is new compared to the prior examination, and incompletely characterized on today's noncontrast CT examination, but favored to represent a pancreatic pseudocyst (this measures only -27  HU, suggesting that it contains fluid with some saponified fat). Spleen: Unremarkable. Adrenals/Urinary Tract: 11 mm calculus in the proximal third of the left upper pole ureter (left renal collecting system and proximal ureters are duplicated) shortly beyond the left ureteropelvic junction. This is associated with mild proximal left hydroureteronephrosis involving the upper pole moiety. No additional calculi are noted within the right renal collecting system, elsewhere along the course of either ureter, or within the lumen of the urinary bladder. Right kidney and bilateral adrenal glands are normal in appearance. Left kidney is partially distorted by a mass. It is uncertain whether not this mass arises from the left kidney or is adjacent to the left kidney. Previous studies have demonstrated a partially calcified soft tissue mass in this region, however, this mass appears anteriorly displaced by a new larger intermediate attenuation mass on today's examination. Specifically, the new mass is favored to arise from the lower pole of the left kidney and measures 7.2 x 9.4 x 9.9 cm (axial image 43 of series 2 and coronal image 58 of series 3) and measures intermediate attenuation (40 HU). There is also a large low-attenuation perinephric fluid collection posterior to the left kidney which measures 4.6 x 0.3 x 6.7 cm (axial image 39 of series 2 and coronal image 78 of series 3). Unenhanced appearance of the urinary bladder is normal. Stomach/Bowel: Appearance of the stomach is normal. There is no pathologic dilatation of small bowel or colon. Normal appendix. Vascular/Lymphatic: Aortic atherosclerosis, without definite aneurysm in the abdominal or pelvic vasculature. No definite lymphadenopathy noted in the abdomen or pelvis on today's noncontrast CT examination. Reproductive: Status post hysterectomy. Ovaries are not confidently identified may be surgically absent or atrophic. Other: Previously described soft tissue  mass in the left side of the abdomen is similar in size to prior examinations, currently measuring 2.9 x 5.5 x 4.7 cm (axial image 43 of series 2 and coronal image 39 of series 3), but has been displaced anteriorly by the large left-sided renal or perirenal mass discussed above. Musculoskeletal: Multiple chronic sclerotic lesions are again noted throughout the pelvis, similar to prior examinations, presumably benign. No other new aggressive appearing lytic or blastic lesions are noted in the visualized portions of the skeleton. IMPRESSION: 1. 11 mm calculus in the proximal third of the left ureter from the upper pole moiety of the patient's left kidney (duplicated left renal collecting system and proximal ureters), with mild proximal left hydroureteronephrosis indicative of at least partial obstruction. 2. Large mass in the left side of the retroperitoneum which appears intimately associated with the lower pole of the left kidney and is favored to represent a primary renal neoplasm such as a renal cell carcinoma. This may alternatively represent a large hemorrhage, however, that is not favored. This could be further delineated with MRI of the abdomen with and without IV gadolinium if clinically appropriate. 3. New low-attenuation (-27 HU) cystic appearing lesion extending off the inferior aspect of the mid body of the pancreas, favored to represent a pancreatic pseudocyst. This too could be better  evaluated with followup MRI of the abdomen with and without IV gadolinium. 4. Large left-sided low-attenuation perinephric fluid collection could represent a urinoma in the setting of an obstructed upper pole collecting system. 5. Aortic atherosclerosis, in addition to at least 3 vessel coronary artery disease. 6. Cardiomegaly. 7. Additional incidental findings, as above. Electronically Signed   By: Vinnie Langton M.D.   On: 01/23/2017 19:41   First look @ MRI--loooks like a solid mass. Final reading  pending Assessment/Plan:   Obstructing left ureteral stone w/ UTI--for stenting today   LOS: 2 days   Franchot Gallo M 01/25/2017, 7:47 AM

## 2017-01-25 NOTE — Progress Notes (Signed)
[facility] PROGRESS NOTE  Penny [name] XNT:[id number] DOB: 03/23/1929 DOA: 01/23/2017 PCP: Marton Redwood, MD  Interim summary and HPI 83 y/o female with PMH significant for dementia, recurrent UTI'[name], A. Fib (on xarelto), HTN, HLD and depression; who presented to ED after experiencing 2 short episodes of syncope on the day of admission. Per daughter at bedside patient had 2 episodes of losing consciousness while sitting at her porch. Patient'[name] daughter endorses her mother mentioned been lightheaded and nauseated. Is important to mentioned that just 2 weeks ago she was diagnosed with UTI, and has not complete return to her baseline, regarding eating, drinking or been herself physically. Patient is incontinent at baseline and has been experiencing some difficulty with constipation lately. Per daughter she had hx of hemorrhoids and has seen some BRBPR intermittently; also noticed her urine to be darker than usual. Patient denies dysuria, CP, fever, vomiting, HA'[name], no focal deficit, or any other complaints reported.  Assessment/Plan: 1-syncope: most likely associated with dehydration from UTI infection/poor PO intake; also with high risk for arrhythmias Vs vasovagal events, as patient appears to have fecal impaction. -no abnormalities seen on telemetry  -continue tx for UTI -continue IVF'[name] -no fecal impaction seen on CT scan now; but patient started on bowel regimen  -will follow cultures results -TSH (slightly elevated), Mg and Phosphorus levels WNL -positive CT for obstructive uropathy; urology on board and [name]/p left ureter stenting  -provide supportive care and follow clinical response   2-chronic Atrial fibrillation (HCC) -rate controlled and stable overall -will continue low dose metoprolol  -digoxin level 0.3; will continue current digoxin dose. -on heparin now; will resume anticoagulation when ok by urology. Normally on xarelto -will monitor on telemetry  -appreciated cardiology  recommendations and inputs   3-UTI (urinary tract infection), with obstructive uropathy: with big difficulties quantifying symptoms given dementia and baseline incontinence  -will continue empiric treatment with rocephin -follow urine cultures and blood cx'[name] -continue gentle IVF'[name] -patient [name]/p left ureter stent placement and cystoscopy -[name]/P MRI confirming RCC on left kidney -will follow further urology rec'[name] -per MRI no metastasis appreciated.  4-lactic acidosis -most likely associated with dehydration and infection -resolved with IVF'[name]; lactic level today 1.8 -patient w/o fever, no elevation of WBC'[name] and no tachycardia currently  5-Hypertension: -continue metoprolol -BP is stable and well controlled  6-Diastolic heart failure ([place]): chronic and compensated  -will follow daily weights -heart healthy diet -strict intake and output   7-HLD -will continue statins and fish oil  8-depression and anxiety  -will continue depakote and PRN valium    Code Status:DNR Family Communication: daughters at bedside  Disposition Plan: to be determined. Remains inpatient for now. Continue antibiotics for UTI with obstructive uropathy, follow  urology rec'[name] Continue IVF'[name]  Consultants:  Cardiology  Urology   Procedures:  See below for x-ray reports   Antibiotics:  Rocephin 01/23/17  HPI/Subjective: Afebrile currently. [name]/P left ureteral stenting and cystoscopy; vague abd discomfort; hemodynamically stable. No CP.  Objective: Vitals:   01/25/17 1703 01/25/17 2141  BP: (!) 158/86 (!) 144/74  Pulse: 73 74  Resp:  16  Temp:  97.9 F (36.6 C)    Intake/Output Summary (Last 24 hours) at 01/25/17 2242 Last data filed at 01/25/17 1900  Gross per 24 hour  Intake          1545.25 ml  Output                0 ml  Net  1545.25 ml   Filed Weights   01/23/17 2025 01/24/17 0621 01/25/17 0519  Weight: 53.1 kg (117 lb 1 oz) 55.6 kg (122 lb 9.2 oz) 56.5 kg (124 lb 9 oz)     Exam:   General:  Afebrile, no CP, no SOB. Slightly somnolent from procedure but hemodynamically stable.  Cardiovascular: irregular, no rubs or gallops, soft murmur, no JVD  Respiratory: good air movement, no wheezing, no crackles  Abdomen: soft, no guarding, positive BS; vague abd discomfort on palpation.  Musculoskeletal: no edema, no cyanosis   Data Reviewed: Basic Metabolic Panel:  Recent Labs Lab 01/23/17 1519 01/23/17 2307 01/24/17 0600 01/25/17 0600  NA 144  --  145 144  K 4.1  --  3.2* 3.9  CL 106  --  113* 114*  CO2 30  --  24 25  GLUCOSE 202*  --  90 81  BUN 19  --  15 18  CREATININE 0.93  --  0.68 0.79  CALCIUM 9.6  --  9.0 9.1  MG 1.8 1.7  --  1.6*  PHOS  --  2.7  --   --    Liver Function Tests:  Recent Labs Lab 01/23/17 1519  AST 25  ALT <5*  ALKPHOS 68  BILITOT 1.4*  PROT 6.4*  ALBUMIN 3.0*   CBC:  Recent Labs Lab 01/23/17 1519 01/24/17 0600 01/25/17 0600  WBC 9.0 8.5 6.3  NEUTROABS 7.7  --   --   HGB 12.5 10.7* 10.4*  HCT 39.5 32.7* 32.3*  MCV 95.4 94.5 94.7  PLT 202 171 162   Studies: Mr Abdomen W Wo Contrast  Result Date: 01/25/2017 CLINICAL DATA:  Evaluate large left-sided retroperitoneal mass seen on recent CT scan. EXAM: MRI ABDOMEN WITHOUT AND WITH CONTRAST TECHNIQUE: Multiplanar multisequence MR imaging of the abdomen was performed both before and after the administration of intravenous contrast. CONTRAST:  90mL MULTIHANCE GADOBENATE DIMEGLUMINE 529 MG/ML IV SOLN COMPARISON:  CT scan 01/23/2017 and prior study from 07/12/2014 FINDINGS: Lower chest: Knee heart is mildly enlarged. No pericardial effusion. Persistent right basilar atelectasis and bibasilar scarring. No obvious pulmonary lesions. No pleural or pericardial effusion. Hepatobiliary: Numerous benign hepatic cysts several of which are septated. No worrisome hepatic lesions or intrahepatic biliary dilatation. The gallbladder is grossly normal. The no common bile duct  dilatation. Pancreas: No mass, inflammation or ductal dilatation. Mild atrophy. As demonstrated on the CT scan there is a cystic lesion located chest inferior to the body region of the pancreas. This measures 4.0 x 2.5 cm. No enhancement is demonstrated. This is likely pseudocyst or benign peritoneal inclusion cyst. Spleen:  Normal size.  No focal lesions. Adrenals/Urinary Tract:  The adrenal glands are unremarkable. The 8 right kidney is unremarkable and stable. Duplicated collecting system again demonstrated. There is a large mass with cystic and solid components involving the lower pole moiety of the duplicated left kidney. The solid component measures 10 x 9 cm and demonstrates extensive enhancement. The more posterior complex cystic component measures 8.4 x 8.4 cm and is septated and may contain some debris. The upper pole moiety of the left kidney demonstrates persistent hydronephrosis with an obstructing ureteral calculus. There are few small scattered left-sided retroperitoneal lymph nodes without overt adenopathy. The largest node measures 12 x 5.5 mm. Stomach/Bowel: Visualized portions within the abdomen are unremarkable. Duodenum diverticuli are noted. Vascular/Lymphatic: Scattered small mesenteric and retroperitoneal lymph nodes but no overt adenopathy. The left renal vein is patent. Other: No ascites or abdominal  wall hernia. No subcutaneous lesions. There is a stable solid mesenteric mass located just anterior to the left kidney. The this measures 5.2 x 2.8 cm and is unchanged since 2015. A contains a few calcifications and is relatively dark on T2. No contrast enhancement. Musculoskeletal: No significant bony findings. IMPRESSION: 1. Large cystic and solid mass lesion associated with the lower pole moiety of the left kidney most consistent with renal cell carcinoma. No overt adenopathy or left renal vein involvement. No findings to suggest metastatic disease. 2. Stable multiple hepatic cysts. 3.  Benign-appearing fluid collection just inferior to the pancreatic body, likely pseudocyst. 4. Stable benign left-sided mesenteric mass since 2015. No worrisome MR imaging features. 5. Stable hydronephrosis of the upper pole moiety of the left kidney with an obstructing ureteral calculus. Electronically Signed   By: Marijo Sanes M.D.   On: 01/25/2017 09:35    Scheduled Meds: . cefTRIAXone (ROCEPHIN)  IV  1 g Intravenous Q24H  . cholecalciferol  1,000 Units Oral Daily  . digoxin  0.125 mg Oral Q M,W,F  . divalproex  250 mg Oral BID  . memantine  28 mg Oral QHS   And  . donepezil  10 mg Oral QHS  . feeding supplement  1 Container Oral TID BM  . metoprolol tartrate  12.5 mg Oral Daily  . mirtazapine  15 mg Oral QHS  . omega-3 acid ethyl esters  1 g Oral Daily  . polyethylene glycol  17 g Oral Daily  . rosuvastatin  2.5 mg Oral QHS  . senna-docusate  1 tablet Oral BID  . sodium chloride  1,000 mL Intravenous Once  . sodium chloride flush  3 mL Intravenous Q12H  . vitamin C  500 mg Oral Daily   Continuous Infusions:  Active Problems:   Atrial fibrillation (HCC)   UTI (urinary tract infection)   Syncope   Hypertension   Lactic acidosis   Diastolic heart failure ([place])   Obstructive uropathy   Renal mass   Protein-calorie malnutrition, severe    Time spent: 25 minutes    Barton Dubois  [facility] Pager [phone]. If 7PM-7AM, please contact night-coverage at [email or web address], password [facility] 01/25/2017, 10:42 PM  LOS: 2 days

## 2017-01-25 NOTE — Progress Notes (Signed)
PT Cancellation Note  Patient Details Name: CHEYANE AYON MRN: [id number] DOB: [birth year]   Cancelled Treatment:    Reason Eval/Treat Not Completed: Patient at procedure or test/unavailable   Verna Hamon,KATHrine E 01/25/2017, 1:31 PM Carmelia Bake, PT, DPT 01/25/2017 Pager: [phone]

## 2017-01-25 NOTE — Op Note (Signed)
Preoperative diagnosis: Obstructing left upper ureteral stone, urinary tract infection  Postoperative diagnosis: Same  Procedure: Cystoscopy,left retrograde ureteral pyelogram with fluoroscopic interpretation, placement of 6 French by 24 cm contour double-J stent without tether  Surgeon: Tanaysia Bhardwaj  Anesthesia: General  Complications: None  Specimens: None  Indications: 83 year old female with significant cognitive dysfunction who recently presented with urinary tract infection.  She was found, on CT scan to have a large left upper ureteral stone and a partially duplicated collecting system.  Additionally, she has a large renal mass consistent with renal cell carcinoma. The patient presents at this time for urgent stenting of her left upper ureteral stone.  Description of procedure: The patient was properly marked in the holding area.  She has been on IV antibiotics.  She was taken to the operating room where general anesthetic was administered.  She was placed in the dorsolithotomy position.  Genitalia and perineum were prepped and draped.  Proper timeout was performed.  A 21 French panendoscope was advanced into her bladder. This revealed normal urothelium.  Urine was somewhat cloudy.  No tumors were seen.  It appeared at first that there were 2 ureteral orifices on the left.  However, there was a tunneled ureter with communication of the more proximal ureter with the urothelium, just proximal to the true ureteral orifice.  The ureteral orifice was cannulated with a 6 [place] open-ended catheter.  It was advanced approximately 3 cm up the ureter where gentle retrograde ureteropyelogram was performed with Omnipaque.  This revealed the upper pole ureter moiety with the filling defect consistent with the stone and proximal hydronephrosis.  The lower pole ureteral moiety never filled out.  The calyces in the upper pole system were somewhat blunted.  I negotiated a sensor-tip guidewire up the  open-ended catheter, and once past the filling defect/stone, it passed in the upper pole calyx.  The open-ended catheter was then removed.  I then placed a 24 cm x 6 French contour double-J stent with the tether removed over top of the guidewire using direct cystoscopic and fluoroscopic guidance.  Once the guidewire was removed, good proximal and distal curls were seen on the stent.  At this point, the bladder was drained and the scope removed.  The patient was awakened and then taken to the PACU in stable condition.  She tolerated the procedure well.

## 2017-01-25 NOTE — Anesthesia Preprocedure Evaluation (Addendum)
Anesthesia Evaluation  Patient identified by MRN, date of birth, ID band Patient awake    Reviewed: Allergy & Precautions, NPO status , Patient's Chart, lab work & pertinent test results, reviewed documented beta blocker date and time   Airway Mallampati: II  TM Distance: >3 FB Neck ROM: Full    Dental  (+) Teeth Intact, Dental Advisory Given   Pulmonary neg pulmonary ROS, former smoker,    Pulmonary exam normal breath sounds clear to auscultation       Cardiovascular hypertension, Pt. on home beta blockers +CHF  + dysrhythmias Atrial Fibrillation + Valvular Problems/Murmurs (severe TR by echo) MR  Rhythm:Regular Rate:Normal + Systolic murmurs Echo 06/8501: Study Conclusions  - Left ventricle: The cavity size was normal. Wall thickness was normal. Systolic function was normal. The estimated ejection fraction was in the range of 60% to 65%. Wall motion was normal; there were no regional wall motion abnormalities. The study is not technically sufficient to allow evaluation of LV diastolic function. - Aortic valve: Trileaflet. Sclerosis without stenosis. There was no regurgitation. - Mitral valve: Late systolic prolpase of the anterior mitral   leaflet with posteriorly directed regurgitation. There was   moderate regurgitation. - Right ventricle: The cavity size was moderately dilated. Systolic   function is reduced. - Tricuspid valve: Dilated tricuspid annulus at 4.6 cm. There was severe regurgitation. - Pulmonary arteries: PA peak pressure: 30 mm Hg (S). - Inferior vena cava: The vessel was normal in size. The   respirophasic diameter changes were in the normal range (>= 50%), consistent with normal central venous pressure.   Neuro/Psych PSYCHIATRIC DISORDERS Anxiety Depression negative neurological ROS  negative psych ROS   GI/Hepatic negative GI ROS, Neg liver ROS,   Endo/Other  negative endocrine ROS  Renal/GU negative  Renal ROSNephrolithiasis   negative genitourinary   Musculoskeletal negative musculoskeletal ROS (+) Arthritis ,   Abdominal   Peds negative pediatric ROS (+)  Hematology  (+) Blood dyscrasia (Xarelto), anemia ,   Anesthesia Other Findings Day of surgery medications reviewed with the patient.  Reproductive/Obstetrics negative OB ROS                            Anesthesia Physical Anesthesia Plan  ASA: III  Anesthesia Plan: General   Post-op Pain Management:    Induction: Intravenous  Airway Management Planned: LMA  Additional Equipment:   Intra-op Plan:   Post-operative Plan: Extubation in OR  Informed Consent: I have reviewed the patients History and Physical, chart, labs and discussed the procedure including the risks, benefits and alternatives for the proposed anesthesia with the patient or authorized representative who has indicated his/her understanding and acceptance.   Dental advisory given  Plan Discussed with: CRNA and Surgeon  Anesthesia Plan Comments: (Risks/benefits of general anesthesia discussed with patient including risk of damage to teeth, lips, gum, and tongue, nausea/vomiting, allergic reactions to medications, and the possibility of heart attack, stroke and death.  All patient questions answered.  Patient wishes to proceed.)       Anesthesia Quick Evaluation

## 2017-01-25 NOTE — Anesthesia Postprocedure Evaluation (Signed)
Anesthesia Post Note  Patient: ADONAI HELZER  Procedure(s) Performed: Procedure(s) (LRB): CYSTOSCOPY WITH LEFT  RETROGRADE PYELOGRAM/URETERAL STENT PLACEMENT (Left)  Patient location during evaluation: PACU Anesthesia Type: General Level of consciousness: awake and alert Pain management: pain level controlled Vital Signs Assessment: post-procedure vital signs reviewed and stable Respiratory status: spontaneous breathing, nonlabored ventilation, respiratory function stable and patient connected to nasal cannula oxygen Cardiovascular status: blood pressure returned to baseline and stable Postop Assessment: no signs of nausea or vomiting Anesthetic complications: no       Last Vitals:  Vitals:   01/25/17 1400 01/25/17 1409  BP: (!) 156/83 (!) 176/83  Pulse: 66 67  Resp: 12 15  Temp: 36.4 C 36.3 C    Last Pain:  Vitals:   01/25/17 1400  TempSrc:   PainSc: 0-No pain                 Laiyla Slagel S

## 2017-01-25 NOTE — Anesthesia Procedure Notes (Signed)
Procedure Name: LMA Insertion Date/Time: 01/25/2017 12:24 PM Performed by: Glory Buff Pre-anesthesia Checklist: Patient identified, Emergency Drugs available, Suction available and Patient being monitored Patient Re-evaluated:Patient Re-evaluated prior to inductionOxygen Delivery Method: Circle system utilized Preoxygenation: Pre-oxygenation with 100% oxygen Intubation Type: IV induction LMA: LMA with gastric port inserted LMA Size: 3.0 Number of attempts: 1 Placement Confirmation: positive ETCO2 Tube secured with: Tape Dental Injury: Teeth and Oropharynx as per pre-operative assessment

## 2017-01-25 NOTE — Progress Notes (Signed)
ANTICOAGULATION CONSULT NOTE - f/u Consult  Pharmacy Consult for IV heparin Indication: atrial fibrillation  Allergies  Allergen Reactions  . Sulfa Antibiotics Rash  . Zocor [Simvastatin] Other (See Comments)    Old allergy. Unsure reaction    Patient Measurements: Height: 5' 5.5" (166.4 cm) Weight: 124 lb 9 oz (56.5 kg) IBW/kg (Calculated) : 58.15 Heparin Dosing Weight:   Vital Signs: Temp: 97.7 F (36.5 C) (03/09 0519) Temp Source: Oral (03/09 0519) BP: 136/69 (03/09 0519) Pulse Rate: 76 (03/09 0519)  Labs:  Recent Labs  01/23/17 1519 01/24/17 0600 01/24/17 1540 01/25/17 0600  HGB 12.5 10.7*  --  10.4*  HCT 39.5 32.7*  --  32.3*  PLT 202 171  --  162  APTT  --   --  44* 97*  LABPROT  --   --  22.0*  --   INR  --   --  1.90  --   CREATININE 0.93 0.68  --  0.79    Estimated Creatinine Clearance: 45 mL/min (by C-G formula based on SCr of 0.79 mg/dL).   Medical History: Past Medical History:  Diagnosis Date  . Alzheimer's dementia 03/31/2014  . Anginal pain ([place])   . Anxiety   . Arthritis    "joints" (03/31/2014)  . Atrial fibrillation ([place])   . Blood in urine    "chronic" (03/31/2014)  . CHF (congestive heart failure) ([place])   . Chronic shoulder pain   . Depression   . Dyslipidemia   . Dysrhythmia    atrail fib   . Heart palpitations    rare  . Humerus fracture 03/31/2014   "fell and broke left arm"  . Hx of transfusion of packed red blood cells   . Hyperlipidemia   . Hypertension   . Kidney stones   . Long-term (current) use of anticoagulants   . Memory problem    "a little short term and long term memory loss" (03/31/2014)  . Osteopenia    "severe" (03/31/2014)  . Seasonal allergies   . Syncope and collapse 03/31/2014    Medications:  Scheduled:  . cefTRIAXone (ROCEPHIN)  IV  1 g Intravenous Q24H  . cholecalciferol  1,000 Units Oral Daily  . digoxin  0.125 mg Oral Q M,W,F  . divalproex  250 mg Oral BID  . memantine  28 mg Oral QHS   And   . donepezil  10 mg Oral QHS  . feeding supplement  1 Container Oral TID BM  . levofloxacin (LEVAQUIN) IV  500 mg Intravenous 60 min Pre-Op  . metoprolol tartrate  12.5 mg Oral Daily  . mirtazapine  15 mg Oral QHS  . omega-3 acid ethyl esters  1 g Oral Daily  . polyethylene glycol  17 g Oral Daily  . rosuvastatin  2.5 mg Oral QHS  . senna-docusate  1 tablet Oral BID  . sodium chloride  1,000 mL Intravenous Once  . sodium chloride flush  3 mL Intravenous Q12H  . vitamin C  500 mg Oral Daily    Assessment: Pharmacy is consulted to start heparin in 83 yo female with PMH of atrial fibrillation. Pt was on Xarelto and the drug was then discontinued and pt was started on heparin due to schedule cystoscopy procedure on 3/8.   Pt last dose of Xarelto was on 3/7 at 2217. Therefore, will start heparin drip 24 hours after dose at 2200 on 3/8.  Will order with aPTT and HL due to recent Xarelto dosing.   3/8  INR 1.90, likely elevated due to Xarelto dose  Hgb 10.7, plt 171  No bleeding issues charted Today, 3/9  Aptt=97 at goal, HL= pending, called RN> heparin drip stopped at 0600 for pending procedure.  Goal of Therapy:  Heparin level 0.3-0.7 units/ml Monitor platelets by anticoagulation protocol: Yes   Plan:   F/u heparin restart post procedure  Dorrene German 01/25/2017 6:58 AM

## 2017-01-25 NOTE — Transfer of Care (Signed)
Immediate Anesthesia Transfer of Care Note  Patient: Penny [name]  Procedure(s) Performed: Procedure(s): CYSTOSCOPY WITH LEFT  RETROGRADE PYELOGRAM/URETERAL STENT PLACEMENT (Left)  Patient Location: PACU  Anesthesia Type:General  Level of Consciousness: awake, alert  and oriented  Airway & Oxygen Therapy: Patient Spontanous Breathing and Patient connected to face mask oxygen  Post-op Assessment: Report given to RN and Post -op Vital signs reviewed and stable  Post vital signs: Reviewed and stable  Last Vitals:  Vitals:   01/25/17 0519 01/25/17 1033  BP: 136/69 (!) 144/76  Pulse: 76 63  Resp: 18   Temp: 36.5 C     Last Pain:  Vitals:   01/25/17 1112  TempSrc:   PainSc: 0-No pain      Patients Stated Pain Goal: 0 (11/57/26 2035)  Complications: No apparent anesthesia complications

## 2017-01-26 DIAGNOSIS — E43 Unspecified severe protein-calorie malnutrition: Secondary | ICD-10-CM

## 2017-01-26 LAB — URINE CULTURE

## 2017-01-26 MED ORDER — BISACODYL 10 MG RE SUPP
10.0000 mg | Freq: Once | RECTAL | Status: AC
  Administered 2017-01-26: 10 mg via RECTAL
  Filled 2017-01-26: qty 1

## 2017-01-26 MED ORDER — CEFUROXIME AXETIL 500 MG PO TABS
500.0000 mg | ORAL_TABLET | Freq: Two times a day (BID) | ORAL | Status: DC
  Administered 2017-01-26 – 2017-01-28 (×4): 500 mg via ORAL
  Filled 2017-01-26 (×5): qty 1

## 2017-01-26 MED ORDER — RIVAROXABAN 20 MG PO TABS
20.0000 mg | ORAL_TABLET | Freq: Every day | ORAL | Status: DC
  Administered 2017-01-26 – 2017-01-27 (×2): 20 mg via ORAL
  Filled 2017-01-26 (×2): qty 1

## 2017-01-26 NOTE — Consult Note (Signed)
Urology Consult Note   Requesting Attending Physician:  Barton Dubois, MD Service Providing Consult: Urology Consulting Attending: Diona Fanti, MD  Assessment:  Patient is a 83 y.o. female with history of dementia, recurrent UTI, nephrolithiasis, CHF, atrial fibrillation, hypertension, hyperlipidemia, depression admitted 3/7 for syncope in setting of known UTI.   Incontinent at baseline.  Currently on ceftriaxone for infected urine, cultures pending.   Has required multiple ureteroscopic procedures for kidney stones in the past.  CT scan showed 11 mm calculus in the proximal third of the left upper pole moiety ureter from a partially duplicated collecting system, mild hydronephrosis proximal to this, a large mass associated with the lower pole of the left kidney and a possible urinoma surrounding the left kidney.  Clearly palpable left flank mass on exam. S/p left ureteral stent placement on 01/25/17 with Dr. Diona Fanti. MRI shows mixed solid/cystic lesion emanating from left lower pole of kidney (10x9cm solid; 8x8cm cystic - enhancing), left RP lymph nodes largest 75mm. Clear lungs on CXR 3/7.   Interval: AFVSS. Spontaneously voiding/incontinent. Prelim 3/7 urine culture with >100k E Coli. On CTX. Cr 0.79. WBC 6.  Recommendations: 1. Reasonable to transition to PO antibiotics today, 3/7 urine culture pansensitive 2. Follow up 1-2 weeks with Dr. Diona Fanti for further management outpatient of left obstructing ureteral calculus with USE. Will have further conversation at that time regarding management of left renal mass which presents a complex situation with the patient's age, comorbidities, and overall health. Could consider left nephrectomy for palliative purposes. I've requested this visit. 3. No further workup of left renal mass as inpatient. Can consider CT chest as outpatient pending final plan regarding left renal mass. 4. OK from our standpoint to restart Xarelto 5. Please plan to treat with at  least 10-14 day course of total antibiotics 6. Would be suitable for discharge once on appropriate antibiotics  Thank you for this consult. Please contact the urology consult pager with any further questions/concerns. Sharmaine Base, MD Urology Surgical Resident  Subjective Sleeping comfortably in pain. Continues to have LLQ pain likely related to mass, no back pain. No n/v.   Objective   Vital signs in last 24 hours: BP (!) 167/74   Pulse 83   Temp 97.6 F (36.4 C) (Oral)   Resp 16   Ht 5' 5.5" (1.664 m)   Wt 58.5 kg (128 lb 15.5 oz)   SpO2 98%   BMI 21.14 kg/m   Intake/Output last 3 shifts: I/O last 3 completed shifts: In: 2130.3 [P.O.:740; I.V.:1340.3; IV Piggyback:50] Out: 0   Physical Exam General: NAD, A&O, resting HEENT: [place]/AT, EOMI, MMM Pulmonary: Normal work of breathing on RA Cardiovascular: Regular rate & rhythm, HDS, adequate peripheral perfusion Abdomen: soft, TTP in LLQ, clearly palpable left flank mass, nondistended, no suprapubic fullness or tenderness GU: incontinent into a pad, no CVA tenderness bilaterally DRE: deferred Extremities: warm and well perfused, no edema  Most Recent Labs: Lab Results  Component Value Date   WBC 6.3 01/25/2017   HGB 10.4 (L) 01/25/2017   HCT 32.3 (L) 01/25/2017   PLT 162 01/25/2017    Lab Results  Component Value Date   NA 144 01/25/2017   K 3.9 01/25/2017   CL 114 (H) 01/25/2017   CO2 25 01/25/2017   BUN 18 01/25/2017   CREATININE 0.79 01/25/2017   CALCIUM 9.1 01/25/2017   MG 1.6 (L) 01/25/2017   PHOS 2.7 01/23/2017    Lab Results  Component Value Date   ALKPHOS 68 01/23/2017  BILITOT 1.4 (H) 01/23/2017   BILIDIR 0.2 02/26/2011   PROT 6.4 (L) 01/23/2017   ALBUMIN 3.0 (L) 01/23/2017   ALT <5 (L) 01/23/2017   AST 25 01/23/2017    Lab Results  Component Value Date   INR 1.90 01/24/2017   APTT 97 (H) 01/25/2017     Urine Culture: Pansensitive E coli 01/23/17  IMAGING: Mr Abdomen W Wo  Contrast  Result Date: 01/25/2017 CLINICAL DATA:  Evaluate large left-sided retroperitoneal mass seen on recent CT scan. EXAM: MRI ABDOMEN WITHOUT AND WITH CONTRAST TECHNIQUE: Multiplanar multisequence MR imaging of the abdomen was performed both before and after the administration of intravenous contrast. CONTRAST:  22mL MULTIHANCE GADOBENATE DIMEGLUMINE 529 MG/ML IV SOLN COMPARISON:  CT scan 01/23/2017 and prior study from 07/12/2014 FINDINGS: Lower chest: Knee heart is mildly enlarged. No pericardial effusion. Persistent right basilar atelectasis and bibasilar scarring. No obvious pulmonary lesions. No pleural or pericardial effusion. Hepatobiliary: Numerous benign hepatic cysts several of which are septated. No worrisome hepatic lesions or intrahepatic biliary dilatation. The gallbladder is grossly normal. The no common bile duct dilatation. Pancreas: No mass, inflammation or ductal dilatation. Mild atrophy. As demonstrated on the CT scan there is a cystic lesion located chest inferior to the body region of the pancreas. This measures 4.0 x 2.5 cm. No enhancement is demonstrated. This is likely pseudocyst or benign peritoneal inclusion cyst. Spleen:  Normal size.  No focal lesions. Adrenals/Urinary Tract:  The adrenal glands are unremarkable. The 8 right kidney is unremarkable and stable. Duplicated collecting system again demonstrated. There is a large mass with cystic and solid components involving the lower pole moiety of the duplicated left kidney. The solid component measures 10 x 9 cm and demonstrates extensive enhancement. The more posterior complex cystic component measures 8.4 x 8.4 cm and is septated and may contain some debris. The upper pole moiety of the left kidney demonstrates persistent hydronephrosis with an obstructing ureteral calculus. There are few small scattered left-sided retroperitoneal lymph nodes without overt adenopathy. The largest node measures 12 x 5.5 mm. Stomach/Bowel:  Visualized portions within the abdomen are unremarkable. Duodenum diverticuli are noted. Vascular/Lymphatic: Scattered small mesenteric and retroperitoneal lymph nodes but no overt adenopathy. The left renal vein is patent. Other: No ascites or abdominal wall hernia. No subcutaneous lesions. There is a stable solid mesenteric mass located just anterior to the left kidney. The this measures 5.2 x 2.8 cm and is unchanged since 2015. A contains a few calcifications and is relatively dark on T2. No contrast enhancement. Musculoskeletal: No significant bony findings. IMPRESSION: 1. Large cystic and solid mass lesion associated with the lower pole moiety of the left kidney most consistent with renal cell carcinoma. No overt adenopathy or left renal vein involvement. No findings to suggest metastatic disease. 2. Stable multiple hepatic cysts. 3. Benign-appearing fluid collection just inferior to the pancreatic body, likely pseudocyst. 4. Stable benign left-sided mesenteric mass since 2015. No worrisome MR imaging features. 5. Stable hydronephrosis of the upper pole moiety of the left kidney with an obstructing ureteral calculus. Electronically Signed   By: Marijo Sanes M.D.   On: 01/25/2017 09:35

## 2017-01-26 NOTE — Progress Notes (Addendum)
[facility] PROGRESS NOTE  Penny [name] YOV:[id number] DOB: [birth year] DOA: 01/23/2017 PCP: Marton Redwood, MD  Interim summary and HPI 83 y/o female with PMH significant for dementia, recurrent UTI's, A. Fib (on xarelto), HTN, HLD and depression; who presented to ED after experiencing 2 short episodes of syncope on the day of admission. Per daughter at bedside patient had 2 episodes of losing consciousness while sitting at her porch. Patient's daughter endorses her mother mentioned been lightheaded and nauseated. Is important to mentioned that just 2 weeks ago she was diagnosed with UTI, and has not complete return to her baseline, regarding eating, drinking or been herself physically. Patient is incontinent at baseline and has been experiencing some difficulty with constipation lately. Per daughter she had hx of hemorrhoids and has seen some BRBPR intermittently; also noticed her urine to be darker than usual. Patient denies dysuria, CP, fever, vomiting, HA's, no focal deficit, or any other complaints reported.  Assessment/Plan: 1-syncope: most likely associated with dehydration from UTI infection/poor PO intake; also with high risk for arrhythmias Vs vasovagal events, as patient appears to have fecal impaction. -no abnormalities seen on telemetry.  -continue tx for UTI; now transitioning to PO and looking for a total of 14 days of treatment total. -no fecal impaction seen on CT scan now; but patient has not had a BM. Patient started on bowel regimen and will use suppository  -TSH (slightly elevated), Mg and Phosphorus levels WNL -positive CT for obstructive uropathy; urology on board and s/p left ureter stenting  -provide supportive care and follow clinical response   2-chronic Atrial fibrillation (HCC) -rate controlled and stable overall -will continue low dose metoprolol  -digoxin level 0.3; will continue current digoxin dose. -will resume xarelto -will continue monitoring on  telemetry  -appreciated cardiology recommendations and inputs   3-UTI (urinary tract infection), with obstructive uropathy: with big difficulties quantifying symptoms given dementia and baseline incontinence  -will transition to ceftin (base on culture results and sensitivity) -IVF's now stopped; patient to be encourage on increase PO intake. -patient s/p left ureter stent placement and cystoscopy -S/P MRI confirming RCC on left kidney -will follow further urology rec's -per MRI no metastasis appreciated.  4-lactic acidosis -most likely associated with dehydration and infection -resolved with IVF's; lactic level today 1.8 -patient w/o fever, no elevation of WBC's and no tachycardia currently  5-Hypertension: -continue metoprolol -BP is stable and well controlled  6-Diastolic heart failure ([place]):  -chronic and compensated  -will follow daily weights -heart healthy diet -strict intake and output   7-HLD -will continue statins and fish oil  8-depression and anxiety  -will continue depakote and PRN valium   9-severe protein-calorie nutrition -continue feeding supplements and encourage patient to eat and drink  Code Status:DNR Family Communication: daughters at bedside  Disposition Plan: per HHPT evaluation, planning discharge home with [facility] services. Patient has not move her bowels yet. Will work on bowel regimen. Will transition antibiotics to PO and resume xarelto. Most likely home in am if remains stable.  Consultants:  Cardiology  Urology   Procedures:  See below for x-ray reports   Left ureter stenting placement/cystoscopy on 01/25/17  Antibiotics:  Rocephin 01/23/17>>>01/26/17  Ceftin 01/26/17  HPI/Subjective: Afebrile currently. More interactive today; having breakfast, no nausea, no vomiting and denying abd pain.  Objective: Vitals:   01/26/17 0951 01/26/17 1421  BP: (!) 167/74 123/71  Pulse: 83 70  Resp:  18  Temp:  97.9 F (36.6 C)     Intake/Output  Summary (Last 24 hours) at 01/26/17 1521 Last data filed at 01/26/17 1002  Gross per 24 hour  Intake             1010 ml  Output                0 ml  Net             1010 ml   Filed Weights   01/24/17 0621 01/25/17 0519 01/26/17 0458  Weight: 55.6 kg (122 lb 9.2 oz) 56.5 kg (124 lb 9 oz) 58.5 kg (128 lb 15.5 oz)    Exam:   General:  Afebrile, no CP, no SOB. More interactive today and in no distress. no nausea, no vomiting. Denies abd pain to me while examining her.  Cardiovascular: irregular, no rubs or gallops, soft murmur, no JVD  Respiratory: good air movement, no wheezing, no crackles  Abdomen: soft, no guarding, positive BS  Musculoskeletal: no edema, no cyanosis   Data Reviewed: Basic Metabolic Panel:  Recent Labs Lab 01/23/17 1519 01/23/17 2307 01/24/17 0600 01/25/17 0600  NA 144  --  145 144  K 4.1  --  3.2* 3.9  CL 106  --  113* 114*  CO2 30  --  24 25  GLUCOSE 202*  --  90 81  BUN 19  --  15 18  CREATININE 0.93  --  0.68 0.79  CALCIUM 9.6  --  9.0 9.1  MG 1.8 1.7  --  1.6*  PHOS  --  2.7  --   --    Liver Function Tests:  Recent Labs Lab 01/23/17 1519  AST 25  ALT <5*  ALKPHOS 68  BILITOT 1.4*  PROT 6.4*  ALBUMIN 3.0*   CBC:  Recent Labs Lab 01/23/17 1519 01/24/17 0600 01/25/17 0600  WBC 9.0 8.5 6.3  NEUTROABS 7.7  --   --   HGB 12.5 10.7* 10.4*  HCT 39.5 32.7* 32.3*  MCV 95.4 94.5 94.7  PLT 202 171 162   Studies: Mr Abdomen W Wo Contrast  Result Date: 01/25/2017 CLINICAL DATA:  Evaluate large left-sided retroperitoneal mass seen on recent CT scan. EXAM: MRI ABDOMEN WITHOUT AND WITH CONTRAST TECHNIQUE: Multiplanar multisequence MR imaging of the abdomen was performed both before and after the administration of intravenous contrast. CONTRAST:  80mL MULTIHANCE GADOBENATE DIMEGLUMINE 529 MG/ML IV SOLN COMPARISON:  CT scan 01/23/2017 and prior study from 07/12/2014 FINDINGS: Lower chest: Knee heart is mildly enlarged.  No pericardial effusion. Persistent right basilar atelectasis and bibasilar scarring. No obvious pulmonary lesions. No pleural or pericardial effusion. Hepatobiliary: Numerous benign hepatic cysts several of which are septated. No worrisome hepatic lesions or intrahepatic biliary dilatation. The gallbladder is grossly normal. The no common bile duct dilatation. Pancreas: No mass, inflammation or ductal dilatation. Mild atrophy. As demonstrated on the CT scan there is a cystic lesion located chest inferior to the body region of the pancreas. This measures 4.0 x 2.5 cm. No enhancement is demonstrated. This is likely pseudocyst or benign peritoneal inclusion cyst. Spleen:  Normal size.  No focal lesions. Adrenals/Urinary Tract:  The adrenal glands are unremarkable. The 8 right kidney is unremarkable and stable. Duplicated collecting system again demonstrated. There is a large mass with cystic and solid components involving the lower pole moiety of the duplicated left kidney. The solid component measures 10 x 9 cm and demonstrates extensive enhancement. The more posterior complex cystic component measures 8.4 x 8.4 cm and is septated and may contain  some debris. The upper pole moiety of the left kidney demonstrates persistent hydronephrosis with an obstructing ureteral calculus. There are few small scattered left-sided retroperitoneal lymph nodes without overt adenopathy. The largest node measures 12 x 5.5 mm. Stomach/Bowel: Visualized portions within the abdomen are unremarkable. Duodenum diverticuli are noted. Vascular/Lymphatic: Scattered small mesenteric and retroperitoneal lymph nodes but no overt adenopathy. The left renal vein is patent. Other: No ascites or abdominal wall hernia. No subcutaneous lesions. There is a stable solid mesenteric mass located just anterior to the left kidney. The this measures 5.2 x 2.8 cm and is unchanged since 2015. A contains a few calcifications and is relatively dark on T2. No  contrast enhancement. Musculoskeletal: No significant bony findings. IMPRESSION: 1. Large cystic and solid mass lesion associated with the lower pole moiety of the left kidney most consistent with renal cell carcinoma. No overt adenopathy or left renal vein involvement. No findings to suggest metastatic disease. 2. Stable multiple hepatic cysts. 3. Benign-appearing fluid collection just inferior to the pancreatic body, likely pseudocyst. 4. Stable benign left-sided mesenteric mass since 2015. No worrisome MR imaging features. 5. Stable hydronephrosis of the upper pole moiety of the left kidney with an obstructing ureteral calculus. Electronically Signed   By: Marijo Sanes M.D.   On: 01/25/2017 09:35    Scheduled Meds: . bisacodyl  10 mg Rectal Once  . cefUROXime  500 mg Oral BID WC  . cholecalciferol  1,000 Units Oral Daily  . digoxin  0.125 mg Oral Q M,W,F  . divalproex  250 mg Oral BID  . memantine  28 mg Oral QHS   And  . donepezil  10 mg Oral QHS  . feeding supplement  1 Container Oral TID BM  . metoprolol tartrate  12.5 mg Oral Daily  . mirtazapine  15 mg Oral QHS  . omega-3 acid ethyl esters  1 g Oral Daily  . polyethylene glycol  17 g Oral Daily  . rivaroxaban  20 mg Oral Q supper  . rosuvastatin  2.5 mg Oral QHS  . senna-docusate  1 tablet Oral BID  . sodium chloride  1,000 mL Intravenous Once  . sodium chloride flush  3 mL Intravenous Q12H  . vitamin C  500 mg Oral Daily   Continuous Infusions:  Active Problems:   Atrial fibrillation (HCC)   UTI (urinary tract infection)   Syncope   Hypertension   Lactic acidosis   Diastolic heart failure ([place])   Obstructive uropathy   Renal mass   Protein-calorie malnutrition, severe    Time spent: 25 minutes    Barton Dubois  [facility] Pager [phone]. If 7PM-7AM, please contact night-coverage at [email or web address], password [facility] 01/26/2017, 3:21 PM  LOS: 3 days

## 2017-01-26 NOTE — Evaluation (Signed)
Physical Therapy Evaluation Patient Details Name: Penny [name] MRN: [id number] DOB: Jun 05, 1929 Today's Date: 01/26/2017   History of Present Illness  83 y.o. female with history of dementia, recurrent UTI, nephrolithiasis, CHF, atrial fibrillation, hypertension, hyperlipidemia, depression admitted 3/7 for syncope in setting of known UTI.  S/p left ureteral stent placement on 01/25/17   Clinical Impression  Pt admitted with above diagnosis. Pt currently with functional limitations due to the deficits listed below (see PT Problem List).  Pt will benefit from skilled PT to increase their independence and safety with mobility to allow discharge to the venue listed below.  Pt assisted OOB to recliner today.  Pt declined ambulating out of room.  Granddaughter present and reports 24/7 assist at home.  Recommend HHPT.     Follow Up Recommendations Home health PT;Supervision/Assistance - 24 hour    Equipment Recommendations  Rolling walker with 5" wheels    Recommendations for Other Services       Precautions / Restrictions Precautions Precautions: Fall Precaution Comments: incontinent Restrictions Weight Bearing Restrictions: No      Mobility  Bed Mobility Overal bed mobility: Needs Assistance Bed Mobility: Supine to Sit     Supine to sit: HOB elevated;Mod assist     General bed mobility comments: assist for trunk upright and scooting to EOB  Transfers Overall transfer level: Needs assistance Equipment used: Rolling walker (2 wheeled) Transfers: Sit to/from Stand Sit to Stand: Min assist         General transfer comment: verbal cues for hand placement  Ambulation/Gait Ambulation/Gait assistance: Min assist Ambulation Distance (Feet): 4 Feet Assistive device: Rolling walker (2 wheeled) Gait Pattern/deviations: Step-through pattern;Decreased stride length;Narrow base of support     General Gait Details: very short steps, only would ambulate to recliner, declined farther  distance today  Stairs            Wheelchair Mobility    Modified Rankin (Stroke Patients Only)       Balance Overall balance assessment: Needs assistance         Standing balance support: Bilateral upper extremity supported Standing balance-Leahy Scale: Poor Standing balance comment: requiring UE support                             Pertinent Vitals/Pain Pain Assessment: No/denies pain    Home Living Family/patient expects to be discharged to:: Private residence   Available Help at Discharge: Family;Available 24 hours/day;Personal care attendant Type of Home: House Home Access: Level entry     Home Layout: One level Home Equipment: Cane - single point      Prior Function Level of Independence: Needs assistance   Gait / Transfers Assistance Needed: family reports no assistive device for ambulating           Hand Dominance        Extremity/Trunk Assessment        Lower Extremity Assessment Lower Extremity Assessment: Generalized weakness       Communication   Communication: No difficulties  Cognition Arousal/Alertness: Awake/alert Behavior During Therapy: Flat affect Overall Cognitive Status: History of cognitive impairments - at baseline                      General Comments      Exercises     Assessment/Plan    PT Assessment    PT Problem List         PT Treatment Interventions  PT Goals (Current goals can be found in the Care Plan section)  Acute Rehab PT Goals PT Goal Formulation: With patient/family Time For Goal Achievement: 02/02/17 Potential to Achieve Goals: Good    Frequency     Barriers to discharge        Co-evaluation               End of Session                   Time: 4765-4650 PT Time Calculation (min) (ACUTE ONLY): 11 min   Charges:   PT Evaluation $PT Eval Low Complexity: 1 Procedure     PT G Codes:         Nysia Dell,KATHrine E 01/26/2017, 12:47  PM Carmelia Bake, PT, DPT 01/26/2017 Pager: [phone]

## 2017-01-27 NOTE — Care Management Note (Signed)
Case Management Note  Patient Details  Name: Penny [name] MRN: [id number] Date of Birth: March 13, 1929  Subjective/Objective:   UTI, syncope, chronic afib                 Action/Plan: Discharge Planning: NCM spoke to dtr, Penny [name] and son, Penny [name] in the room. States pt lives at home alone but has 24 hour caregivers. Pt has RW and bedside commode. Dtr states pt would benefit from SNF-rehab. She has been to [facility] in the past. Contacted CSW with new referral. Notified attending with family request.   PCP Penny [name]   Expected Discharge Date:               Expected Discharge Plan:  [place]  In-House Referral:  Clinical Social Work  Discharge planning Services  CM Consult  Post Acute Care Choice:  NA Choice offered to:  Adult Children  DME Arranged:  N/A DME Agency:  NA  HH Arranged:  NA HH Agency:  NA  Status of Service:  Completed, signed off  If discussed at [place] of Stay Meetings, dates discussed:    Additional Comments:  Erenest Rasher, RN 01/27/2017, 1:55 PM

## 2017-01-27 NOTE — Care Management Important Message (Signed)
Important Message  Patient Details  Name: Penny [name] MRN: [id number] Date of Birth: Feb 05, 1929   Medicare Important Message Given:  Yes    Erenest Rasher, RN 01/27/2017, 2:00 PM

## 2017-01-27 NOTE — Clinical Social Work Note (Signed)
Clinical Social Work Assessment  Patient Details  Name: Penny [name] MRN: [id number] Date of Birth: 07-09-1929  Date of referral:  01/27/17               Reason for consult:  Facility Placement                Permission sought to share information with:  Facility Sport and exercise psychologist Permission granted to share information::  Yes, Verbal Permission Granted  Name::     [facility] Daughter [phone]  [phone] or Roberts,Jody Daughter   [phone] or Bellatrix, [place] [phone]  [phone]   Agency::  SNF admissions  Relationship::     Contact Information:     Housing/Transportation Living arrangements for the past 2 months:  Single Family Home Source of Information:  Patient, Adult Children Patient Interpreter Needed:  None Criminal Activity/Legal Involvement Pertinent to Current Situation/Hospitalization:  No - Comment as needed Significant Relationships:  Adult Children Lives with:  Self Do you feel safe going back to the place where you live?  No Need for family participation in patient care:  Yes (Comment)  Care giving concerns:  Patient and family feel she needs short term rehab before she is able to return back home.   Social Worker assessment / plan:  Patient is an 83 year old female who lives alone but has 24 hour caregivers.  Patient is alert and oriented x3 and able to express her feelings.  Patient's family was at bedside and feel like she needs some short term rehab before she is able to go back home.  Patient has been to rehab in the past, and is familiar with the process of placement.  CSW explained role and process to family to remind them what is involved with the bed search.  Patient and family gave CSW permission to begin bed search in [facility].  Employment status:  Retired Nurse, adult PT Recommendations:  [place], [place] / Referral to community resources:  [place]  Patient/Family's Response to care:  Patient and family agreeable to going to SNF for short term rehab.  Patient/Family's Understanding of and Emotional Response to Diagnosis, Current Treatment, and Prognosis:  Patient expressed that she is hopeful she will not have to be at SNF very long.  Emotional Assessment Appearance:  Appears stated age Attitude/Demeanor/Rapport:    Affect (typically observed):  Appropriate, Calm Orientation:  Oriented to Self, Oriented to Place, Oriented to Situation Alcohol / Substance use:  Not Applicable Psych involvement (Current and /or in the community):  No (Comment)  Discharge Needs  Concerns to be addressed:  Lack of Support Readmission within the last 30 days:  No Current discharge risk:  None Barriers to Discharge:  No Barriers Identified   Ross Ludwig, LCSWA 01/27/2017, 5:27 PM

## 2017-01-27 NOTE — Clinical Social Work Note (Signed)
CSW received referral from case manager that patient's family would like to go to SNF for short term rehab.  CSW acknowledged consult received.  Jones Broom. [place], MSW, [place]  01/27/2017 2:40 PM

## 2017-01-27 NOTE — Consult Note (Signed)
Urology Consult Note   Requesting Attending Physician:  Barton Dubois, MD Service Providing Consult: Urology Consulting Attending: Diona Fanti, MD  Assessment:  Patient is a 83 y.o. female with history of dementia, recurrent UTI, nephrolithiasis, CHF, atrial fibrillation, hypertension, hyperlipidemia, depression admitted 3/7 for syncope in setting of known UTI.   Incontinent at baseline.  Currently on ceftriaxone for infected urine, cultures pending.   Has required multiple ureteroscopic procedures for kidney stones in the past.  CT scan showed 11 mm calculus in the proximal third of the left upper pole moiety ureter from a partially duplicated collecting system, mild hydronephrosis proximal to this, a large mass associated with the lower pole of the left kidney and a possible urinoma surrounding the left kidney.  Clearly palpable left flank mass on exam. S/p left ureteral stent placement on 01/25/17 with Dr. Diona Fanti. MRI shows mixed solid/cystic lesion emanating from left lower pole of kidney (10x9cm solid; 8x8cm cystic - enhancing), left RP lymph nodes largest 43mm. Clear lungs on CXR 3/7.   Interval: AFVSS. Spontaneously voiding. No new labs. Urine culture showing pan sensitive E coli, now on Ceftin.   Recommendations: 1. Follow up 1-2 weeks with Dr. Diona Fanti requested 2. Please plan to treat with at least 10-14 day course of total antibiotics 3. OK to discharge from our perspective. Please reference note from yesterday for more info regarding stone plan & left renal mass plan.   Thank you for this consult. Please contact the urology consult pager with any further questions/concerns. Sharmaine Base, MD Urology Surgical Resident  Subjective Appears more alert today. No pain. No fevers, n/v. No significant issues. Unaccompanied this morning in room.   Objective   Vital signs in last 24 hours: BP 138/60 (BP Location: Right Arm)   Pulse 95   Temp 98.2 F (36.8 C) (Oral)   Resp 16   Ht  5' 5.5" (1.664 m)   Wt 57.5 kg (126 lb 12.2 oz)   SpO2 94%   BMI 20.77 kg/m   Intake/Output last 3 shifts: I/O last 3 completed shifts: In: 840 [P.O.:840] Out: -   Physical Exam General: NAD, A&O, resting HEENT: [place]/AT, EOMI, MMM Pulmonary: Normal work of breathing on RA Cardiovascular: Regular rate & rhythm, HDS, adequate peripheral perfusion Abdomen: soft, TTP in LLQ, clearly palpable left flank mass, nondistended, no suprapubic fullness or tenderness GU: incontinent into a pad, no CVA tenderness bilaterally DRE: deferred Extremities: warm and well perfused, no edema  Most Recent Labs: Lab Results  Component Value Date   WBC 6.3 01/25/2017   HGB 10.4 (L) 01/25/2017   HCT 32.3 (L) 01/25/2017   PLT 162 01/25/2017    Lab Results  Component Value Date   NA 144 01/25/2017   K 3.9 01/25/2017   CL 114 (H) 01/25/2017   CO2 25 01/25/2017   BUN 18 01/25/2017   CREATININE 0.79 01/25/2017   CALCIUM 9.1 01/25/2017   MG 1.6 (L) 01/25/2017   PHOS 2.7 01/23/2017    Lab Results  Component Value Date   ALKPHOS 68 01/23/2017   BILITOT 1.4 (H) 01/23/2017   BILIDIR 0.2 02/26/2011   PROT 6.4 (L) 01/23/2017   ALBUMIN 3.0 (L) 01/23/2017   ALT <5 (L) 01/23/2017   AST 25 01/23/2017    Lab Results  Component Value Date   INR 1.90 01/24/2017   APTT 97 (H) 01/25/2017     Urine Culture: Pansensitive E coli 01/23/17  IMAGING: No results found.

## 2017-01-27 NOTE — Clinical Social Work Placement (Signed)
   CLINICAL SOCIAL WORK PLACEMENT  NOTE  Date:  01/27/2017  Patient Details  Name: Penny [name] MRN: [id number] Date of Birth: [birth year]  Clinical Social Work is seeking post-discharge placement for this patient at the [place] level of care (*CSW will initial, date and re-position this form in  chart as items are completed):  Yes   Patient/family provided with [facility] Work Department's list of facilities offering this level of care within the geographic area requested by the patient (or if unable, by the patient's family).  Yes   Patient/family informed of their freedom to choose among providers that offer the needed level of care, that participate in Medicare, Medicaid or managed care program needed by the patient, have an available bed and are willing to accept the patient.  Yes   Patient/family informed of [facility]'s ownership interest in [facility] and [facility], as well as of the fact that they are under no obligation to receive care at these facilities.  PASRR submitted to EDS on 01/27/17     PASRR number received on       Existing PASRR number confirmed on 01/27/17     FL2 transmitted to all facilities in geographic area requested by pt/family on 01/27/17     FL2 transmitted to all facilities within larger geographic area on       Patient informed that his/her managed care company has contracts with or will negotiate with certain facilities, including the following:            Patient/family informed of bed offers received.  Patient chooses bed at       Physician recommends and patient chooses bed at      Patient to be transferred to   on  .  Patient to be transferred to facility by       Patient family notified on   of transfer.  Name of family member notified:        PHYSICIAN Please sign FL2, Please sign DNR     Additional Comment:    _______________________________________________ Ross Ludwig,  LCSWA 01/27/2017, 5:31 PM

## 2017-01-27 NOTE — Evaluation (Signed)
Physical Therapy Treatment Patient Details Name: Penny [name] MRN: [id number] DOB: 03-13-1929 Today's Date: 01/27/2017   History of Present Illness  83 y.o. female with history of dementia, recurrent UTI, nephrolithiasis, CHF, atrial fibrillation, hypertension, hyperlipidemia, depression admitted 3/7 for syncope in setting of known UTI.  S/p left ureteral stent placement on 01/25/17   Clinical Impression  Pt assisted with ambulating in hallway and requiring min assist with max verbal cues for mobility.  Family is concerned about providing current assist level and considering SNF at this time.  Pt would benefit from ST- SNF prior to d/c home.    Follow Up Recommendations Supervision/Assistance - 24 hour;SNF    Equipment Recommendations  Rolling walker with 5" wheels    Recommendations for Other Services       Precautions / Restrictions Precautions Precautions: Fall Precaution Comments: incontinent      Mobility  Bed Mobility Overal bed mobility: Needs Assistance Bed Mobility: Supine to Sit     Supine to sit: HOB elevated;Min assist     General bed mobility comments: assist for trunk upright, requires increased time and cues  Transfers Overall transfer level: Needs assistance Equipment used: Rolling walker (2 wheeled) Transfers: Sit to/from Stand Sit to Stand: Min assist         General transfer comment: verbal cues for hand placement and weight shifting, assist to rise  Ambulation/Gait Ambulation/Gait assistance: Min assist Ambulation Distance (Feet): 60 Feet Assistive device: Rolling walker (2 wheeled) Gait Pattern/deviations: Step-through pattern;Decreased stride length     General Gait Details: very short steps, R LE toe out with LE lagging behind, cues for posture, RW positioning and slow pace (faster near end due to fatigue)  Stairs            Wheelchair Mobility    Modified Rankin (Stroke Patients Only)       Balance                                              Pertinent Vitals/Pain Pain Assessment: No/denies pain    Home Living                        Prior Function                 Hand Dominance        Extremity/Trunk Assessment                Communication      Cognition Arousal/Alertness: Awake/alert Behavior During Therapy: Flat affect Overall Cognitive Status: History of cognitive impairments - at baseline                      General Comments      Exercises     Assessment/Plan    PT Assessment Patient needs continued PT services  PT Problem List Decreased strength;Decreased mobility;Decreased activity tolerance;Decreased knowledge of use of DME;Decreased balance;Decreased safety awareness       PT Treatment Interventions DME instruction;Gait training;Functional mobility training;Therapeutic exercise;Therapeutic activities;Patient/family education    PT Goals (Current goals can be found in the Care Plan section)  Acute Rehab PT Goals PT Goal Formulation: With patient/family Time For Goal Achievement: 02/02/17 Potential to Achieve Goals: Good    Frequency Min 3X/week   Barriers to discharge Decreased caregiver support (decreased physical assist available)  Co-evaluation               End of Session Equipment Utilized During Treatment: Gait belt Activity Tolerance: Patient tolerated treatment well Patient left: in chair;with call bell/phone within reach;with family/visitor present Nurse Communication: Mobility status PT Visit Diagnosis: Difficulty in walking, not elsewhere classified (R26.2)         Time: 3291-9166 PT Time Calculation (min) (ACUTE ONLY): 21 min   Charges:     PT Treatments $Gait Training: 8-22 mins   PT G Codes:         Asja Frommer,KATHrine E 01/27/2017, 3:22 PM Carmelia Bake, PT, DPT 01/27/2017 Pager: [phone]

## 2017-01-27 NOTE — Clinical Social Work Note (Addendum)
CSW spoke to patient and her family who were at bedside, patient and family would like to go to SNF for short term rehab.  Patient's first choice is [place], then [facility], and then [facility].  Patient's family gave CSW permission to begin bed search in [facility].  Jones Broom. [place], MSW, [place]  01/27/2017 5:16 PM

## 2017-01-27 NOTE — NC FL2 (Signed)
[place] MEDICAID FL2 LEVEL OF CARE SCREENING TOOL     IDENTIFICATION  Patient Name: Penny [name] Birthdate: 1929/10/22 Sex: female Admission Date (Current Location): 01/23/2017  [facility] [place] Number:  Herbalist and Address:  [facility] [place] [place] [address] [place], [place] [address]      Provider Number: [id number]  Attending Physician Name and Address:  Barton Dubois, MD  Relative Name and Phone Number:  Albin [place]elling Daughter [phone]  [phone] or Roberts,Jody Daughter   [phone] or Juliette, [place] [phone]  [phone]     Current Level of Care: Hospital Recommended Level of Care: [place] Prior Approval Number:    Date Approved/Denied:   PASRR Number: [id number]  Discharge Plan: SNF    Current Diagnoses: Patient Active Problem List   Diagnosis Date Noted  . Protein-calorie malnutrition, severe 01/25/2017  . Obstructive uropathy   . Renal mass   . Lactic acidosis 01/23/2017  . Diastolic heart failure ([place]) 01/23/2017  . Kidney stone 07/21/2015  . Calculus of ureter 06/24/2015  . Kidney stones 10/07/2014  . Renal calculus, bilateral 08/26/2014  . Severe sepsis(995.92) 07/15/2014  . Bacteremia due to Gram-negative bacteria 07/14/2014  . Septic shock ([place]) 07/12/2014  . Acute respiratory failure ([place]) 07/12/2014  . Hypokalemia 07/12/2014  . Pyelonephritis 07/12/2014  . Left humeral fracture 05/19/2014  . Dementia without behavioral disturbance 04/05/2014  . Acute blood loss anemia 04/05/2014  . Hypertension 04/05/2014  . Depression 04/05/2014  . Humerus fracture 03/31/2014    Class: Acute  . Syncope 03/31/2014  . Bradycardia 01/14/2013  . UTI (urinary tract infection) 01/13/2013    Class: Acute  . Syncope and collapse 05/28/2012  . Weight loss, unintentional 03/04/2012  . Inguinal hernia 09/25/2011  . Dizziness - light-headed 06/27/2011  . Malaise and fatigue 03/23/2011  . Right  bundle branch block 03/23/2011  . Hypercholesterolemia 03/23/2011  . Atrial fibrillation ([place]) 02/26/2011  . Long term current use of anticoagulant 02/26/2011    Orientation RESPIRATION BLADDER Height & Weight     Self, Time, Situation, Place  Normal Incontinent Weight: 126 lb 12.2 oz (57.5 kg) Height:  5' 5.5" (166.4 cm)  BEHAVIORAL SYMPTOMS/MOOD NEUROLOGICAL BOWEL NUTRITION STATUS      Incontinent Diet (Regular)  AMBULATORY STATUS COMMUNICATION OF NEEDS Skin   Limited Assist Verbally Normal                       Personal Care Assistance Level of Assistance  Bathing, Feeding, Dressing Bathing Assistance: Limited assistance Feeding assistance: Limited assistance Dressing Assistance: Limited assistance     Functional Limitations Info  Sight, Hearing, Speech Sight Info: Adequate Hearing Info: Adequate Speech Info: Adequate    SPECIAL CARE FACTORS FREQUENCY  PT (By licensed PT)     PT Frequency: 5x a week              Contractures Contractures Info: Not present    Additional Factors Info  Psychotropic Code Status Info: DNR Allergies Info: SULFA ANTIBIOTICS, ZOCOR SIMVASTATIN Psychotropic Info: mirtazapine (REMERON) tablet 15 mg         Current Medications (01/27/2017):  This is the current hospital active medication list Current Facility-Administered Medications  Medication Dose Route Frequency Provider Last Rate Last Dose  . acetaminophen (TYLENOL) tablet 650 mg  650 mg Oral Q6H PRN Barton Dubois, MD      . cefUROXime (CEFTIN) tablet 500 mg  500 mg Oral BID WC Barton Dubois, MD  500 mg at 01/27/17 0910  . cholecalciferol (VITAMIN D) tablet 1,000 Units  1,000 Units Oral Daily Barton Dubois, MD   1,000 Units at 01/27/17 0911  . diazepam (VALIUM) tablet 2.5 mg  2.5 mg Oral Q12H PRN Barton Dubois, MD   2.5 mg at 01/26/17 0006  . digoxin (LANOXIN) tablet 0.125 mg  0.125 mg Oral Q M,W,F Barton Dubois, MD   0.125 mg at 01/25/17 1035  . divalproex (DEPAKOTE) DR  tablet 250 mg  250 mg Oral BID Barton Dubois, MD   250 mg at 01/27/17 0911  . memantine (NAMENDA XR) 24 hr capsule 28 mg  28 mg Oral QHS Barton Dubois, MD   28 mg at 01/26/17 2151   And  . donepezil (ARICEPT) tablet 10 mg  10 mg Oral QHS Barton Dubois, MD   10 mg at 01/26/17 2150  . feeding supplement (BOOST / RESOURCE BREEZE) liquid 1 Container  1 Container Oral TID BM Barton Dubois, MD   1 Container at 01/27/17 0911  . metoprolol tartrate (LOPRESSOR) tablet 12.5 mg  12.5 mg Oral Daily Thayer Headings, MD   12.5 mg at 01/27/17 0910  . mirtazapine (REMERON) tablet 15 mg  15 mg Oral QHS Barton Dubois, MD   15 mg at 01/26/17 2151  . omega-3 acid ethyl esters (LOVAZA) capsule 1 g  1 g Oral Daily Barton Dubois, MD   1 g at 01/27/17 0910  . ondansetron (ZOFRAN) tablet 4 mg  4 mg Oral Q6H PRN Barton Dubois, MD       Or  . ondansetron [facility]) injection 4 mg  4 mg Intravenous Q6H PRN Barton Dubois, MD      . polyethylene glycol (MIRALAX / GLYCOLAX) packet 17 g  17 g Oral Daily Barton Dubois, MD   17 g at 01/26/17 0955  . rivaroxaban (XARELTO) tablet 20 mg  20 mg Oral Q supper Barton Dubois, MD   20 mg at 01/26/17 1734  . rosuvastatin (CRESTOR) tablet 2.5 mg  2.5 mg Oral QHS Barton Dubois, MD   2.5 mg at 01/26/17 2151  . senna-docusate (Senokot-S) tablet 1 tablet  1 tablet Oral BID Barton Dubois, MD   1 tablet at 01/26/17 [phone]  . sodium chloride 0.9 % bolus 1,000 mL  1,000 mL Intravenous Once Gareth Morgan, MD      . sodium chloride flush (NS) 0.9 % injection 3 mL  3 mL Intravenous Q12H Barton Dubois, MD   3 mL at 01/27/17 0912  . vitamin C (ASCORBIC ACID) tablet 500 mg  500 mg Oral Daily Barton Dubois, MD   500 mg at 01/27/17 8309     Discharge Medications: Please see discharge summary for a list of discharge medications.  Relevant Imaging Results:  Relevant Lab Results:   Additional Information SSN [id number]  Ross Ludwig, [place]

## 2017-01-27 NOTE — Progress Notes (Signed)
[facility] PROGRESS NOTE  Penny [name] WIO:[id number] DOB: 04/11/1929 DOA: 01/23/2017 PCP: Marton Redwood, MD  Interim summary and HPI 83 y/o female with PMH significant for dementia, recurrent UTI's, A. Fib (on xarelto), HTN, HLD and depression; who presented to ED after experiencing 2 short episodes of syncope on the day of admission. Per daughter at bedside patient had 2 episodes of losing consciousness while sitting at her porch. Patient's daughter endorses her mother mentioned been lightheaded and nauseated. Is important to mentioned that just 2 weeks ago she was diagnosed with UTI, and has not complete return to her baseline, regarding eating, drinking or been herself physically. Patient is incontinent at baseline and has been experiencing some difficulty with constipation lately. Per daughter she had hx of hemorrhoids and has seen some BRBPR intermittently; also noticed her urine to be darker than usual. Patient denies dysuria, CP, fever, vomiting, HA's, no focal deficit, or any other complaints reported.  Assessment/Plan: 1-syncope: most likely associated with dehydration from UTI infection/poor PO intake; also with high risk for arrhythmias Vs vasovagal events, as patient appears to have fecal impaction. -no abnormalities seen on telemetry.  -continue tx for UTI; now transitioning to PO and looking for a total of 14 days of treatment total. -no fecal impaction seen on CT scan now; but patient has not had a BM. Patient started on bowel regimen and will use suppository  -TSH (slightly elevated), Mg and Phosphorus levels WNL -positive CT for obstructive uropathy; urology on board and s/p left ureter stenting  -provide supportive care and follow clinical response   2-chronic Atrial fibrillation (HCC) -rate controlled and stable overall -will continue low dose metoprolol  -digoxin level 0.3; will continue current digoxin dose. -will resume xarelto -will continue monitoring on  telemetry  -appreciated cardiology recommendations and inputs   3-E. Coli UTI (urinary tract infection), with obstructive uropathy: with big difficulties quantifying symptoms given dementia and baseline incontinence  -will transition to ceftin (base on culture results and sensitivity) -IVF's now stopped; patient to be encourage on increase PO intake. -patient s/p left ureter stent placement and cystoscopy -S/P MRI confirming RCC on left kidney -will follow further urology rec's -per MRI no metastasis appreciated.  4-lactic acidosis -most likely associated with dehydration and infection -resolved with IVF's; last lactic level 1.8 (WNL) -patient w/o fever, no elevation of WBC's and no tachycardia currently  5-Hypertension: -continue metoprolol -BP is stable and well controlled  6-Diastolic heart failure ([place]):  -chronic and compensated  -will follow daily weights -heart healthy diet -strict intake and output   7-HLD -will continue statins and fish oil  8-depression and anxiety  -will continue depakote and PRN valium   9-severe protein-calorie nutrition -continue feeding supplements and encourage patient to eat and drink  Code Status:DNR Family Communication: daughters at bedside  Disposition Plan: per PT re-evaluation and after discussing with family they will like SNF for acute short term rehabilitation. Social work consulted. Patient stable. Hopefully will have bed offers and be placed tomorrow.   Consultants:  Cardiology  Urology   Procedures:  See below for x-ray reports   Left ureter stenting placement/cystoscopy on 01/25/17  Antibiotics:  Rocephin 01/23/17>>>01/26/17  Ceftin 01/26/17  HPI/Subjective: Afebrile currently. Much more interactive today; having lung sitting on the recliner. No complaints.   Objective: Vitals:   01/27/17 0511 01/27/17 1400  BP: 138/60 136/66  Pulse: 95 82  Resp: 16 16  Temp: 98.2 F (36.8 C) 98.7 F (37.1 C)     Intake/Output  Summary (Last 24 hours) at 01/27/17 1657 Last data filed at 01/27/17 0700  Gross per 24 hour  Intake              120 ml  Output                0 ml  Net              120 ml   Filed Weights   01/25/17 0519 01/26/17 0458 01/27/17 0511  Weight: 56.5 kg (124 lb 9 oz) 58.5 kg (128 lb 15.5 oz) 57.5 kg (126 lb 12.2 oz)    Exam:   General:  Afebrile, no CP, no SOB. More interactive today and in no distress. no nausea, no vomiting. Denies abd pain, was eating lunch insisting on the recliner and had BM overnight.  Cardiovascular: irregular, no rubs or gallops, soft murmur, no JVD  Respiratory: good air movement, no wheezing, no crackles  Abdomen: soft, no guarding, positive BS  Musculoskeletal: no edema, no cyanosis   Data Reviewed: Basic Metabolic Panel:  Recent Labs Lab 01/23/17 1519 01/23/17 2307 01/24/17 0600 01/25/17 0600  NA 144  --  145 144  K 4.1  --  3.2* 3.9  CL 106  --  113* 114*  CO2 30  --  24 25  GLUCOSE 202*  --  90 81  BUN 19  --  15 18  CREATININE 0.93  --  0.68 0.79  CALCIUM 9.6  --  9.0 9.1  MG 1.8 1.7  --  1.6*  PHOS  --  2.7  --   --    Liver Function Tests:  Recent Labs Lab 01/23/17 1519  AST 25  ALT <5*  ALKPHOS 68  BILITOT 1.4*  PROT 6.4*  ALBUMIN 3.0*   CBC:  Recent Labs Lab 01/23/17 1519 01/24/17 0600 01/25/17 0600  WBC 9.0 8.5 6.3  NEUTROABS 7.7  --   --   HGB 12.5 10.7* 10.4*  HCT 39.5 32.7* 32.3*  MCV 95.4 94.5 94.7  PLT 202 171 162   Studies: No results found.  Scheduled Meds: . cefUROXime  500 mg Oral BID WC  . cholecalciferol  1,000 Units Oral Daily  . digoxin  0.125 mg Oral Q M,W,F  . divalproex  250 mg Oral BID  . memantine  28 mg Oral QHS   And  . donepezil  10 mg Oral QHS  . feeding supplement  1 Container Oral TID BM  . metoprolol tartrate  12.5 mg Oral Daily  . mirtazapine  15 mg Oral QHS  . omega-3 acid ethyl esters  1 g Oral Daily  . polyethylene glycol  17 g Oral Daily  .  rivaroxaban  20 mg Oral Q supper  . rosuvastatin  2.5 mg Oral QHS  . senna-docusate  1 tablet Oral BID  . sodium chloride  1,000 mL Intravenous Once  . sodium chloride flush  3 mL Intravenous Q12H  . vitamin C  500 mg Oral Daily   Continuous Infusions:  Active Problems:   Atrial fibrillation (HCC)   UTI (urinary tract infection)   Syncope   Hypertension   Lactic acidosis   Diastolic heart failure ([place])   Obstructive uropathy   Renal mass   Protein-calorie malnutrition, severe    Time spent: 25 minutes    Barton Dubois  [facility] Pager [phone]. If 7PM-7AM, please contact night-coverage at [email or web address], password [facility] 01/27/2017, 4:57 PM  LOS: 4 days

## 2017-01-28 DIAGNOSIS — R5381 Other malaise: Secondary | ICD-10-CM

## 2017-01-28 LAB — CULTURE, BLOOD (ROUTINE X 2)
CULTURE: NO GROWTH
Culture: NO GROWTH

## 2017-01-28 LAB — CBC
HCT: 38.4 % (ref 36.0–46.0)
Hemoglobin: 12.2 g/dL (ref 12.0–15.0)
MCH: 29.7 pg (ref 26.0–34.0)
MCHC: 31.8 g/dL (ref 30.0–36.0)
MCV: 93.4 fL (ref 78.0–100.0)
PLATELETS: 181 10*3/uL (ref 150–400)
RBC: 4.11 MIL/uL (ref 3.87–5.11)
RDW: 13.6 % (ref 11.5–15.5)
WBC: 6.5 10*3/uL (ref 4.0–10.5)

## 2017-01-28 LAB — BASIC METABOLIC PANEL
Anion gap: 9 (ref 5–15)
BUN: 17 mg/dL (ref 4–21)
BUN: 17 mg/dL (ref 6–20)
CALCIUM: 10.1 mg/dL (ref 8.9–10.3)
CHLORIDE: 105 mmol/L (ref 101–111)
CO2: 28 mmol/L (ref 22–32)
CREATININE: 0.75 mg/dL (ref 0.44–1.00)
Creatinine: 0.8 mg/dL (ref 0.5–1.1)
GFR calc non Af Amer: >60 mL/min (ref >60)
Glucose, Bld: 80 mg/dL (ref 65–99)
Glucose: 80 mg/dL
Potassium: 3.6 mmol/L (ref 3.5–5.1)
SODIUM: 142 mmol/L (ref 137–147)
SODIUM: 142 mmol/L (ref 135–145)

## 2017-01-28 LAB — CBC AND DIFFERENTIAL: WBC: 6.5 10^3/mL

## 2017-01-28 MED ORDER — DIAZEPAM 5 MG PO TABS: 2.5000 mg | ORAL_TABLET | Freq: Two times a day (BID) | ORAL | 0 refills | Status: DC | PRN

## 2017-01-28 MED ORDER — SENNOSIDES-DOCUSATE SODIUM 8.6-50 MG PO TABS: 1.0000 | ORAL_TABLET | Freq: Two times a day (BID) | ORAL | Status: AC

## 2017-01-28 MED ORDER — BOOST / RESOURCE BREEZE PO LIQD: 1.0000 | Freq: Three times a day (TID) | ORAL | 0 refills | Status: AC

## 2017-01-28 MED ORDER — CEFUROXIME AXETIL 500 MG PO TABS: 500.0000 mg | ORAL_TABLET | Freq: Two times a day (BID) | ORAL | Status: AC

## 2017-01-28 MED ORDER — METOPROLOL TARTRATE 25 MG PO TABS: 12.5000 mg | ORAL_TABLET | Freq: Every day | ORAL | Status: AC

## 2017-01-28 MED ORDER — POLYETHYLENE GLYCOL 3350 17 G PO PACK: 17.0000 g | PACK | Freq: Every day | ORAL | Status: AC

## 2017-01-28 NOTE — Clinical Social Work Placement (Signed)
Patient received and accepted bed offer at [facility]. Patient to be transported via [facility]. Facility awaiting discharge summary. Patient's RN can call report to [phone] after patient's discharge summary is received by facility, CSW will notify patient's RN.CSW will coordinate transportation after sending dc summary to facility.   CLINICAL SOCIAL WORK PLACEMENT  NOTE  Date:  01/28/2017  Patient Details  Name: Penny [name] MRN: [id number] Date of Birth: Apr 15, 1929  Clinical Social Work is seeking post-discharge placement for this patient at the [place] level of care (*CSW will initial, date and re-position this form in  chart as items are completed):  Yes   Patient/family provided with [place] Work Department's list of facilities offering this level of care within the geographic area requested by the patient (or if unable, by the patient's family).  Yes   Patient/family informed of their freedom to choose among providers that offer the needed level of care, that participate in Medicare, Medicaid or managed care program needed by the patient, have an available bed and are willing to accept the patient.  Yes   Patient/family informed of [facility]'s ownership interest in [facility] and [facility], as well as of the fact that they are under no obligation to receive care at these facilities.  PASRR submitted to EDS on 01/27/17     PASRR number received on       Existing PASRR number confirmed on 01/27/17     FL2 transmitted to all facilities in geographic area requested by pt/family on 01/27/17     FL2 transmitted to all facilities within larger geographic area on       Patient informed that his/her managed care company has contracts with or will negotiate with certain facilities, including the following:        Yes   Patient/family informed of bed offers received.  Patient chooses bed at [facility]     Physician  recommends and patient chooses bed at      Patient to be transferred to [facility] on 01/28/17.  Patient to be transferred to facility by [facility]     Patient family notified on 01/28/17 of transfer.  Name of family member notified:  Verda Cumins      PHYSICIAN Please sign DNR, Please prepare priority discharge summary, including medications     Additional Comment:    _______________________________________________ Burnis Medin, LCSW 01/28/2017, 11:08 AM

## 2017-01-28 NOTE — Clinical Social Work Placement (Signed)
CSW provided patient's caregiver (erica [phone] bed offers. Patient's caregiver reported that [facility] is the first choice, if they have a private room available. CSW awaiting return call from [facility]. Patient's caregiver reported that [facility] is their second choice, if private room is available. CSW will continue to follow patient for discharge needs.  CLINICAL SOCIAL WORK PLACEMENT  NOTE  Date:  01/28/2017  Patient Details  Name: Penny [name] MRN: [id number] Date of Birth: 1929/04/28  Clinical Social Work is seeking post-discharge placement for this patient at the [place] level of care (*CSW will initial, date and re-position this form in  chart as items are completed):  Yes   Patient/family provided with [place] Work Department's list of facilities offering this level of care within the geographic area requested by the patient (or if unable, by the patient's family).  Yes   Patient/family informed of their freedom to choose among providers that offer the needed level of care, that participate in Medicare, Medicaid or managed care program needed by the patient, have an available bed and are willing to accept the patient.  Yes   Patient/family informed of [facility]'s ownership interest in [facility] and [facility], as well as of the fact that they are under no obligation to receive care at these facilities.  PASRR submitted to EDS on 01/27/17     PASRR number received on       Existing PASRR number confirmed on 01/27/17     FL2 transmitted to all facilities in geographic area requested by pt/family on 01/27/17     FL2 transmitted to all facilities within larger geographic area on       Patient informed that his/her managed care company has contracts with or will negotiate with certain facilities, including the following:        Yes   Patient/family informed of bed offers received.  Patient chooses bed at        Physician recommends and patient chooses bed at      Patient to be transferred to   on  .  Patient to be transferred to facility by       Patient family notified on   of transfer.  Name of family member notified:        PHYSICIAN Please sign FL2, Please sign DNR     Additional Comment:    _______________________________________________ Burnis Medin, LCSW 01/28/2017, 10:36 AM

## 2017-01-28 NOTE — Discharge Summary (Signed)
Physician Discharge Summary  Penny [name] EVO:[id number] DOB: 1929/04/02 DOA: 01/23/2017  PCP: Marton Redwood, MD  Admit date: 01/23/2017 Discharge date: 01/28/2017  Time spent: 35 minutes  Recommendations for Outpatient Follow-up:  1. Repeat BMET to follow electrolytes 2. Repeat Thyroid panel 2-3 weeks to reassess level (TSH mildly elevated)   Discharge Diagnoses:  Active Problems:   Atrial fibrillation (HCC)   UTI (urinary tract infection)   Syncope   Hypertension   Lactic acidosis   Diastolic heart failure (HCC)   Obstructive uropathy   Renal mass   Protein-calorie malnutrition, severe   Physical deconditioning   Discharge Condition: stable and improved. Discharge to SNF for rehabilitation. Will follow with urology as an outpatient.  Diet recommendation: heart healthy/low sodium diet   Filed Weights   01/26/17 0458 01/27/17 0511 01/28/17 0506  Weight: 58.5 kg (128 lb 15.5 oz) 57.5 kg (126 lb 12.2 oz) 55.3 kg (121 lb 14.6 oz)    History of present illness:  83 y/o female with PMH significant for dementia, recurrent UTI's, A. Fib (on xarelto), HTN, HLD and depression; who presented to ED after experiencing 2 short episodes of syncope on the day of admission. Per daughter at bedside patient had 2 episodes of losing consciousness while sitting at her porch. Patient's daughter endorses her mother mentioned been lightheaded and nauseated. Is important to mentioned that just 2 weeks ago she was diagnosed with UTI, and has not complete return to her baseline, regarding eating, drinking or been herself physically. Patient is incontinent at baseline and has been experiencing some difficulty with constipation lately. Per daughter she had hx of hemorrhoids and has seen some BRBPR intermittently; also noticed her urine to be darker than usual. Patient denies dysuria, CP, fever, vomiting, HA's, no focal deficit, or any other complaints reported.  Hospital Course:  1-syncope: most likely  associated with dehydration from UTI infection/poor PO intake; and also vasovagal events, as patient appears to have fecal impaction vs probably problems with micturition.. -no abnormalities seen on telemetry.  -continue tx for UTI; now transitioning to PO antibiotics and looking for a total of 14 days of treatment. -no fecal impaction seen on CT scan now; but patient with stool content in her colon. Patient started on bowel regimen and has experienced 2 BM's during hospitalization.  -TSH (slightly elevated), Mg and Phosphorus levels WNL -positive CT for obstructive uropathy; urology on board and s/p left ureter stenting   2-chronic Atrial fibrillation (HCC) -rate controlled and stable overall -will continue low dose metoprolol (12.5 mg daily) -will continue current digoxin dose. -will resume xarelto -appreciated cardiology recommendations and inputs   3-E. Coli UTI (urinary tract infection), with obstructive uropathy: with big difficulties quantifying symptoms on admission given dementia and baseline incontinence.  -will transition to ceftin (base on culture results and sensitivity) -IVF's now stopped; patient to be encourage on increase PO intake. -patient s/p left ureter stent placement and cystoscopy -will follow up with urology as an outaptient  4-lactic acidosis -most likely associated with dehydration and infection -resolved with IVF's; last lactic level 1.8 (WNL) -patient w/o fever, no elevation of WBC's and no tachycardia   5-Hypertension: -continue metoprolol -BP is stable and well controlled  6-Diastolic heart failure ([place]):  -chronic and compensated  -will recommend to follow daily weights -heart healthy/low sodium diet  7-HLD -will continue statins and fish oil  8-depression and anxiety  -will continue depakote and PRN valium   9-severe protein-calorie nutrition -continue feeding supplements and encourage patient to  eat and drink  10-left renal  mass: -S/P MRI confirming RCC on left kidney -no metastasis seen on MRI -will follow up with urology   Procedures:  See below for x-ray reports   Left ureter stenting placement/cystoscopy on 01/25/17  Consultations:  Urology  Cardiology   Discharge Exam: Vitals:   01/27/17 2104 01/28/17 0506  BP: (!) 151/62 136/74  Pulse: 79 81  Resp: 18 18  Temp: 98.1 F (36.7 C) 97.9 F (36.6 C)    General:  Afebrile, no CP, no SOB. More interactive today and in no distress. no nausea, no vomiting. Denies abd pain.  Cardiovascular: irregular rhythm, no rubs or gallops, soft murmur, no JVD  Respiratory: good air movement, no wheezing, no crackles  Abdomen: soft, no guarding, positive BS  Musculoskeletal: no edema, no cyanosis   Discharge Instructions   Discharge Instructions    (HEART FAILURE PATIENTS) Call MD:  Anytime you have any of the following symptoms: 1) 3 pound weight gain in 24 hours or 5 pounds in 1 week 2) shortness of breath, with or without a dry hacking cough 3) swelling in the hands, feet or stomach 4) if you have to sleep on extra pillows at night in order to breathe.    Complete by:  As directed    Diet - low sodium heart healthy    Complete by:  As directed    Discharge instructions    Complete by:  As directed    Maintain adequate hydration Physical therapy as per SNF protocol Follow up with urology service as instructed (contcat office for appointment details if needed) Watch amount of sodium intake (goal is not more than 2.5 gram daily)     Current Discharge Medication List    START taking these medications   Details  cefUROXime (CEFTIN) 500 MG tablet Take 1 tablet (500 mg total) by mouth 2 (two) times daily with a meal.    feeding supplement (BOOST / RESOURCE BREEZE) LIQD Take 1 Container by mouth 3 (three) times daily between meals. Refills: 0    polyethylene glycol (MIRALAX / GLYCOLAX) packet Take 17 g by mouth daily. Hold for diarrhea     senna-docusate (SENOKOT-S) 8.6-50 MG tablet Take 1 tablet by mouth 2 (two) times daily.      CONTINUE these medications which have CHANGED   Details  diazepam (VALIUM) 5 MG tablet Take 0.5-1 tablets (2.5-5 mg total) by mouth every 12 (twelve) hours as needed for anxiety. Qty: 10 tablet, Refills: 0    metoprolol tartrate (LOPRESSOR) 25 MG tablet Take 0.5 tablets (12.5 mg total) by mouth daily.      CONTINUE these medications which have NOT CHANGED   Details  acetaminophen (TYLENOL) 500 MG tablet Take 500 mg by mouth at bedtime.     cholecalciferol (VITAMIN D) 1000 UNITS tablet Take 1,000 Units by mouth every morning.     CRANBERRY PO Take 1 capsule by mouth every morning.     digoxin (LANOXIN) 0.125 MG tablet Take 0.125 mg by mouth every Monday, Wednesday, and Friday.     divalproex (DEPAKOTE) 250 MG DR tablet Take 250 mg by mouth 2 (two) times daily.    mirtazapine (REMERON) 15 MG tablet Take 15 mg by mouth at bedtime.     NAMZARIC 28-10 MG CP24 Take 1 capsule by mouth each night  TO REPLACE DONEPEZIL Refills: 1    Omega-3 Fatty Acids (FISH OIL PO) Take 1 capsule by mouth every morning.     Rivaroxaban (  XARELTO) 20 MG TABS tablet Take 20 mg by mouth at bedtime.  Qty: 30 tablet    rosuvastatin (CRESTOR) 5 MG tablet Take 2.5 mg by mouth at bedtime.    vitamin C (ASCORBIC ACID) 500 MG tablet Take 500 mg by mouth every morning.       STOP taking these medications     cephALEXin (KEFLEX) 500 MG capsule      furosemide (LASIX) 20 MG tablet      potassium chloride (K-DUR) 10 MEQ tablet        Allergies  Allergen Reactions  . Sulfa Antibiotics Rash  . Zocor [Simvastatin] Other (See Comments)    Old allergy. Unsure reaction   Follow-up Information    DAHLSTEDT, Lillette Boxer, MD. Call today.   Specialty:  Urology Why:  contact office for appointment details  Contact information: [place] [address] [phone]        Marton Redwood, MD. Schedule  an appointment as soon as possible for a visit in 1 week(s).   Specialty:  Internal Medicine Why:  after discharge from SNF Contact information: [address] [place] [address] [phone]           The results of significant diagnostics from this hospitalization (including imaging, microbiology, ancillary and laboratory) are listed below for reference.    Significant Diagnostic Studies: Dg Chest 2 View  Result Date: 01/23/2017 CLINICAL DATA:  Syncope x2 today. EXAM: CHEST  2 VIEW COMPARISON:  Single-view of the chest 01/08/2017. PA and lateral chest 06/24/2015. FINDINGS: The lungs are clear. There is cardiomegaly. No pneumothorax or pleural effusion. Aortic atherosclerosis is seen. No acute bony abnormality. The patient is status post right shoulder replacement. IMPRESSION: Cardiomegaly without acute disease. Electronically Signed   By: Inge Rise M.D.   On: 01/23/2017 17:06   Ct Head Wo Contrast  Result Date: 01/08/2017 CLINICAL DATA:  83 y/o  F; weakness. EXAM: CT HEAD WITHOUT CONTRAST TECHNIQUE: Contiguous axial images were obtained from the base of the skull through the vertex without intravenous contrast. COMPARISON:  03/31/2014 CT of the head. FINDINGS: Brain: No evidence of acute infarction, hemorrhage, hydrocephalus, extra-axial collection or mass lesion/mass effect. Stable moderate chronic microvascular ischemic changes and parenchymal volume loss of the brain. Vascular: Calcific atherosclerosis of cavernous and paraclinoid internal carotid arteries. Skull: Normal. Negative for fracture or focal lesion. Sinuses/Orbits: No acute finding. Other: None. IMPRESSION: 1. No acute intracranial abnormality. 2. Stable moderate chronic microvascular ischemic changes of the brain and parenchymal volume loss. Electronically Signed   By: Kristine Garbe M.D.   On: 01/08/2017 22:20   Mr Abdomen W Wo Contrast  Result Date: 01/25/2017 CLINICAL DATA:  Evaluate large left-sided  retroperitoneal mass seen on recent CT scan. EXAM: MRI ABDOMEN WITHOUT AND WITH CONTRAST TECHNIQUE: Multiplanar multisequence MR imaging of the abdomen was performed both before and after the administration of intravenous contrast. CONTRAST:  72mL MULTIHANCE GADOBENATE DIMEGLUMINE 529 MG/ML IV SOLN COMPARISON:  CT scan 01/23/2017 and prior study from 07/12/2014 FINDINGS: Lower chest: Knee heart is mildly enlarged. No pericardial effusion. Persistent right basilar atelectasis and bibasilar scarring. No obvious pulmonary lesions. No pleural or pericardial effusion. Hepatobiliary: Numerous benign hepatic cysts several of which are septated. No worrisome hepatic lesions or intrahepatic biliary dilatation. The gallbladder is grossly normal. The no common bile duct dilatation. Pancreas: No mass, inflammation or ductal dilatation. Mild atrophy. As demonstrated on the CT scan there is a cystic lesion located chest inferior to the body  region of the pancreas. This measures 4.0 x 2.5 cm. No enhancement is demonstrated. This is likely pseudocyst or benign peritoneal inclusion cyst. Spleen:  Normal size.  No focal lesions. Adrenals/Urinary Tract:  The adrenal glands are unremarkable. The 8 right kidney is unremarkable and stable. Duplicated collecting system again demonstrated. There is a large mass with cystic and solid components involving the lower pole moiety of the duplicated left kidney. The solid component measures 10 x 9 cm and demonstrates extensive enhancement. The more posterior complex cystic component measures 8.4 x 8.4 cm and is septated and may contain some debris. The upper pole moiety of the left kidney demonstrates persistent hydronephrosis with an obstructing ureteral calculus. There are few small scattered left-sided retroperitoneal lymph nodes without overt adenopathy. The largest node measures 12 x 5.5 mm. Stomach/Bowel: Visualized portions within the abdomen are unremarkable. Duodenum diverticuli are  noted. Vascular/Lymphatic: Scattered small mesenteric and retroperitoneal lymph nodes but no overt adenopathy. The left renal vein is patent. Other: No ascites or abdominal wall hernia. No subcutaneous lesions. There is a stable solid mesenteric mass located just anterior to the left kidney. The this measures 5.2 x 2.8 cm and is unchanged since 2015. A contains a few calcifications and is relatively dark on T2. No contrast enhancement. Musculoskeletal: No significant bony findings. IMPRESSION: 1. Large cystic and solid mass lesion associated with the lower pole moiety of the left kidney most consistent with renal cell carcinoma. No overt adenopathy or left renal vein involvement. No findings to suggest metastatic disease. 2. Stable multiple hepatic cysts. 3. Benign-appearing fluid collection just inferior to the pancreatic body, likely pseudocyst. 4. Stable benign left-sided mesenteric mass since 2015. No worrisome MR imaging features. 5. Stable hydronephrosis of the upper pole moiety of the left kidney with an obstructing ureteral calculus. Electronically Signed   By: Marijo Sanes M.D.   On: 01/25/2017 09:35   Dg Chest Port 1 View  Result Date: 01/08/2017 CLINICAL DATA:  Acute onset of generalized weakness. Initial encounter. EXAM: PORTABLE CHEST 1 VIEW COMPARISON:  Chest radiograph performed 06/24/2015 FINDINGS: The lungs are well-aerated. Mildly increased interstitial markings are noted. There is no evidence of pleural effusion or pneumothorax. The cardiomediastinal silhouette is borderline enlarged. No acute osseous abnormalities are seen. The right shoulder arthroplasty is grossly unremarkable in appearance. IMPRESSION: Mildly increased interstitial markings noted. Borderline cardiomegaly. Electronically Signed   By: Garald Balding M.D.   On: 01/08/2017 22:02   Ct Renal Stone Study  Result Date: 01/23/2017 CLINICAL DATA:  83 year old female with history of nausea and syncope. Recurrent urinary tract  infections. EXAM: CT ABDOMEN AND PELVIS WITHOUT CONTRAST TECHNIQUE: Multidetector CT imaging of the abdomen and pelvis was performed following the standard protocol without IV contrast. COMPARISON:  CT the abdomen and pelvis 07/12/2014. FINDINGS: Lower chest: Scarring in the left lower lobe. Mild cardiomegaly. Atherosclerotic calcifications in the left anterior descending, left circumflex and right coronary arteries. Hepatobiliary: Calcified granuloma in the liver again noted. Several small well-defined low-attenuation lesions are again noted scattered throughout the liver, incompletely characterized on today's noncontrast CT examination, but similar to the prior study, presumably small cysts, largest of which measures up to 1.8 cm in segment 6. Gallbladder is normal in appearance. Pancreas: Extending off the inferior aspect of the body of the pancreas there is a well-circumscribed 4.3 x 2.4 x 3.6 cm low-attenuation lesion (axial image 27 of series 2 and sagittal image 113 of series 4) which is new compared to the prior examination, and  incompletely characterized on today's noncontrast CT examination, but favored to represent a pancreatic pseudocyst (this measures only -27 HU, suggesting that it contains fluid with some saponified fat). Spleen: Unremarkable. Adrenals/Urinary Tract: 11 mm calculus in the proximal third of the left upper pole ureter (left renal collecting system and proximal ureters are duplicated) shortly beyond the left ureteropelvic junction. This is associated with mild proximal left hydroureteronephrosis involving the upper pole moiety. No additional calculi are noted within the right renal collecting system, elsewhere along the course of either ureter, or within the lumen of the urinary bladder. Right kidney and bilateral adrenal glands are normal in appearance. Left kidney is partially distorted by a mass. It is uncertain whether not this mass arises from the left kidney or is adjacent to the  left kidney. Previous studies have demonstrated a partially calcified soft tissue mass in this region, however, this mass appears anteriorly displaced by a new larger intermediate attenuation mass on today's examination. Specifically, the new mass is favored to arise from the lower pole of the left kidney and measures 7.2 x 9.4 x 9.9 cm (axial image 43 of series 2 and coronal image 58 of series 3) and measures intermediate attenuation (40 HU). There is also a large low-attenuation perinephric fluid collection posterior to the left kidney which measures 4.6 x 0.3 x 6.7 cm (axial image 39 of series 2 and coronal image 78 of series 3). Unenhanced appearance of the urinary bladder is normal. Stomach/Bowel: Appearance of the stomach is normal. There is no pathologic dilatation of small bowel or colon. Normal appendix. Vascular/Lymphatic: Aortic atherosclerosis, without definite aneurysm in the abdominal or pelvic vasculature. No definite lymphadenopathy noted in the abdomen or pelvis on today's noncontrast CT examination. Reproductive: Status post hysterectomy. Ovaries are not confidently identified may be surgically absent or atrophic. Other: Previously described soft tissue mass in the left side of the abdomen is similar in size to prior examinations, currently measuring 2.9 x 5.5 x 4.7 cm (axial image 43 of series 2 and coronal image 39 of series 3), but has been displaced anteriorly by the large left-sided renal or perirenal mass discussed above. Musculoskeletal: Multiple chronic sclerotic lesions are again noted throughout the pelvis, similar to prior examinations, presumably benign. No other new aggressive appearing lytic or blastic lesions are noted in the visualized portions of the skeleton. IMPRESSION: 1. 11 mm calculus in the proximal third of the left ureter from the upper pole moiety of the patient's left kidney (duplicated left renal collecting system and proximal ureters), with mild proximal left  hydroureteronephrosis indicative of at least partial obstruction. 2. Large mass in the left side of the retroperitoneum which appears intimately associated with the lower pole of the left kidney and is favored to represent a primary renal neoplasm such as a renal cell carcinoma. This may alternatively represent a large hemorrhage, however, that is not favored. This could be further delineated with MRI of the abdomen with and without IV gadolinium if clinically appropriate. 3. New low-attenuation (-27 HU) cystic appearing lesion extending off the inferior aspect of the mid body of the pancreas, favored to represent a pancreatic pseudocyst. This too could be better evaluated with followup MRI of the abdomen with and without IV gadolinium. 4. Large left-sided low-attenuation perinephric fluid collection could represent a urinoma in the setting of an obstructed upper pole collecting system. 5. Aortic atherosclerosis, in addition to at least 3 vessel coronary artery disease. 6. Cardiomegaly. 7. Additional incidental findings, as above. Electronically Signed  By: Vinnie Langton M.D.   On: 01/23/2017 19:41    Microbiology: Recent Results (from the past 240 hour(s))  Blood culture (routine x 2)     Status: None (Preliminary result)   Collection Time: 01/23/17  2:11 PM  Result Value Ref Range Status   Specimen Description BLOOD BLOOD RIGHT FOREARM  Final   Special Requests IN PEDIATRIC BOTTLE 2CC  Final   Culture   Final    NO GROWTH 4 DAYS Performed at [facility], [place] [address] [place], [place] [address]    Report Status PENDING  Incomplete  Culture, Urine     Status: Abnormal   Collection Time: 01/23/17  2:47 PM  Result Value Ref Range Status   Specimen Description URINE, RANDOM  Final   Special Requests NONE  Final   Culture >=100,000 COLONIES/mL ESCHERICHIA COLI (A)  Final   Report Status 01/26/2017 FINAL  Final   Organism ID, Bacteria ESCHERICHIA COLI (A)  Final      Susceptibility    Escherichia coli - MIC*    AMPICILLIN <=2 SENSITIVE Sensitive     CEFAZOLIN <=4 SENSITIVE Sensitive     CEFTRIAXONE <=1 SENSITIVE Sensitive     CIPROFLOXACIN <=0.25 SENSITIVE Sensitive     GENTAMICIN <=1 SENSITIVE Sensitive     IMIPENEM <=0.25 SENSITIVE Sensitive     NITROFURANTOIN <=16 SENSITIVE Sensitive     TRIMETH/SULFA <=20 SENSITIVE Sensitive     AMPICILLIN/SULBACTAM <=2 SENSITIVE Sensitive     PIP/TAZO <=4 SENSITIVE Sensitive     Extended ESBL NEGATIVE Sensitive     * >=100,000 COLONIES/mL ESCHERICHIA COLI  Blood culture (routine x 2)     Status: None (Preliminary result)   Collection Time: 01/23/17  5:15 PM  Result Value Ref Range Status   Specimen Description BLOOD LEFT ANTECUBITAL  Final   Special Requests BOTTLES DRAWN AEROBIC AND ANAEROBIC 5CC  Final   Culture   Final    NO GROWTH 4 DAYS Performed at [facility], [address] [place], [place] [address]    Report Status PENDING  Incomplete  Surgical pcr screen     Status: None   Collection Time: 01/25/17 11:05 AM  Result Value Ref Range Status   MRSA, PCR NEGATIVE NEGATIVE Final   Staphylococcus aureus NEGATIVE NEGATIVE Final    Comment:        The Xpert SA Assay (FDA approved for NASAL specimens in patients over 12 years of age), is one component of a comprehensive surveillance program.  Test performance has been validated by [facility] for patients greater than or equal to 75 year old. It is not intended to diagnose infection nor to guide or monitor treatment.      Labs: Basic Metabolic Panel:  Recent Labs Lab 01/23/17 1519 01/23/17 2307 01/24/17 0600 01/25/17 0600 01/28/17 0513  NA 144  --  145 144 142  K 4.1  --  3.2* 3.9 3.6  CL 106  --  113* 114* 105  CO2 30  --  24 25 28   GLUCOSE 202*  --  90 81 80  BUN 19  --  15 18 17   CREATININE 0.93  --  0.68 0.79 0.75  CALCIUM 9.6  --  9.0 9.1 10.1  MG 1.8 1.7  --  1.6*  --   PHOS  --  2.7  --   --   --    Liver Function  Tests:  Recent Labs Lab 01/23/17 1519  AST  25  ALT <5*  ALKPHOS 68  BILITOT 1.4*  PROT 6.4*  ALBUMIN 3.0*   CBC:  Recent Labs Lab 01/23/17 1519 01/24/17 0600 01/25/17 0600 01/28/17 0513  WBC 9.0 8.5 6.3 6.5  NEUTROABS 7.7  --   --   --   HGB 12.5 10.7* 10.4* 12.2  HCT 39.5 32.7* 32.3* 38.4  MCV 95.4 94.5 94.7 93.4  PLT 202 171 162 181    Signed:  Barton Dubois MD.  [facility] 01/28/2017, 11:32 AM

## 2017-01-28 NOTE — Progress Notes (Signed)
CSW sent dc summary to [employer]. CSW notified patient's RN that she can call report to facility. CSW contacted and informed patient's daughter Juliann Pulse that she will need to sign patient in at [facility], she agreed. CSW coordinated transportation for patient via [facility] for today at 1:00pm.   Abundio Miu, [place] Worker [facility] Cell#: [phone]

## 2017-01-29 ENCOUNTER — Encounter: Payer: Self-pay | Admitting: Internal Medicine

## 2017-01-29 ENCOUNTER — Non-Acute Institutional Stay (SKILLED_NURSING_FACILITY): Payer: Medicare Other | Admitting: Internal Medicine

## 2017-01-29 DIAGNOSIS — E872 Acidosis, unspecified: Secondary | ICD-10-CM

## 2017-01-29 DIAGNOSIS — I482 Chronic atrial fibrillation, unspecified: Secondary | ICD-10-CM

## 2017-01-29 DIAGNOSIS — N390 Urinary tract infection, site not specified: Secondary | ICD-10-CM | POA: Diagnosis not present

## 2017-01-29 DIAGNOSIS — B9620 Unspecified Escherichia coli [E. coli] as the cause of diseases classified elsewhere: Secondary | ICD-10-CM

## 2017-01-29 DIAGNOSIS — R946 Abnormal results of thyroid function studies: Secondary | ICD-10-CM

## 2017-01-29 DIAGNOSIS — I5032 Chronic diastolic (congestive) heart failure: Secondary | ICD-10-CM

## 2017-01-29 DIAGNOSIS — E7800 Pure hypercholesterolemia, unspecified: Secondary | ICD-10-CM

## 2017-01-29 DIAGNOSIS — N2889 Other specified disorders of kidney and ureter: Secondary | ICD-10-CM | POA: Diagnosis not present

## 2017-01-29 DIAGNOSIS — R55 Syncope and collapse: Secondary | ICD-10-CM

## 2017-01-29 DIAGNOSIS — R7989 Other specified abnormal findings of blood chemistry: Secondary | ICD-10-CM

## 2017-01-29 DIAGNOSIS — I10 Essential (primary) hypertension: Secondary | ICD-10-CM

## 2017-01-29 DIAGNOSIS — N139 Obstructive and reflux uropathy, unspecified: Secondary | ICD-10-CM

## 2017-01-29 NOTE — Progress Notes (Signed)
: Provider:  Noah Delaine. Sheppard Coil, MD Location:  [facility] Room Number: [phone] Place of Service:  SNF ([phone])  PCP: Marton Redwood, MD Patient Care Team: Marton Redwood, MD as PCP - General (Internal Medicine)  Extended Emergency Contact Information Primary Emergency Contact: Rosemary Holms, [place] [address] [place] Phone: [phone] Mobile Phone: [phone] Relation: Daughter Secondary Emergency Contact: [name], [place] [address] [place] Mobile Phone: [phone] Relation: Daughter     Allergies: Sulfa antibiotics and Zocor [simvastatin]  Chief Complaint  Patient presents with  . New Admit To SNF    Admit to Facility    HPI: Patient is 83 y.o. female with dementia, recurrent UTI's, A. Fib (on xarelto), HTN, HLD and depression; who presented to ED after experiencing 2 short episodes of syncope on the day of admission. Per daughter at bedside patient had 2 episodes of losing consciousness while sitting at her porch. Patient's daughter endorses her mother mentioned been lightheaded and nauseated. Is important to mentioned that just 2 weeks ago she was diagnosed with UTI, and has not complete return to her baseline, regarding eating, drinking or been herself physically. Patient is incontinent at baseline and has been experiencing some difficulty with constipation lately. Per daughter she had hx of hemorrhoids and has seen some BRBPR intermittently; also noticed her urine to be darker than usual. Patient denies dysuria, CP, fever, vomiting, HA's, no focal deficit, or any other complaints reported. Pt was admitted [facility] from [facility]/7[facility]12 where she was worked up for syncope 2/2 dehydration and a UTI complicated by obstructive uropathy for which pt was stented in L ureter and which was treated by IVF and IV abx before being transitioned to Ceftin. Course was further complicated by the finding of a L renal  mass. Pt is admitted to SNF with generalized weakness for OT/PT. While at SNF pt will be followed for chronic AF, tx with digoxin, metoprolol and xarelto,HTN, tx with metoprolol, and chronic dCHF tx with metoprolol.  Past Medical History:  Diagnosis Date  . Alzheimer's dementia 03/31/2014  . Anginal pain ([place])   . Anxiety   . Arthritis    "joints" (03/31/2014)  . Atrial fibrillation ([place])   . Blood in urine    "chronic" (03/31/2014)  . CHF (congestive heart failure) ([place])   . Chronic shoulder pain   . Depression   . Dyslipidemia   . Dysrhythmia    atrail fib   . Heart palpitations    rare  . Humerus fracture 03/31/2014   "fell and broke left arm"  . Hx of transfusion of packed red blood cells   . Hyperlipidemia   . Hypertension   . Kidney stones   . Long-term (current) use of anticoagulants   . Memory problem    "a little short term and long term memory loss" (03/31/2014)  . Osteopenia    "severe" (03/31/2014)  . Seasonal allergies   . Syncope and collapse 03/31/2014    Past Surgical History:  Procedure Laterality Date  . APPENDECTOMY  ~ 1953  . BLADDER SUSPENSION     w/hysterectomy  . CARDIOVERSION  2006  . CYSTOSCOPY W/ URETERAL STENT PLACEMENT Left 01/25/2017   Procedure: CYSTOSCOPY WITH LEFT  RETROGRADE PYELOGRAM/URETERAL STENT PLACEMENT;  Surgeon: Franchot Gallo, MD;  Location: [facility];  Service: Urology;  Laterality: Left;  . CYSTOSCOPY WITH  RETROGRADE PYELOGRAM, URETEROSCOPY AND STENT PLACEMENT Bilateral 08/26/2014   Procedure: CYSTOSCOPY WITH RETROGRADE PYELOGRAM, URETEROSCOPY , EXTRACTION OF STONES AND STENT PLACEMENT;  Surgeon: Jorja Loa, MD;  Location: [facility];  Service: Urology;  Laterality: Bilateral;  . CYSTOSCOPY WITH STENT PLACEMENT Right 06/24/2015   Procedure: CYSTOSCOPY URETEROSCOPY, PYLEOGRAM, WITH STENT PLACEMENT X2 ;  Surgeon: Franchot Gallo, MD;  Location: [facility];  Service: Urology;  Laterality: Right;  . CYSTOSCOPY WITH URETEROSCOPY, STONE  BASKETRY AND STENT PLACEMENT Bilateral 07/21/2015   Procedure: CYSTOSCOPY WITH  right URETEROSCOPY, STONE BASKETRY bilateral pylegram  removal bilateral double j stents;  Surgeon: Franchot Gallo, MD;  Location: [facility];  Service: Urology;  Laterality: Bilateral;  . CYSTOSCOPY/RETROGRADE/URETEROSCOPY/STONE EXTRACTION WITH BASKET Right 10/08/2014   Procedure: CYSTOSCOPY, retrograde pyelogram,URETEROSCOPY, laser treatment of calculus, stent exchange;  Surgeon: Jorja Loa, MD;  Location: [facility];  Service: Urology;  Laterality: Right;  . HOLMIUM LASER APPLICATION Left 40/07/8118   Procedure: HOLMIUM LASER APPLICATION;  Surgeon: Jorja Loa, MD;  Location: [facility];  Service: Urology;  Laterality: Left;  . HOLMIUM LASER APPLICATION Right 11/23/7827   Procedure: HOLMIUM LASER APPLICATION;  Surgeon: Franchot Gallo, MD;  Location: [facility];  Service: Urology;  Laterality: Right;  . KNEE ARTHROSCOPY Right 2011  . MENISCUS REPAIR Left 07/2000  . NEPHROLITHOTOMY Right 06/04/2013   Procedure: NEPHROLITHOTOMY PERCUTANEOUS;  Surgeon: Franchot Gallo, MD;  Location: [facility];  Service: Urology;  Laterality: Right;  . removal of growth  2007   growth under kidney removed and possibly kindey stones removed  . TOTAL SHOULDER ARTHROPLASTY Right 10/2007  . URETERAL STENT PLACEMENT Bilateral    for stenosis; "they've been removed"    Allergies as of 01/29/2017      Reactions   Sulfa Antibiotics Rash   Zocor [simvastatin] Other (See Comments)   Old allergy. Unsure reaction      Medication List       Accurate as of 01/29/17  9:29 AM. Always use your most recent med list.          acetaminophen 500 MG tablet Commonly known as:  TYLENOL Take 500 mg by mouth at bedtime.   cefUROXime 500 MG tablet Commonly known as:  CEFTIN Take 1 tablet (500 mg total) by mouth 2 (two) times daily with a meal.   cholecalciferol 1000 units tablet Commonly known as:  VITAMIN D Take 1,000 Units by mouth every  morning.   CRANBERRY PO Take 1 tablet by mouth every morning.   diazepam 5 MG tablet Commonly known as:  VALIUM Take 0.5-2.5 mg by mouth every 12 (twelve) hours as needed for anxiety.   digoxin 0.125 MG tablet Commonly known as:  LANOXIN Take 0.125 mg by mouth every Monday, Wednesday, and Friday.   divalproex 250 MG DR tablet Commonly known as:  DEPAKOTE Take 250 mg by mouth 2 (two) times daily.   feeding supplement Liqd Take 1 Container by mouth 3 (three) times daily between meals.   FISH OIL PO Take 1 capsule by mouth every morning.   metoprolol tartrate 25 MG tablet Commonly known as:  LOPRESSOR Take 0.5 tablets (12.5 mg total) by mouth daily.   mirtazapine 15 MG tablet Commonly known as:  REMERON Take 15 mg by mouth at bedtime.   NAMZARIC 28-10 MG Cp24 Generic drug:  Memantine HCl-Donepezil HCl Take 1 capsule by mouth each night  TO REPLACE DONEPEZIL   polyethylene glycol packet Commonly known as:  MIRALAX / GLYCOLAX Take 17 g by  mouth daily. Hold for diarrhea   rosuvastatin 5 MG tablet Commonly known as:  CRESTOR Take 2.5 mg by mouth at bedtime.   senna-docusate 8.6-50 MG tablet Commonly known as:  Senokot-S Take 1 tablet by mouth 2 (two) times daily.   vitamin C 500 MG tablet Commonly known as:  ASCORBIC ACID Take 500 mg by mouth every morning.   XARELTO 20 MG Tabs tablet Generic drug:  rivaroxaban Take 20 mg by mouth at bedtime.       Meds ordered this encounter  Medications  . diazepam (VALIUM) 5 MG tablet    Sig: Take 0.5-2.5 mg by mouth every 12 (twelve) hours as needed for anxiety.    Immunization History  Administered Date(s) Administered  . Influenza Split 09/16/2014  . PPD Test 01/28/2017  . Pneumococcal-Unspecified 10/07/2013    Social History  Substance Use Topics  . Smoking status: Former Smoker    Packs/day: 0.25    Years: 5.00    Types: Cigarettes    Quit date: 07/20/1981  . Smokeless tobacco: Never Used  . Alcohol use No     Family history is   Family History  Problem Relation Age of Onset  . Heart disease Mother   . Heart attack Mother 7      Review of Systems  DATA OBTAINED: from daughter GENERAL:  no fevers, fatigue, appetite is a problem SKIN: No itching, or rash EYES: No eye pain, redness, discharge EARS: No earache, tinnitus, change in hearing NOSE: No congestion, drainage or bleeding  MOUTH/THROAT: No mouth or tooth pain, No sore throat RESPIRATORY: No cough, wheezing, SOB CARDIAC: No chest pain, palpitations, lower extremity edema  GI: No abdominal pain, No N/V/D or constipation, No heartburn or reflux  GU: No dysuria, frequency or urgency, or incontinence  MUSCULOSKELETAL: No unrelieved bone/joint pain NEUROLOGIC: No headache, dizziness or focal weakness PSYCHIATRIC: No c/o anxiety or sadness   Vitals:   01/29/17 0913  BP: 130/72  Pulse: 65  Resp: 20  Temp: 97.6 F (36.4 C)    SpO2 Readings from Last 1 Encounters:  01/29/17 92%   Body mass index is 19.98 kg/m.     Physical Exam  GENERAL APPEARANCE: Alert,min conversant,  No acute distress.  SKIN: No diaphoresis rash HEAD: Normocephalic, atraumatic  EYES: Conjunctiva/lids clear. Pupils round, reactive. EOMs intact.  EARS: External exam WNL, canals clear. Hearing grossly normal.  NOSE: No deformity or discharge.  MOUTH/THROAT: Lips w/o lesions  RESPIRATORY: Breathing is even, unlabored. Lung sounds are clear   CARDIOVASCULAR: Heart RRR no murmurs, rubs or gallops. No peripheral edema.   GASTROINTESTINAL: Abdomen is soft, non-tender, not distended w/ normal bowel sounds. GENITOURINARY: Bladder non tender, not distended  MUSCULOSKELETAL: No abnormal joints or musculature NEUROLOGIC:  Cranial nerves 2-12 grossly intact. Moves all extremities  PSYCHIATRIC: Mood and affect with dementia, no behavioral issues  Patient Active Problem List   Diagnosis Date Noted  . Physical deconditioning   . Protein-calorie  malnutrition, severe 01/25/2017  . Obstructive uropathy   . Renal mass   . Lactic acidosis 01/23/2017  . Diastolic heart failure ([place]) 01/23/2017  . Kidney stone 07/21/2015  . Calculus of ureter 06/24/2015  . Kidney stones 10/07/2014  . Renal calculus, bilateral 08/26/2014  . Severe sepsis(995.92) 07/15/2014  . Bacteremia due to Gram-negative bacteria 07/14/2014  . Septic shock ([place]) 07/12/2014  . Acute respiratory failure ([place]) 07/12/2014  . Hypokalemia 07/12/2014  . Pyelonephritis 07/12/2014  . Left humeral fracture 05/19/2014  . Dementia without  behavioral disturbance 04/05/2014  . Acute blood loss anemia 04/05/2014  . Hypertension 04/05/2014  . Depression 04/05/2014  . Humerus fracture 03/31/2014    Class: Acute  . Syncope 03/31/2014  . Bradycardia 01/14/2013  . UTI (urinary tract infection) 01/13/2013    Class: Acute  . Syncope and collapse 05/28/2012  . Weight loss, unintentional 03/04/2012  . Inguinal hernia 09/25/2011  . Dizziness - light-headed 06/27/2011  . Malaise and fatigue 03/23/2011  . Right bundle branch block 03/23/2011  . Hypercholesterolemia 03/23/2011  . Atrial fibrillation ([place]) 02/26/2011  . Long term current use of anticoagulant 02/26/2011      Labs reviewed: Basic Metabolic Panel:    Component Value Date/Time   NA 142 01/28/2017 0513   NA 142 01/28/2017   K 3.6 01/28/2017 0513   CL 105 01/28/2017 0513   CO2 28 01/28/2017 0513   GLUCOSE 80 01/28/2017 0513   BUN 17 01/28/2017 0513   BUN 17 01/28/2017   CREATININE 0.75 01/28/2017 0513   CREATININE 0.84 02/29/2016 1435   CALCIUM 10.1 01/28/2017 0513   PROT 6.4 (L) 01/23/2017 1519   ALBUMIN 3.0 (L) 01/23/2017 1519   AST 25 01/23/2017 1519   ALT <5 (L) 01/23/2017 1519   ALKPHOS 68 01/23/2017 1519   BILITOT 1.4 (H) 01/23/2017 1519   GFRNONAA >60 01/28/2017 0513   GFRAA >60 01/28/2017 0513     Recent Labs  01/23/17 1519 01/23/17 2307  01/24/17 0600 01/25/17 01/25/17 0600 01/28/17  01/28/17 0513  NA 144  --   < > 145 144 144 142 142  K 4.1  --   < > 3.2* 3.9 3.9  --  3.6  CL 106  --   --  113*  --  114*  --  105  CO2 30  --   --  24  --  25  --  28  GLUCOSE 202*  --   --  90  --  81  --  80  BUN 19  --   < > 15 18 18 17 17   CREATININE 0.93  --   < > 0.68 0.8 0.79 0.8 0.75  CALCIUM 9.6  --   --  9.0  --  9.1  --  10.1  MG 1.8 1.7  --   --   --  1.6*  --   --   PHOS  --  2.7  --   --   --   --   --   --   < > = values in this interval not displayed. Liver Function Tests:  Recent Labs  01/23/17 1519  AST 25  ALT <5*  ALKPHOS 68  BILITOT 1.4*  PROT 6.4*  ALBUMIN 3.0*   No results for input(s): LIPASE, AMYLASE in the last 8760 hours. No results for input(s): AMMONIA in the last 8760 hours. CBC:  Recent Labs  01/23/17 1519  01/24/17 0600 01/25/17 01/25/17 0600 01/28/17 01/28/17 0513  WBC 9.0  < > 8.5 6.3 6.3 6.5 6.5  NEUTROABS 7.7  --   --   --   --   --   --   HGB 12.5  < > 10.7* 10.4* 10.4*  --  12.2  HCT 39.5  < > 32.7* 32* 32.3*  --  38.4  MCV 95.4  --  94.5  --  94.7  --  93.4  PLT 202  < > 171 162 162  --  181  < > = values in this interval  not displayed. Lipid No results for input(s): CHOL, HDL, LDLCALC, TRIG in the last 8760 hours.  Cardiac Enzymes: No results for input(s): CKTOTAL, CKMB, CKMBINDEX, TROPONINI in the last 8760 hours. BNP: No results for input(s): BNP in the last 8760 hours. No results found for: MICROALBUR No results found for: HGBA1C Lab Results  Component Value Date   TSH 5.133 (H) 01/23/2017   Lab Results  Component Value Date   [id number] 607 01/23/2017   No results found for: FOLATE No results found for: IRON, TIBC, FERRITIN  Imaging and Procedures obtained prior to SNF admission: Dg Chest 2 View  Result Date: 01/23/2017 CLINICAL DATA:  Syncope x2 today. EXAM: CHEST  2 VIEW COMPARISON:  Single-view of the chest 01/08/2017. PA and lateral chest 06/24/2015. FINDINGS: The lungs are clear. There is cardiomegaly.  No pneumothorax or pleural effusion. Aortic atherosclerosis is seen. No acute bony abnormality. The patient is status post right shoulder replacement. IMPRESSION: Cardiomegaly without acute disease. Electronically Signed   By: Inge Rise M.D.   On: 01/23/2017 17:06   Ct Renal Stone Study  Result Date: 01/23/2017 CLINICAL DATA:  83 year old female with history of nausea and syncope. Recurrent urinary tract infections. EXAM: CT ABDOMEN AND PELVIS WITHOUT CONTRAST TECHNIQUE: Multidetector CT imaging of the abdomen and pelvis was performed following the standard protocol without IV contrast. COMPARISON:  CT the abdomen and pelvis 07/12/2014. FINDINGS: Lower chest: Scarring in the left lower lobe. Mild cardiomegaly. Atherosclerotic calcifications in the left anterior descending, left circumflex and right coronary arteries. Hepatobiliary: Calcified granuloma in the liver again noted. Several small well-defined low-attenuation lesions are again noted scattered throughout the liver, incompletely characterized on today's noncontrast CT examination, but similar to the prior study, presumably small cysts, largest of which measures up to 1.8 cm in segment 6. Gallbladder is normal in appearance. Pancreas: Extending off the inferior aspect of the body of the pancreas there is a well-circumscribed 4.3 x 2.4 x 3.6 cm low-attenuation lesion (axial image 27 of series 2 and sagittal image 113 of series 4) which is new compared to the prior examination, and incompletely characterized on today's noncontrast CT examination, but favored to represent a pancreatic pseudocyst (this measures only -27 HU, suggesting that it contains fluid with some saponified fat). Spleen: Unremarkable. Adrenals/Urinary Tract: 11 mm calculus in the proximal third of the left upper pole ureter (left renal collecting system and proximal ureters are duplicated) shortly beyond the left ureteropelvic junction. This is associated with mild proximal left  hydroureteronephrosis involving the upper pole moiety. No additional calculi are noted within the right renal collecting system, elsewhere along the course of either ureter, or within the lumen of the urinary bladder. Right kidney and bilateral adrenal glands are normal in appearance. Left kidney is partially distorted by a mass. It is uncertain whether not this mass arises from the left kidney or is adjacent to the left kidney. Previous studies have demonstrated a partially calcified soft tissue mass in this region, however, this mass appears anteriorly displaced by a new larger intermediate attenuation mass on today's examination. Specifically, the new mass is favored to arise from the lower pole of the left kidney and measures 7.2 x 9.4 x 9.9 cm (axial image 43 of series 2 and coronal image 58 of series 3) and measures intermediate attenuation (40 HU). There is also a large low-attenuation perinephric fluid collection posterior to the left kidney which measures 4.6 x 0.3 x 6.7 cm (axial image 39 of series 2 and coronal  image 78 of series 3). Unenhanced appearance of the urinary bladder is normal. Stomach/Bowel: Appearance of the stomach is normal. There is no pathologic dilatation of small bowel or colon. Normal appendix. Vascular/Lymphatic: Aortic atherosclerosis, without definite aneurysm in the abdominal or pelvic vasculature. No definite lymphadenopathy noted in the abdomen or pelvis on today's noncontrast CT examination. Reproductive: Status post hysterectomy. Ovaries are not confidently identified may be surgically absent or atrophic. Other: Previously described soft tissue mass in the left side of the abdomen is similar in size to prior examinations, currently measuring 2.9 x 5.5 x 4.7 cm (axial image 43 of series 2 and coronal image 39 of series 3), but has been displaced anteriorly by the large left-sided renal or perirenal mass discussed above. Musculoskeletal: Multiple chronic sclerotic lesions are  again noted throughout the pelvis, similar to prior examinations, presumably benign. No other new aggressive appearing lytic or blastic lesions are noted in the visualized portions of the skeleton. IMPRESSION: 1. 11 mm calculus in the proximal third of the left ureter from the upper pole moiety of the patient's left kidney (duplicated left renal collecting system and proximal ureters), with mild proximal left hydroureteronephrosis indicative of at least partial obstruction. 2. Large mass in the left side of the retroperitoneum which appears intimately associated with the lower pole of the left kidney and is favored to represent a primary renal neoplasm such as a renal cell carcinoma. This may alternatively represent a large hemorrhage, however, that is not favored. This could be further delineated with MRI of the abdomen with and without IV gadolinium if clinically appropriate. 3. New low-attenuation (-27 HU) cystic appearing lesion extending off the inferior aspect of the mid body of the pancreas, favored to represent a pancreatic pseudocyst. This too could be better evaluated with followup MRI of the abdomen with and without IV gadolinium. 4. Large left-sided low-attenuation perinephric fluid collection could represent a urinoma in the setting of an obstructed upper pole collecting system. 5. Aortic atherosclerosis, in addition to at least 3 vessel coronary artery disease. 6. Cardiomegaly. 7. Additional incidental findings, as above. Electronically Signed   By: Vinnie Langton M.D.   On: 01/23/2017 19:41     Not all labs, radiology exams or other studies done during hospitalization come through on my EPIC note; however they are reviewed by me.    Assessment and Plan  SYNCOPE/ LACTIC ACIDOSIS/ OBSTRUCTIVE UROPATHY - most likely associated with dehydration from UTI infection/poor PO intake; and also vasovagal events, as patient appears to have fecal impaction vs probably problems with micturition.. -no  abnormalities seen on telemetry.  -continue tx for UTI; now transitioning to PO ceftin and looking for a total of 14 days of treatment. -no fecal impaction seen on CT scan now; but patient with stool content in her colon. Patient started on bowel regimen and has experienced 2 BM's during hospitalization.  -TSH (slightly elevated), Mg and Phosphorus levels WNL -positive CT for obstructive uropathy; urology on board and s/p left ureter stenting  resolved with IVF's; last lactic level 1.8 (WNL) SNF - admitted for generalized weakness for OT/PT  E COLI UTI - tx with IVF and IV abx then ceftin; complicated by obstructive uropathy SNF - cont Ceftin for 14 days  CHRONIC AF -  SNF - stable and rate controlled;cont metoprolol 12.5 mg daily, dig 0.125 mg MWF and xarelto 20 mg daily  DIASTOLIC CHF SNF - chromic and compensated; cont low Na+ diet, weights and metoprolol 12.5 mg  HTN SNF -  stable on metoprolol 12.5 mg BID  HLD SNF - stable; cont cont crestor 5 mg daily and fish oil 1 gm daily  ELEVATED TSH SNF-recheck TSH, free T4  L RENAL MASS - -S/P MRI confirming RCC on left kidney -no metastasis seen on MRI -will follow up with urology  SNF - daughter has told me family will not be pursuing; I told her they should at least go to urology to get the big picture;there may be something easy they can do    Time spent > 45 min;> 50% of time with patient was spent reviewing records, labs, tests and studies, counseling and developing plan of care  Webb Silversmith D. Sheppard Coil, MD

## 2017-02-01 LAB — CBC AND DIFFERENTIAL
HEMATOCRIT: 41 % (ref 36–46)
HEMATOCRIT: 41 % (ref 36–46)
Hemoglobin: 13.1 g/dL (ref 12.0–16.0)
Hemoglobin: 13.1 g/dL (ref 12.0–16.0)
PLATELETS: 205 10*3/uL (ref 150–399)
Platelets: 205 10*3/uL (ref 150–399)
WBC: 7.9 10*3/mL
WBC: 7.9 10*3/mL

## 2017-02-01 LAB — BASIC METABOLIC PANEL
BUN: 24 mg/dL — AB (ref 4–21)
Creatinine: 0.8 mg/dL (ref 0.5–1.1)
GLUCOSE: 75 mg/dL
Potassium: 3.7 mmol/L (ref 3.4–5.3)
SODIUM: 143 mmol/L (ref 137–147)

## 2017-02-02 ENCOUNTER — Encounter: Payer: Self-pay | Admitting: Internal Medicine

## 2017-02-03 DIAGNOSIS — R7989 Other specified abnormal findings of blood chemistry: Secondary | ICD-10-CM | POA: Insufficient documentation

## 2017-02-12 ENCOUNTER — Non-Acute Institutional Stay (SKILLED_NURSING_FACILITY): Payer: Medicare Other | Admitting: Internal Medicine

## 2017-02-12 ENCOUNTER — Encounter: Payer: Self-pay | Admitting: Internal Medicine

## 2017-02-12 DIAGNOSIS — R05 Cough: Secondary | ICD-10-CM | POA: Diagnosis not present

## 2017-02-12 DIAGNOSIS — J069 Acute upper respiratory infection, unspecified: Secondary | ICD-10-CM | POA: Diagnosis not present

## 2017-02-12 DIAGNOSIS — R059 Cough, unspecified: Secondary | ICD-10-CM

## 2017-02-12 NOTE — Progress Notes (Signed)
Location:  [facility] Room Number: [phone] Place of Service:  [phone]  Penny [name] Sheppard Coil, MD  Patient Care Team: Marton Redwood, MD as PCP - General (Internal Medicine)  Extended Emergency Contact Information Primary Emergency Contact: Penny [name], [place] [address] Penny [name] of [place] Phone: [phone] Mobile Phone: [phone] Relation: Daughter Secondary Emergency Contact: Penny [name],  [address] Penny [name] of [place] Mobile Phone: [phone] Relation: Daughter    Allergies: Sulfa antibiotics and Zocor [simvastatin]  Chief Complaint  Patient presents with  . Acute Visit    Acute     HPI: Patient is 83 y.o. female who nursing has asked me to see because pt has a cough onset yesterday, with ST. Today phlegm reported as clear with runny nose and HA; vomited once, no diarrhea, no fever.  Past Medical History:  Diagnosis Date  . Alzheimer's dementia 03/31/2014  . Anginal pain ([place])   . Anxiety   . Arthritis    "joints" (03/31/2014)  . Atrial fibrillation ([place])   . Blood in urine    "chronic" (03/31/2014)  . CHF (congestive heart failure) ([place])   . Chronic shoulder pain   . Depression   . Dyslipidemia   . Dysrhythmia    atrail fib   . Heart palpitations    rare  . Humerus fracture 03/31/2014   "fell and broke left arm"  . Hx of transfusion of packed red blood cells   . Hyperlipidemia   . Hypertension   . Kidney stones   . Long-term (current) use of anticoagulants   . Memory problem    "a little short term and long term memory loss" (03/31/2014)  . Osteopenia    "severe" (03/31/2014)  . Seasonal allergies   . Syncope and collapse 03/31/2014    Past Surgical History:  Procedure Laterality Date  . APPENDECTOMY  ~ 1953  . BLADDER SUSPENSION     w/hysterectomy  . CARDIOVERSION  2006  . CYSTOSCOPY W/ URETERAL STENT PLACEMENT Left 01/25/2017   Procedure: CYSTOSCOPY WITH LEFT  RETROGRADE  PYELOGRAM/URETERAL STENT PLACEMENT;  Surgeon: Franchot Gallo, MD;  Location: [facility];  Service: Urology;  Laterality: Left;  . CYSTOSCOPY WITH RETROGRADE PYELOGRAM, URETEROSCOPY AND STENT PLACEMENT Bilateral 08/26/2014   Procedure: CYSTOSCOPY WITH RETROGRADE PYELOGRAM, URETEROSCOPY , EXTRACTION OF STONES AND STENT PLACEMENT;  Surgeon: Jorja Loa, MD;  Location: [facility];  Service: Urology;  Laterality: Bilateral;  . CYSTOSCOPY WITH STENT PLACEMENT Right 06/24/2015   Procedure: CYSTOSCOPY URETEROSCOPY, PYLEOGRAM, WITH STENT PLACEMENT X2 ;  Surgeon: Franchot Gallo, MD;  Location: [facility];  Service: Urology;  Laterality: Right;  . CYSTOSCOPY WITH URETEROSCOPY, STONE BASKETRY AND STENT PLACEMENT Bilateral 07/21/2015   Procedure: CYSTOSCOPY WITH  right URETEROSCOPY, STONE BASKETRY bilateral pylegram  removal bilateral double j stents;  Surgeon: Franchot Gallo, MD;  Location: [facility];  Service: Urology;  Laterality: Bilateral;  . CYSTOSCOPY/RETROGRADE/URETEROSCOPY/STONE EXTRACTION WITH BASKET Right 10/08/2014   Procedure: CYSTOSCOPY, retrograde pyelogram,URETEROSCOPY, laser treatment of calculus, stent exchange;  Surgeon: Jorja Loa, MD;  Location: [facility];  Service: Urology;  Laterality: Right;  . HOLMIUM LASER APPLICATION Left 62/01/7627   Procedure: HOLMIUM LASER APPLICATION;  Surgeon: Jorja Loa, MD;  Location: [facility];  Service: Urology;  Laterality: Left;  . HOLMIUM LASER APPLICATION Right 01/18/5175   Procedure: HOLMIUM LASER APPLICATION;  Surgeon: Franchot Gallo, MD;  Location: [facility];  Service: Urology;  Laterality: Right;  . KNEE ARTHROSCOPY Right 2011  . MENISCUS REPAIR Left 07/2000  . NEPHROLITHOTOMY Right 06/04/2013   Procedure: NEPHROLITHOTOMY PERCUTANEOUS;  Surgeon: Franchot Gallo, MD;  Location: [facility];  Service: Urology;  Laterality: Right;  . removal of growth  2007   growth under kidney removed and possibly kindey stones removed  . TOTAL SHOULDER ARTHROPLASTY  Right 10/2007  . URETERAL STENT PLACEMENT Bilateral    for stenosis; "they've been removed"    Allergies as of 02/12/2017      Reactions   Sulfa Antibiotics Rash   Zocor [simvastatin] Other (See Comments)   Old allergy. Unsure reaction      Medication List       Accurate as of 02/12/17 11:59 PM. Always use your most recent med list.          acetaminophen 500 MG tablet Commonly known as:  TYLENOL Take 500 mg by mouth at bedtime.   cholecalciferol 1000 units tablet Commonly known as:  VITAMIN D Take 1,000 Units by mouth every morning.   CRANBERRY PO Take 1 tablet by mouth every morning.   diazepam 5 MG tablet Commonly known as:  VALIUM Take 0.5-2.5 mg by mouth every 12 (twelve) hours as needed for anxiety.   digoxin 0.125 MG tablet Commonly known as:  LANOXIN Take 0.125 mg by mouth every Monday, Wednesday, and Friday.   divalproex 250 MG DR tablet Commonly known as:  DEPAKOTE Take 250 mg by mouth 2 (two) times daily.   feeding supplement Liqd Take 1 Container by mouth 3 (three) times daily between meals.   FISH OIL PO Take 1 capsule by mouth every morning.   metoprolol tartrate 25 MG tablet Commonly known as:  LOPRESSOR Take 0.5 tablets (12.5 mg total) by mouth daily.   mirtazapine 15 MG tablet Commonly known as:  REMERON Take 15 mg by mouth at bedtime.   NAMZARIC 28-10 MG Cp24 Generic drug:  Memantine HCl-Donepezil HCl Take 1 capsule by mouth each night  TO REPLACE DONEPEZIL   polyethylene glycol packet Commonly known as:  MIRALAX / GLYCOLAX Take 17 g by mouth daily. Hold for diarrhea   rosuvastatin 5 MG tablet Commonly known as:  CRESTOR Take 2.5 mg by mouth at bedtime.   senna-docusate 8.6-50 MG tablet Commonly known as:  Senokot-S Take 1 tablet by mouth 2 (two) times daily.   vitamin C 500 MG tablet Commonly known as:  ASCORBIC ACID Take 500 mg by mouth every morning.   XARELTO 20 MG Tabs tablet Generic drug:  rivaroxaban Take 20 mg by  mouth at bedtime.       No orders of the defined types were placed in this encounter.   Immunization History  Administered Date(s) Administered  . Influenza Split 09/16/2014  . PPD Test 01/28/2017  . Pneumococcal-Unspecified 10/07/2013    Social History  Substance Use Topics  . Smoking status: Former Smoker    Packs/day: 0.25    Years: 5.00    Types: Cigarettes    Quit date: 07/20/1981  . Smokeless tobacco: Never Used  . Alcohol use No    Review of Systems  DATA OBTAINED: from patient- can not fully participate; nurse - as per HPI GENERAL:  no fevers,+fatigue, appetite changes SKIN: No itching, rash HEENT: ST, rhinorrhea RESPIRATORY: + cough,no wheezing, no SOB CARDIAC: No chest pain, palpitations, lower extremity edema  GI: No abdominal pain, No D or constipation, No heartburn or reflux ; vomited once GU:  No dysuria, frequency or urgency, or incontinence  MUSCULOSKELETAL: No unrelieved bone/joint pain NEUROLOGIC: + headache, no dizziness  PSYCHIATRIC: No overt anxiety or sadness  Vitals:   02/12/17 1555  BP: 112/77  Pulse: 95  Resp: 18  Temp: 98 F (36.7 C)   Body mass index is 20.14 kg/m. Physical Exam  GENERAL APPEARANCE: Alert, min conversant, No acute distress  SKIN: No diaphoresis rash HEENT: Unremarkable RESPIRATORY: Breathing is even, unlabored. Lung sounds are clear   CARDIOVASCULAR: Heart irreg no murmurs, rubs or gallops. No peripheral edema  GASTROINTESTINAL: Abdomen is soft, non-tender, not distended w/ normal bowel sounds.  GENITOURINARY: Bladder non tender, not distended  MUSCULOSKELETAL: No abnormal joints or musculature NEUROLOGIC: Cranial nerves 2-12 grossly intact. Moves all extremities PSYCHIATRIC: Mood and affect with dementia, no behavioral issues  Patient Active Problem List   Diagnosis Date Noted  . Elevated TSH 02/03/2017  . Physical deconditioning   . Protein-calorie malnutrition, severe 01/25/2017  . Obstructive uropathy     . Renal mass   . Lactic acidosis 01/23/2017  . Diastolic heart failure ([place]) 01/23/2017  . Kidney stone 07/21/2015  . Calculus of ureter 06/24/2015  . Kidney stones 10/07/2014  . Renal calculus, bilateral 08/26/2014  . Severe sepsis(995.92) 07/15/2014  . Bacteremia due to Gram-negative bacteria 07/14/2014  . Septic shock ([place]) 07/12/2014  . Acute respiratory failure ([place]) 07/12/2014  . Hypokalemia 07/12/2014  . Pyelonephritis 07/12/2014  . Left humeral fracture 05/19/2014  . Dementia without behavioral disturbance 04/05/2014  . Acute blood loss anemia 04/05/2014  . Hypertension 04/05/2014  . Depression 04/05/2014  . Humerus fracture 03/31/2014    Class: Acute  . Syncope 03/31/2014  . Bradycardia 01/14/2013  . E. coli UTI 01/13/2013    Class: Acute  . Syncope and collapse 05/28/2012  . Weight loss, unintentional 03/04/2012  . Inguinal hernia 09/25/2011  . Dizziness - light-headed 06/27/2011  . Malaise and fatigue 03/23/2011  . Right bundle branch block 03/23/2011  . Hypercholesterolemia 03/23/2011  . Atrial fibrillation ([place]) 02/26/2011  . Long term current use of anticoagulant 02/26/2011    CMP     Component Value Date/Time   NA 143 02/01/2017   K 3.7 02/01/2017   CL 105 01/28/2017 0513   CO2 28 01/28/2017 0513   GLUCOSE 80 01/28/2017 0513   BUN 24 (A) 02/01/2017   CREATININE 0.8 02/01/2017   CREATININE 0.75 01/28/2017 0513   CREATININE 0.84 02/29/2016 1435   CALCIUM 10.1 01/28/2017 0513   PROT 6.4 (L) 01/23/2017 1519   ALBUMIN 3.0 (L) 01/23/2017 1519   AST 25 01/23/2017 1519   ALT <5 (L) 01/23/2017 1519   ALKPHOS 68 01/23/2017 1519   BILITOT 1.4 (H) 01/23/2017 1519   GFRNONAA >60 01/28/2017 0513   GFRAA >60 01/28/2017 0513    Recent Labs  01/23/17 1519 01/23/17 2307  01/24/17 0600  01/25/17 0600 01/28/17 01/28/17 0513 02/01/17  NA 144  --   < > 145  < > 144 142 142 143  K 4.1  --   < > 3.2*  < > 3.9  --  3.6 3.7  CL 106  --   --  113*  --  114*   --  105  --   CO2 30  --   --  24  --  25  --  28  --   GLUCOSE 202*  --   --  90  --  81  --  80  --   BUN 19  --   < >  15  < > 18 17 17  24*  CREATININE 0.93  --   < > 0.68  < > 0.79 0.8 0.75 0.8  CALCIUM 9.6  --   --  9.0  --  9.1  --  10.1  --   MG 1.8 1.7  --   --   --  1.6*  --   --   --   PHOS  --  2.7  --   --   --   --   --   --   --   < > = values in this interval not displayed.  Recent Labs  01/23/17 1519  AST 25  ALT <5*  ALKPHOS 68  BILITOT 1.4*  PROT 6.4*  ALBUMIN 3.0*    Recent Labs  01/23/17 1519  01/24/17 0600  01/25/17 0600 01/28/17 01/28/17 0513 02/01/17  WBC 9.0  < > 8.5  < > 6.3 6.5 6.5 7.9  7.9  NEUTROABS 7.7  --   --   --   --   --   --   --   HGB 12.5  < > 10.7*  < > 10.4*  --  12.2 13.1  13.1  HCT 39.5  < > 32.7*  < > 32.3*  --  38.4 41  41  MCV 95.4  --  94.5  --  94.7  --  93.4  --   PLT 202  < > 171  < > 162  --  181 205  205  < > = values in this interval not displayed. No results for input(s): CHOL, LDLCALC, TRIG in the last 8760 hours.  Invalid input(s): HCL No results found for: [facility] Lab Results  Component Value Date   TSH 5.133 (H) 01/23/2017   No results found for: [facility] Lab Results  Component Value Date   CHOL 166 02/26/2011   HDL 74.20 02/26/2011   LDLCALC 79 02/26/2011   TRIG 63.0 02/26/2011   CHOLHDL 2 02/26/2011    Significant Diagnostic Results in last 30 days:  Dg Chest 2 View  Result Date: 01/23/2017 CLINICAL DATA:  Syncope x2 today. EXAM: CHEST  2 VIEW COMPARISON:  Single-view of the chest 01/08/2017. PA and lateral chest 06/24/2015. FINDINGS: The lungs are clear. There is cardiomegaly. No pneumothorax or pleural effusion. Aortic atherosclerosis is seen. No acute bony abnormality. The patient is status post right shoulder replacement. IMPRESSION: Cardiomegaly without acute disease. Electronically Signed   By: Inge Rise M.D.   On: 01/23/2017 17:06   Mr Abdomen W Wo Contrast  Result Date:  01/25/2017 CLINICAL DATA:  Evaluate large left-sided retroperitoneal mass seen on recent CT scan. EXAM: MRI ABDOMEN WITHOUT AND WITH CONTRAST TECHNIQUE: Multiplanar multisequence MR imaging of the abdomen was performed both before and after the administration of intravenous contrast. CONTRAST:  41mL MULTIHANCE GADOBENATE DIMEGLUMINE 529 MG/ML IV SOLN COMPARISON:  CT scan 01/23/2017 and prior study from 07/12/2014 FINDINGS: Lower chest: Knee heart is mildly enlarged. No pericardial effusion. Persistent right basilar atelectasis and bibasilar scarring. No obvious pulmonary lesions. No pleural or pericardial effusion. Hepatobiliary: Numerous benign hepatic cysts several of which are septated. No worrisome hepatic lesions or intrahepatic biliary dilatation. The gallbladder is grossly normal. The no common bile duct dilatation. Pancreas: No mass, inflammation or ductal dilatation. Mild atrophy. As demonstrated on the CT scan there is a cystic lesion located chest inferior to the body region of the pancreas. This measures 4.0 x 2.5 cm. No enhancement is demonstrated. This is  likely pseudocyst or benign peritoneal inclusion cyst. Spleen:  Normal size.  No focal lesions. Adrenals/Urinary Tract:  The adrenal glands are unremarkable. The 8 right kidney is unremarkable and stable. Duplicated collecting system again demonstrated. There is a large mass with cystic and solid components involving the lower pole moiety of the duplicated left kidney. The solid component measures 10 x 9 cm and demonstrates extensive enhancement. The more posterior complex cystic component measures 8.4 x 8.4 cm and is septated and may contain some debris. The upper pole moiety of the left kidney demonstrates persistent hydronephrosis with an obstructing ureteral calculus. There are few small scattered left-sided retroperitoneal lymph nodes without overt adenopathy. The largest node measures 12 x 5.5 mm. Stomach/Bowel: Visualized portions within the  abdomen are unremarkable. Duodenum diverticuli are noted. Vascular/Lymphatic: Scattered small mesenteric and retroperitoneal lymph nodes but no overt adenopathy. The left renal vein is patent. Other: No ascites or abdominal wall hernia. No subcutaneous lesions. There is a stable solid mesenteric mass located just anterior to the left kidney. The this measures 5.2 x 2.8 cm and is unchanged since 2015. A contains a few calcifications and is relatively dark on T2. No contrast enhancement. Musculoskeletal: No significant bony findings. IMPRESSION: 1. Large cystic and solid mass lesion associated with the lower pole moiety of the left kidney most consistent with renal cell carcinoma. No overt adenopathy or left renal vein involvement. No findings to suggest metastatic disease. 2. Stable multiple hepatic cysts. 3. Benign-appearing fluid collection just inferior to the pancreatic body, likely pseudocyst. 4. Stable benign left-sided mesenteric mass since 2015. No worrisome MR imaging features. 5. Stable hydronephrosis of the upper pole moiety of the left kidney with an obstructing ureteral calculus. Electronically Signed   By: Marijo Sanes M.D.   On: 01/25/2017 09:35   Ct Renal Stone Study  Result Date: 01/23/2017 CLINICAL DATA:  83 year old female with history of nausea and syncope. Recurrent urinary tract infections. EXAM: CT ABDOMEN AND PELVIS WITHOUT CONTRAST TECHNIQUE: Multidetector CT imaging of the abdomen and pelvis was performed following the standard protocol without IV contrast. COMPARISON:  CT the abdomen and pelvis 07/12/2014. FINDINGS: Lower chest: Scarring in the left lower lobe. Mild cardiomegaly. Atherosclerotic calcifications in the left anterior descending, left circumflex and right coronary arteries. Hepatobiliary: Calcified granuloma in the liver again noted. Several small well-defined low-attenuation lesions are again noted scattered throughout the liver, incompletely characterized on today's  noncontrast CT examination, but similar to the prior study, presumably small cysts, largest of which measures up to 1.8 cm in segment 6. Gallbladder is normal in appearance. Pancreas: Extending off the inferior aspect of the body of the pancreas there is a well-circumscribed 4.3 x 2.4 x 3.6 cm low-attenuation lesion (axial image 27 of series 2 and sagittal image 113 of series 4) which is new compared to the prior examination, and incompletely characterized on today's noncontrast CT examination, but favored to represent a pancreatic pseudocyst (this measures only -27 HU, suggesting that it contains fluid with some saponified fat). Spleen: Unremarkable. Adrenals/Urinary Tract: 11 mm calculus in the proximal third of the left upper pole ureter (left renal collecting system and proximal ureters are duplicated) shortly beyond the left ureteropelvic junction. This is associated with mild proximal left hydroureteronephrosis involving the upper pole moiety. No additional calculi are noted within the right renal collecting system, elsewhere along the course of either ureter, or within the lumen of the urinary bladder. Right kidney and bilateral adrenal glands are normal in appearance. Left kidney  is partially distorted by a mass. It is uncertain whether not this mass arises from the left kidney or is adjacent to the left kidney. Previous studies have demonstrated a partially calcified soft tissue mass in this region, however, this mass appears anteriorly displaced by a new larger intermediate attenuation mass on today's examination. Specifically, the new mass is favored to arise from the lower pole of the left kidney and measures 7.2 x 9.4 x 9.9 cm (axial image 43 of series 2 and coronal image 58 of series 3) and measures intermediate attenuation (40 HU). There is also a large low-attenuation perinephric fluid collection posterior to the left kidney which measures 4.6 x 0.3 x 6.7 cm (axial image 39 of series 2 and coronal  image 78 of series 3). Unenhanced appearance of the urinary bladder is normal. Stomach/Bowel: Appearance of the stomach is normal. There is no pathologic dilatation of small bowel or colon. Normal appendix. Vascular/Lymphatic: Aortic atherosclerosis, without definite aneurysm in the abdominal or pelvic vasculature. No definite lymphadenopathy noted in the abdomen or pelvis on today's noncontrast CT examination. Reproductive: Status post hysterectomy. Ovaries are not confidently identified may be surgically absent or atrophic. Other: Previously described soft tissue mass in the left side of the abdomen is similar in size to prior examinations, currently measuring 2.9 x 5.5 x 4.7 cm (axial image 43 of series 2 and coronal image 39 of series 3), but has been displaced anteriorly by the large left-sided renal or perirenal mass discussed above. Musculoskeletal: Multiple chronic sclerotic lesions are again noted throughout the pelvis, similar to prior examinations, presumably benign. No other new aggressive appearing lytic or blastic lesions are noted in the visualized portions of the skeleton. IMPRESSION: 1. 11 mm calculus in the proximal third of the left ureter from the upper pole moiety of the patient's left kidney (duplicated left renal collecting system and proximal ureters), with mild proximal left hydroureteronephrosis indicative of at least partial obstruction. 2. Large mass in the left side of the retroperitoneum which appears intimately associated with the lower pole of the left kidney and is favored to represent a primary renal neoplasm such as a renal cell carcinoma. This may alternatively represent a large hemorrhage, however, that is not favored. This could be further delineated with MRI of the abdomen with and without IV gadolinium if clinically appropriate. 3. New low-attenuation (-27 HU) cystic appearing lesion extending off the inferior aspect of the mid body of the pancreas, favored to represent a  pancreatic pseudocyst. This too could be better evaluated with followup MRI of the abdomen with and without IV gadolinium. 4. Large left-sided low-attenuation perinephric fluid collection could represent a urinoma in the setting of an obstructed upper pole collecting system. 5. Aortic atherosclerosis, in addition to at least 3 vessel coronary artery disease. 6. Cardiomegaly. 7. Additional incidental findings, as above. Electronically Signed   By: Vinnie Langton M.D.   On: 01/23/2017 19:41    Assessment and Plan   URI/COUGH- have ordered flu test CXR, BMP, CBC; will monitor pt and wait for results   Penny [name] Sheppard Coil, MD

## 2017-02-13 ENCOUNTER — Encounter: Payer: Self-pay | Admitting: Internal Medicine

## 2017-03-04 ENCOUNTER — Encounter: Payer: Self-pay | Admitting: Internal Medicine

## 2017-03-05 ENCOUNTER — Encounter: Payer: Self-pay | Admitting: Internal Medicine

## 2017-03-05 NOTE — Progress Notes (Signed)
Location:  [place] Room Number: [phone] Place of Service:  [phone])  Noah Delaine. Sheppard Coil, MD  Patient Care Team: Marton Redwood, MD as PCP - General (Internal Medicine)  Extended Emergency Contact Information Primary Emergency Contact: Rosemary Holms, [place] [address] Johnnette Litter of [place] Phone: [phone] Mobile Phone: [phone] Relation: Daughter Secondary Emergency Contact: [name], [place] [address] Johnnette Litter of [place] Mobile Phone: [phone] Relation: Daughter    Allergies: Sulfa antibiotics and Zocor [simvastatin]  Chief Complaint  Patient presents with  . Acute Visit    HPI: Patient is 83 y.o. female who   Past Medical History:  Diagnosis Date  . Alzheimer's dementia 03/31/2014  . Anginal pain ([place])   . Anxiety   . Arthritis    "joints" (03/31/2014)  . Atrial fibrillation ([place])   . Blood in urine    "chronic" (03/31/2014)  . CHF (congestive heart failure) ([place])   . Chronic shoulder pain   . Depression   . Dyslipidemia   . Dysrhythmia    atrail fib   . Heart palpitations    rare  . Humerus fracture 03/31/2014   "fell and broke left arm"  . Hx of transfusion of packed red blood cells   . Hyperlipidemia   . Hypertension   . Kidney stones   . Long-term (current) use of anticoagulants   . Memory problem    "a little short term and long term memory loss" (03/31/2014)  . Osteopenia    "severe" (03/31/2014)  . Seasonal allergies   . Syncope and collapse 03/31/2014    Past Surgical History:  Procedure Laterality Date  . APPENDECTOMY  ~ 1953  . BLADDER SUSPENSION     w/hysterectomy  . CARDIOVERSION  2006  . CYSTOSCOPY W/ URETERAL STENT PLACEMENT Left 01/25/2017   Procedure: CYSTOSCOPY WITH LEFT  RETROGRADE PYELOGRAM/URETERAL STENT PLACEMENT;  Surgeon: Franchot Gallo, MD;  Location: [facility];  Service: Urology;  Laterality: Left;  . CYSTOSCOPY WITH RETROGRADE PYELOGRAM, URETEROSCOPY  AND STENT PLACEMENT Bilateral 08/26/2014   Procedure: CYSTOSCOPY WITH RETROGRADE PYELOGRAM, URETEROSCOPY , EXTRACTION OF STONES AND STENT PLACEMENT;  Surgeon: Jorja Loa, MD;  Location: [facility];  Service: Urology;  Laterality: Bilateral;  . CYSTOSCOPY WITH STENT PLACEMENT Right 06/24/2015   Procedure: CYSTOSCOPY URETEROSCOPY, PYLEOGRAM, WITH STENT PLACEMENT X2 ;  Surgeon: Franchot Gallo, MD;  Location: [facility];  Service: Urology;  Laterality: Right;  . CYSTOSCOPY WITH URETEROSCOPY, STONE BASKETRY AND STENT PLACEMENT Bilateral 07/21/2015   Procedure: CYSTOSCOPY WITH  right URETEROSCOPY, STONE BASKETRY bilateral pylegram  removal bilateral double j stents;  Surgeon: Franchot Gallo, MD;  Location: [facility];  Service: Urology;  Laterality: Bilateral;  . CYSTOSCOPY/RETROGRADE/URETEROSCOPY/STONE EXTRACTION WITH BASKET Right 10/08/2014   Procedure: CYSTOSCOPY, retrograde pyelogram,URETEROSCOPY, laser treatment of calculus, stent exchange;  Surgeon: Jorja Loa, MD;  Location: [facility];  Service: Urology;  Laterality: Right;  . HOLMIUM LASER APPLICATION Left 74/07/4495   Procedure: HOLMIUM LASER APPLICATION;  Surgeon: Jorja Loa, MD;  Location: [facility];  Service: Urology;  Laterality: Left;  . HOLMIUM LASER APPLICATION Right 05/23/9162   Procedure: HOLMIUM LASER APPLICATION;  Surgeon: Franchot Gallo, MD;  Location: [facility];  Service: Urology;  Laterality: Right;  . KNEE ARTHROSCOPY Right 2011  . MENISCUS REPAIR Left 07/2000  . NEPHROLITHOTOMY Right 06/04/2013   Procedure: NEPHROLITHOTOMY PERCUTANEOUS;  Surgeon: Franchot Gallo, MD;  Location: [facility];  Service: Urology;  Laterality: Right;  . removal of growth  2007   growth under kidney removed and possibly kindey stones removed  . TOTAL SHOULDER ARTHROPLASTY Right 10/2007  . URETERAL STENT PLACEMENT Bilateral    for stenosis; "they've been removed"    Allergies as of 03/04/2017      Reactions   Sulfa Antibiotics Rash   Zocor  [simvastatin] Other (See Comments)   Old allergy. Unsure reaction      Medication List       Accurate as of 03/04/17 11:59 PM. Always use your most recent med list.          acetaminophen 500 MG tablet Commonly known as:  TYLENOL Take 500 mg by mouth at bedtime.   cholecalciferol 1000 units tablet Commonly known as:  VITAMIN D Take 1,000 Units by mouth every morning.   Cranberry 450 MG Tabs Take 1 tablet by mouth daily.   digoxin 0.125 MG tablet Commonly known as:  LANOXIN Take 0.125 mg by mouth every Monday, Wednesday, and Friday.   divalproex 250 MG DR tablet Commonly known as:  DEPAKOTE Take 250 mg by mouth 2 (two) times daily.   feeding supplement Liqd Take 1 Container by mouth 3 (three) times daily between meals.   metoprolol tartrate 25 MG tablet Commonly known as:  LOPRESSOR Take 0.5 tablets (12.5 mg total) by mouth daily.   mirtazapine 15 MG tablet Commonly known as:  REMERON Take 15 mg by mouth at bedtime.   NAMZARIC 28-10 MG Cp24 Generic drug:  Memantine HCl-Donepezil HCl Take 1 capsule by mouth each night  TO REPLACE DONEPEZIL   Omega-3 1000 MG Caps Take 1 capsule by mouth every morning.   polyethylene glycol packet Commonly known as:  MIRALAX / GLYCOLAX Take 17 g by mouth daily. Hold for diarrhea   senna-docusate 8.6-50 MG tablet Commonly known as:  Senokot-S Take 1 tablet by mouth 2 (two) times daily.   vitamin C 500 MG tablet Commonly known as:  ASCORBIC ACID Take 500 mg by mouth every morning.   XARELTO 20 MG Tabs tablet Generic drug:  rivaroxaban Take 20 mg by mouth at bedtime.       No orders of the defined types were placed in this encounter.   Immunization History  Administered Date(s) Administered  . Influenza Split 09/16/2014  . PPD Test 01/28/2017  . Pneumococcal-Unspecified 10/07/2013    Social History  Substance Use Topics  . Smoking status: Former Smoker    Packs/day: 0.25    Years: 5.00    Types: Cigarettes     Quit date: 07/20/1981  . Smokeless tobacco: Never Used  . Alcohol use No    Review of Systems  DATA OBTAINED: from patient, nurse, medical record, family member GENERAL:  no fevers, fatigue, appetite changes SKIN: No itching, rash HEENT: No complaint RESPIRATORY: No cough, wheezing, SOB CARDIAC: No chest pain, palpitations, lower extremity edema  GI: No abdominal pain, No N/V/D or constipation, No heartburn or reflux  GU: No dysuria, frequency or urgency, or incontinence  MUSCULOSKELETAL: No unrelieved bone/joint pain NEUROLOGIC: No headache, dizziness  PSYCHIATRIC: No overt anxiety or sadness  Vitals:   03/04/17 1200  BP: 90/73  Pulse: 90  Resp: 20  Temp: (!) 96.5 F (35.8 C)   Body mass index is 18.01 kg/m. Physical Exam  GENERAL APPEARANCE: Alert, conversant, No acute distress  SKIN: No diaphoresis rash HEENT: Unremarkable RESPIRATORY: Breathing is even, unlabored. Lung sounds are clear  CARDIOVASCULAR: Heart RRR no murmurs, rubs or gallops. No peripheral edema  GASTROINTESTINAL: Abdomen is soft, non-tender, not distended w/ normal bowel sounds.  GENITOURINARY: Bladder non tender, not distended  MUSCULOSKELETAL: No abnormal joints or musculature NEUROLOGIC: Cranial nerves 2-12 grossly intact. Moves all extremities PSYCHIATRIC: Mood and affect appropriate to situation, no behavioral issues  Patient Active Problem List   Diagnosis Date Noted  . Elevated TSH 02/03/2017  . Physical deconditioning   . Protein-calorie malnutrition, severe 01/25/2017  . Obstructive uropathy   . Renal mass   . Lactic acidosis 01/23/2017  . Diastolic heart failure ([place]) 01/23/2017  . Kidney stone 07/21/2015  . Calculus of ureter 06/24/2015  . Kidney stones 10/07/2014  . Renal calculus, bilateral 08/26/2014  . Severe sepsis(995.92) 07/15/2014  . Bacteremia due to Gram-negative bacteria 07/14/2014  . Septic shock ([place]) 07/12/2014  . Acute respiratory failure ([place]) 07/12/2014  .  Hypokalemia 07/12/2014  . Pyelonephritis 07/12/2014  . Left humeral fracture 05/19/2014  . Dementia with behavioral disturbance 04/05/2014  . Acute blood loss anemia 04/05/2014  . Hypertension 04/05/2014  . Depression 04/05/2014  . Humerus fracture 03/31/2014    Class: Acute  . Syncope 03/31/2014  . Bradycardia 01/14/2013  . E. coli UTI 01/13/2013    Class: Acute  . Syncope and collapse 05/28/2012  . Weight loss, unintentional 03/04/2012  . Inguinal hernia 09/25/2011  . Dizziness - light-headed 06/27/2011  . Malaise and fatigue 03/23/2011  . Right bundle branch block 03/23/2011  . Hypercholesterolemia 03/23/2011  . Atrial fibrillation ([place]) 02/26/2011  . Long term current use of anticoagulant 02/26/2011    CMP     Component Value Date/Time   NA 143 02/01/2017   K 3.7 02/01/2017   CL 105 01/28/2017 0513   CO2 28 01/28/2017 0513   GLUCOSE 80 01/28/2017 0513   BUN 24 (A) 02/01/2017   CREATININE 0.8 02/01/2017   CREATININE 0.75 01/28/2017 0513   CREATININE 0.84 02/29/2016 1435   CALCIUM 10.1 01/28/2017 0513   PROT 6.4 (L) 01/23/2017 1519   ALBUMIN 3.0 (L) 01/23/2017 1519   AST 25 01/23/2017 1519   ALT <5 (L) 01/23/2017 1519   ALKPHOS 68 01/23/2017 1519   BILITOT 1.4 (H) 01/23/2017 1519   GFRNONAA >60 01/28/2017 0513   GFRAA >60 01/28/2017 0513    Recent Labs  01/23/17 1519 01/23/17 2307  01/24/17 0600  01/25/17 0600 01/28/17 01/28/17 0513 02/01/17  NA 144  --   < > 145  < > 144 142 142 143  K 4.1  --   < > 3.2*  < > 3.9  --  3.6 3.7  CL 106  --   --  113*  --  114*  --  105  --   CO2 30  --   --  24  --  25  --  28  --   GLUCOSE 202*  --   --  90  --  81  --  80  --   BUN 19  --   < > 15  < > 18 17 17  24*  CREATININE 0.93  --   < > 0.68  < > 0.79 0.8 0.75 0.8  CALCIUM 9.6  --   --  9.0  --  9.1  --  10.1  --   MG 1.8 1.7  --   --   --  1.6*  --   --   --   PHOS  --  2.7  --   --   --   --   --   --   --   < > =  values in this interval not  displayed.  Recent Labs  01/23/17 1519  AST 25  ALT <5*  ALKPHOS 68  BILITOT 1.4*  PROT 6.4*  ALBUMIN 3.0*    Recent Labs  01/23/17 1519  01/24/17 0600  01/25/17 0600 01/28/17 01/28/17 0513 02/01/17  WBC 9.0  < > 8.5  < > 6.3 6.5 6.5 7.9  7.9  NEUTROABS 7.7  --   --   --   --   --   --   --   HGB 12.5  < > 10.7*  < > 10.4*  --  12.2 13.1  13.1  HCT 39.5  < > 32.7*  < > 32.3*  --  38.4 41  41  MCV 95.4  --  94.5  --  94.7  --  93.4  --   PLT 202  < > 171  < > 162  --  181 205  205  < > = values in this interval not displayed. No results for input(s): CHOL, LDLCALC, TRIG in the last 8760 hours.  Invalid input(s): HCL No results found for: [facility] Lab Results  Component Value Date   TSH 5.133 (H) 01/23/2017   No results found for: HGBA1C Lab Results  Component Value Date   CHOL 166 02/26/2011   HDL 74.20 02/26/2011   LDLCALC 79 02/26/2011   TRIG 63.0 02/26/2011   CHOLHDL 2 02/26/2011    Significant Diagnostic Results in last 30 days:  No results found.  Assessment and Plan  No problem-specific Assessment & Plan notes found for this encounter.   Labs/tests ordered:    Noah Delaine. Sheppard Coil, MD

## 2017-03-19 DEATH — deceased

## 2017-04-22 NOTE — Anesthesia Postprocedure Evaluation (Signed)
Anesthesia Post Note  Patient: Penny [name]  Procedure(s) Performed: Procedure(s) (LRB): CYSTOSCOPY WITH LEFT  RETROGRADE PYELOGRAM/URETERAL STENT PLACEMENT (Left)     Anesthesia Post Evaluation  Last Vitals:  Vitals:   01/27/17 2104 01/28/17 0506  BP: (!) 151/62 136/74  Pulse: 79 81  Resp: 18 18  Temp: 36.7 C 36.6 C    Last Pain:  Vitals:   01/28/17 0740  TempSrc:   PainSc: 0-No pain                 Makailyn Mccormick S

## 2017-04-22 NOTE — Addendum Note (Signed)
Addendum  created 04/22/17 1214 by Myrtie Soman, MD   Sign clinical note

## 2017-12-01 NOTE — Progress Notes (Signed)
This encounter was created in error - please disregard.
